# Patient Record
Sex: Female | Born: 1955
Health system: Southern US, Community
[De-identification: ages and names within clinical notes are randomized; demographics above are authoritative.]

## PROBLEM LIST (undated history)

## (undated) DIAGNOSIS — R011 Cardiac murmur, unspecified: Secondary | ICD-10-CM

## (undated) DIAGNOSIS — M1990 Unspecified osteoarthritis, unspecified site: Secondary | ICD-10-CM

## (undated) DIAGNOSIS — G2581 Restless legs syndrome: Secondary | ICD-10-CM

## (undated) DIAGNOSIS — M797 Fibromyalgia: Secondary | ICD-10-CM

## (undated) DIAGNOSIS — F32A Depression, unspecified: Secondary | ICD-10-CM

## (undated) DIAGNOSIS — Q279 Congenital malformation of peripheral vascular system, unspecified: Secondary | ICD-10-CM

## (undated) DIAGNOSIS — E7800 Pure hypercholesterolemia, unspecified: Secondary | ICD-10-CM

## (undated) DIAGNOSIS — E210 Primary hyperparathyroidism: Secondary | ICD-10-CM

## (undated) DIAGNOSIS — M4800 Spinal stenosis, site unspecified: Secondary | ICD-10-CM

## (undated) DIAGNOSIS — M7062 Trochanteric bursitis, left hip: Secondary | ICD-10-CM

## (undated) DIAGNOSIS — M47899 Other spondylosis, site unspecified: Secondary | ICD-10-CM

## (undated) DIAGNOSIS — M7061 Trochanteric bursitis, right hip: Secondary | ICD-10-CM

## (undated) DIAGNOSIS — E079 Disorder of thyroid, unspecified: Secondary | ICD-10-CM

## (undated) DIAGNOSIS — K589 Irritable bowel syndrome without diarrhea: Secondary | ICD-10-CM

## (undated) DIAGNOSIS — M810 Age-related osteoporosis without current pathological fracture: Secondary | ICD-10-CM

## (undated) DIAGNOSIS — J449 Chronic obstructive pulmonary disease, unspecified: Secondary | ICD-10-CM

## (undated) DIAGNOSIS — I639 Cerebral infarction, unspecified: Secondary | ICD-10-CM

## (undated) DIAGNOSIS — M5416 Radiculopathy, lumbar region: Secondary | ICD-10-CM

## (undated) DIAGNOSIS — K219 Gastro-esophageal reflux disease without esophagitis: Secondary | ICD-10-CM

## (undated) DIAGNOSIS — G5600 Carpal tunnel syndrome, unspecified upper limb: Secondary | ICD-10-CM

## (undated) DIAGNOSIS — M26609 Unspecified temporomandibular joint disorder, unspecified side: Secondary | ICD-10-CM

## (undated) DIAGNOSIS — K5909 Other constipation: Secondary | ICD-10-CM

## (undated) DIAGNOSIS — M47819 Spondylosis without myelopathy or radiculopathy, site unspecified: Secondary | ICD-10-CM

## (undated) DIAGNOSIS — F419 Anxiety disorder, unspecified: Secondary | ICD-10-CM

## (undated) DIAGNOSIS — E2740 Unspecified adrenocortical insufficiency: Secondary | ICD-10-CM

## (undated) HISTORY — DX: Trochanteric bursitis, right hip: M70.62

## (undated) HISTORY — DX: Gastro-esophageal reflux disease without esophagitis: K21.9

## (undated) HISTORY — PX: CHOLECYSTECTOMY: SHX55

## (undated) HISTORY — DX: Unspecified adrenocortical insufficiency: E27.40

## (undated) HISTORY — DX: Fibromyalgia: M79.7

## (undated) HISTORY — DX: Unspecified osteoarthritis, unspecified site: M19.90

## (undated) HISTORY — PX: CATARACT EXTRACTION: SUR2

## (undated) HISTORY — DX: Pure hypercholesterolemia, unspecified: E78.00

## (undated) HISTORY — DX: Congenital malformation of peripheral vascular system, unspecified: Q27.9

## (undated) HISTORY — DX: Chronic obstructive pulmonary disease, unspecified: J44.9

## (undated) HISTORY — PX: TONSILLECTOMY: SUR1361

## (undated) HISTORY — DX: Carpal tunnel syndrome, unspecified upper limb: G56.00

## (undated) HISTORY — DX: Radiculopathy, lumbar region: M54.16

## (undated) HISTORY — DX: Restless legs syndrome: G25.81

## (undated) HISTORY — PX: NECK SURGERY: SHX720

## (undated) HISTORY — DX: Spondylosis without myelopathy or radiculopathy, site unspecified: M47.819

## (undated) HISTORY — DX: Spinal stenosis, site unspecified: M48.00

## (undated) HISTORY — DX: Disorder of thyroid, unspecified: E07.9

## (undated) HISTORY — PX: TOTAL ABDOMINAL HYSTERECTOMY: SHX209

## (undated) HISTORY — DX: Other constipation: K59.09

## (undated) HISTORY — DX: Irritable bowel syndrome, unspecified: K58.9

## (undated) HISTORY — DX: Cerebral infarction, unspecified: I63.9

## (undated) HISTORY — DX: Trochanteric bursitis, right hip: M70.61

## (undated) HISTORY — DX: Unspecified temporomandibular joint disorder, unspecified side: M26.609

## (undated) HISTORY — DX: Age-related osteoporosis without current pathological fracture: M81.0

## (undated) HISTORY — DX: Cardiac murmur, unspecified: R01.1

## (undated) HISTORY — PX: APPENDECTOMY: SHX54

## (undated) HISTORY — DX: Other spondylosis, site unspecified: M47.899

## (undated) HISTORY — DX: Anxiety disorder, unspecified: F41.9

## (undated) HISTORY — DX: Primary hyperparathyroidism: E21.0

## (undated) HISTORY — PX: HEMORROIDECTOMY: SUR656

---

## 2002-03-10 ENCOUNTER — Encounter: Admission: RE | Admit: 2002-03-10 | Discharge: 2002-03-10 | Payer: Self-pay | Admitting: Unknown Physician Specialty

## 2002-03-10 ENCOUNTER — Encounter: Payer: Self-pay | Admitting: Unknown Physician Specialty

## 2002-10-24 ENCOUNTER — Encounter: Payer: Self-pay | Admitting: Unknown Physician Specialty

## 2002-10-24 ENCOUNTER — Encounter: Admission: RE | Admit: 2002-10-24 | Discharge: 2002-10-24 | Payer: Self-pay | Admitting: Unknown Physician Specialty

## 2004-04-24 ENCOUNTER — Ambulatory Visit (HOSPITAL_COMMUNITY): Admission: RE | Admit: 2004-04-24 | Discharge: 2004-04-25 | Payer: Self-pay | Admitting: Neurosurgery

## 2004-04-24 HISTORY — PX: OTHER SURGICAL HISTORY: SHX169

## 2004-05-04 ENCOUNTER — Encounter: Admission: RE | Admit: 2004-05-04 | Discharge: 2004-05-04 | Payer: Self-pay | Admitting: Neurosurgery

## 2004-06-08 ENCOUNTER — Encounter: Admission: RE | Admit: 2004-06-08 | Discharge: 2004-06-08 | Payer: Self-pay | Admitting: Neurosurgery

## 2004-06-29 ENCOUNTER — Encounter: Admission: RE | Admit: 2004-06-29 | Discharge: 2004-06-29 | Payer: Self-pay | Admitting: Neurosurgery

## 2004-07-13 ENCOUNTER — Encounter: Admission: RE | Admit: 2004-07-13 | Discharge: 2004-07-13 | Payer: Self-pay | Admitting: Neurosurgery

## 2004-11-06 ENCOUNTER — Encounter: Admission: RE | Admit: 2004-11-06 | Discharge: 2004-11-06 | Payer: Self-pay | Admitting: Neurosurgery

## 2005-01-03 ENCOUNTER — Ambulatory Visit (HOSPITAL_COMMUNITY): Admission: RE | Admit: 2005-01-03 | Discharge: 2005-01-03 | Payer: Self-pay | Admitting: Neurosurgery

## 2005-01-03 HISTORY — PX: LAMINECTOMY AND MICRODISCECTOMY LUMBAR SPINE: SHX1913

## 2005-03-02 ENCOUNTER — Observation Stay (HOSPITAL_COMMUNITY): Admission: RE | Admit: 2005-03-02 | Discharge: 2005-03-02 | Payer: Self-pay | Admitting: Neurosurgery

## 2005-04-12 ENCOUNTER — Ambulatory Visit (HOSPITAL_COMMUNITY): Admission: RE | Admit: 2005-04-12 | Discharge: 2005-04-12 | Payer: Self-pay | Admitting: Neurosurgery

## 2005-07-18 ENCOUNTER — Encounter: Admission: RE | Admit: 2005-07-18 | Discharge: 2005-07-18 | Payer: Self-pay | Admitting: Neurosurgery

## 2005-08-02 ENCOUNTER — Ambulatory Visit (HOSPITAL_COMMUNITY): Admission: RE | Admit: 2005-08-02 | Discharge: 2005-08-02 | Payer: Self-pay | Admitting: Neurosurgery

## 2006-02-16 IMAGING — CR DG CHEST 2V
2 series · 2 of 2 positions shown · non-contrast
Comparison: none

CLINICAL DATA: HNP.  Pre-op respiratory exam.  Dyspnea, COPD, cough. 
 CHEST - TWO VIEW:
 No comparison films available. 
 Mild peribronchial thickening is likely chronic.  The lungs are otherwise clear.  The cardiomediastinal silhouette is within normal limits.

[view not recorded (1 of 2)]
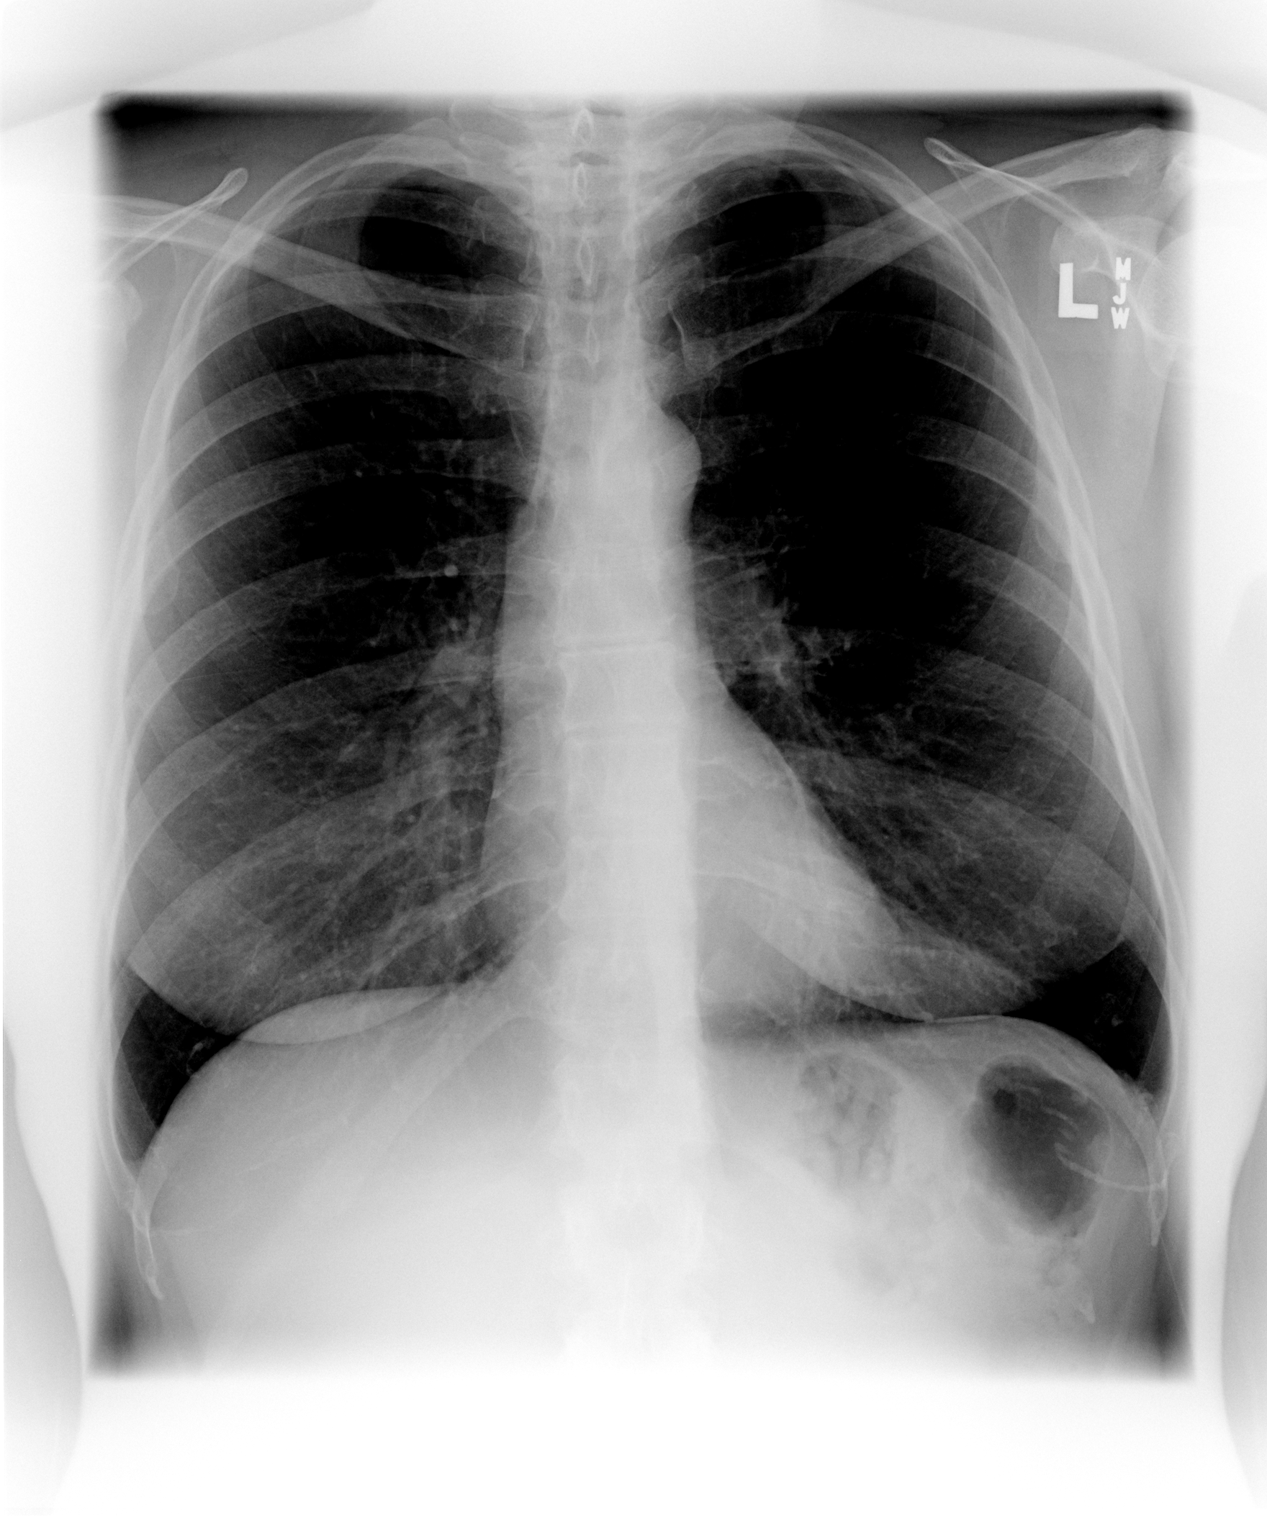

[view not recorded (2 of 2)]
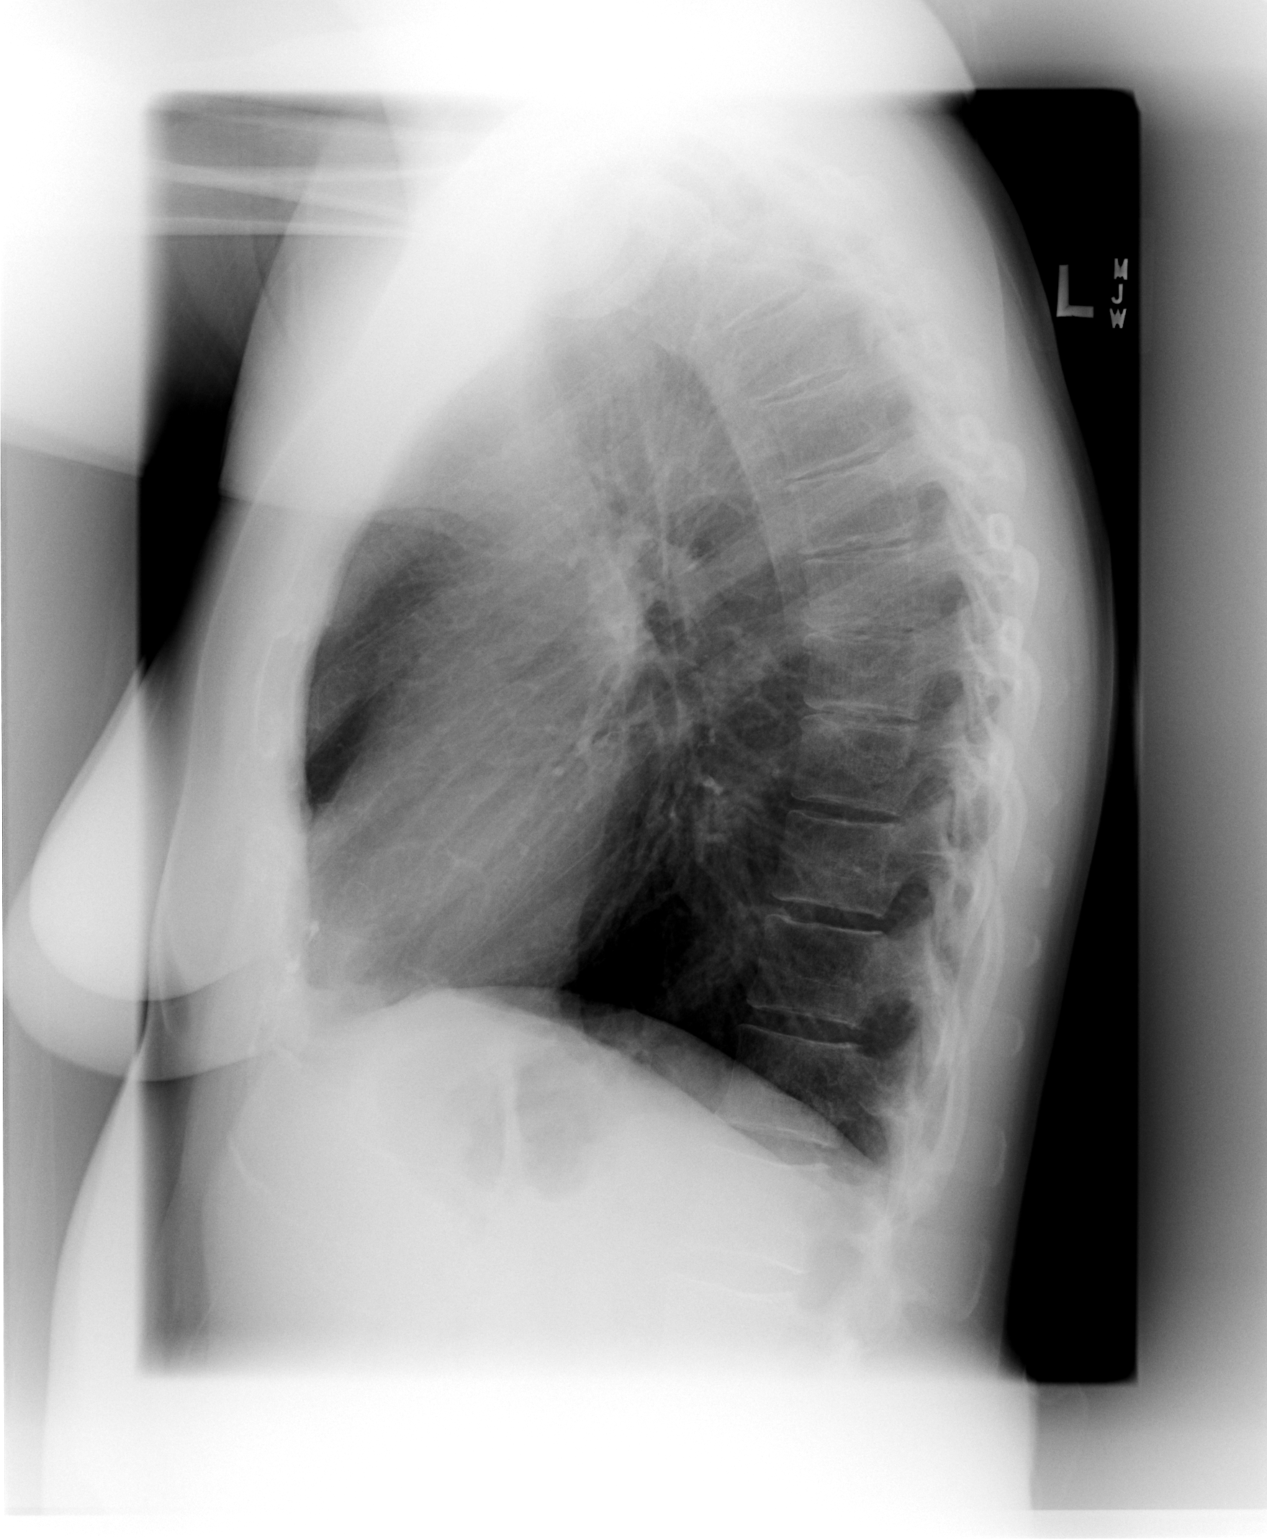

[2 of 2 positions shown; findings below may reference images not displayed]

IMPRESSION: Mild peribronchial thickening.

## 2007-04-28 HISTORY — PX: ESOPHAGOGASTRODUODENOSCOPY: SHX1529

## 2012-11-01 DIAGNOSIS — H539 Unspecified visual disturbance: Secondary | ICD-10-CM | POA: Insufficient documentation

## 2012-11-01 DIAGNOSIS — R0600 Dyspnea, unspecified: Secondary | ICD-10-CM | POA: Insufficient documentation

## 2012-11-01 DIAGNOSIS — E079 Disorder of thyroid, unspecified: Secondary | ICD-10-CM | POA: Insufficient documentation

## 2012-11-01 DIAGNOSIS — F4542 Pain disorder with related psychological factors: Secondary | ICD-10-CM | POA: Insufficient documentation

## 2012-11-01 DIAGNOSIS — M67919 Unspecified disorder of synovium and tendon, unspecified shoulder: Secondary | ICD-10-CM | POA: Insufficient documentation

## 2012-11-21 DIAGNOSIS — M545 Low back pain, unspecified: Secondary | ICD-10-CM | POA: Insufficient documentation

## 2013-01-21 DIAGNOSIS — M542 Cervicalgia: Secondary | ICD-10-CM | POA: Insufficient documentation

## 2013-03-19 DIAGNOSIS — M961 Postlaminectomy syndrome, not elsewhere classified: Secondary | ICD-10-CM | POA: Insufficient documentation

## 2013-06-04 DIAGNOSIS — G5602 Carpal tunnel syndrome, left upper limb: Secondary | ICD-10-CM | POA: Insufficient documentation

## 2013-08-06 DIAGNOSIS — F411 Generalized anxiety disorder: Secondary | ICD-10-CM | POA: Insufficient documentation

## 2015-02-18 DIAGNOSIS — Z0000 Encounter for general adult medical examination without abnormal findings: Secondary | ICD-10-CM | POA: Diagnosis not present

## 2015-02-18 DIAGNOSIS — Z1211 Encounter for screening for malignant neoplasm of colon: Secondary | ICD-10-CM | POA: Diagnosis not present

## 2015-02-18 DIAGNOSIS — Z1231 Encounter for screening mammogram for malignant neoplasm of breast: Secondary | ICD-10-CM | POA: Diagnosis not present

## 2015-02-18 DIAGNOSIS — Z23 Encounter for immunization: Secondary | ICD-10-CM | POA: Diagnosis not present

## 2015-03-24 DIAGNOSIS — G8929 Other chronic pain: Secondary | ICD-10-CM | POA: Diagnosis not present

## 2015-03-24 DIAGNOSIS — G894 Chronic pain syndrome: Secondary | ICD-10-CM | POA: Diagnosis not present

## 2015-03-24 DIAGNOSIS — M961 Postlaminectomy syndrome, not elsewhere classified: Secondary | ICD-10-CM | POA: Diagnosis not present

## 2015-03-24 DIAGNOSIS — R8299 Other abnormal findings in urine: Secondary | ICD-10-CM | POA: Diagnosis not present

## 2015-03-24 DIAGNOSIS — E034 Atrophy of thyroid (acquired): Secondary | ICD-10-CM | POA: Diagnosis not present

## 2015-03-24 DIAGNOSIS — R7301 Impaired fasting glucose: Secondary | ICD-10-CM | POA: Diagnosis not present

## 2015-03-24 DIAGNOSIS — J449 Chronic obstructive pulmonary disease, unspecified: Secondary | ICD-10-CM | POA: Diagnosis not present

## 2015-03-24 DIAGNOSIS — M545 Low back pain: Secondary | ICD-10-CM | POA: Diagnosis not present

## 2015-03-24 DIAGNOSIS — E7800 Pure hypercholesterolemia, unspecified: Secondary | ICD-10-CM | POA: Diagnosis not present

## 2015-04-07 DIAGNOSIS — R7301 Impaired fasting glucose: Secondary | ICD-10-CM | POA: Diagnosis not present

## 2015-04-07 DIAGNOSIS — E034 Atrophy of thyroid (acquired): Secondary | ICD-10-CM | POA: Diagnosis not present

## 2015-04-07 DIAGNOSIS — K1379 Other lesions of oral mucosa: Secondary | ICD-10-CM | POA: Diagnosis not present

## 2015-04-07 DIAGNOSIS — E782 Mixed hyperlipidemia: Secondary | ICD-10-CM | POA: Diagnosis not present

## 2015-06-15 DIAGNOSIS — J018 Other acute sinusitis: Secondary | ICD-10-CM | POA: Diagnosis not present

## 2015-06-29 DIAGNOSIS — M719 Bursopathy, unspecified: Secondary | ICD-10-CM | POA: Diagnosis not present

## 2015-06-29 DIAGNOSIS — M961 Postlaminectomy syndrome, not elsewhere classified: Secondary | ICD-10-CM | POA: Diagnosis not present

## 2015-06-29 DIAGNOSIS — M67919 Unspecified disorder of synovium and tendon, unspecified shoulder: Secondary | ICD-10-CM | POA: Diagnosis not present

## 2015-06-29 DIAGNOSIS — G894 Chronic pain syndrome: Secondary | ICD-10-CM | POA: Diagnosis not present

## 2015-07-21 DIAGNOSIS — R7301 Impaired fasting glucose: Secondary | ICD-10-CM | POA: Diagnosis not present

## 2015-07-21 DIAGNOSIS — M797 Fibromyalgia: Secondary | ICD-10-CM | POA: Diagnosis not present

## 2015-07-21 DIAGNOSIS — E782 Mixed hyperlipidemia: Secondary | ICD-10-CM | POA: Diagnosis not present

## 2015-07-21 DIAGNOSIS — J410 Simple chronic bronchitis: Secondary | ICD-10-CM | POA: Diagnosis not present

## 2015-07-21 DIAGNOSIS — M791 Myalgia: Secondary | ICD-10-CM | POA: Diagnosis not present

## 2015-07-21 DIAGNOSIS — I10 Essential (primary) hypertension: Secondary | ICD-10-CM | POA: Diagnosis not present

## 2015-09-15 DIAGNOSIS — Z1231 Encounter for screening mammogram for malignant neoplasm of breast: Secondary | ICD-10-CM | POA: Diagnosis not present

## 2015-09-22 DIAGNOSIS — E2740 Unspecified adrenocortical insufficiency: Secondary | ICD-10-CM | POA: Insufficient documentation

## 2015-09-22 DIAGNOSIS — S51811A Laceration without foreign body of right forearm, initial encounter: Secondary | ICD-10-CM | POA: Diagnosis not present

## 2015-09-22 DIAGNOSIS — E039 Hypothyroidism, unspecified: Secondary | ICD-10-CM | POA: Insufficient documentation

## 2015-09-22 DIAGNOSIS — E559 Vitamin D deficiency, unspecified: Secondary | ICD-10-CM | POA: Insufficient documentation

## 2015-09-22 DIAGNOSIS — E213 Hyperparathyroidism, unspecified: Secondary | ICD-10-CM | POA: Insufficient documentation

## 2015-09-23 DIAGNOSIS — E2740 Unspecified adrenocortical insufficiency: Secondary | ICD-10-CM | POA: Diagnosis not present

## 2015-09-23 DIAGNOSIS — E559 Vitamin D deficiency, unspecified: Secondary | ICD-10-CM | POA: Diagnosis not present

## 2015-09-23 DIAGNOSIS — E039 Hypothyroidism, unspecified: Secondary | ICD-10-CM | POA: Diagnosis not present

## 2015-09-23 DIAGNOSIS — E034 Atrophy of thyroid (acquired): Secondary | ICD-10-CM | POA: Diagnosis not present

## 2015-09-23 DIAGNOSIS — E213 Hyperparathyroidism, unspecified: Secondary | ICD-10-CM | POA: Diagnosis not present

## 2015-10-05 DIAGNOSIS — G894 Chronic pain syndrome: Secondary | ICD-10-CM | POA: Diagnosis not present

## 2015-10-05 DIAGNOSIS — M67919 Unspecified disorder of synovium and tendon, unspecified shoulder: Secondary | ICD-10-CM | POA: Diagnosis not present

## 2015-10-05 DIAGNOSIS — M719 Bursopathy, unspecified: Secondary | ICD-10-CM | POA: Diagnosis not present

## 2015-10-05 DIAGNOSIS — M545 Low back pain: Secondary | ICD-10-CM | POA: Diagnosis not present

## 2015-10-05 DIAGNOSIS — M961 Postlaminectomy syndrome, not elsewhere classified: Secondary | ICD-10-CM | POA: Diagnosis not present

## 2015-11-14 DIAGNOSIS — H5203 Hypermetropia, bilateral: Secondary | ICD-10-CM | POA: Diagnosis not present

## 2015-11-17 DIAGNOSIS — S335XXA Sprain of ligaments of lumbar spine, initial encounter: Secondary | ICD-10-CM | POA: Diagnosis not present

## 2015-11-17 DIAGNOSIS — M545 Low back pain: Secondary | ICD-10-CM | POA: Diagnosis not present

## 2015-11-17 DIAGNOSIS — S3992XA Unspecified injury of lower back, initial encounter: Secondary | ICD-10-CM | POA: Diagnosis not present

## 2015-12-01 DIAGNOSIS — K921 Melena: Secondary | ICD-10-CM | POA: Diagnosis not present

## 2015-12-01 DIAGNOSIS — R1033 Periumbilical pain: Secondary | ICD-10-CM | POA: Diagnosis not present

## 2015-12-01 DIAGNOSIS — R10811 Right upper quadrant abdominal tenderness: Secondary | ICD-10-CM | POA: Diagnosis not present

## 2015-12-01 DIAGNOSIS — R109 Unspecified abdominal pain: Secondary | ICD-10-CM | POA: Diagnosis not present

## 2015-12-01 DIAGNOSIS — R1012 Left upper quadrant pain: Secondary | ICD-10-CM | POA: Diagnosis not present

## 2015-12-01 DIAGNOSIS — M545 Low back pain: Secondary | ICD-10-CM | POA: Diagnosis not present

## 2015-12-02 DIAGNOSIS — R109 Unspecified abdominal pain: Secondary | ICD-10-CM | POA: Diagnosis not present

## 2015-12-12 DIAGNOSIS — R42 Dizziness and giddiness: Secondary | ICD-10-CM | POA: Diagnosis not present

## 2015-12-12 DIAGNOSIS — R1012 Left upper quadrant pain: Secondary | ICD-10-CM | POA: Diagnosis not present

## 2015-12-13 DIAGNOSIS — H6992 Unspecified Eustachian tube disorder, left ear: Secondary | ICD-10-CM | POA: Diagnosis not present

## 2015-12-13 DIAGNOSIS — H9202 Otalgia, left ear: Secondary | ICD-10-CM | POA: Diagnosis not present

## 2015-12-13 DIAGNOSIS — H90A32 Mixed conductive and sensorineural hearing loss, unilateral, left ear with restricted hearing on the contralateral side: Secondary | ICD-10-CM | POA: Diagnosis not present

## 2016-01-05 DIAGNOSIS — M545 Low back pain: Secondary | ICD-10-CM | POA: Diagnosis not present

## 2016-01-05 DIAGNOSIS — M719 Bursopathy, unspecified: Secondary | ICD-10-CM | POA: Diagnosis not present

## 2016-01-05 DIAGNOSIS — G5600 Carpal tunnel syndrome, unspecified upper limb: Secondary | ICD-10-CM | POA: Diagnosis not present

## 2016-01-05 DIAGNOSIS — M961 Postlaminectomy syndrome, not elsewhere classified: Secondary | ICD-10-CM | POA: Diagnosis not present

## 2016-01-05 DIAGNOSIS — G894 Chronic pain syndrome: Secondary | ICD-10-CM | POA: Diagnosis not present

## 2016-01-11 DIAGNOSIS — H9202 Otalgia, left ear: Secondary | ICD-10-CM | POA: Diagnosis not present

## 2016-01-11 DIAGNOSIS — H90A32 Mixed conductive and sensorineural hearing loss, unilateral, left ear with restricted hearing on the contralateral side: Secondary | ICD-10-CM | POA: Diagnosis not present

## 2016-01-11 DIAGNOSIS — H6992 Unspecified Eustachian tube disorder, left ear: Secondary | ICD-10-CM | POA: Diagnosis not present

## 2016-01-12 DIAGNOSIS — R1013 Epigastric pain: Secondary | ICD-10-CM | POA: Diagnosis not present

## 2016-03-15 DIAGNOSIS — J410 Simple chronic bronchitis: Secondary | ICD-10-CM | POA: Diagnosis not present

## 2016-03-15 DIAGNOSIS — M797 Fibromyalgia: Secondary | ICD-10-CM | POA: Diagnosis not present

## 2016-03-15 DIAGNOSIS — M25511 Pain in right shoulder: Secondary | ICD-10-CM | POA: Diagnosis not present

## 2016-03-15 DIAGNOSIS — R7301 Impaired fasting glucose: Secondary | ICD-10-CM | POA: Diagnosis not present

## 2016-03-15 DIAGNOSIS — E782 Mixed hyperlipidemia: Secondary | ICD-10-CM | POA: Diagnosis not present

## 2016-04-05 DIAGNOSIS — M961 Postlaminectomy syndrome, not elsewhere classified: Secondary | ICD-10-CM | POA: Diagnosis not present

## 2016-04-05 DIAGNOSIS — G894 Chronic pain syndrome: Secondary | ICD-10-CM | POA: Diagnosis not present

## 2016-04-05 DIAGNOSIS — M542 Cervicalgia: Secondary | ICD-10-CM | POA: Diagnosis not present

## 2016-04-05 DIAGNOSIS — M719 Bursopathy, unspecified: Secondary | ICD-10-CM | POA: Diagnosis not present

## 2016-04-05 DIAGNOSIS — M545 Low back pain: Secondary | ICD-10-CM | POA: Diagnosis not present

## 2016-04-10 DIAGNOSIS — M25511 Pain in right shoulder: Secondary | ICD-10-CM | POA: Diagnosis not present

## 2016-04-10 DIAGNOSIS — M19011 Primary osteoarthritis, right shoulder: Secondary | ICD-10-CM | POA: Diagnosis not present

## 2016-04-10 DIAGNOSIS — S43421A Sprain of right rotator cuff capsule, initial encounter: Secondary | ICD-10-CM | POA: Diagnosis not present

## 2016-04-11 DIAGNOSIS — Z0000 Encounter for general adult medical examination without abnormal findings: Secondary | ICD-10-CM | POA: Diagnosis not present

## 2016-04-11 DIAGNOSIS — N951 Menopausal and female climacteric states: Secondary | ICD-10-CM | POA: Diagnosis not present

## 2016-04-11 DIAGNOSIS — Z1211 Encounter for screening for malignant neoplasm of colon: Secondary | ICD-10-CM | POA: Diagnosis not present

## 2016-04-25 DIAGNOSIS — G5603 Carpal tunnel syndrome, bilateral upper limbs: Secondary | ICD-10-CM | POA: Diagnosis not present

## 2016-04-25 DIAGNOSIS — M7541 Impingement syndrome of right shoulder: Secondary | ICD-10-CM | POA: Diagnosis not present

## 2016-05-03 DIAGNOSIS — H9202 Otalgia, left ear: Secondary | ICD-10-CM | POA: Diagnosis not present

## 2016-05-03 DIAGNOSIS — H6992 Unspecified Eustachian tube disorder, left ear: Secondary | ICD-10-CM | POA: Diagnosis not present

## 2016-05-03 DIAGNOSIS — H60332 Swimmer's ear, left ear: Secondary | ICD-10-CM | POA: Diagnosis not present

## 2016-05-14 DIAGNOSIS — M810 Age-related osteoporosis without current pathological fracture: Secondary | ICD-10-CM | POA: Diagnosis not present

## 2016-05-14 DIAGNOSIS — M818 Other osteoporosis without current pathological fracture: Secondary | ICD-10-CM | POA: Diagnosis not present

## 2016-05-14 DIAGNOSIS — N959 Unspecified menopausal and perimenopausal disorder: Secondary | ICD-10-CM | POA: Diagnosis not present

## 2016-05-14 LAB — HM DEXA SCAN

## 2016-05-15 DIAGNOSIS — H73812 Atrophic flaccid tympanic membrane, left ear: Secondary | ICD-10-CM | POA: Diagnosis not present

## 2016-05-15 DIAGNOSIS — H90A32 Mixed conductive and sensorineural hearing loss, unilateral, left ear with restricted hearing on the contralateral side: Secondary | ICD-10-CM | POA: Diagnosis not present

## 2016-05-15 DIAGNOSIS — H6992 Unspecified Eustachian tube disorder, left ear: Secondary | ICD-10-CM | POA: Diagnosis not present

## 2016-06-21 DIAGNOSIS — E782 Mixed hyperlipidemia: Secondary | ICD-10-CM | POA: Diagnosis not present

## 2016-06-21 DIAGNOSIS — I10 Essential (primary) hypertension: Secondary | ICD-10-CM | POA: Diagnosis not present

## 2016-06-21 DIAGNOSIS — J410 Simple chronic bronchitis: Secondary | ICD-10-CM | POA: Diagnosis not present

## 2016-06-21 DIAGNOSIS — M797 Fibromyalgia: Secondary | ICD-10-CM | POA: Diagnosis not present

## 2016-06-27 DIAGNOSIS — S46011A Strain of muscle(s) and tendon(s) of the rotator cuff of right shoulder, initial encounter: Secondary | ICD-10-CM | POA: Diagnosis not present

## 2016-06-27 DIAGNOSIS — M25511 Pain in right shoulder: Secondary | ICD-10-CM | POA: Diagnosis not present

## 2016-07-05 DIAGNOSIS — M67919 Unspecified disorder of synovium and tendon, unspecified shoulder: Secondary | ICD-10-CM | POA: Diagnosis not present

## 2016-07-05 DIAGNOSIS — M961 Postlaminectomy syndrome, not elsewhere classified: Secondary | ICD-10-CM | POA: Diagnosis not present

## 2016-07-05 DIAGNOSIS — G894 Chronic pain syndrome: Secondary | ICD-10-CM | POA: Diagnosis not present

## 2016-07-05 DIAGNOSIS — M719 Bursopathy, unspecified: Secondary | ICD-10-CM | POA: Diagnosis not present

## 2016-07-09 DIAGNOSIS — M7501 Adhesive capsulitis of right shoulder: Secondary | ICD-10-CM | POA: Diagnosis not present

## 2016-07-09 DIAGNOSIS — G5601 Carpal tunnel syndrome, right upper limb: Secondary | ICD-10-CM | POA: Diagnosis not present

## 2016-09-13 DIAGNOSIS — H90A32 Mixed conductive and sensorineural hearing loss, unilateral, left ear with restricted hearing on the contralateral side: Secondary | ICD-10-CM | POA: Diagnosis not present

## 2016-09-13 DIAGNOSIS — H73812 Atrophic flaccid tympanic membrane, left ear: Secondary | ICD-10-CM | POA: Diagnosis not present

## 2016-09-13 DIAGNOSIS — H6992 Unspecified Eustachian tube disorder, left ear: Secondary | ICD-10-CM | POA: Diagnosis not present

## 2016-09-27 DIAGNOSIS — M545 Low back pain: Secondary | ICD-10-CM | POA: Diagnosis not present

## 2016-09-27 DIAGNOSIS — G5600 Carpal tunnel syndrome, unspecified upper limb: Secondary | ICD-10-CM | POA: Diagnosis not present

## 2016-09-27 DIAGNOSIS — G8929 Other chronic pain: Secondary | ICD-10-CM | POA: Diagnosis not present

## 2016-09-27 DIAGNOSIS — G894 Chronic pain syndrome: Secondary | ICD-10-CM | POA: Diagnosis not present

## 2016-09-27 DIAGNOSIS — M961 Postlaminectomy syndrome, not elsewhere classified: Secondary | ICD-10-CM | POA: Diagnosis not present

## 2016-10-04 DIAGNOSIS — Z09 Encounter for follow-up examination after completed treatment for conditions other than malignant neoplasm: Secondary | ICD-10-CM | POA: Diagnosis not present

## 2016-10-17 DIAGNOSIS — E782 Mixed hyperlipidemia: Secondary | ICD-10-CM | POA: Diagnosis not present

## 2016-10-17 DIAGNOSIS — E038 Other specified hypothyroidism: Secondary | ICD-10-CM | POA: Diagnosis not present

## 2016-10-17 DIAGNOSIS — R7301 Impaired fasting glucose: Secondary | ICD-10-CM | POA: Diagnosis not present

## 2016-10-17 DIAGNOSIS — K580 Irritable bowel syndrome with diarrhea: Secondary | ICD-10-CM | POA: Diagnosis not present

## 2016-10-17 DIAGNOSIS — M810 Age-related osteoporosis without current pathological fracture: Secondary | ICD-10-CM | POA: Diagnosis not present

## 2016-10-30 DIAGNOSIS — G5601 Carpal tunnel syndrome, right upper limb: Secondary | ICD-10-CM | POA: Diagnosis not present

## 2016-11-08 DIAGNOSIS — H90A32 Mixed conductive and sensorineural hearing loss, unilateral, left ear with restricted hearing on the contralateral side: Secondary | ICD-10-CM | POA: Diagnosis not present

## 2016-11-08 DIAGNOSIS — H6992 Unspecified Eustachian tube disorder, left ear: Secondary | ICD-10-CM | POA: Diagnosis not present

## 2016-11-14 DIAGNOSIS — K5903 Drug induced constipation: Secondary | ICD-10-CM | POA: Diagnosis not present

## 2016-11-14 DIAGNOSIS — J441 Chronic obstructive pulmonary disease with (acute) exacerbation: Secondary | ICD-10-CM | POA: Diagnosis not present

## 2016-11-14 DIAGNOSIS — R0602 Shortness of breath: Secondary | ICD-10-CM | POA: Diagnosis not present

## 2016-11-14 DIAGNOSIS — R05 Cough: Secondary | ICD-10-CM | POA: Diagnosis not present

## 2016-11-14 DIAGNOSIS — I700 Atherosclerosis of aorta: Secondary | ICD-10-CM | POA: Diagnosis not present

## 2017-01-02 DIAGNOSIS — M67919 Unspecified disorder of synovium and tendon, unspecified shoulder: Secondary | ICD-10-CM | POA: Diagnosis not present

## 2017-01-02 DIAGNOSIS — M719 Bursopathy, unspecified: Secondary | ICD-10-CM | POA: Diagnosis not present

## 2017-01-02 DIAGNOSIS — G894 Chronic pain syndrome: Secondary | ICD-10-CM | POA: Diagnosis not present

## 2017-01-02 DIAGNOSIS — M961 Postlaminectomy syndrome, not elsewhere classified: Secondary | ICD-10-CM | POA: Diagnosis not present

## 2017-01-18 DIAGNOSIS — M810 Age-related osteoporosis without current pathological fracture: Secondary | ICD-10-CM | POA: Insufficient documentation

## 2017-01-18 DIAGNOSIS — E039 Hypothyroidism, unspecified: Secondary | ICD-10-CM | POA: Diagnosis not present

## 2017-01-18 DIAGNOSIS — E559 Vitamin D deficiency, unspecified: Secondary | ICD-10-CM | POA: Diagnosis not present

## 2017-01-18 DIAGNOSIS — E213 Hyperparathyroidism, unspecified: Secondary | ICD-10-CM | POA: Diagnosis not present

## 2017-01-18 DIAGNOSIS — E2740 Unspecified adrenocortical insufficiency: Secondary | ICD-10-CM | POA: Diagnosis not present

## 2017-01-24 DIAGNOSIS — N3000 Acute cystitis without hematuria: Secondary | ICD-10-CM | POA: Diagnosis not present

## 2017-01-24 DIAGNOSIS — R0789 Other chest pain: Secondary | ICD-10-CM | POA: Diagnosis not present

## 2017-01-24 DIAGNOSIS — R350 Frequency of micturition: Secondary | ICD-10-CM | POA: Diagnosis not present

## 2017-02-14 DIAGNOSIS — E782 Mixed hyperlipidemia: Secondary | ICD-10-CM | POA: Diagnosis not present

## 2017-02-19 DIAGNOSIS — R079 Chest pain, unspecified: Secondary | ICD-10-CM | POA: Diagnosis not present

## 2017-02-19 DIAGNOSIS — R0789 Other chest pain: Secondary | ICD-10-CM | POA: Diagnosis not present

## 2017-03-07 LAB — HM MAMMOGRAPHY: HM Mammogram: NORMAL (ref 0–4)

## 2017-03-21 DIAGNOSIS — F331 Major depressive disorder, recurrent, moderate: Secondary | ICD-10-CM | POA: Insufficient documentation

## 2017-03-21 DIAGNOSIS — F418 Other specified anxiety disorders: Secondary | ICD-10-CM | POA: Insufficient documentation

## 2017-03-21 DIAGNOSIS — F330 Major depressive disorder, recurrent, mild: Secondary | ICD-10-CM | POA: Insufficient documentation

## 2017-04-15 DIAGNOSIS — M961 Postlaminectomy syndrome, not elsewhere classified: Secondary | ICD-10-CM | POA: Insufficient documentation

## 2017-04-15 DIAGNOSIS — M12811 Other specific arthropathies, not elsewhere classified, right shoulder: Secondary | ICD-10-CM | POA: Diagnosis not present

## 2017-04-15 DIAGNOSIS — G5603 Carpal tunnel syndrome, bilateral upper limbs: Secondary | ICD-10-CM | POA: Diagnosis not present

## 2017-04-15 DIAGNOSIS — G894 Chronic pain syndrome: Secondary | ICD-10-CM | POA: Insufficient documentation

## 2017-05-01 DIAGNOSIS — Z8673 Personal history of transient ischemic attack (TIA), and cerebral infarction without residual deficits: Secondary | ICD-10-CM | POA: Diagnosis not present

## 2017-05-01 DIAGNOSIS — K623 Rectal prolapse: Secondary | ICD-10-CM | POA: Diagnosis not present

## 2017-05-01 DIAGNOSIS — K644 Residual hemorrhoidal skin tags: Secondary | ICD-10-CM | POA: Diagnosis not present

## 2017-05-01 DIAGNOSIS — E7800 Pure hypercholesterolemia, unspecified: Secondary | ICD-10-CM | POA: Diagnosis not present

## 2017-05-01 DIAGNOSIS — K581 Irritable bowel syndrome with constipation: Secondary | ICD-10-CM | POA: Diagnosis not present

## 2017-05-01 DIAGNOSIS — Z7951 Long term (current) use of inhaled steroids: Secondary | ICD-10-CM | POA: Diagnosis not present

## 2017-05-01 DIAGNOSIS — D128 Benign neoplasm of rectum: Secondary | ICD-10-CM | POA: Diagnosis not present

## 2017-05-01 DIAGNOSIS — Z79891 Long term (current) use of opiate analgesic: Secondary | ICD-10-CM | POA: Diagnosis not present

## 2017-05-01 DIAGNOSIS — Z79899 Other long term (current) drug therapy: Secondary | ICD-10-CM | POA: Diagnosis not present

## 2017-05-01 DIAGNOSIS — E2740 Unspecified adrenocortical insufficiency: Secondary | ICD-10-CM | POA: Diagnosis not present

## 2017-05-01 DIAGNOSIS — E039 Hypothyroidism, unspecified: Secondary | ICD-10-CM | POA: Diagnosis not present

## 2017-05-01 DIAGNOSIS — K648 Other hemorrhoids: Secondary | ICD-10-CM | POA: Diagnosis not present

## 2017-05-01 DIAGNOSIS — K219 Gastro-esophageal reflux disease without esophagitis: Secondary | ICD-10-CM | POA: Diagnosis not present

## 2017-05-01 DIAGNOSIS — K5909 Other constipation: Secondary | ICD-10-CM | POA: Diagnosis not present

## 2017-05-01 DIAGNOSIS — M797 Fibromyalgia: Secondary | ICD-10-CM | POA: Diagnosis not present

## 2017-05-01 DIAGNOSIS — G2581 Restless legs syndrome: Secondary | ICD-10-CM | POA: Diagnosis not present

## 2017-05-01 DIAGNOSIS — K635 Polyp of colon: Secondary | ICD-10-CM | POA: Diagnosis not present

## 2017-05-01 DIAGNOSIS — K5730 Diverticulosis of large intestine without perforation or abscess without bleeding: Secondary | ICD-10-CM | POA: Diagnosis not present

## 2017-05-01 DIAGNOSIS — J449 Chronic obstructive pulmonary disease, unspecified: Secondary | ICD-10-CM | POA: Diagnosis not present

## 2017-05-01 DIAGNOSIS — G894 Chronic pain syndrome: Secondary | ICD-10-CM | POA: Diagnosis not present

## 2017-05-01 DIAGNOSIS — K621 Rectal polyp: Secondary | ICD-10-CM | POA: Diagnosis not present

## 2017-05-01 DIAGNOSIS — D122 Benign neoplasm of ascending colon: Secondary | ICD-10-CM | POA: Diagnosis not present

## 2017-05-06 HISTORY — PX: COLONOSCOPY: SHX174

## 2017-05-09 DIAGNOSIS — Z0000 Encounter for general adult medical examination without abnormal findings: Secondary | ICD-10-CM | POA: Diagnosis not present

## 2017-05-09 DIAGNOSIS — Z6821 Body mass index (BMI) 21.0-21.9, adult: Secondary | ICD-10-CM | POA: Diagnosis not present

## 2017-05-29 DIAGNOSIS — H9313 Tinnitus, bilateral: Secondary | ICD-10-CM | POA: Diagnosis not present

## 2017-05-29 DIAGNOSIS — H903 Sensorineural hearing loss, bilateral: Secondary | ICD-10-CM | POA: Diagnosis not present

## 2017-05-29 DIAGNOSIS — H73812 Atrophic flaccid tympanic membrane, left ear: Secondary | ICD-10-CM | POA: Diagnosis not present

## 2017-07-02 DIAGNOSIS — K580 Irritable bowel syndrome with diarrhea: Secondary | ICD-10-CM | POA: Diagnosis not present

## 2017-07-02 DIAGNOSIS — M810 Age-related osteoporosis without current pathological fracture: Secondary | ICD-10-CM | POA: Diagnosis not present

## 2017-07-02 DIAGNOSIS — N3000 Acute cystitis without hematuria: Secondary | ICD-10-CM | POA: Diagnosis not present

## 2017-07-02 DIAGNOSIS — E038 Other specified hypothyroidism: Secondary | ICD-10-CM | POA: Diagnosis not present

## 2017-07-02 DIAGNOSIS — E782 Mixed hyperlipidemia: Secondary | ICD-10-CM | POA: Diagnosis not present

## 2017-07-02 DIAGNOSIS — R7301 Impaired fasting glucose: Secondary | ICD-10-CM | POA: Diagnosis not present

## 2017-07-15 DIAGNOSIS — M961 Postlaminectomy syndrome, not elsewhere classified: Secondary | ICD-10-CM | POA: Diagnosis not present

## 2017-07-15 DIAGNOSIS — G894 Chronic pain syndrome: Secondary | ICD-10-CM | POA: Diagnosis not present

## 2017-07-15 DIAGNOSIS — G5603 Carpal tunnel syndrome, bilateral upper limbs: Secondary | ICD-10-CM | POA: Diagnosis not present

## 2017-07-31 DIAGNOSIS — H903 Sensorineural hearing loss, bilateral: Secondary | ICD-10-CM | POA: Diagnosis not present

## 2017-07-31 DIAGNOSIS — H9313 Tinnitus, bilateral: Secondary | ICD-10-CM | POA: Diagnosis not present

## 2017-07-31 DIAGNOSIS — H73812 Atrophic flaccid tympanic membrane, left ear: Secondary | ICD-10-CM | POA: Diagnosis not present

## 2017-08-28 ENCOUNTER — Telehealth: Payer: Self-pay | Admitting: Gastroenterology

## 2017-08-29 NOTE — Telephone Encounter (Signed)
Let's get her in for colon (with 2 day prep - Clenpiq and miralax) in April or May 2019. Pl have her go thru the nurse to make sure she meets criterion for [place]

## 2017-08-29 NOTE — Telephone Encounter (Signed)
Dr Lyndel Safe, please advisee.  Thank you.

## 2017-08-30 NOTE — Telephone Encounter (Signed)
I did speak with patient and she indicates that her insurance will only cover 1 colonoscopy per year, saying that she would not be eligible for a repeat scope until December 2019.  She is going to contact your old office to get a copy of the report and then call her insurance to verify her coverage.

## 2017-09-03 NOTE — Telephone Encounter (Signed)
Lets plan for dec then

## 2017-09-04 NOTE — Telephone Encounter (Signed)
Recall in Epic  

## 2017-09-24 DIAGNOSIS — M25531 Pain in right wrist: Secondary | ICD-10-CM | POA: Diagnosis not present

## 2017-10-01 DIAGNOSIS — E038 Other specified hypothyroidism: Secondary | ICD-10-CM | POA: Diagnosis not present

## 2017-10-01 DIAGNOSIS — R7301 Impaired fasting glucose: Secondary | ICD-10-CM | POA: Diagnosis not present

## 2017-10-01 DIAGNOSIS — E210 Primary hyperparathyroidism: Secondary | ICD-10-CM | POA: Diagnosis not present

## 2017-10-01 DIAGNOSIS — E782 Mixed hyperlipidemia: Secondary | ICD-10-CM | POA: Diagnosis not present

## 2017-10-02 DIAGNOSIS — M542 Cervicalgia: Secondary | ICD-10-CM | POA: Diagnosis not present

## 2017-10-02 DIAGNOSIS — G452 Multiple and bilateral precerebral artery syndromes: Secondary | ICD-10-CM | POA: Diagnosis not present

## 2017-10-02 DIAGNOSIS — M25512 Pain in left shoulder: Secondary | ICD-10-CM | POA: Diagnosis not present

## 2017-10-09 DIAGNOSIS — M961 Postlaminectomy syndrome, not elsewhere classified: Secondary | ICD-10-CM | POA: Diagnosis not present

## 2017-10-09 DIAGNOSIS — G894 Chronic pain syndrome: Secondary | ICD-10-CM | POA: Diagnosis not present

## 2017-10-10 DIAGNOSIS — M7501 Adhesive capsulitis of right shoulder: Secondary | ICD-10-CM | POA: Diagnosis not present

## 2017-10-10 DIAGNOSIS — G5601 Carpal tunnel syndrome, right upper limb: Secondary | ICD-10-CM | POA: Diagnosis not present

## 2017-10-17 DIAGNOSIS — G5603 Carpal tunnel syndrome, bilateral upper limbs: Secondary | ICD-10-CM | POA: Diagnosis not present

## 2017-10-22 DIAGNOSIS — J0100 Acute maxillary sinusitis, unspecified: Secondary | ICD-10-CM | POA: Diagnosis not present

## 2017-10-22 DIAGNOSIS — J301 Allergic rhinitis due to pollen: Secondary | ICD-10-CM | POA: Diagnosis not present

## 2017-10-22 DIAGNOSIS — J441 Chronic obstructive pulmonary disease with (acute) exacerbation: Secondary | ICD-10-CM | POA: Diagnosis not present

## 2017-10-28 DIAGNOSIS — M542 Cervicalgia: Secondary | ICD-10-CM | POA: Diagnosis not present

## 2017-10-31 DIAGNOSIS — E782 Mixed hyperlipidemia: Secondary | ICD-10-CM | POA: Diagnosis not present

## 2017-10-31 DIAGNOSIS — M5412 Radiculopathy, cervical region: Secondary | ICD-10-CM | POA: Diagnosis not present

## 2017-10-31 DIAGNOSIS — Z01810 Encounter for preprocedural cardiovascular examination: Secondary | ICD-10-CM | POA: Diagnosis not present

## 2017-10-31 DIAGNOSIS — R0789 Other chest pain: Secondary | ICD-10-CM | POA: Diagnosis not present

## 2017-10-31 DIAGNOSIS — R7301 Impaired fasting glucose: Secondary | ICD-10-CM | POA: Diagnosis not present

## 2017-10-31 DIAGNOSIS — E210 Primary hyperparathyroidism: Secondary | ICD-10-CM | POA: Diagnosis not present

## 2017-10-31 DIAGNOSIS — J410 Simple chronic bronchitis: Secondary | ICD-10-CM | POA: Diagnosis not present

## 2017-11-13 DIAGNOSIS — J410 Simple chronic bronchitis: Secondary | ICD-10-CM | POA: Diagnosis not present

## 2017-11-20 DIAGNOSIS — F411 Generalized anxiety disorder: Secondary | ICD-10-CM | POA: Insufficient documentation

## 2017-11-20 DIAGNOSIS — F320 Major depressive disorder, single episode, mild: Secondary | ICD-10-CM | POA: Insufficient documentation

## 2017-11-21 DIAGNOSIS — M5412 Radiculopathy, cervical region: Secondary | ICD-10-CM | POA: Diagnosis not present

## 2017-12-11 DIAGNOSIS — H903 Sensorineural hearing loss, bilateral: Secondary | ICD-10-CM | POA: Diagnosis not present

## 2017-12-11 DIAGNOSIS — H9313 Tinnitus, bilateral: Secondary | ICD-10-CM | POA: Diagnosis not present

## 2017-12-11 DIAGNOSIS — H73812 Atrophic flaccid tympanic membrane, left ear: Secondary | ICD-10-CM | POA: Diagnosis not present

## 2017-12-12 ENCOUNTER — Telehealth: Payer: Self-pay | Admitting: Gastroenterology

## 2017-12-13 DIAGNOSIS — Z79891 Long term (current) use of opiate analgesic: Secondary | ICD-10-CM | POA: Diagnosis not present

## 2017-12-13 DIAGNOSIS — J449 Chronic obstructive pulmonary disease, unspecified: Secondary | ICD-10-CM | POA: Diagnosis not present

## 2017-12-13 DIAGNOSIS — G8929 Other chronic pain: Secondary | ICD-10-CM | POA: Diagnosis not present

## 2017-12-13 DIAGNOSIS — E039 Hypothyroidism, unspecified: Secondary | ICD-10-CM | POA: Diagnosis not present

## 2017-12-13 DIAGNOSIS — G5601 Carpal tunnel syndrome, right upper limb: Secondary | ICD-10-CM | POA: Diagnosis not present

## 2017-12-13 DIAGNOSIS — K219 Gastro-esophageal reflux disease without esophagitis: Secondary | ICD-10-CM | POA: Diagnosis not present

## 2017-12-13 DIAGNOSIS — Z8711 Personal history of peptic ulcer disease: Secondary | ICD-10-CM | POA: Diagnosis not present

## 2017-12-13 DIAGNOSIS — Z7951 Long term (current) use of inhaled steroids: Secondary | ICD-10-CM | POA: Diagnosis not present

## 2017-12-13 DIAGNOSIS — E210 Primary hyperparathyroidism: Secondary | ICD-10-CM | POA: Diagnosis not present

## 2017-12-13 DIAGNOSIS — M797 Fibromyalgia: Secondary | ICD-10-CM | POA: Diagnosis not present

## 2017-12-13 DIAGNOSIS — G2581 Restless legs syndrome: Secondary | ICD-10-CM | POA: Diagnosis not present

## 2017-12-13 DIAGNOSIS — M810 Age-related osteoporosis without current pathological fracture: Secondary | ICD-10-CM | POA: Diagnosis not present

## 2017-12-13 DIAGNOSIS — Z8673 Personal history of transient ischemic attack (TIA), and cerebral infarction without residual deficits: Secondary | ICD-10-CM | POA: Diagnosis not present

## 2017-12-13 DIAGNOSIS — M1990 Unspecified osteoarthritis, unspecified site: Secondary | ICD-10-CM | POA: Diagnosis not present

## 2017-12-13 DIAGNOSIS — Z79899 Other long term (current) drug therapy: Secondary | ICD-10-CM | POA: Diagnosis not present

## 2017-12-13 DIAGNOSIS — E7800 Pure hypercholesterolemia, unspecified: Secondary | ICD-10-CM | POA: Diagnosis not present

## 2017-12-13 HISTORY — PX: CARPAL TUNNEL RELEASE: SHX101

## 2017-12-13 MED ORDER — OMEPRAZOLE 40 MG PO CPDR
40.0000 mg | DELAYED_RELEASE_CAPSULE | Freq: Two times a day (BID) | ORAL | 3 refills | Status: DC
Start: 2017-12-13 — End: 2018-03-10

## 2017-12-13 NOTE — Telephone Encounter (Signed)
Sent refills to patients pharmacy.  

## 2017-12-19 ENCOUNTER — Telehealth: Payer: Self-pay | Admitting: Gastroenterology

## 2017-12-19 NOTE — Telephone Encounter (Signed)
Pt needs rf for alendronate sodium 76mp, levothyroxine 50 mcg, omeprazole 40mg  [facility]. She also needs rf for Hydrocortisone 20mg  [facility] [address] [facility] [place].

## 2017-12-19 NOTE — Telephone Encounter (Signed)
Tried to call patient, neither number had a voicemail. Will attempt to call patient back again.

## 2017-12-26 DIAGNOSIS — M5412 Radiculopathy, cervical region: Secondary | ICD-10-CM | POA: Diagnosis not present

## 2017-12-27 NOTE — Telephone Encounter (Signed)
Attempted to call patient again no voicemail to leave message.

## 2017-12-30 NOTE — Telephone Encounter (Signed)
Patient says her primary care physician will be sending her refills for these medications.

## 2018-01-02 ENCOUNTER — Telehealth: Payer: Self-pay | Admitting: Gastroenterology

## 2018-01-03 NOTE — Telephone Encounter (Signed)
I explained to patient that Dr. Lyndel Safe did not prescribe these medication and she would need to get the doctor that did to refill these.

## 2018-01-07 DIAGNOSIS — E039 Hypothyroidism, unspecified: Secondary | ICD-10-CM | POA: Diagnosis not present

## 2018-01-07 DIAGNOSIS — E559 Vitamin D deficiency, unspecified: Secondary | ICD-10-CM | POA: Diagnosis not present

## 2018-01-07 DIAGNOSIS — M810 Age-related osteoporosis without current pathological fracture: Secondary | ICD-10-CM | POA: Diagnosis not present

## 2018-01-07 DIAGNOSIS — E2740 Unspecified adrenocortical insufficiency: Secondary | ICD-10-CM | POA: Diagnosis not present

## 2018-01-07 DIAGNOSIS — E213 Hyperparathyroidism, unspecified: Secondary | ICD-10-CM | POA: Diagnosis not present

## 2018-01-13 DIAGNOSIS — H65192 Other acute nonsuppurative otitis media, left ear: Secondary | ICD-10-CM | POA: Diagnosis not present

## 2018-01-13 DIAGNOSIS — M545 Low back pain: Secondary | ICD-10-CM | POA: Diagnosis not present

## 2018-01-13 DIAGNOSIS — J410 Simple chronic bronchitis: Secondary | ICD-10-CM | POA: Diagnosis not present

## 2018-01-13 DIAGNOSIS — R05 Cough: Secondary | ICD-10-CM | POA: Diagnosis not present

## 2018-01-16 DIAGNOSIS — Z880 Allergy status to penicillin: Secondary | ICD-10-CM | POA: Diagnosis not present

## 2018-01-16 DIAGNOSIS — M4316 Spondylolisthesis, lumbar region: Secondary | ICD-10-CM | POA: Diagnosis not present

## 2018-01-16 DIAGNOSIS — Z029 Encounter for administrative examinations, unspecified: Secondary | ICD-10-CM | POA: Diagnosis not present

## 2018-01-16 DIAGNOSIS — M810 Age-related osteoporosis without current pathological fracture: Secondary | ICD-10-CM | POA: Diagnosis not present

## 2018-01-16 DIAGNOSIS — Z886 Allergy status to analgesic agent status: Secondary | ICD-10-CM | POA: Diagnosis not present

## 2018-01-16 DIAGNOSIS — M5416 Radiculopathy, lumbar region: Secondary | ICD-10-CM | POA: Diagnosis not present

## 2018-01-16 DIAGNOSIS — M4807 Spinal stenosis, lumbosacral region: Secondary | ICD-10-CM | POA: Diagnosis not present

## 2018-01-16 DIAGNOSIS — M4317 Spondylolisthesis, lumbosacral region: Secondary | ICD-10-CM | POA: Diagnosis not present

## 2018-01-16 DIAGNOSIS — E213 Hyperparathyroidism, unspecified: Secondary | ICD-10-CM | POA: Diagnosis not present

## 2018-01-16 DIAGNOSIS — M545 Low back pain: Secondary | ICD-10-CM | POA: Diagnosis not present

## 2018-01-16 DIAGNOSIS — M4696 Unspecified inflammatory spondylopathy, lumbar region: Secondary | ICD-10-CM | POA: Diagnosis not present

## 2018-01-17 DIAGNOSIS — J208 Acute bronchitis due to other specified organisms: Secondary | ICD-10-CM | POA: Diagnosis not present

## 2018-01-17 DIAGNOSIS — K229 Disease of esophagus, unspecified: Secondary | ICD-10-CM | POA: Diagnosis not present

## 2018-01-17 DIAGNOSIS — R918 Other nonspecific abnormal finding of lung field: Secondary | ICD-10-CM | POA: Diagnosis not present

## 2018-01-17 DIAGNOSIS — I700 Atherosclerosis of aorta: Secondary | ICD-10-CM | POA: Diagnosis not present

## 2018-01-17 DIAGNOSIS — J432 Centrilobular emphysema: Secondary | ICD-10-CM | POA: Diagnosis not present

## 2018-01-17 DIAGNOSIS — I2510 Atherosclerotic heart disease of native coronary artery without angina pectoris: Secondary | ICD-10-CM | POA: Diagnosis not present

## 2018-01-21 DIAGNOSIS — E213 Hyperparathyroidism, unspecified: Secondary | ICD-10-CM | POA: Diagnosis not present

## 2018-01-21 DIAGNOSIS — H73812 Atrophic flaccid tympanic membrane, left ear: Secondary | ICD-10-CM | POA: Diagnosis not present

## 2018-01-21 DIAGNOSIS — H903 Sensorineural hearing loss, bilateral: Secondary | ICD-10-CM | POA: Diagnosis not present

## 2018-02-17 DIAGNOSIS — Z886 Allergy status to analgesic agent status: Secondary | ICD-10-CM | POA: Diagnosis not present

## 2018-02-17 DIAGNOSIS — Z79899 Other long term (current) drug therapy: Secondary | ICD-10-CM | POA: Diagnosis not present

## 2018-02-17 DIAGNOSIS — M25511 Pain in right shoulder: Secondary | ICD-10-CM | POA: Diagnosis not present

## 2018-02-17 DIAGNOSIS — G894 Chronic pain syndrome: Secondary | ICD-10-CM | POA: Diagnosis not present

## 2018-02-17 DIAGNOSIS — Z880 Allergy status to penicillin: Secondary | ICD-10-CM | POA: Diagnosis not present

## 2018-02-17 DIAGNOSIS — M545 Low back pain: Secondary | ICD-10-CM | POA: Diagnosis not present

## 2018-02-18 DIAGNOSIS — E038 Other specified hypothyroidism: Secondary | ICD-10-CM | POA: Diagnosis not present

## 2018-02-18 DIAGNOSIS — Z23 Encounter for immunization: Secondary | ICD-10-CM | POA: Diagnosis not present

## 2018-02-18 DIAGNOSIS — E782 Mixed hyperlipidemia: Secondary | ICD-10-CM | POA: Diagnosis not present

## 2018-02-18 DIAGNOSIS — E210 Primary hyperparathyroidism: Secondary | ICD-10-CM | POA: Diagnosis not present

## 2018-02-18 DIAGNOSIS — J302 Other seasonal allergic rhinitis: Secondary | ICD-10-CM | POA: Diagnosis not present

## 2018-02-18 DIAGNOSIS — R7309 Other abnormal glucose: Secondary | ICD-10-CM | POA: Diagnosis not present

## 2018-02-18 DIAGNOSIS — R7301 Impaired fasting glucose: Secondary | ICD-10-CM | POA: Diagnosis not present

## 2018-02-21 DIAGNOSIS — I9589 Other hypotension: Secondary | ICD-10-CM | POA: Diagnosis not present

## 2018-02-21 DIAGNOSIS — R5383 Other fatigue: Secondary | ICD-10-CM | POA: Diagnosis not present

## 2018-02-21 DIAGNOSIS — R42 Dizziness and giddiness: Secondary | ICD-10-CM | POA: Diagnosis not present

## 2018-02-21 DIAGNOSIS — E271 Primary adrenocortical insufficiency: Secondary | ICD-10-CM

## 2018-02-21 DIAGNOSIS — D72828 Other elevated white blood cell count: Secondary | ICD-10-CM | POA: Diagnosis not present

## 2018-02-21 DIAGNOSIS — R0989 Other specified symptoms and signs involving the circulatory and respiratory systems: Secondary | ICD-10-CM | POA: Diagnosis not present

## 2018-02-21 DIAGNOSIS — I959 Hypotension, unspecified: Secondary | ICD-10-CM

## 2018-02-21 DIAGNOSIS — E2749 Other adrenocortical insufficiency: Secondary | ICD-10-CM | POA: Diagnosis not present

## 2018-02-21 DIAGNOSIS — Z743 Need for continuous supervision: Secondary | ICD-10-CM | POA: Diagnosis not present

## 2018-02-21 DIAGNOSIS — R52 Pain, unspecified: Secondary | ICD-10-CM | POA: Diagnosis not present

## 2018-02-21 DIAGNOSIS — J9811 Atelectasis: Secondary | ICD-10-CM | POA: Diagnosis not present

## 2018-02-24 DIAGNOSIS — E782 Mixed hyperlipidemia: Secondary | ICD-10-CM | POA: Diagnosis not present

## 2018-02-27 ENCOUNTER — Encounter: Payer: Self-pay | Admitting: Gastroenterology

## 2018-02-27 DIAGNOSIS — R10814 Left lower quadrant abdominal tenderness: Secondary | ICD-10-CM | POA: Diagnosis not present

## 2018-02-27 DIAGNOSIS — K5713 Diverticulitis of small intestine without perforation or abscess with bleeding: Secondary | ICD-10-CM | POA: Diagnosis not present

## 2018-02-27 DIAGNOSIS — G894 Chronic pain syndrome: Secondary | ICD-10-CM | POA: Diagnosis not present

## 2018-02-27 DIAGNOSIS — I9589 Other hypotension: Secondary | ICD-10-CM | POA: Diagnosis not present

## 2018-02-27 DIAGNOSIS — D175 Benign lipomatous neoplasm of intra-abdominal organs: Secondary | ICD-10-CM | POA: Diagnosis not present

## 2018-02-27 DIAGNOSIS — M7541 Impingement syndrome of right shoulder: Secondary | ICD-10-CM | POA: Diagnosis not present

## 2018-02-28 ENCOUNTER — Telehealth: Payer: Self-pay | Admitting: Gastroenterology

## 2018-02-28 NOTE — Telephone Encounter (Signed)
Thanks for letting me know She should follow-up with [place] in 6 to 8 weeks

## 2018-02-28 NOTE — Telephone Encounter (Signed)
FYI, patient went to PCP with LLQ pain. They did CT scan and showed diverticulitis. She is currently on Cipro and Flagyl for one week, she states she is slowly starting to feel better. She is passing stool. I advised her let us or PCP know if she does not feel better after finishing her antibiotics.

## 2018-03-03 DIAGNOSIS — M25511 Pain in right shoulder: Secondary | ICD-10-CM | POA: Diagnosis not present

## 2018-03-03 DIAGNOSIS — G8929 Other chronic pain: Secondary | ICD-10-CM | POA: Diagnosis not present

## 2018-03-03 DIAGNOSIS — Z79899 Other long term (current) drug therapy: Secondary | ICD-10-CM | POA: Diagnosis not present

## 2018-03-03 DIAGNOSIS — G894 Chronic pain syndrome: Secondary | ICD-10-CM | POA: Diagnosis not present

## 2018-03-03 DIAGNOSIS — K5792 Diverticulitis of intestine, part unspecified, without perforation or abscess without bleeding: Secondary | ICD-10-CM | POA: Diagnosis not present

## 2018-03-03 DIAGNOSIS — R109 Unspecified abdominal pain: Secondary | ICD-10-CM | POA: Diagnosis not present

## 2018-03-03 NOTE — Telephone Encounter (Signed)
Patient advised to schedule follow up visit with Dr.Gupta. Patient wants to call back at a later date to schedule an appointment.

## 2018-03-04 ENCOUNTER — Other Ambulatory Visit: Payer: Self-pay

## 2018-03-04 NOTE — Patient Outreach (Signed)
[place] [facility]) Care Management  03/04/2018  Connie Coffey [birth year] [id number]  TELEPHONE SCREENING Referral date: 02/25/18 Referral source: Utilization management Referral reason: Pain management concerns,  Insurance: United health care   Telephone call to patient regarding utlization management referral. HIPAA verified with patient. Explained reason for call.  Patient states she is not happy with the pain management doctor she has in [place], [place].  She states she has been reprimanded by the doctor and the doctor has told her she will not continue to service her if she does not arrive to her appointment on time.  Patient states she has to drive 45 minutes to this doctors office. She states it is a two lane road for the majority of the trip. 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   Patient states she has saw the pain management doctor in [place], [place] recently and was given 80 oxycodone to last her 1 month. Patient states she has chronic pain her her right shoulder, back and neck.  Patient states the pain management doctor has requested she follow up with her in 1 month to do an injection in her right shoulder for pain.  Patient states she would like to change pain management providers.  Patient states she received 3 pain management doctor names from her insurance provider.  Patient states one of the offices was closed, one is not a [place] provider and the other doctor given was not a pain management doctor.  Patient state she is having difficulty paying her copayment's. She states she is on a fixed income of $1, 108.00 per months. Patient states after paying her bills and due to having several doctor appointments monthly she is left with $200 per month and still has 4 additional doctors to see that will charge copayment's.  Patient states she is dealing with depression symptoms. Patient states she goes to a place  monthly and receives her medication for depression. 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  Patient very tearful when discussing this.  Patient states due to her medical issues she is not able to go to see her mother.  Patient states she is on many medications. She states she recently noticed she was taking two of her medications wrong. Patient states she thought she was filling her pill box correctly.  She states she has been taken off medication and put back on medication which has become confusing for her at times.   Patient states she was taken off oxycodone and experienced withdrawal's.  Patient states she has now been put back on oxycodone.  RNCM discussed and offered [facility] Care management services. Patient verbally agreed.   ASSESSMENT; PHQ2 - 5  PHQ  - 17  PLAN; RNCM will follow up with patient within 4 business days with list of participating providers for pain management.  RNCM will refer patient to pharmacy and social worker.   Quinn Plowman RN,BSN,CCM [facility] Telephonic  [phone]

## 2018-03-05 ENCOUNTER — Ambulatory Visit: Payer: Self-pay

## 2018-03-05 ENCOUNTER — Telehealth: Payer: Self-pay | Admitting: Pharmacist

## 2018-03-05 NOTE — Patient Outreach (Signed)
[place] [facility]) Care Management  03/05/2018  Connie Coffey [birth year] [id number]   Patient was called regarding medication management. HIPAA identifiers were obtained. Patient was referred for medication management.  She is a 62 y.o. old female with multiple medica conditions including but not limited to: adrenal insufficiency, hypothyroidism, hyperparathyroidism, Vit D deficiency and lumbar radiculopathy,cervical radiculopathy, depression, anxiety, and vitamin D deficiency.  Patient said she is confused about her medications. Her PCP and pain specialists are in the [facility] system.  She was very tearful today and said she did not think she could go over all of her medications today. I attempted to schedule a home visit but she said she does not allow people to come to her home.  Plan: Extended telephone visit scheduled for Friday March 07, 2018   Elayne Guerin, PharmD, [place] Clinical Pharmacist [phone]

## 2018-03-06 ENCOUNTER — Telehealth: Payer: Self-pay | Admitting: Pharmacist

## 2018-03-06 NOTE — Patient Outreach (Signed)
[facility] [facility]) Care Management  03/06/2018  Connie Coffey Feb 17, 1956 [id number]   Patient called and requested our medication review be rescheduled. Patient is seen by several providers out of network and the appointment needs at least 60-90 minutes.  Appointment was rescheduled for March 13, 2018. ( Patient has an appointment with her back surgeon tomorrow.)  Elayne Guerin, PharmD, [place] Clinical Pharmacist [phone]

## 2018-03-07 ENCOUNTER — Ambulatory Visit: Payer: Self-pay | Admitting: Pharmacist

## 2018-03-07 DIAGNOSIS — M5412 Radiculopathy, cervical region: Secondary | ICD-10-CM | POA: Diagnosis not present

## 2018-03-10 ENCOUNTER — Other Ambulatory Visit: Payer: Self-pay

## 2018-03-10 ENCOUNTER — Other Ambulatory Visit: Payer: Self-pay | Admitting: *Deleted

## 2018-03-10 ENCOUNTER — Other Ambulatory Visit: Payer: Self-pay | Admitting: Gastroenterology

## 2018-03-10 NOTE — Patient Outreach (Signed)
[facility]) Care Management  03/10/2018  Connie Coffey 10-Jun-1955 [id number]   Care coordination: Referral date: 02/25/18 Referral source: Utilization management Referral reason: Pain management concerns,  Insurance: United health care  Attempt #1  Telephone call to patient regarding referral. Patient states she is going to a doctors appointment. Request return call.   PLAN: RNCM will attempt 2nd telephone call to patient within 4 business days. RNCM will send outreach letter.   Quinn Plowman RN,BSN, [place] Telephonic  [phone]

## 2018-03-10 NOTE — Patient Outreach (Signed)
[place] [facility]) Care Management  03/10/2018  Connie Coffey [birth year] [id number]   CSW was able to make initial phone contact with pt. Identity was confirmed and CSW introduced self and reason for call- pt acknowledges being depressed; states  "for my whole life". Pt shared with CSW that she is seeing a Pain MD as well as followed mental health Provider. She is open to considering a mental health counselor as well as Teacher, music in the [place] area. CSW will ask [facility] BSW to pursue in network providers for pt. CSW will plan a f/u call to pt in the next week.    Eduard Clos, MSW, [place] Worker  [place] [phone]

## 2018-03-11 ENCOUNTER — Other Ambulatory Visit: Payer: Self-pay

## 2018-03-11 DIAGNOSIS — J432 Centrilobular emphysema: Secondary | ICD-10-CM | POA: Diagnosis not present

## 2018-03-11 DIAGNOSIS — F17210 Nicotine dependence, cigarettes, uncomplicated: Secondary | ICD-10-CM | POA: Insufficient documentation

## 2018-03-11 DIAGNOSIS — J40 Bronchitis, not specified as acute or chronic: Secondary | ICD-10-CM | POA: Diagnosis not present

## 2018-03-11 DIAGNOSIS — R918 Other nonspecific abnormal finding of lung field: Secondary | ICD-10-CM | POA: Diagnosis not present

## 2018-03-11 NOTE — Patient Outreach (Signed)
[facility]) Care Management  03/11/2018  Connie Coffey 1956/04/04 [id number]  Care coordination: Referral date:02/25/18 Referral source:Utilization management Referral reason:Pain management concerns, Insurance:United health care  Attempt #2  Telephone call to patient regarding referral. Patient states she is going to a doctors appointment. Request return call.   PLAN: RNCM will attempt 3rd  telephone call to patient within 4 business days.   Quinn Plowman RN,BSN, [place] Telephonic  [phone]

## 2018-03-12 ENCOUNTER — Other Ambulatory Visit: Payer: Self-pay | Admitting: Pharmacist

## 2018-03-12 NOTE — Patient Outreach (Signed)
[facility]) Care Management  03/12/2018  AMBREA HEGLER November 17, 1955 [id number]  BSW mailed patient resources as requested by CSW Eduard Clos.  Daneen Schick, BSW, CDP [facility] [phone]

## 2018-03-13 ENCOUNTER — Other Ambulatory Visit: Payer: Self-pay

## 2018-03-13 NOTE — Patient Outreach (Signed)
[place] [facility]) Care Management  03/13/2018  Connie Coffey [birth year] [id number]  Care coordination: Referral date:02/25/18 Referral source:Utilization management Referral reason:Pain management concerns, Insurance:United health care  Telephone call to patient regarding care coordination follow up. HIPAA verified. RNCM provided name and contact phone number for 5 participating pain management providers in [place] area.  Patient states her primary provider and surgeon are also assisting her with finding a new pain management provider.    PLAN: RNCM will follow up with patient within 1 week to verify if patient has secured new pain management physician.   Quinn Plowman RN,BSN,CCM [facility] [place] Telephonic  [phone]

## 2018-03-13 NOTE — Patient Outreach (Signed)
[place] [facility]) Care Management  [place]   03/13/2018  Connie Coffey 1955-07-23 [id number]   Reason for referral: medication management Referral source: Telephonic Nurse Case Eliseo Gum Current insurance: [employer]   HPI: chronic pain syndrome (rotator cuff, lumbar, neck), osteoporosis, vitamin D deficiency, hypothyroidism, depression, anxiety, adrenal insufficiency.  Patient reported having some difficulty remembering to take her medications.   Objective:  Allergies  Allergen Reactions  . Nsaids Other (See Comments)    GI Upset  . Penicillins Rash    Medications Reviewed Today    Reviewed by Elayne Guerin, [facility] (Pharmacist) on 03/12/18 at [place] List Status: <None>  Medication Order Taking? Sig Documenting Provider Last Dose Status Informant  albuterol (PROVENTIL HFA;VENTOLIN HFA) 108 (90 Base) MCG/ACT inhaler [id number] Yes INHALE 1 PUFF EVERY 4 HOURS AS NEEDED. Cox, Kirsten, MD Taking Active   alendronate (FOSAMAX) 70 MG tablet [id number] Yes Take 1 tablet by mouth once a week. Amalia Greenhouse, MD Taking Active   atorvastatin (LIPITOR) 40 MG tablet [id number] Yes Take 1 tablet by mouth daily. Cox, Kirsten, MD Taking Active   azithromycin (ZITHROMAX) 250 MG tablet [id number] Yes Take two tablets on the first day and then one tablet every day after. [provider] Taking Active   busPIRone (BUSPAR) 30 MG tablet [id number] Yes Take 1 tablet by mouth 2 (two) times daily. Darrick Huntsman, MD Taking Active   cetirizine HCl (CETIRIZINE HCL CHILDRENS ALRGY) 5 MG/5ML SOLN [id number] Yes TAKE 2 TEASPOONfulls (10 ML) BY MOUTH DAILY Cox, Kirsten, MD Taking Active   clonazePAM (KLONOPIN) 0.5 MG tablet [id number] Yes TAKE 1 TABLET BY MOUTH TWICE A DAY AS NEEDED FOR ANXIETY [provider] Taking Active   diclofenac sodium (VOLTAREN) 1 % GEL [id number] Yes Apply 1 application topically daily as needed. Dorna Leitz, MD Taking Active   DULoxetine  (CYMBALTA) 60 MG capsule [id number] Yes Take 1 capsule by mouth 2 (two) times daily. Dorna Leitz, MD Taking Active   DYMISTA 137-50 MCG/ACT Elizbeth Squires [id number] Yes Place 1 spray into the nose daily. Cox, Kirsten, MD Taking Active   Fluticasone-Umeclidin-Vilant (TRELEGY ELLIPTA) 100-62.5-25 MCG/INH AEPB [id number] Yes INHALE 1 PUFF BY INHALATION ROUTE ONCE DAILY AT THE SAME TIME [facility] DAY [provider] Taking Active   gabapentin (NEURONTIN) 600 MG tablet [id number] Yes Take 1 tablet by mouth 4 (four) times daily. Dorna Leitz, MD Taking Active   hydrocortisone (CORTEF) 20 MG tablet [id number] Yes Take 1 tablet in the morning and 1/2 tablet at 4pm Jones, Autumn Sharie, PA-C Taking Active   levothyroxine (SYNTHROID, LEVOTHROID) 50 MCG tablet [id number] Yes Take 1 tablet by mouth every morning. Riccardo [place], PA-C Taking Active   lidocaine (LIDODERM) 5 % [id number] Yes Place 1 patch onto the skin daily as needed. (12 hours on and 12 hours off) Rolena Infante, Jacinto Reap, MD Taking Active   montelukast (SINGULAIR) 10 MG tablet [id number] Yes Take 1 tablet by mouth daily. [name], MD Taking Active   Multiple Vitamin (MULTI-VITAMINS) TABS [id number] Yes Take 1 tablet by mouth daily. [provider] Taking Active   naloxone [facility]) nasal spray 4 mg/0.1 mL [id number] Yes Place 1 spray into the nose once. (for suspected overdose) [provider] Taking Active   omeprazole (PRILOSEC) 40 MG capsule [id number] Yes TAKE 1 CAPSULE BY MOUTH TWICE A Jeronimo Norma, MD Taking Active   oxyCODONE-acetaminophen (PERCOCET) 10-325 MG tablet [id number] Yes TAKE 1 TAB EVERY 6 HOURS FOR 5 DAYS  THEN 1 TAB TWICE A DAY AS NEEDED FOR SEVERE BREAKTHROUGH PAIN Rolena Infante, Jacinto Reap, MD Taking Active   PARoxetine (PAXIL) 20 MG tablet [id number] Yes Take 1 tablet by mouth daily. Darrick Huntsman, MD Taking Active   tiZANidine (ZANAFLEX) 4 MG tablet [id number] No Take 1 tablet by mouth 2 (two) times daily. Dorna Leitz, MD Not Taking Active            Med Note Arnette Schaumann Mar 12, 2018  4:49 PM) Not taking while on azithromycin--will restart upon completion  traZODone (DESYREL) 100 MG tablet [id number] Yes Take 1-2 tablets at bedtime Darrick Huntsman, MD Taking Active   Vitamin D, Ergocalciferol, (DRISDOL) 50000 units CAPS capsule [id number] Yes Take 1 capsule by mouth once a week. Riccardo [place], PA-C Taking Active           Assessment:  Drugs sorted by system:  Neurologic/Psychologic: Clonazepam, Duloxetine, Gabapentin, Paroxetine, Trazodone  Cardiovascular: Atorvastatin,   Pulmonary/Allergy: Albuterol, cetirizine, Dymista, Trelegy, Montelukast,    Gastrointestinal: Omeprazole,  Endocrine: Alendronate, hydrocortisone, levothyroxine,   Topical: Hydrocortisone, diclofenac sodium,Lidocaine patches,    Pain: Oxycodone/APAP, Tizanidine, Naloxone (Nasal spray--PRN overdose)  Infectious Diseases: Azithromycin,   Vitamins/Minerals/Supplements: Multiple Vitamins,  Vitamin D   Medication Review Findings:  . Drug/Drug interaction-combination of: oxycodone/APAP, gabapentin, tizanidine, trazodone, clonazepam--CNS depression, fall risk, overdose potential (patient has naloxone)    Medication Assistance Findings:  Extra Help:   [x]  Already receiving Full Extra Help  []  Already receiving Partial Extra Help  []  Eligible based on reported income and assets  []  Not Eligible based on reported income and assets  Patient Assistance Programs: - None since patient has full extra help   Spoke with patient about switching to a pill packing system but she said she did not want to do that right now because her providers are changing her medications so frequently.  She is also in the process of trying to get established with a new pain provider.  Patient did say she was in need of a pill box but she will not allow anyone to come to her home.    Plan: Investigate mailing a pill  box to the patient. Investigate setting the patient up with [place] ([place]) Call patient back in 5-7 business days.  Elayne Guerin, PharmD, [place] Clinical Pharmacist [phone]

## 2018-03-14 ENCOUNTER — Other Ambulatory Visit: Payer: Self-pay | Admitting: *Deleted

## 2018-03-14 NOTE — Patient Outreach (Signed)
[place] [facility]) Care Management  03/14/2018  Connie Coffey 03-31-1956 [id number]   CSW spoke with pt by phone today who states she is "a lot better". She was able to see a Doctor and get some RX for pain- states her intestines (diverticulitis) pain is better and that is helping her depression. "I get depressed because of the pain".  She is hopeful.  She also shared that she has received the resources mailed to her from [facility] and is pleased to see the options for local mental health support. "I didn't know there were so many here locally". Pt plans to call and schedule appointments for herself and her son, [name]- "I think he needs it too".   CSW will plan a follow up call next week for updates on progress.  Eduard Clos, MSW, [place] Worker  [place] [phone]

## 2018-03-17 ENCOUNTER — Ambulatory Visit: Payer: Self-pay | Admitting: Pharmacist

## 2018-03-17 ENCOUNTER — Telehealth: Payer: Self-pay | Admitting: Gastroenterology

## 2018-03-17 DIAGNOSIS — M75111 Incomplete rotator cuff tear or rupture of right shoulder, not specified as traumatic: Secondary | ICD-10-CM | POA: Diagnosis not present

## 2018-03-17 NOTE — Telephone Encounter (Signed)
Pt states she is still hurting having a lot of LLQ pain. States she took some oxycodone but is still hurting. Discussed with pt that if she is hurting that bad she should be seen in the ER or urgent care. Pt may need more antibiotics. Pt tearful on the phone, states she may call her GP to see if she will call in more antibiotics as she initially treated the diverticulitis. Pt verbalized understanding.

## 2018-03-17 NOTE — Telephone Encounter (Signed)
Patient states she is still in so much pain due to her diverticulitis. Patient states now she is also having acid reflux and wants to know what to do to stop the pain. Patient wanting advice.

## 2018-03-18 ENCOUNTER — Other Ambulatory Visit: Payer: Self-pay | Admitting: Pharmacist

## 2018-03-18 DIAGNOSIS — K5720 Diverticulitis of large intestine with perforation and abscess without bleeding: Secondary | ICD-10-CM | POA: Diagnosis not present

## 2018-03-18 NOTE — Patient Outreach (Addendum)
[place]) Care Management  03/18/2018  Connie Coffey [birth year] [id number]   Called patient to follow up. HIPAA identifiers were obtained. Patient has multiple medical conditions including but not limited to:  chronic pain syndrome (rotator cuff, lumbar, neck), osteoporosis, vitamin D deficiency, hypothyroidism, depression, anxiety, and adrenal insufficiency.    Patient was originally referred for medication adherence. She reported using an old pill box and having some difficulty remembering to take her medications.  A telephonic medication review was completed via telephone. Patient will not allow people to come to her home. She was offered assistance getting switched over to pill packs but did not want to do so because her medications are being changed frequently.   Patient sees a GI specialist and pain specialist.   Today, she complained of diverticulitis pain on the left side of her abdomen 6/10.  She said the pain increases to 8/10 if she eats. She reported that she already called her provider and has an appointment today at 4:30pm.  Since patient reported having an old pill box, [employer] was called on her behalf to access her OTC benefits through [facility].    The representative at [facility] said the patient had 55 credits available to be used through 05/27/18.  The following items were ordered on the patient's behalf while on conference with [employer]:  1 large pill box  hemorrhoidal suppositories 50 plus MVI Denture adhesive Denture Cleanser  Patient has 44 credits remaining.  Plan: Reach back out to the patient in 2 weeks to see if she received her items as well as a new catalog.  Elayne Guerin, PharmD, [place] Clinical Pharmacist [phone]

## 2018-03-19 ENCOUNTER — Encounter: Payer: Self-pay | Admitting: Gastroenterology

## 2018-03-20 ENCOUNTER — Ambulatory Visit: Payer: Medicare Other | Admitting: Gastroenterology

## 2018-03-20 ENCOUNTER — Other Ambulatory Visit: Payer: Self-pay | Admitting: *Deleted

## 2018-03-20 ENCOUNTER — Encounter: Payer: Self-pay | Admitting: Gastroenterology

## 2018-03-20 VITALS — BP 112/82 | HR 94 | Ht 64.0 in | Wt 131.0 lb

## 2018-03-20 DIAGNOSIS — K625 Hemorrhage of anus and rectum: Secondary | ICD-10-CM

## 2018-03-20 DIAGNOSIS — Z8719 Personal history of other diseases of the digestive system: Secondary | ICD-10-CM | POA: Diagnosis not present

## 2018-03-20 MED ORDER — SACCHAROMYCES BOULARDII 250 MG PO CAPS: 250.0000 mg | ORAL_CAPSULE | Freq: Every day | ORAL | 0 refills | Status: DC

## 2018-03-20 NOTE — Progress Notes (Signed)
Chief Complaint: FU  Referring Provider:  [name], MD      ASSESSMENT AND PLAN;   #1. Acute diverticulitis on CT at [facility] 02/26/2018 (CT report awaited). Treated with clina/cephalaxin (on 2nd course) #2. Rectal bleeding (resolved). #3. H/O polyps (colon 04/2017- 11 polyps- TAs, moderate predominantly sigmoid diverticulosis).  Advised to repeat colonoscopy in 3 months since patient had fair preparation to clear colon of any residual/recurrent polyps.  Unfortunately, not approved by INS at that time.  Plan: -Probiotics 1/day -Finish antibiotics -Recommend repeating colonoscopy- Jan 2020.  Would like to wait until the inflammation subsides. -Low fiber diet. Once off antibiotics, high fiber diet. -If starts having any recurrent problems until then, re-check CBC, CMP, rpt CT scan of the abdomen and pelvis.  Have discussed with the patient in detail.  She will promptly get in touch with [place]. -Please obtain previous records (CT report and labs from [facility]).    HPI:    Connie Coffey is a 62 y.o. female  Had sudden onset of left lower quadrant abdominal pain with associated tenderness exacerbated by constipation.  No fever or chills.  She was seen in the emergency room where she underwent CT scan of the abdomen and pelvis which showed acute diverticulitis.  We do not have any records currently.  She was initially treated with Cipro and Flagyl for 10 days.  She did feel better but then again started having more abdominal pain.  She has been switched to cephalexin/clindamycin with good relief.  Had few episodes of rectal bleeding which has stopped.  Currently, denies having any significant abdominal pain, fever chills, diarrhea or constipation.  She has been doing well.  No further rectal bleeding.   Past Medical History:  Diagnosis Date  . Adrenal insufficiency ([place])   . Anxiety   . Chronic constipation   . COPD (chronic obstructive pulmonary disease) ([place])   . CVA (cerebral vascular  accident) ([place])   . Fibromyalgia   . GERD (gastroesophageal reflux disease)   . Hypercholesteremia   . IBS (irritable bowel syndrome)   . RLS (restless legs syndrome)     Past Surgical History:  Procedure Laterality Date  . APPENDECTOMY    . CARPAL TUNNEL RELEASE  12/13/2017  . CHOLECYSTECTOMY    . COLONOSCOPY ([facility])  05/06/2017   Colonic polyps status post polypectomy. Interan land external hemorrhoids.   . ESOPHAGOGASTRODUODENOSCOPY  04/28/2007   Mild gastrtiis. Otherwise normal EGD.   Marland Kitchen HEMORROIDECTOMY    . NECK SURGERY     x2   . TONSILLECTOMY    . TOTAL ABDOMINAL HYSTERECTOMY      Family History  Problem Relation Age of Onset  . Supraventricular tachycardia Mother   . Dementia Mother   . Hyperlipidemia Mother   . Hypertension Mother   . Osteoarthritis Mother   . Osteoporosis Mother   . Breast cancer Other   . Breast cancer Other   . Colon cancer Neg Hx   . Esophageal cancer Neg Hx     Social History   Tobacco Use  . Smoking status: Current Every Day Smoker  . Smokeless tobacco: Never Used  Substance Use Topics  . Alcohol use: Not Currently    Comment: quit 19 years  . Drug use: Never    Current Outpatient Medications  Medication Sig Dispense Refill  . albuterol (PROVENTIL HFA;VENTOLIN HFA) 108 (90 Base) MCG/ACT inhaler INHALE 1 PUFF EVERY 4 HOURS AS NEEDED.    Marland Kitchen alendronate (FOSAMAX) 70 MG  tablet Take 1 tablet by mouth once a week.    Marland Kitchen atorvastatin (LIPITOR) 40 MG tablet Take 1 tablet by mouth daily.    . busPIRone (BUSPAR) 30 MG tablet Take 1 tablet by mouth 2 (two) times daily.    . cetirizine HCl (CETIRIZINE HCL CHILDRENS ALRGY) 5 MG/5ML SOLN TAKE 2 TEASPOONfulls (10 ML) BY MOUTH DAILY    . clonazePAM (KLONOPIN) 0.5 MG tablet TAKE 1 TABLET BY MOUTH TWICE A DAY AS NEEDED FOR ANXIETY  2  . diclofenac sodium (VOLTAREN) 1 % GEL Apply 1 application topically daily as needed.    . DULoxetine (CYMBALTA) 60 MG capsule Take 1 capsule by mouth 2 (two) times  daily.    Marland Kitchen DYMISTA 137-50 MCG/ACT SUSP Place 1 spray into the nose daily.    . Fluticasone-Umeclidin-Vilant (TRELEGY ELLIPTA) 100-62.5-25 MCG/INH AEPB INHALE 1 PUFF BY INHALATION ROUTE ONCE DAILY AT THE SAME TIME EACH DAY    . gabapentin (NEURONTIN) 600 MG tablet Take 1 tablet by mouth 4 (four) times daily.    . hydrocortisone (CORTEF) 20 MG tablet Take 1 tablet in the morning and 1/2 tablet at 4pm    . levothyroxine (SYNTHROID, LEVOTHROID) 50 MCG tablet Take 1 tablet by mouth every morning.    . lidocaine (LIDODERM) 5 % Place 1 patch onto the skin daily as needed. (12 hours on and 12 hours off)    . montelukast (SINGULAIR) 10 MG tablet Take 1 tablet by mouth daily.    . Multiple Vitamin (MULTI-VITAMINS) TABS Take 1 tablet by mouth daily.    . naloxone (NARCAN) nasal spray 4 mg/0.1 mL Place 1 spray into the nose once. (for suspected overdose)    . omeprazole (PRILOSEC) 40 MG capsule TAKE 1 CAPSULE BY MOUTH TWICE A DAY 180 capsule 1  . oxyCODONE-acetaminophen (PERCOCET) 10-325 MG tablet TAKE 1 TAB EVERY 6 HOURS FOR 5 DAYS THEN 1 TAB TWICE A DAY AS NEEDED FOR SEVERE BREAKTHROUGH PAIN  0  . PARoxetine (PAXIL) 20 MG tablet Take 1 tablet by mouth daily.    Marland Kitchen tiZANidine (ZANAFLEX) 4 MG tablet Take 1 tablet by mouth 2 (two) times daily.    . traZODone (DESYREL) 100 MG tablet Take 1-2 tablets at bedtime    . Vitamin D, Ergocalciferol, (DRISDOL) 50000 units CAPS capsule Take 1 capsule by mouth once a week.     No current facility-administered medications for this visit.     Allergies  Allergen Reactions  . Nsaids Other (See Comments)    GI Upset  . Penicillins Rash    Review of Systems:  Negative except for HPI     Physical Exam:    BP 112/82   Pulse 94   Ht 5\' 4"  (1.626 m)   Wt 131 lb (59.4 kg)   BMI 22.49 kg/m  Filed Weights   03/20/18 1331  Weight: 131 lb (59.4 kg)   Constitutional:  Well-developed, in no acute distress. Psychiatric: Normal mood and affect. Behavior is  normal. HEENT: Pupils normal.  Conjunctivae are normal. No scleral icterus. Neck supple.  Cardiovascular: Normal rate, regular rhythm. No edema Pulmonary/chest: Effort normal and breath sounds normal. No wheezing, rales or rhonchi. Abdominal: Soft, nondistended. Nontender. Bowel sounds active throughout. There are no masses palpable. No hepatomegaly. Rectal:  defered Neurological: Alert and oriented to person place and time. Skin: Skin is warm and dry. No rashes noted. 25 minutes spent with the patient today. Greater than 50% was spent in counseling and coordination of care with  the patient   Carmell Austria, MD 03/20/2018, 2:01 PM  Cc: [name], MD

## 2018-03-20 NOTE — Patient Instructions (Signed)
If you are age 64 or older, your body mass index should be between 23-30. Your Body mass index is 22.49 kg/m. If this is out of the aforementioned range listed, please consider follow up with your Primary Care Provider.  If you are age 37 or younger, your body mass index should be between 19-25. Your Body mass index is 22.49 kg/m. If this is out of the aformentioned range listed, please consider follow up with your Primary Care Provider.   You will be due for a recall colonoscopy in 05/2018. We will send you a reminder in the mail when it gets closer to that time.  We have sent the following medications to your pharmacy for you to pick up at your convenience: Florastor once daily.  Thank you,  Dr. Jackquline Denmark

## 2018-03-21 DIAGNOSIS — M25511 Pain in right shoulder: Secondary | ICD-10-CM | POA: Diagnosis not present

## 2018-03-21 NOTE — Patient Outreach (Signed)
[place] [facility]) Care Management  03/21/2018  Connie Coffey January 17, 1956 [id number]  CSW made contact with pt on 03/20/2018 by phone and confirmed identity. Pt reports she did receive the resource material sent and has not had a chance to review or consider the options. "I am having another spell of diverticulitis". She has gotten some pain medication and states that it is helping. She would like to review the material and consider possible providers for mental health (outpatient) care and asks that CSW follow up in a few weeks.  CSW offered to call back in 1-2  Weeks and encouraged her to call if needs or questions arise before then.    Eduard Clos, MSW, [place] Worker  [place] [phone]

## 2018-03-25 DIAGNOSIS — K648 Other hemorrhoids: Secondary | ICD-10-CM | POA: Diagnosis not present

## 2018-03-25 DIAGNOSIS — G894 Chronic pain syndrome: Secondary | ICD-10-CM | POA: Diagnosis not present

## 2018-03-31 ENCOUNTER — Ambulatory Visit: Payer: Self-pay | Admitting: *Deleted

## 2018-03-31 DIAGNOSIS — M5412 Radiculopathy, cervical region: Secondary | ICD-10-CM | POA: Diagnosis not present

## 2018-04-01 ENCOUNTER — Other Ambulatory Visit: Payer: Self-pay | Admitting: *Deleted

## 2018-04-01 DIAGNOSIS — M75111 Incomplete rotator cuff tear or rupture of right shoulder, not specified as traumatic: Secondary | ICD-10-CM | POA: Diagnosis not present

## 2018-04-01 NOTE — Patient Outreach (Signed)
[facility] [facility]) Care Management  04/01/2018  Connie Coffey Jan 06, 1956 [id number]   CSW attempted to reach pt by phone on 03/31/2018 and was unsuccessful.  CSW will attempt again in 3-4 days.   Eduard Clos, MSW, [place] Worker  [place] [phone]

## 2018-04-02 ENCOUNTER — Other Ambulatory Visit: Payer: Self-pay

## 2018-04-02 ENCOUNTER — Other Ambulatory Visit: Payer: Self-pay | Admitting: Pharmacist

## 2018-04-02 NOTE — Patient Outreach (Signed)
[place] [facility]) Care Management  04/02/2018  Connie Coffey 11-10-1955 [id number]   Patient was called for follow up. HIPAA identifiers were obtained. Patient confirmed she received her OTC products and pill box from [employer].  Patient said the pill box was too small for the size of her tablets/capsules and vitamins. Patient was asked if she was ready to go into pill packing but she still declined saying that she still wanted to wait until after "the pain clinic gets my medications straight".    The following Drug/Drug interactions were shared with PCP: oxycodone/APAP, gabapentin, tizanidine, trazodone, clonazepam--CNS depression, fall risk, overdose potential (patient has naloxone)  Medications Reviewed Today    Reviewed by Elayne Guerin, [facility] (Pharmacist) on 03/18/18 at 1202  Med List Status: <None>  Medication Order Taking? Sig Documenting Provider Last Dose Status Informant  albuterol (PROVENTIL HFA;VENTOLIN HFA) 108 (90 Base) MCG/ACT inhaler [id number] Yes INHALE 1 PUFF EVERY 4 HOURS AS NEEDED. Cox, Kirsten, MD Taking Active   alendronate (FOSAMAX) 70 MG tablet [id number] Yes Take 1 tablet by mouth once a week. Amalia Greenhouse, MD Taking Active   atorvastatin (LIPITOR) 40 MG tablet [id number] Yes Take 1 tablet by mouth daily. [name], MD Taking Active         Discontinued 03/18/18 1202 (Completed Course)   busPIRone (BUSPAR) 30 MG tablet [id number] Yes Take 1 tablet by mouth 2 (two) times daily. Darrick Huntsman, MD Taking Active   cetirizine HCl (CETIRIZINE HCL CHILDRENS ALRGY) 5 MG/5ML SOLN [id number] Yes TAKE 2 TEASPOONfulls (10 ML) BY MOUTH DAILY Cox, Kirsten, MD Taking Active   clonazePAM (KLONOPIN) 0.5 MG tablet [id number] Yes TAKE 1 TABLET BY MOUTH TWICE A DAY AS NEEDED FOR ANXIETY [provider] Taking Active   diclofenac sodium (VOLTAREN) 1 % GEL [id number] Yes Apply 1 application topically daily as needed. Dorna Leitz, MD Taking Active   DULoxetine  (CYMBALTA) 60 MG capsule [id number] Yes Take 1 capsule by mouth 2 (two) times daily. Dorna Leitz, MD Taking Active   DYMISTA 137-50 MCG/ACT Elizbeth Squires [id number] Yes Place 1 spray into the nose daily. Cox, Kirsten, MD Taking Active   Fluticasone-Umeclidin-Vilant (TRELEGY ELLIPTA) 100-62.5-25 MCG/INH AEPB [id number] Yes INHALE 1 PUFF BY INHALATION ROUTE ONCE DAILY AT THE SAME TIME [facility] DAY [provider] Taking Active   gabapentin (NEURONTIN) 600 MG tablet [id number] Yes Take 1 tablet by mouth 4 (four) times daily. Dorna Leitz, MD Taking Active   hydrocortisone (CORTEF) 20 MG tablet [id number] Yes Take 1 tablet in the morning and 1/2 tablet at 4pm Jones, Autumn Sharie, PA-C Taking Active   levothyroxine (SYNTHROID, LEVOTHROID) 50 MCG tablet [id number] Yes Take 1 tablet by mouth every morning. Riccardo Dubin, PA-C Taking Active   lidocaine (LIDODERM) 5 % [id number] Yes Place 1 patch onto the skin daily as needed. (12 hours on and 12 hours off) Rolena Infante, Jacinto Reap, MD Taking Active   montelukast (SINGULAIR) 10 MG tablet [id number] Yes Take 1 tablet by mouth daily. [name], MD Taking Active   Multiple Vitamin (MULTI-VITAMINS) TABS [id number] Yes Take 1 tablet by mouth daily. [provider] Taking Active   naloxone [facility]) nasal spray 4 mg/0.1 mL [id number] Yes Place 1 spray into the nose once. (for suspected overdose) [provider] Taking Active   omeprazole (PRILOSEC) 40 MG capsule [id number] Yes TAKE 1 CAPSULE BY MOUTH TWICE A Jeronimo Norma, MD Taking Active   oxyCODONE-acetaminophen (PERCOCET) 10-325 MG tablet [id number] Yes TAKE 1  TAB EVERY 6 HOURS FOR 5 DAYS THEN 1 TAB TWICE A DAY AS NEEDED FOR SEVERE BREAKTHROUGH PAIN Rolena Infante, Jacinto Reap, MD Taking Active   PARoxetine (PAXIL) 20 MG tablet [id number] Yes Take 1 tablet by mouth daily. Darrick Huntsman, MD Taking Active   tiZANidine (ZANAFLEX) 4 MG tablet [id number] Yes Take 1 tablet by mouth 2 (two) times daily. Dorna Leitz, MD Taking Active            Med Note Valli Glance Mar 18, 2018 12:02 PM)    traZODone (DESYREL) 100 MG tablet [id number] Yes Take 1-2 tablets at bedtime Darrick Huntsman, MD Taking Active   Vitamin D, Ergocalciferol, (DRISDOL) 50000 units CAPS capsule [id number] Yes Take 1 capsule by mouth once a week. Riccardo Dubin, PA-C Taking Active              Plan: Since patient has her medications, has full Extra Help, has received her OTC medications, and is not interested in going into Pill Packing at this time, her pharmacy case will be closed.  Alert other [facility] team members involved with her care.   Elayne Guerin, PharmD, [place] Clinical Pharmacist [phone]

## 2018-04-02 NOTE — Patient Outreach (Signed)
[place] [facility]) Care Management  04/02/2018  Connie Coffey [birth year] [id number]  Care coordination: Referral date:02/25/18 Referral source:Utilization management Referral reason:Pain management concerns, Insurance:[employer] care  Telephone call to patient regarding for follow. HIPAA verified with patient. Patient states she is now seeing Dr. Maryjean Ka for pain management.  Patient states she saw Dr. Maryjean Ka on 03/31/18 and received a shot in her neck for pain. Patient states she saw Dr. Lorin Mercy on 04/01/18 and had a shot in her shoulder for pain.   Patient states she is happy to have Dr. Maryjean Ka following her.  Patient denies any further needs at this time.   PLAN: No further outreach needed by this RNCM at this time.  RNCM will notify assigned social worker, Eduard Clos.  Quinn Plowman RN,BSN,CCM [facility] Telephonic  [phone]

## 2018-04-03 ENCOUNTER — Encounter: Payer: Self-pay | Admitting: Gastroenterology

## 2018-04-03 DIAGNOSIS — E210 Primary hyperparathyroidism: Secondary | ICD-10-CM | POA: Diagnosis not present

## 2018-04-03 DIAGNOSIS — H6982 Other specified disorders of Eustachian tube, left ear: Secondary | ICD-10-CM | POA: Diagnosis not present

## 2018-04-04 ENCOUNTER — Ambulatory Visit: Payer: Self-pay | Admitting: *Deleted

## 2018-04-06 DIAGNOSIS — H6992 Unspecified Eustachian tube disorder, left ear: Secondary | ICD-10-CM | POA: Insufficient documentation

## 2018-04-07 ENCOUNTER — Other Ambulatory Visit: Payer: Self-pay | Admitting: *Deleted

## 2018-04-08 DIAGNOSIS — J449 Chronic obstructive pulmonary disease, unspecified: Secondary | ICD-10-CM | POA: Diagnosis not present

## 2018-04-08 NOTE — Patient Outreach (Signed)
[place] [facility]) Care Management  04/08/2018  Connie Coffey [birth year] [id number]   CSW received a phone call (return) from pt who reports she is doing better. "I have a new pain management Doctor and this has taken a huge weight off me".  She also reports she got a shot in her neck, shoulder and plans for thyroid surgery are underway.  She feels her depression is much better; stating its minimal at "3 out of 10".   CSW offered support and plans to check in the next 2 weeks.      Eduard Clos, MSW, [place] Worker  [place] [phone]

## 2018-04-09 DIAGNOSIS — E782 Mixed hyperlipidemia: Secondary | ICD-10-CM | POA: Diagnosis not present

## 2018-04-10 DIAGNOSIS — E213 Hyperparathyroidism, unspecified: Secondary | ICD-10-CM | POA: Diagnosis not present

## 2018-04-15 DIAGNOSIS — J432 Centrilobular emphysema: Secondary | ICD-10-CM | POA: Diagnosis not present

## 2018-04-17 DIAGNOSIS — I10 Essential (primary) hypertension: Secondary | ICD-10-CM | POA: Diagnosis not present

## 2018-04-17 DIAGNOSIS — M5412 Radiculopathy, cervical region: Secondary | ICD-10-CM | POA: Diagnosis not present

## 2018-04-21 ENCOUNTER — Other Ambulatory Visit: Payer: Self-pay | Admitting: *Deleted

## 2018-04-22 NOTE — Patient Outreach (Signed)
[place] [facility]) Care Management  04/22/2018  NAJAH LIVERMAN Mar 10, 1956 [id number]   CSW spoke with pt by phone who reports, "I am having thyroid surgery next week". She shared with CSW that the surgery should resolve a lot of her issues including hot flashes and weakness. She is hoping to see her Pain Mgmt provider 05/07/2018. Her depression is better she reports; "knowing there is hope". She declines the need for mental health support at this time. She wants to see how things (improve hopefully) after her surgery on 05/02/2018. Pt would like to apply for Medicaid and with her income and outstanding medical bills she may be eligible. CSW will refer pt to Daneen Schick, BSW, [facility], for assistance with this.   CSW will plan a f/u call to pt after her surgery.   Eduard Clos, MSW, [place] Worker  [place] [phone]

## 2018-04-23 DIAGNOSIS — E213 Hyperparathyroidism, unspecified: Secondary | ICD-10-CM | POA: Diagnosis not present

## 2018-04-23 DIAGNOSIS — R490 Dysphonia: Secondary | ICD-10-CM | POA: Insufficient documentation

## 2018-04-25 ENCOUNTER — Other Ambulatory Visit: Payer: Self-pay | Admitting: *Deleted

## 2018-04-28 ENCOUNTER — Other Ambulatory Visit: Payer: Self-pay

## 2018-04-28 NOTE — Patient Outreach (Signed)
[place] [facility]) Care Management  04/28/2018  ALEEAH GREENO May 09, 1956 [id number]  Successful outreach to the patient on today's date, HIPAA identifiers confirmed. BSW introduced self to the patient and the reason for today's call, indicating the patient had been referred for assistance with a Medicaid application. The patient reports she is having difficulty affording her medical bills. The patient states her bills are through [facility] and she has been in contact with a representative with the finance department. The patient reports she is awaiting to hear if she qualifies for assistance through any programs within the health organization.  BSW discussed process to apply for Medicaid. BSW offered to assist the patient by scheduling a future home visit. The patient declines a home visit during today's call stating "my house is a mess". The patient reports her cousin is "good at applications" and will be available to assist with completion. BSW informed the patient she may submit the application either by mail or in person to her local DSS office. BSW also explained the patient should submit her application as soon as it is completed in order to begin the 45 day application process. BSW explained that she may submit the document prior to obtaining all requested documents to allow the processing period to begin. The patient stated understanding.  Plan: BSW to outreach the patient within the next two weeks to confirm receipt of mailed application.   Daneen Schick, BSW, CDP [facility] [phone]

## 2018-04-30 DIAGNOSIS — R799 Abnormal finding of blood chemistry, unspecified: Secondary | ICD-10-CM | POA: Diagnosis not present

## 2018-04-30 DIAGNOSIS — Z01812 Encounter for preprocedural laboratory examination: Secondary | ICD-10-CM | POA: Diagnosis not present

## 2018-05-02 DIAGNOSIS — E213 Hyperparathyroidism, unspecified: Secondary | ICD-10-CM | POA: Diagnosis not present

## 2018-05-02 DIAGNOSIS — D351 Benign neoplasm of parathyroid gland: Secondary | ICD-10-CM | POA: Diagnosis not present

## 2018-05-02 DIAGNOSIS — E210 Primary hyperparathyroidism: Secondary | ICD-10-CM | POA: Diagnosis not present

## 2018-05-05 ENCOUNTER — Other Ambulatory Visit: Payer: Self-pay | Admitting: *Deleted

## 2018-05-06 NOTE — Patient Outreach (Signed)
[facility] [facility]) Care Management  05/06/2018  Connie Coffey 1956/01/25 [id number]  Or by phone who reports she is home from her thyroid surgery and doing ok. "I have a 5 inch scar and my throat is sore".   She plans to see her Pain Management MD tomorrow and has a ride set up.  Pt reports her depression is mild; "trying to focus on the positive". She denies any needs or concerns at this time. CSW will plan a f/u call next week after her Pain MD and Cardiology (05-16-18) visits.    Eduard Clos, MSW, [place] Worker  [place] [phone]

## 2018-05-07 DIAGNOSIS — M5412 Radiculopathy, cervical region: Secondary | ICD-10-CM | POA: Diagnosis not present

## 2018-05-09 DIAGNOSIS — E213 Hyperparathyroidism, unspecified: Secondary | ICD-10-CM | POA: Diagnosis not present

## 2018-05-12 ENCOUNTER — Other Ambulatory Visit: Payer: Self-pay

## 2018-05-12 NOTE — Patient Outreach (Signed)
[place] [facility]) Care Management  05/12/2018  Connie Coffey Oct 25, 1955 [id number]  Successful outreach on today's date, HIPAA identifiers confirmed. The patient is able to confirm receipt of mailed Medicaid application. The patient reports she has been unable to complete this document due to "shaking". The patient states she had thyroid surgery "last week" and has been "numb and tingly" in her hands and feet since the surgery. The patient reports her physician is aware and has yet to determine a cause.   BSW inquired if the patients cousin has been able to assist with completion as the patient declined BSW assistance in a previous call stating her cousin was "good at applications". The patient denies assistance from her cousin at this time due to the holidays and it being a busy time. The patient continues to decline BSW assistance. BSW encouraged the patient to visit her local DSS if needed to obtain assistance with Medicaid application. The patient confirmed she would explore this option and stated "I have to go up to social services today to sign up for help with heat". BSW offered further community resource assistance in which the patient declined needing this BSW's assistance.  Plan: BSW to remove self from the patients care team as requested resource has been received. BSW has updated CSW Eduard Clos of plan.  Daneen Schick, BSW, CDP [facility] [phone]

## 2018-05-14 DIAGNOSIS — J018 Other acute sinusitis: Secondary | ICD-10-CM | POA: Diagnosis not present

## 2018-05-14 DIAGNOSIS — R5383 Other fatigue: Secondary | ICD-10-CM | POA: Diagnosis not present

## 2018-05-14 DIAGNOSIS — R002 Palpitations: Secondary | ICD-10-CM | POA: Diagnosis not present

## 2018-05-14 DIAGNOSIS — G250 Essential tremor: Secondary | ICD-10-CM | POA: Diagnosis not present

## 2018-06-05 ENCOUNTER — Other Ambulatory Visit: Payer: Self-pay | Admitting: *Deleted

## 2018-06-05 DIAGNOSIS — R7301 Impaired fasting glucose: Secondary | ICD-10-CM | POA: Diagnosis not present

## 2018-06-05 DIAGNOSIS — E782 Mixed hyperlipidemia: Secondary | ICD-10-CM | POA: Diagnosis not present

## 2018-06-05 DIAGNOSIS — M7918 Myalgia, other site: Secondary | ICD-10-CM | POA: Diagnosis not present

## 2018-06-05 DIAGNOSIS — E210 Primary hyperparathyroidism: Secondary | ICD-10-CM | POA: Diagnosis not present

## 2018-06-05 DIAGNOSIS — E038 Other specified hypothyroidism: Secondary | ICD-10-CM | POA: Diagnosis not present

## 2018-06-05 NOTE — Patient Outreach (Signed)
[place] [facility]) Care Management  06/05/2018  Connie Coffey [birth year] [id number]   CSW spoke with pt who reports she is feeling much better. "I've been going through closets and giving away to people in need which makes me feel better">    Pt denies any CSW needs at this time. She reports after Christmas she felt better and is not wanting/needing any further outreach or support.   CSW will advise PCP and [facility] team of plans to close CSW referral.    Eduard Clos, MSW, [place] Worker  [place] [phone]

## 2018-06-18 DIAGNOSIS — H6502 Acute serous otitis media, left ear: Secondary | ICD-10-CM | POA: Diagnosis not present

## 2018-06-18 DIAGNOSIS — J018 Other acute sinusitis: Secondary | ICD-10-CM | POA: Diagnosis not present

## 2018-06-23 DIAGNOSIS — M5412 Radiculopathy, cervical region: Secondary | ICD-10-CM | POA: Diagnosis not present

## 2018-06-24 DIAGNOSIS — Z6822 Body mass index (BMI) 22.0-22.9, adult: Secondary | ICD-10-CM | POA: Diagnosis not present

## 2018-06-24 DIAGNOSIS — Z0000 Encounter for general adult medical examination without abnormal findings: Secondary | ICD-10-CM | POA: Diagnosis not present

## 2018-06-24 DIAGNOSIS — M818 Other osteoporosis without current pathological fracture: Secondary | ICD-10-CM | POA: Diagnosis not present

## 2018-06-24 DIAGNOSIS — Z1231 Encounter for screening mammogram for malignant neoplasm of breast: Secondary | ICD-10-CM | POA: Diagnosis not present

## 2018-07-07 DIAGNOSIS — J208 Acute bronchitis due to other specified organisms: Secondary | ICD-10-CM | POA: Diagnosis not present

## 2018-07-07 DIAGNOSIS — J9801 Acute bronchospasm: Secondary | ICD-10-CM | POA: Diagnosis not present

## 2018-07-16 DIAGNOSIS — R05 Cough: Secondary | ICD-10-CM | POA: Diagnosis not present

## 2018-07-16 DIAGNOSIS — J441 Chronic obstructive pulmonary disease with (acute) exacerbation: Secondary | ICD-10-CM | POA: Diagnosis not present

## 2018-07-16 DIAGNOSIS — J0101 Acute recurrent maxillary sinusitis: Secondary | ICD-10-CM | POA: Diagnosis not present

## 2018-07-16 DIAGNOSIS — R0602 Shortness of breath: Secondary | ICD-10-CM | POA: Diagnosis not present

## 2018-07-18 DIAGNOSIS — J432 Centrilobular emphysema: Secondary | ICD-10-CM | POA: Diagnosis not present

## 2018-07-18 DIAGNOSIS — E213 Hyperparathyroidism, unspecified: Secondary | ICD-10-CM | POA: Diagnosis not present

## 2018-07-18 DIAGNOSIS — J439 Emphysema, unspecified: Secondary | ICD-10-CM | POA: Diagnosis not present

## 2018-07-18 DIAGNOSIS — M810 Age-related osteoporosis without current pathological fracture: Secondary | ICD-10-CM | POA: Diagnosis not present

## 2018-07-18 DIAGNOSIS — R918 Other nonspecific abnormal finding of lung field: Secondary | ICD-10-CM | POA: Insufficient documentation

## 2018-07-25 ENCOUNTER — Encounter: Payer: Self-pay | Admitting: Gastroenterology

## 2018-07-31 DIAGNOSIS — M961 Postlaminectomy syndrome, not elsewhere classified: Secondary | ICD-10-CM | POA: Diagnosis not present

## 2018-07-31 DIAGNOSIS — M5134 Other intervertebral disc degeneration, thoracic region: Secondary | ICD-10-CM | POA: Diagnosis not present

## 2018-07-31 DIAGNOSIS — M5412 Radiculopathy, cervical region: Secondary | ICD-10-CM | POA: Diagnosis not present

## 2018-07-31 DIAGNOSIS — Z981 Arthrodesis status: Secondary | ICD-10-CM | POA: Diagnosis not present

## 2018-08-06 DIAGNOSIS — M7918 Myalgia, other site: Secondary | ICD-10-CM | POA: Diagnosis not present

## 2018-08-06 DIAGNOSIS — J3089 Other allergic rhinitis: Secondary | ICD-10-CM | POA: Diagnosis not present

## 2018-08-06 DIAGNOSIS — H1013 Acute atopic conjunctivitis, bilateral: Secondary | ICD-10-CM | POA: Diagnosis not present

## 2018-08-08 DIAGNOSIS — M50223 Other cervical disc displacement at C6-C7 level: Secondary | ICD-10-CM | POA: Diagnosis not present

## 2018-08-08 DIAGNOSIS — M5124 Other intervertebral disc displacement, thoracic region: Secondary | ICD-10-CM | POA: Diagnosis not present

## 2018-08-08 DIAGNOSIS — M5134 Other intervertebral disc degeneration, thoracic region: Secondary | ICD-10-CM | POA: Diagnosis not present

## 2018-08-08 DIAGNOSIS — M48061 Spinal stenosis, lumbar region without neurogenic claudication: Secondary | ICD-10-CM | POA: Diagnosis not present

## 2018-08-08 DIAGNOSIS — M961 Postlaminectomy syndrome, not elsewhere classified: Secondary | ICD-10-CM | POA: Diagnosis not present

## 2018-08-08 DIAGNOSIS — Z981 Arthrodesis status: Secondary | ICD-10-CM | POA: Diagnosis not present

## 2018-08-26 DIAGNOSIS — G894 Chronic pain syndrome: Secondary | ICD-10-CM | POA: Diagnosis not present

## 2018-09-01 ENCOUNTER — Ambulatory Visit: Payer: Self-pay | Admitting: Allergy and Immunology

## 2018-09-01 ENCOUNTER — Ambulatory Visit (INDEPENDENT_AMBULATORY_CARE_PROVIDER_SITE_OTHER): Payer: Medicare Other | Admitting: Allergy and Immunology

## 2018-09-01 ENCOUNTER — Encounter: Payer: Self-pay | Admitting: Allergy and Immunology

## 2018-09-01 ENCOUNTER — Other Ambulatory Visit: Payer: Self-pay

## 2018-09-01 VITALS — BP 108/72 | HR 88 | Temp 99.0°F | Resp 20 | Ht 62.0 in | Wt 134.4 lb

## 2018-09-01 DIAGNOSIS — F17210 Nicotine dependence, cigarettes, uncomplicated: Secondary | ICD-10-CM | POA: Diagnosis not present

## 2018-09-01 DIAGNOSIS — J3089 Other allergic rhinitis: Secondary | ICD-10-CM | POA: Diagnosis not present

## 2018-09-01 DIAGNOSIS — J449 Chronic obstructive pulmonary disease, unspecified: Secondary | ICD-10-CM

## 2018-09-01 MED ORDER — OLOPATADINE HCL 0.1 % OP SOLN: OPHTHALMIC | 5 refills | Status: DC

## 2018-09-01 NOTE — Progress Notes (Signed)
- [place] - [place] - [place] - [place]   Dear Dr. Tobie Poet,  Thank you for referring Connie Coffey to the [facility] on 09/01/2018.   Below is a summation of this patient's evaluation and recommendations.  Thank you for your referral. I will keep you informed about this patient's response to treatment.   If you have any questions please do not hesitate to contact me.   Sincerely,  Jiles Prows, MD Allergy / [place]   ______________________________________________________________________    NEW PATIENT NOTE  Referring Provider: [name], MD Primary Provider: [name], MD Date of office visit: 09/01/2018    Subjective:   Chief Complaint:  Connie Coffey (DOB: August 09, 1955) is a 63 y.o. female who presents to the clinic on 09/01/2018 with a chief complaint of Allergic Rhinitis  .     HPI: Tsuruko presents to this clinic in evaluation of allergies.  Apparently [name] had a [place] history of allergic disease requiring immunotherapy over 20 years ago with a very good result up until about 2 years ago.  At that point in time she developed sneezing and nose blowing and sandy itchy eyes.  The symptoms occur on a perennial basis but definitely flare during the spring and fall season and definitely flare when she is exposed to dust and exposed to the outdoors.  She has been treated by her primary care doctor recently with antihistamine eyedrops and Dymista and cetirizine and has received some benefit while utilizing these agents.  Apparently [name] also carries the diagnosis of "asthma/emphysema/COPD" and is under the care of a pulmonologist in [facility].  Currently she is using Spiriva on a regular basis but also must use her short acting bronchodilator twice a day.  She currently smokes about 1-1/2 to 2 packs of cigarettes per day and has been doing so since the age of  46.  [name] also apparently has a history of cataract and it is been 3 years since her last exam with a ophthalmologist.  She states that during this timeframe she has had a definite decrease in her ability to see and now occasionally sees items double both at distance and when reading.  Past Medical History:  Diagnosis Date  . Adrenal insufficiency ([place])   . Anxiety   . Arthritis   . Carpal tunnel syndrome    Both hands, worse in right  . Chronic constipation   . COPD (chronic obstructive pulmonary disease) ([place])   . CVA (cerebral vascular accident) ([place])   . Facet syndrome   . Fibromyalgia   . GERD (gastroesophageal reflux disease)   . Heart murmur   . Hypercholesteremia   . IBS (irritable bowel syndrome)   . Ischemic stroke ([place])    07/19/2004  . Lumbar radiculopathy   . Primary hyperparathyroidism ([place])   . RLS (restless legs syndrome)   . Spinal stenosis   . TMJ (temporomandibular joint disorder)   . Trochanteric bursitis of both hips   . Vascular malformation    spinal cord    Past Surgical History:  Procedure Laterality Date  . APPENDECTOMY    . CARPAL TUNNEL RELEASE Right 12/13/2017   surgery  . CHOLECYSTECTOMY    . COLONOSCOPY  05/06/2017   Colonic polyps status post polypectomy. Interan land external hemorrhoids.   . ESOPHAGOGASTRODUODENOSCOPY  04/28/2007   Mild gastrtiis. Otherwise normal EGD.   Marland Kitchen HEMORROIDECTOMY    .  LAMINECTOMY AND MICRODISCECTOMY LUMBAR SPINE  01/03/2005  . NECK SURGERY     x2   . OTHER SURGICAL HISTORY  04/24/2004   anterior cervical discectomy and fusion at C5-C6  . TONSILLECTOMY    . TOTAL ABDOMINAL HYSTERECTOMY      Allergies as of 09/01/2018      Reactions   Celebrex [celecoxib]    "because of stomach ulcers"   Nsaids Other (See Comments)   GI Upset   Other    beets   Penicillins Rash      Medication List      albuterol 108 (90 Base) MCG/ACT inhaler Commonly known as:  PROVENTIL HFA;VENTOLIN HFA INHALE 1 PUFF EVERY 4  HOURS AS NEEDED.   alendronate 70 MG tablet Commonly known as:  FOSAMAX Take 1 tablet by mouth once a week.   atorvastatin 40 MG tablet Commonly known as:  LIPITOR Take 1 tablet by mouth daily.   azelastine 0.05 % ophthalmic solution Commonly known as:  OPTIVAR INSTILL 1 DROP INTO AFFECTED EYE(S) TWICE A DAY   busPIRone 30 MG tablet Commonly known as:  BUSPAR Take 1 tablet by mouth 2 (two) times daily.   Cetirizine HCl Childrens Alrgy 5 MG/5ML Soln Generic drug:  cetirizine HCl TAKE 2 TEASPOONfulls (10 ML) BY MOUTH DAILY   clonazePAM 0.5 MG tablet Commonly known as:  KLONOPIN TAKE 1 TABLET BY MOUTH TWICE A DAY AS NEEDED FOR ANXIETY   diclofenac sodium 1 % Gel Commonly known as:  VOLTAREN Apply 1 application topically daily as needed.   DULoxetine 60 MG capsule Commonly known as:  CYMBALTA Take 1 capsule by mouth 2 (two) times daily.   Dymista 137-50 MCG/ACT Susp Generic drug:  Azelastine-Fluticasone Place 1 spray into the nose daily.   gabapentin 600 MG tablet Commonly known as:  NEURONTIN Take 1 tablet by mouth 4 (four) times daily.   hydrocortisone 20 MG tablet Commonly known as:  CORTEF Take 1 tablet in the morning and 1/2 tablet at 4pm   levothyroxine 50 MCG tablet Commonly known as:  SYNTHROID, LEVOTHROID Take 1 tablet by mouth every morning.   lidocaine 5 % Commonly known as:  LIDODERM Place 1 patch onto the skin daily as needed. (12 hours on and 12 hours off)   montelukast 10 MG tablet Commonly known as:  SINGULAIR Take 1 tablet by mouth daily.   Multi-Vitamins Tabs Take 1 tablet by mouth daily.   naloxone 4 MG/0.1ML Liqd nasal spray kit Commonly known as:  NARCAN Place 1 spray into the nose once. (for suspected overdose)   omeprazole 40 MG capsule Commonly known as:  PRILOSEC TAKE 1 CAPSULE BY MOUTH TWICE A DAY   oxyCODONE-acetaminophen 10-325 MG tablet Commonly known as:  PERCOCET TAKE 1 TAB EVERY 6 HOURS FOR 5 DAYS THEN 1 TAB TWICE A  DAY AS NEEDED FOR SEVERE BREAKTHROUGH PAIN   Spiriva Respimat 2.5 MCG/ACT Aers Generic drug:  Tiotropium Bromide Monohydrate Inhale into the lungs.   tiZANidine 4 MG tablet Commonly known as:  ZANAFLEX Take 1 tablet by mouth 2 (two) times daily.   traZODone 100 MG tablet Commonly known as:  DESYREL Take 1-2 tablets at bedtime   Trelegy Ellipta 100-62.5-25 MCG/INH Aepb Generic drug:  Fluticasone-Umeclidin-Vilant INHALE 1 PUFF BY INHALATION ROUTE ONCE DAILY AT THE SAME TIME EACH DAY   Vitamin D (Ergocalciferol) 1.25 MG (50000 UT) Caps capsule Commonly known as:  DRISDOL Take 1 capsule by mouth once a week.  Review of systems negative except as noted in HPI / PMHx or noted below:  Review of Systems  Constitutional: Negative.   HENT: Negative.   Eyes: Negative.   Respiratory: Negative.   Cardiovascular: Negative.   Gastrointestinal: Negative.   Genitourinary: Negative.   Musculoskeletal: Negative.   Skin: Negative.   Neurological: Negative.   Endo/Heme/Allergies: Negative.   Psychiatric/Behavioral: Negative.     Family History  Problem Relation Age of Onset  . Supraventricular tachycardia Mother   . Dementia Mother   . Hyperlipidemia Mother   . Hypertension Mother   . Osteoarthritis Mother   . Osteoporosis Mother   . Breast cancer Other   . Breast cancer Other   . Colon cancer Neg Hx   . Esophageal cancer Neg Hx     Social History   Socioeconomic History  . Marital status: Divorced    Spouse name: Not on file  . Number of children: 1  . Years of education: Not on file  . Highest education level: Not on file  Occupational History  . Not on file  Social Needs  . Financial resource strain: Not on file  . Food insecurity:    Worry: Not on file    Inability: Not on file  . Transportation needs:    Medical: Not on file    Non-medical: Not on file  Tobacco Use  . Smoking status: Current Every Day Smoker    Packs/day: 2.00  . Smokeless tobacco:  Never Used  Substance and Sexual Activity  . Alcohol use: Not Currently    Comment: quit 19 years  . Drug use: Never  . Sexual activity: Not on file  Lifestyle  . Physical activity:    Days per week: Not on file    Minutes per session: Not on file  . Stress: Not on file  Relationships  . Social connections:    Talks on phone: Not on file    Gets together: Not on file    Attends religious service: Not on file    Active member of club or organization: Not on file    Attends meetings of clubs or organizations: Not on file    Relationship status: Not on file  . Intimate partner violence:    Fear of current or ex partner: Not on file    Emotionally abused: Not on file    Physically abused: Not on file    Forced sexual activity: Not on file  Other Topics Concern  . Not on file  Social History Narrative  . Not on file    Environmental and Social history  Lives in a house with a dry environment, 5 cats located inside the household, carpet in the bedroom, plastic on the bed, no plastic on the pillow, and actively smoking tobacco products.  Objective:   Vitals:   09/01/18 1409  BP: 108/72  Pulse: 88  Resp: 20  Temp: 99 F (37.2 C)  SpO2: 93%   Height: _0  (157.5 cm) Weight: 134 lb 6.4 oz (61 kg)  Physical Exam Constitutional:      Appearance: She is not diaphoretic.  HENT:     Head: Normocephalic. No right periorbital erythema or left periorbital erythema.     Right Ear: Tympanic membrane, ear canal and external ear normal.     Left Ear: Ear canal and external ear normal. Tympanic membrane is scarred.     Nose: Nose normal. No mucosal edema or rhinorrhea.     Mouth/Throat:  Pharynx: No oropharyngeal exudate.  Eyes:     General: Lids are normal.     Conjunctiva/sclera: Conjunctivae normal.     Pupils: Pupils are equal, round, and reactive to light.  Neck:     Thyroid: No thyromegaly.     Trachea: Trachea normal. No tracheal deviation.  Cardiovascular:      Rate and Rhythm: Normal rate and regular rhythm.     Heart sounds: Normal heart sounds, S1 normal and S2 normal. No murmur.  Pulmonary:     Effort: Pulmonary effort is normal. No respiratory distress.     Breath sounds: No stridor. No wheezing or rales.  Chest:     Chest wall: No tenderness.  Abdominal:     General: There is no distension.     Palpations: Abdomen is soft. There is no mass.     Tenderness: There is no abdominal tenderness. There is no guarding or rebound.  Musculoskeletal:        General: No tenderness.  Lymphadenopathy:     Head:     Right side of head: No tonsillar adenopathy.     Left side of head: No tonsillar adenopathy.     Cervical: No cervical adenopathy.  Skin:    Coloration: Skin is not pale.     Findings: No erythema or rash.     Nails: There is no clubbing.   Neurological:     Mental Status: She is alert.     Diagnostics: Allergy skin tests were performed.  She demonstrated hypersensitivity to house dust mite, cat, dog, and mold.  Spirometry was performed and demonstrated an FEV1 of 1.58 @ 71 % of predicted. FEV1/FVC = 0.65  Results of a chest CT scan obtained 18 July 2018 identified the following:  Cardiovascular: Atherosclerosis of thoracic aorta is noted without aneurysm formation. Normal cardiac size. No pericardial effusion is noted.  Mediastinum/Nodes: No enlarged mediastinal or axillary lymph nodes. Thyroid gland, trachea, and esophagus demonstrate no significant findings. Nodule seen in right tracheoesophageal groove on prior exam is not visualized currently.  Lungs/Pleura: No pneumothorax or pleural effusion is noted. Emphysematous disease is noted in the upper lobes bilaterally. Stable 8 mm ground-glass opacity is noted in left lung apex best seen on image number 17 of series 3. Stable 11 mm sub solid density is noted in subpleural location in left upper lobe best seen on image number 22 of series 3. 11 x 7 mm grossly stable  irregular abnormality is noted in left upper lobe best seen on image number 36 of series 3. Stable 5 mm irregular density is noted in right lung apex best seen on image number 14 of series 3. Stable 1 cm sub solid density seen in right upper lobe best seen on image number 36 of series 3.  Results of blood tests obtained 09 May 2018 identified creatinine 0.72 mg/DL, WBC 14.2, hemoglobin 16.2, platelet 348  Assessment and Plan:    1. Perennial allergic rhinitis   2. Seasonal allergic rhinitis due to fungal spores   3. COPD with asthma ([place])   4. Heavy tobacco smoker >10 cigarettes per day     1.  Allergen avoidance measures  2.  Remain away from oral antihistamine use (cetirizine)  3.  Consider using nicotine substitutes to avoid smoke exposure  4.  Treat and prevent inflammation:   A.  Dymista 1 spray each nostril twice a day  B.  Montelukast 10 mg tablet once a day  5.  If needed:  A.  OTC Systane gel drops at night  B.  OTC Systane drops during the day  C.  OTC nasal saline  D.  Generic Patanol 1 drop each eye twice a day  6.  Continue therapy directed by pulmonologist for COPD /asthma  7.  Consider a course of immunotherapy  8.  Obtain an eye exam with an ophthalmologist  9.  Return to clinic in 4 weeks or earlier if problem  Lorna does appear to have atopic disease and we will get her to perform allergen avoidance measures as best as possible and utilize a combination of anti-inflammatory agents for her airway and hopefully get her to eliminate smoke exposure in the future.  I suspect she may also have a component of dry eye syndrome and we will get her to eliminate any oral antihistamines and she can use wetting solutions as noted above.  I would like for her to revisit with her ophthalmologist especially given the fact that she is having difficulty seeing with occasional double vision and has a history of cataract formation.  She obviously has architectural  changes within her lung as a result of ongoing tobacco use and I had a talk with her today about the need to find a substitute for her smoke exposure.  She would definitely be a candidate for immunotherapy and I have given her literature on this form of treatment during today's visit.  I will regroup with her in 4 weeks or earlier if there is a problem.  Jiles Prows, MD Allergy / Immunology [facility]

## 2018-09-01 NOTE — Patient Instructions (Addendum)
  1.  Allergen avoidance measures  2.  Remain away from oral antihistamine use (cetirizine)  3.  Consider using nicotine substitutes to avoid smoke exposure  4.  Treat and prevent inflammation:   A.  Dymista 1 spray each nostril twice a day  B.  Montelukast 10 mg tablet once a day  5.  If needed:   A.  OTC Systane gel drops at night  B.  OTC Systane drops during the day  C.  OTC nasal saline  D.  Generic Patanol 1 drop each eye twice a day  6.  Continue therapy directed by pulmonologist for COPD /asthma  7.  Consider a course of immunotherapy  8.  Obtain an eye exam with an ophthalmologist  9.  Return to clinic in 4 weeks or earlier if problem

## 2018-09-02 ENCOUNTER — Encounter: Payer: Self-pay | Admitting: Allergy and Immunology

## 2018-09-03 ENCOUNTER — Other Ambulatory Visit: Payer: Self-pay | Admitting: Gastroenterology

## 2018-09-03 DIAGNOSIS — M5416 Radiculopathy, lumbar region: Secondary | ICD-10-CM | POA: Diagnosis not present

## 2018-09-03 DIAGNOSIS — M961 Postlaminectomy syndrome, not elsewhere classified: Secondary | ICD-10-CM | POA: Diagnosis not present

## 2018-09-03 DIAGNOSIS — M5412 Radiculopathy, cervical region: Secondary | ICD-10-CM | POA: Diagnosis not present

## 2018-09-03 DIAGNOSIS — Z981 Arthrodesis status: Secondary | ICD-10-CM | POA: Diagnosis not present

## 2018-09-05 DIAGNOSIS — J3089 Other allergic rhinitis: Secondary | ICD-10-CM | POA: Diagnosis not present

## 2018-09-05 DIAGNOSIS — M545 Low back pain: Secondary | ICD-10-CM | POA: Diagnosis not present

## 2018-09-05 DIAGNOSIS — J410 Simple chronic bronchitis: Secondary | ICD-10-CM | POA: Diagnosis not present

## 2018-09-05 DIAGNOSIS — M546 Pain in thoracic spine: Secondary | ICD-10-CM | POA: Diagnosis not present

## 2018-09-05 DIAGNOSIS — M5412 Radiculopathy, cervical region: Secondary | ICD-10-CM | POA: Diagnosis not present

## 2018-09-22 DIAGNOSIS — J018 Other acute sinusitis: Secondary | ICD-10-CM | POA: Diagnosis not present

## 2018-09-30 DIAGNOSIS — H6982 Other specified disorders of Eustachian tube, left ear: Secondary | ICD-10-CM | POA: Diagnosis not present

## 2018-10-01 ENCOUNTER — Ambulatory Visit: Payer: Medicare Other | Admitting: Allergy and Immunology

## 2018-10-16 DIAGNOSIS — H7412 Adhesive left middle ear disease: Secondary | ICD-10-CM | POA: Diagnosis not present

## 2018-10-16 DIAGNOSIS — H6982 Other specified disorders of Eustachian tube, left ear: Secondary | ICD-10-CM | POA: Diagnosis not present

## 2018-11-03 DIAGNOSIS — J449 Chronic obstructive pulmonary disease, unspecified: Secondary | ICD-10-CM | POA: Diagnosis not present

## 2018-11-03 DIAGNOSIS — E782 Mixed hyperlipidemia: Secondary | ICD-10-CM | POA: Diagnosis not present

## 2018-11-03 DIAGNOSIS — M545 Low back pain: Secondary | ICD-10-CM | POA: Diagnosis not present

## 2018-11-03 DIAGNOSIS — R7301 Impaired fasting glucose: Secondary | ICD-10-CM | POA: Diagnosis not present

## 2018-11-05 ENCOUNTER — Encounter: Payer: Self-pay | Admitting: Allergy and Immunology

## 2018-11-05 ENCOUNTER — Ambulatory Visit (INDEPENDENT_AMBULATORY_CARE_PROVIDER_SITE_OTHER): Payer: Medicare Other | Admitting: Allergy and Immunology

## 2018-11-05 ENCOUNTER — Other Ambulatory Visit: Payer: Self-pay

## 2018-11-05 DIAGNOSIS — J3089 Other allergic rhinitis: Secondary | ICD-10-CM | POA: Diagnosis not present

## 2018-11-05 DIAGNOSIS — F17210 Nicotine dependence, cigarettes, uncomplicated: Secondary | ICD-10-CM | POA: Diagnosis not present

## 2018-11-05 DIAGNOSIS — J449 Chronic obstructive pulmonary disease, unspecified: Secondary | ICD-10-CM

## 2018-11-05 NOTE — Progress Notes (Signed)
[facility] - [place] - [place]   Follow-up Note  Referring Provider: [name], MD Primary Provider: [name], MD Date of Office Visit: 11/05/2018  Subjective:   Connie Coffey (DOB: [birth year]) is a 63 y.o. female who returns to the [facility] on 11/05/2018 in re-evaluation of the following:  HPI: This is a E - Med visit requested by patient who is located at home.  Connie Coffey is followed in this clinic for allergic rhinitis and COPD with asthma and tobacco smoke exposure.  Her last visit to this clinic was her initial evaluation of 01 September 2018.  Ear infection for the past couple of months. Sinus infection for the couple of months. Three antibiotics since her last visit. Sinuses bother her by being intermittently clogged up and headaches. Nose blowing clear. Eyes better.   Has not performed dust mite avoidance and the cats sleep in the bed.   Still smoking about 1 PPD.   Continue to use medications for COPD from pulmonogist   Allergies as of 11/05/2018      Reactions   Celebrex [celecoxib]    "because of stomach ulcers"   Nsaids Other (See Comments)   GI Upset   Other    beets   Penicillins Rash      Medication List      albuterol 108 (90 Base) MCG/ACT inhaler Commonly known as:  VENTOLIN HFA INHALE 1 PUFF EVERY 4 HOURS AS NEEDED.   alendronate 70 MG tablet Commonly known as:  FOSAMAX Take 1 tablet by mouth once a week.   atorvastatin 40 MG tablet Commonly known as:  LIPITOR Take 1 tablet by mouth daily.   busPIRone 30 MG tablet Commonly known as:  BUSPAR Take 1 tablet by mouth 2 (two) times daily.   clonazePAM 0.5 MG tablet Commonly known as:  KLONOPIN TAKE 1 TABLET BY MOUTH TWICE A DAY AS NEEDED FOR ANXIETY   diclofenac sodium 1 % Gel Commonly known as:  VOLTAREN Apply 1 application topically daily as needed.   DULoxetine 60 MG capsule Commonly known as:  CYMBALTA Take 1 capsule by mouth 2 (two) times  daily.   Dymista 137-50 MCG/ACT Susp Generic drug:  Azelastine-Fluticasone Place 1 spray into the nose daily.   gabapentin 600 MG tablet Commonly known as:  NEURONTIN Take 1 tablet by mouth 4 (four) times daily.   hydrocortisone 20 MG tablet Commonly known as:  CORTEF Take 1 tablet in the morning and 1/2 tablet at 4pm   levothyroxine 50 MCG tablet Commonly known as:  SYNTHROID Take 1 tablet by mouth every morning.   lidocaine 5 % Commonly known as:  LIDODERM Place 1 patch onto the skin daily as needed. (12 hours on and 12 hours off)   montelukast 10 MG tablet Commonly known as:  SINGULAIR Take 1 tablet by mouth daily.   Multi-Vitamins Tabs Take 1 tablet by mouth daily.   naloxone 4 MG/0.1ML Liqd nasal spray kit Commonly known as:  NARCAN Place 1 spray into the nose once. (for suspected overdose)   olopatadine 0.1 % ophthalmic solution Commonly known as:  PATANOL Use 1 drop in each eye twice daily as needed   omeprazole 40 MG capsule Commonly known as:  PRILOSEC TAKE 1 CAPSULE BY MOUTH TWICE A DAY   oxyCODONE-acetaminophen 10-325 MG tablet Commonly known as:  PERCOCET TAKE 1 TAB EVERY 6 HOURS FOR 5 DAYS THEN 1 TAB TWICE A DAY AS NEEDED FOR SEVERE BREAKTHROUGH  PAIN   Spiriva Respimat 2.5 MCG/ACT Aers Generic drug:  Tiotropium Bromide Monohydrate Inhale into the lungs.   tiZANidine 4 MG tablet Commonly known as:  ZANAFLEX Take 1 tablet by mouth 2 (two) times daily.   traZODone 100 MG tablet Commonly known as:  DESYREL Take 1-2 tablets at bedtime   Vitamin D (Ergocalciferol) 1.25 MG (50000 UT) Caps capsule Commonly known as:  DRISDOL Take 1 capsule by mouth once a week.       Past Medical History:  Diagnosis Date  . Adrenal insufficiency ([place])   . Anxiety   . Arthritis   . Carpal tunnel syndrome    Both hands, worse in right  . Chronic constipation   . COPD (chronic obstructive pulmonary disease) ([place])   . CVA (cerebral vascular accident) ([place])    . Facet syndrome   . Fibromyalgia   . GERD (gastroesophageal reflux disease)   . Heart murmur   . Hypercholesteremia   . IBS (irritable bowel syndrome)   . Ischemic stroke ([place])    07/19/2004  . Lumbar radiculopathy   . Primary hyperparathyroidism ([place])   . RLS (restless legs syndrome)   . Spinal stenosis   . TMJ (temporomandibular joint disorder)   . Trochanteric bursitis of both hips   . Vascular malformation    spinal cord    Past Surgical History:  Procedure Laterality Date  . APPENDECTOMY    . CARPAL TUNNEL RELEASE Right 12/13/2017   surgery  . CHOLECYSTECTOMY    . COLONOSCOPY  05/06/2017   Colonic polyps status post polypectomy. Interan land external hemorrhoids.   . ESOPHAGOGASTRODUODENOSCOPY  04/28/2007   Mild gastrtiis. Otherwise normal EGD.   Marland Kitchen HEMORROIDECTOMY    . LAMINECTOMY AND MICRODISCECTOMY LUMBAR SPINE  01/03/2005  . NECK SURGERY     x2   . OTHER SURGICAL HISTORY  04/24/2004   anterior cervical discectomy and fusion at C5-C6  . TONSILLECTOMY    . TOTAL ABDOMINAL HYSTERECTOMY      Review of systems negative except as noted in HPI / PMHx or noted below:  Review of Systems  Constitutional: Negative.   HENT: Negative.   Eyes: Negative.   Respiratory: Negative.   Cardiovascular: Negative.   Gastrointestinal: Negative.   Genitourinary: Negative.   Musculoskeletal: Negative.   Skin: Negative.   Neurological: Negative.   Endo/Heme/Allergies: Negative.   Psychiatric/Behavioral: Negative.      Objective:   There were no vitals filed for this visit.        Physical Exam Constitutional:      Appearance: She is not diaphoretic.  HENT:     Head: Normocephalic.     Right Ear: Tympanic membrane, ear canal and external ear normal.     Left Ear: Tympanic membrane, ear canal and external ear normal.     Nose: Nose normal. No mucosal edema or rhinorrhea.     Mouth/Throat:     Pharynx: Uvula midline. No oropharyngeal exudate.  Eyes:      Conjunctiva/sclera: Conjunctivae normal.  Neck:     Thyroid: No thyromegaly.     Trachea: Trachea normal. No tracheal tenderness or tracheal deviation.  Cardiovascular:     Rate and Rhythm: Normal rate and regular rhythm.     Heart sounds: Normal heart sounds, S1 normal and S2 normal. No murmur.  Pulmonary:     Effort: No respiratory distress.     Breath sounds: Normal breath sounds. No stridor. No wheezing or rales.  Lymphadenopathy:  Head:     Right side of head: No tonsillar adenopathy.     Left side of head: No tonsillar adenopathy.     Cervical: No cervical adenopathy.  Skin:    Findings: No erythema or rash.     Nails: There is no clubbing.   Neurological:     Mental Status: She is alert.     Diagnostics: none  Assessment and Plan:   1. Perennial allergic rhinitis   2. COPD with asthma ([place])   3. Heavy tobacco smoker >10 cigarettes per day     1.  Allergen avoidance measures - dust mite  2.  Remain away from oral antihistamine use (cetirizine)  3.  Consider using nicotine substitutes to avoid smoke exposure  4.  Treat and prevent inflammation:   A.  Dymista 1 spray each nostril twice a day  B.  Montelukast 10 mg tablet once a day  5.  If needed:   A.  OTC Systane gel drops at night  B.  OTC Systane drops during the day  C.  OTC nasal saline  D.  Generic Patanol 1 drop each eye twice a day  6.  Continue therapy directed by pulmonologist for COPD /asthma  7.  Start immunotherapy against dust mite, cat, dog  8.  Return to clinic in 12 weeks or earlier if problem  Aulani has failed medical therapy and she is a candidate for immunotherapy and she will be starting that therapy at some point in the near future.  I have given her some advice once again about dust mite avoidance measures during today's encounter.  It is obvious that the cats are not going to be removed from the bedroom.  It does not sound like she is going to stop smoking anytime soon.  She  will continue on a collection of anti-inflammatory agents for her airway and eyes directed from this clinic and for her lungs directed from her pulmonologist.  I will see her back in this clinic in 12 weeks or earlier if there is a problem.  Allena Katz, MD Allergy / Immunology [facility]

## 2018-11-05 NOTE — Patient Instructions (Addendum)
  1.  Allergen avoidance measures - dust mite  2.  Remain away from oral antihistamine use (cetirizine)  3.  Consider using nicotine substitutes to avoid smoke exposure  4.  Treat and prevent inflammation:   A.  Dymista 1 spray each nostril twice a day  B.  Montelukast 10 mg tablet once a day  5.  If needed:   A.  OTC Systane gel drops at night  B.  OTC Systane drops during the day  C.  OTC nasal saline  D.  Generic Patanol 1 drop each eye twice a day  6.  Continue therapy directed by pulmonologist for COPD /asthma  7.  Start immunotherapy against dust mite, cat, dog  8. Return to clinic in 12 weeks or earlier if problem

## 2018-11-06 ENCOUNTER — Encounter: Payer: Self-pay | Admitting: Allergy and Immunology

## 2018-11-06 DIAGNOSIS — M5416 Radiculopathy, lumbar region: Secondary | ICD-10-CM | POA: Diagnosis not present

## 2018-11-13 DIAGNOSIS — H43813 Vitreous degeneration, bilateral: Secondary | ICD-10-CM | POA: Diagnosis not present

## 2018-11-13 DIAGNOSIS — H524 Presbyopia: Secondary | ICD-10-CM | POA: Diagnosis not present

## 2018-11-25 DIAGNOSIS — E782 Mixed hyperlipidemia: Secondary | ICD-10-CM | POA: Diagnosis not present

## 2018-12-08 DIAGNOSIS — M5416 Radiculopathy, lumbar region: Secondary | ICD-10-CM | POA: Diagnosis not present

## 2018-12-08 DIAGNOSIS — M5412 Radiculopathy, cervical region: Secondary | ICD-10-CM | POA: Diagnosis not present

## 2018-12-10 DIAGNOSIS — M545 Low back pain: Secondary | ICD-10-CM | POA: Diagnosis not present

## 2018-12-11 DIAGNOSIS — M545 Low back pain: Secondary | ICD-10-CM | POA: Diagnosis not present

## 2018-12-16 DIAGNOSIS — M5134 Other intervertebral disc degeneration, thoracic region: Secondary | ICD-10-CM | POA: Diagnosis not present

## 2018-12-16 DIAGNOSIS — R030 Elevated blood-pressure reading, without diagnosis of hypertension: Secondary | ICD-10-CM | POA: Diagnosis not present

## 2018-12-19 DIAGNOSIS — M5134 Other intervertebral disc degeneration, thoracic region: Secondary | ICD-10-CM | POA: Diagnosis not present

## 2018-12-19 DIAGNOSIS — S299XXA Unspecified injury of thorax, initial encounter: Secondary | ICD-10-CM | POA: Diagnosis not present

## 2018-12-19 DIAGNOSIS — M549 Dorsalgia, unspecified: Secondary | ICD-10-CM | POA: Diagnosis not present

## 2018-12-19 NOTE — Progress Notes (Signed)
Telephone Visit:  Patient is at home. Provider is in the office.  Start time: 4:29 pm End time: 4:48 pm Verbal consent to bill insurance was given.

## 2018-12-22 DIAGNOSIS — Z8639 Personal history of other endocrine, nutritional and metabolic disease: Secondary | ICD-10-CM | POA: Insufficient documentation

## 2018-12-22 DIAGNOSIS — E213 Hyperparathyroidism, unspecified: Secondary | ICD-10-CM | POA: Diagnosis not present

## 2018-12-22 DIAGNOSIS — M810 Age-related osteoporosis without current pathological fracture: Secondary | ICD-10-CM | POA: Diagnosis not present

## 2018-12-22 DIAGNOSIS — E039 Hypothyroidism, unspecified: Secondary | ICD-10-CM | POA: Diagnosis not present

## 2018-12-22 DIAGNOSIS — E2740 Unspecified adrenocortical insufficiency: Secondary | ICD-10-CM | POA: Diagnosis not present

## 2019-01-06 DIAGNOSIS — R350 Frequency of micturition: Secondary | ICD-10-CM | POA: Diagnosis not present

## 2019-01-06 DIAGNOSIS — J018 Other acute sinusitis: Secondary | ICD-10-CM | POA: Diagnosis not present

## 2019-01-13 DIAGNOSIS — J018 Other acute sinusitis: Secondary | ICD-10-CM | POA: Diagnosis not present

## 2019-01-13 DIAGNOSIS — R5383 Other fatigue: Secondary | ICD-10-CM | POA: Diagnosis not present

## 2019-01-22 DIAGNOSIS — M5134 Other intervertebral disc degeneration, thoracic region: Secondary | ICD-10-CM | POA: Diagnosis not present

## 2019-02-06 DIAGNOSIS — Z23 Encounter for immunization: Secondary | ICD-10-CM | POA: Diagnosis not present

## 2019-02-06 DIAGNOSIS — R7301 Impaired fasting glucose: Secondary | ICD-10-CM | POA: Diagnosis not present

## 2019-02-06 DIAGNOSIS — M545 Low back pain: Secondary | ICD-10-CM | POA: Diagnosis not present

## 2019-02-06 DIAGNOSIS — J449 Chronic obstructive pulmonary disease, unspecified: Secondary | ICD-10-CM | POA: Diagnosis not present

## 2019-02-06 DIAGNOSIS — E782 Mixed hyperlipidemia: Secondary | ICD-10-CM | POA: Diagnosis not present

## 2019-02-09 DIAGNOSIS — M5134 Other intervertebral disc degeneration, thoracic region: Secondary | ICD-10-CM | POA: Insufficient documentation

## 2019-02-16 DIAGNOSIS — K648 Other hemorrhoids: Secondary | ICD-10-CM | POA: Diagnosis not present

## 2019-02-16 DIAGNOSIS — Z860100 Personal history of colon polyps, unspecified: Secondary | ICD-10-CM | POA: Insufficient documentation

## 2019-03-03 DIAGNOSIS — M5412 Radiculopathy, cervical region: Secondary | ICD-10-CM | POA: Diagnosis not present

## 2019-03-03 DIAGNOSIS — M5416 Radiculopathy, lumbar region: Secondary | ICD-10-CM | POA: Diagnosis not present

## 2019-03-03 DIAGNOSIS — M5134 Other intervertebral disc degeneration, thoracic region: Secondary | ICD-10-CM | POA: Diagnosis not present

## 2019-03-04 ENCOUNTER — Other Ambulatory Visit: Payer: Self-pay | Admitting: Gastroenterology

## 2019-05-05 DIAGNOSIS — M5134 Other intervertebral disc degeneration, thoracic region: Secondary | ICD-10-CM | POA: Diagnosis not present

## 2019-05-12 DIAGNOSIS — K648 Other hemorrhoids: Secondary | ICD-10-CM | POA: Diagnosis not present

## 2019-05-12 DIAGNOSIS — Z86010 Personal history of colonic polyps: Secondary | ICD-10-CM | POA: Diagnosis not present

## 2019-05-19 DIAGNOSIS — E782 Mixed hyperlipidemia: Secondary | ICD-10-CM | POA: Diagnosis not present

## 2019-05-19 DIAGNOSIS — R7301 Impaired fasting glucose: Secondary | ICD-10-CM | POA: Diagnosis not present

## 2019-05-19 DIAGNOSIS — E034 Atrophy of thyroid (acquired): Secondary | ICD-10-CM | POA: Diagnosis not present

## 2019-05-19 DIAGNOSIS — E2749 Other adrenocortical insufficiency: Secondary | ICD-10-CM | POA: Diagnosis not present

## 2019-05-19 DIAGNOSIS — J410 Simple chronic bronchitis: Secondary | ICD-10-CM | POA: Diagnosis not present

## 2019-05-20 DIAGNOSIS — M79641 Pain in right hand: Secondary | ICD-10-CM | POA: Diagnosis not present

## 2019-05-20 DIAGNOSIS — E782 Mixed hyperlipidemia: Secondary | ICD-10-CM | POA: Diagnosis not present

## 2019-05-20 DIAGNOSIS — R600 Localized edema: Secondary | ICD-10-CM | POA: Diagnosis not present

## 2019-05-20 DIAGNOSIS — M79642 Pain in left hand: Secondary | ICD-10-CM | POA: Diagnosis not present

## 2019-05-20 DIAGNOSIS — R7301 Impaired fasting glucose: Secondary | ICD-10-CM | POA: Diagnosis not present

## 2019-06-01 DIAGNOSIS — M5416 Radiculopathy, lumbar region: Secondary | ICD-10-CM | POA: Diagnosis not present

## 2019-06-01 DIAGNOSIS — M5134 Other intervertebral disc degeneration, thoracic region: Secondary | ICD-10-CM | POA: Diagnosis not present

## 2019-06-01 DIAGNOSIS — R030 Elevated blood-pressure reading, without diagnosis of hypertension: Secondary | ICD-10-CM | POA: Diagnosis not present

## 2019-06-01 DIAGNOSIS — M5412 Radiculopathy, cervical region: Secondary | ICD-10-CM | POA: Diagnosis not present

## 2019-06-04 DIAGNOSIS — Z86010 Personal history of colonic polyps: Secondary | ICD-10-CM | POA: Diagnosis not present

## 2019-06-04 DIAGNOSIS — Z09 Encounter for follow-up examination after completed treatment for conditions other than malignant neoplasm: Secondary | ICD-10-CM | POA: Diagnosis not present

## 2019-06-04 DIAGNOSIS — Z1211 Encounter for screening for malignant neoplasm of colon: Secondary | ICD-10-CM | POA: Diagnosis not present

## 2019-06-04 LAB — HM COLONOSCOPY

## 2019-06-30 ENCOUNTER — Other Ambulatory Visit: Payer: Self-pay

## 2019-06-30 ENCOUNTER — Ambulatory Visit (INDEPENDENT_AMBULATORY_CARE_PROVIDER_SITE_OTHER): Payer: Medicare Other

## 2019-06-30 VITALS — BP 138/64 | HR 92 | Temp 97.9°F | Resp 18 | Ht 62.0 in | Wt 131.0 lb

## 2019-06-30 DIAGNOSIS — Z0000 Encounter for general adult medical examination without abnormal findings: Secondary | ICD-10-CM | POA: Diagnosis not present

## 2019-06-30 DIAGNOSIS — Z0001 Encounter for general adult medical examination with abnormal findings: Secondary | ICD-10-CM

## 2019-06-30 NOTE — Progress Notes (Signed)
Subjective:   Connie Coffey is a 64 y.o. female who presents for Medicare Annual (Subsequent) preventive examination.  Review of Systems:  Review of Systems  Constitutional: Negative.   HENT: Negative.   Respiratory: Positive for cough.   Cardiovascular: Negative.   Gastrointestinal: Negative.   Musculoskeletal: Positive for arthralgias, back pain and neck pain.  Neurological: Positive for numbness.  Psychiatric/Behavioral: Positive for sleep disturbance. The patient is nervous/anxious.     Cardiac Risk Factors include: smoking/ tobacco exposure;hypertension     Objective:     Vitals: BP 138/64 (BP Location: Left Arm, Patient Position: Sitting, Cuff Size: Large)   Pulse 92   Temp 97.9 F (36.6 C) (Temporal)   Resp 18   Ht 5\' 2"  (1.575 m)   Wt 131 lb (59.4 kg)   SpO2 94%   BMI 23.96 kg/m   Body mass index is 23.96 kg/m.  Advanced Directives 06/30/2019  Does Patient Have a Medical Advance Directive? Yes  Type of Advance Directive [place]  Does patient want to make changes to medical advance directive? No - Patient declined    Tobacco Social History   Tobacco Use  Smoking Status Current Every Day Smoker  . Packs/day: 1.50  . Types: Cigarettes  Smokeless Tobacco Never Used     Ready to quit: No Counseling given: Yes   Clinical Intake:  Pre-visit preparation completed: Yes  Pain Score: 3      Diabetes: Yes           Past Medical History:  Diagnosis Date  . Adrenal insufficiency ([place])   . Anxiety   . Arthritis   . Carpal tunnel syndrome    Both hands, worse in right  . Chronic constipation   . COPD (chronic obstructive pulmonary disease) ([place])   . CVA (cerebral vascular accident) ([place])   . Facet syndrome   . Fibromyalgia   . GERD (gastroesophageal reflux disease)   . Heart murmur   . Hypercholesteremia   . IBS (irritable bowel syndrome)   . Ischemic stroke ([place])    07/19/2004  . Lumbar radiculopathy   . Primary  hyperparathyroidism ([place])   . RLS (restless legs syndrome)   . Spinal stenosis   . TMJ (temporomandibular joint disorder)   . Trochanteric bursitis of both hips   . Vascular malformation    spinal cord   Past Surgical History:  Procedure Laterality Date  . APPENDECTOMY    . CARPAL TUNNEL RELEASE Right 12/13/2017   surgery  . CHOLECYSTECTOMY    . COLONOSCOPY  05/06/2017   Colonic polyps status post polypectomy. Interan land external hemorrhoids.   . ESOPHAGOGASTRODUODENOSCOPY  04/28/2007   Mild gastrtiis. Otherwise normal EGD.   Marland Kitchen HEMORROIDECTOMY    . LAMINECTOMY AND MICRODISCECTOMY LUMBAR SPINE  01/03/2005  . NECK SURGERY     x2   . OTHER SURGICAL HISTORY  04/24/2004   anterior cervical discectomy and fusion at C5-C6  . TONSILLECTOMY    . TOTAL ABDOMINAL HYSTERECTOMY     Family History  Problem Relation Age of Onset  . Supraventricular tachycardia Mother   . Dementia Mother   . Hyperlipidemia Mother   . Hypertension Mother   . Osteoarthritis Mother   . Osteoporosis Mother   . Breast cancer Other   . Breast cancer Other   . Colon cancer Neg Hx   . Esophageal cancer Neg Hx    Social History   Socioeconomic History  . Marital status: Divorced  Spouse name: Not on file  . Number of children: 1  . Years of education: Not on file  . Highest education level: Not on file  Occupational History  . Occupation: Disabled  Tobacco Use  . Smoking status: Current Every Day Smoker    Packs/day: 1.50    Types: Cigarettes  . Smokeless tobacco: Never Used  Substance and Sexual Activity  . Alcohol use: Not Currently    Comment: quit 19 years  . Drug use: Never  . Sexual activity: Not on file  Other Topics Concern  . Not on file  Social History Narrative  . Not on file   Social Determinants of Health   Financial Resource Strain:   . Difficulty of Paying Living Expenses: Not on file  Food Insecurity: No Food Insecurity  . Worried About Charity fundraiser in the Last  Year: Never true  . Ran Out of Food in the Last Year: Never true  Transportation Needs: No Transportation Needs  . Lack of Transportation (Medical): No  . Lack of Transportation (Non-Medical): No  Physical Activity:   . Days of Exercise per Week: Not on file  . Minutes of Exercise per Session: Not on file  Stress: Stress Concern Present  . Feeling of Stress : Very much  Social Connections:   . Frequency of Communication with Friends and Family: Not on file  . Frequency of Social Gatherings with Friends and Family: Not on file  . Attends Religious Services: Not on file  . Active Member of Clubs or Organizations: Not on file  . Attends Archivist Meetings: Not on file  . Marital Status: Not on file    Outpatient Encounter Medications as of 06/30/2019  Medication Sig  . albuterol (PROVENTIL HFA;VENTOLIN HFA) 108 (90 Base) MCG/ACT inhaler INHALE 1 PUFF EVERY 4 HOURS AS NEEDED.  Marland Kitchen alendronate (FOSAMAX) 70 MG tablet Take 1 tablet by mouth once a week.  Marland Kitchen atorvastatin (LIPITOR) 40 MG tablet Take 1 tablet by mouth daily.  . busPIRone (BUSPAR) 30 MG tablet Take 1 tablet by mouth 2 (two) times daily.  . clonazePAM (KLONOPIN) 0.5 MG tablet TAKE 1 TABLET BY MOUTH TWICE A DAY AS NEEDED FOR ANXIETY  . diclofenac sodium (VOLTAREN) 1 % GEL Apply 1 application topically daily as needed.  . DULoxetine (CYMBALTA) 60 MG capsule Take 1 capsule by mouth 2 (two) times daily.  Marland Kitchen DYMISTA 137-50 MCG/ACT SUSP Place 1 spray into the nose daily.  Marland Kitchen gabapentin (NEURONTIN) 600 MG tablet Take 1 tablet by mouth 4 (four) times daily.  . hydrocortisone (CORTEF) 20 MG tablet Take 1 tablet in the morning and 1/2 tablet at 4pm  . levothyroxine (SYNTHROID, LEVOTHROID) 50 MCG tablet Take 1 tablet by mouth every morning.  . lidocaine (LIDODERM) 5 % Place 1 patch onto the skin daily as needed. (12 hours on and 12 hours off)  . montelukast (SINGULAIR) 10 MG tablet Take 1 tablet by mouth daily.  . Multiple Vitamin  (MULTI-VITAMINS) TABS Take 1 tablet by mouth daily.  . naloxone (NARCAN) nasal spray 4 mg/0.1 mL Place 1 spray into the nose once. (for suspected overdose)  . olopatadine (PATANOL) 0.1 % ophthalmic solution Use 1 drop in each eye twice daily as needed  . omeprazole (PRILOSEC) 40 MG capsule TAKE 1 CAPSULE BY MOUTH TWICE A DAY  . oxyCODONE-acetaminophen (PERCOCET) 10-325 MG tablet TAKE 1 TAB EVERY 6 HOURS FOR 5 DAYS THEN 1 TAB TWICE A DAY AS NEEDED FOR SEVERE BREAKTHROUGH  PAIN  . Tiotropium Bromide Monohydrate (SPIRIVA RESPIMAT) 2.5 MCG/ACT AERS Inhale into the lungs.  Marland Kitchen tiZANidine (ZANAFLEX) 4 MG tablet Take 1 tablet by mouth 2 (two) times daily.  . traZODone (DESYREL) 100 MG tablet Take 1-2 tablets at bedtime  . Vitamin D, Ergocalciferol, (DRISDOL) 50000 units CAPS capsule Take 1 capsule by mouth once a week.   No facility-administered encounter medications on file as of 06/30/2019.    Activities of Daily Living In your present state of health, do you have any difficulty performing the following activities: 06/30/2019 06/29/2019  Hearing? N N  Vision? N N  Difficulty concentrating or making decisions? N N  Walking or climbing stairs? N N  Dressing or bathing? N N  Doing errands, shopping? N N  Preparing Food and eating ? N -  Using the Toilet? N -  In the past six months, have you accidently leaked urine? N -  Do you have problems with loss of bowel control? N -  Managing your Medications? N -  Managing your Finances? N -  Housekeeping or managing your Housekeeping? N -  Some recent data might be hidden    Patient Care Team: [name], MD as PCP - General (Family Medicine) Revankar, Reita Cliche, MD as PCP - Cardiology (Cardiology) Jackquline Denmark, MD as PCP - Gastroenterology (Gastroenterology)    Assessment:   This is a routine wellness examination for Hermann.  Exercise Activities and Dietary recommendations Current Exercise Habits: The patient does not participate in regular exercise  at present, Exercise limited by: orthopedic condition(s)  Goals   None     Fall Risk Fall Risk  06/30/2019  Falls in the past year? 1  Number falls in past yr: 0  Injury with Fall? 0  Follow up Falls prevention discussed   Is the patient's home free of loose throw rugs in walkways, pet beds, electrical cords, etc?   yes      Grab bars in the bathroom? no      Handrails on the stairs?   yes      Adequate lighting?   yes   Depression Screen PHQ 2/9 Scores 06/30/2019 06/30/2019 04/08/2018 03/10/2018  PHQ - 2 Score 1 1 2 2   PHQ- 9 Score - - 8 18     Cognitive Function        Immunization History  Administered Date(s) Administered  . Influenza-Unspecified 01/26/2018, 02/06/2019  . Pneumococcal Conjugate-13 12/16/2014  . Pneumococcal Polysaccharide-23 03/26/2013  . Td 03/13/2015      Screening Tests Health Maintenance  Topic Date Due  . Hepatitis C Screening  Jan 31, 1956  . HIV Screening  05/04/1969  . PAP SMEAR-Modifier  05/05/1975  . MAMMOGRAM  03/08/2019  . PNA vac Low Risk Adult (2 of 2 - PPSV23) 05/05/2019  . TETANUS/TDAP  03/12/2025  . COLONOSCOPY  06/03/2029  . INFLUENZA VACCINE  Completed  . DEXA SCAN  Completed    Cancer Screenings: Lung: Low Dose CT Chest recommended if Age 28-80 years, 30 pack-year currently smoking OR have quit w/in 15years. Patient does not qualify. Breast:  Up to date on Mammogram? No   Up to date of Bone Density/Dexa? No Colorectal: Colonoscopy done 06/04/19 - Normal        Plan:    I have personally reviewed and noted the following in the patient's chart:   . Medical and social history . Use of alcohol, tobacco or illicit drugs  . Current medications and supplements . Functional ability and status .  Nutritional status . Physical activity . Advanced directives . List of other physicians . Hospitalizations, surgeries, and ER visits in previous 12 months . Vitals . Screenings to include cognitive, depression, and  falls . Referrals and appointments  In addition, I have reviewed and discussed with patient certain preventive protocols, quality metrics, and best practice recommendations. A written personalized care plan for preventive services as well as general preventive health recommendations were provided to patient.     Erie Noe, LPN  [id number]

## 2019-06-30 NOTE — Patient Instructions (Addendum)
Health Maintenance After Age 65 After age 65, you are at a higher risk for certain [place]-term diseases and infections as well as injuries from falls. Falls are a major cause of broken bones and head injuries in people who are older than age 65. Getting regular preventive care can help to keep you healthy and well. Preventive care includes getting regular testing and making lifestyle changes as recommended by your health care provider. Talk with your health care provider about:  Which screenings and tests you should have. A screening is a test that checks for a disease when you have no symptoms.  A diet and exercise plan that is right for you. What should I know about screenings and tests to prevent falls? Screening and testing are the best ways to find a health problem early. Early diagnosis and treatment give you the best chance of managing medical conditions that are common after age 65. Certain conditions and lifestyle choices may make you more likely to have a fall. Your health care provider may recommend:  Regular vision checks. Poor vision and conditions such as cataracts can make you more likely to have a fall. If you wear glasses, make sure to get your prescription updated if your vision changes.  Medicine review. Work with your health care provider to regularly review all of the medicines you are taking, including over-the-counter medicines. Ask your health care provider about any side effects that may make you more likely to have a fall. Tell your health care provider if any medicines that you take make you feel dizzy or sleepy.  Osteoporosis screening. Osteoporosis is a condition that causes the bones to get weaker. This can make the bones weak and cause them to break more easily.  Blood pressure screening. Blood pressure changes and medicines to control blood pressure can make you feel dizzy.  Strength and balance checks. Your health care provider may recommend certain tests to check your  strength and balance while standing, walking, or changing positions.  Foot health exam. Foot pain and numbness, as well as not wearing proper footwear, can make you more likely to have a fall.  Depression screening. You may be more likely to have a fall if you have a fear of falling, feel emotionally low, or feel unable to do activities that you used to do.  Alcohol use screening. Using too much alcohol can affect your balance and may make you more likely to have a fall. What actions can I take to lower my risk of falls? General instructions  Talk with your health care provider about your risks for falling. Tell your health care provider if: ? You fall. Be sure to tell your health care provider about all falls, even ones that seem minor. ? You feel dizzy, sleepy, or off-balance.  Take over-the-counter and prescription medicines only as told by your health care provider. These include any supplements.  Eat a healthy diet and maintain a healthy weight. A healthy diet includes low-fat dairy products, low-fat (lean) meats, and fiber from whole grains, beans, and lots of fruits and vegetables. Home safety  Remove any tripping hazards, such as rugs, cords, and clutter.  Install safety equipment such as grab bars in bathrooms and safety rails on stairs.  Keep rooms and walkways well-lit. Activity   Follow a regular exercise program to stay fit. This will help you maintain your balance. Ask your health care provider what types of exercise are appropriate for you.  If you need a cane or   walker, use it as recommended by your health care provider.  Wear supportive shoes that have nonskid soles. Lifestyle  Do not drink alcohol if your health care provider tells you not to drink.  If you drink alcohol, limit how much you have: ? 0-1 drink a day for women. ? 0-2 drinks a day for men.  Be aware of how much alcohol is in your drink. In the [place] one drink equals one typical bottle of beer (12  oz), one-half glass of wine (5 oz), or one shot of hard liquor (1 oz).  Do not use any products that contain nicotine or tobacco, such as cigarettes and e-cigarettes. If you need help quitting, ask your health care provider. Summary  Having a healthy lifestyle and getting preventive care can help to protect your health and wellness after age 82.  Screening and testing are the best way to find a health problem early and help you avoid having a fall. Early diagnosis and treatment give you the best chance for managing medical conditions that are more common for people who are older than age 50.  Falls are a major cause of broken bones and head injuries in people who are older than age 59. Take precautions to prevent a fall at home.  Work with your health care provider to learn what changes you can make to improve your health and wellness and to prevent falls. This information is not intended to replace advice given to you by your health care provider. Make sure you discuss any questions you have with your health care provider. Document Revised: 09/04/2018 Document Reviewed: 03/27/2017 Elsevier Patient Education  2020 [place] you for enrolling in [facility]. Please follow the instructions below to securely access your online medical record. MyChart allows you to send messages to your doctor, view your test results, manage appointments, and more.   How Do I Sign Up? 1. In your Internet browser, go to [employer] and enter [email or web address]. 2. Click on the Sign Up Now link in the Sign In box. You will see the New Member Sign Up page. 3. Enter your MyChart Access Code exactly as it appears below. You will not need to use this code after you've completed the sign-up process. If you do not sign up before the expiration date, you must request a new code.  MyChart Access Code: [email or web address] Expires: 08/14/2019 12:01 PM  4. Enter your Social Security Number  ([id number]) and Date of Birth (mm/dd/yyyy) as indicated and click Submit. You will be taken to the next sign-up page. 5. Create a MyChart ID. This will be your MyChart login ID and cannot be changed, so think of one that is secure and easy to remember. 6. Create a MyChart password. You can change your password at any time. 7. Enter your Password Reset Question and Answer. This can be used at a later time if you forget your password.  8. Enter your e-mail address. You will receive e-mail notification when new information is available in [place]. 9. Click Sign Up. You can now view your medical record.   Additional Information Remember, MyChart is NOT to be used for urgent needs. For medical emergencies, dial 911.

## 2019-07-01 ENCOUNTER — Other Ambulatory Visit: Payer: Self-pay

## 2019-07-01 DIAGNOSIS — Z0000 Encounter for general adult medical examination without abnormal findings: Secondary | ICD-10-CM

## 2019-07-13 ENCOUNTER — Telehealth: Payer: Self-pay

## 2019-07-13 NOTE — Telephone Encounter (Signed)
Contacted patient to come in and sign and pick up the completed MOST form.  No mailbox available.

## 2019-07-16 ENCOUNTER — Other Ambulatory Visit: Payer: Self-pay

## 2019-07-16 ENCOUNTER — Ambulatory Visit (INDEPENDENT_AMBULATORY_CARE_PROVIDER_SITE_OTHER): Payer: Medicare Other | Admitting: Family Medicine

## 2019-07-16 ENCOUNTER — Encounter: Payer: Self-pay | Admitting: Family Medicine

## 2019-07-16 VITALS — BP 128/76 | HR 85 | Temp 96.7°F | Ht 64.0 in | Wt 136.0 lb

## 2019-07-16 DIAGNOSIS — M5386 Other specified dorsopathies, lumbar region: Secondary | ICD-10-CM | POA: Diagnosis not present

## 2019-07-16 DIAGNOSIS — G5602 Carpal tunnel syndrome, left upper limb: Secondary | ICD-10-CM | POA: Diagnosis not present

## 2019-07-16 DIAGNOSIS — H6502 Acute serous otitis media, left ear: Secondary | ICD-10-CM

## 2019-07-16 MED ORDER — CEFDINIR 300 MG PO CAPS: 300.0000 mg | ORAL_CAPSULE | Freq: Two times a day (BID) | ORAL | 0 refills | Status: DC

## 2019-07-16 MED ORDER — TRIAMCINOLONE ACETONIDE 40 MG/ML IJ SUSP
40.0000 mg | Freq: Once | INTRAMUSCULAR | Status: AC
  Administered 2019-07-16: 17:00:00 40 mg via INTRAMUSCULAR

## 2019-07-16 NOTE — Progress Notes (Signed)
Acute Office Visit  Subjective:    Patient ID: Connie Coffey, female    DOB: 03-Feb-1956, 64 y.o.   MRN: [id number]  Chief Complaint  Patient presents with  . Knee Pain    Right pain radiates from behind knee up to buttocks  . Hand Pain    Left swelling   HPI:  Patient is in today for right lower leg posterior to knee and radiates up to buttock/lower back. Rest helps. Heating pad helps. Unable to lay on right side. Unable to take nsaids due to gastic ulcer/perforation. Percocet helps, but is trying to not take more than 3 times a day. Also complaining of left hand pain. Was swelling more, but has improved. Pulling weeds. Patient has intermittent numbness and tingling of right foot and leg. Has had ESIs previously, but they are reluctant to repeat while she is overdoing it at home, while moving.   Past Medical History:  Diagnosis Date  . Adrenal insufficiency ([place])   . Anxiety   . Arthritis   . Carpal tunnel syndrome    Both hands, worse in right  . Chronic constipation   . COPD (chronic obstructive pulmonary disease) ([place])   . CVA (cerebral vascular accident) ([place])   . Facet syndrome   . Fibromyalgia   . GERD (gastroesophageal reflux disease)   . Heart murmur   . Hypercholesteremia   . IBS (irritable bowel syndrome)   . Ischemic stroke ([place])    07/19/2004  . Lumbar radiculopathy   . Primary hyperparathyroidism ([place])   . RLS (restless legs syndrome)   . Spinal stenosis   . TMJ (temporomandibular joint disorder)   . Trochanteric bursitis of both hips   . Vascular malformation    spinal cord    Past Surgical History:  Procedure Laterality Date  . APPENDECTOMY    . CARPAL TUNNEL RELEASE Right 12/13/2017   surgery  . CHOLECYSTECTOMY    . COLONOSCOPY  05/06/2017   Colonic polyps status post polypectomy. Interan land external hemorrhoids.   . ESOPHAGOGASTRODUODENOSCOPY  04/28/2007   Mild gastrtiis. Otherwise normal EGD.   Marland Kitchen HEMORROIDECTOMY    . LAMINECTOMY AND  MICRODISCECTOMY LUMBAR SPINE  01/03/2005  . NECK SURGERY     x2   . OTHER SURGICAL HISTORY  04/24/2004   anterior cervical discectomy and fusion at C5-C6  . TONSILLECTOMY    . TOTAL ABDOMINAL HYSTERECTOMY      Family History  Problem Relation Age of Onset  . Supraventricular tachycardia Mother   . Dementia Mother   . Hyperlipidemia Mother   . Hypertension Mother   . Osteoarthritis Mother   . Osteoporosis Mother   . Breast cancer Other   . Breast cancer Other   . Colon cancer Neg Hx   . Esophageal cancer Neg Hx     Social History   Socioeconomic History  . Marital status: Divorced    Spouse name: Not on file  . Number of children: 1  . Years of education: Not on file  . Highest education level: Not on file  Occupational History  . Occupation: Disabled  Tobacco Use  . Smoking status: Current Every Day Smoker    Packs/day: 1.50    Types: Cigarettes  . Smokeless tobacco: Never Used  Substance and Sexual Activity  . Alcohol use: Not Currently    Comment: quit 19 years  . Drug use: Never  . Sexual activity: Not on file  Other Topics Concern  . Not on file  Social History Narrative  . Not on file   Social Determinants of Health   Financial Resource Strain:   . Difficulty of Paying Living Expenses: Not on file  Food Insecurity: No Food Insecurity  . Worried About Charity fundraiser in the Last Year: Never true  . Ran Out of Food in the Last Year: Never true  Transportation Needs: No Transportation Needs  . Lack of Transportation (Medical): No  . Lack of Transportation (Non-Medical): No  Physical Activity:   . Days of Exercise per Week: Not on file  . Minutes of Exercise per Session: Not on file  Stress: Stress Concern Present  . Feeling of Stress : Very much  Social Connections:   . Frequency of Communication with Friends and Family: Not on file  . Frequency of Social Gatherings with Friends and Family: Not on file  . Attends Religious Services: Not on file    . Active Member of Clubs or Organizations: Not on file  . Attends Archivist Meetings: Not on file  . Marital Status: Not on file  Intimate Partner Violence:   . Fear of Current or Ex-Partner: Not on file  . Emotionally Abused: Not on file  . Physically Abused: Not on file  . Sexually Abused: Not on file    Outpatient Medications Prior to Visit  Medication Sig Dispense Refill  . albuterol (PROVENTIL HFA;VENTOLIN HFA) 108 (90 Base) MCG/ACT inhaler INHALE 1 PUFF EVERY 4 HOURS AS NEEDED.    Marland Kitchen alendronate (FOSAMAX) 70 MG tablet Take 1 tablet by mouth once a week.    Marland Kitchen atorvastatin (LIPITOR) 40 MG tablet Take 1 tablet by mouth daily.    . clonazePAM (KLONOPIN) 0.5 MG tablet TAKE 1 TABLET BY MOUTH TWICE A DAY AS NEEDED FOR ANXIETY  2  . DULoxetine (CYMBALTA) 60 MG capsule Take 1 capsule by mouth 2 (two) times daily.    . hydrocortisone (CORTEF) 20 MG tablet Take 1 tablet in the morning and 1/2 tablet at 4pm    . levothyroxine (SYNTHROID, LEVOTHROID) 50 MCG tablet Take 1 tablet by mouth every morning.    . lidocaine (LIDODERM) 5 % Place 1 patch onto the skin daily as needed. (12 hours on and 12 hours off)    . montelukast (SINGULAIR) 10 MG tablet Take 1 tablet by mouth daily.    Marland Kitchen moxifloxacin (VIGAMOX) 0.5 % ophthalmic solution 1 drop 3 (three) times daily.    . Multiple Vitamin (MULTI-VITAMINS) TABS Take 1 tablet by mouth daily.    Marland Kitchen olopatadine (PATANOL) 0.1 % ophthalmic solution Use 1 drop in each eye twice daily as needed 5 mL 5  . omeprazole (PRILOSEC) 40 MG capsule TAKE 1 CAPSULE BY MOUTH TWICE A DAY 180 capsule 1  . oxyCODONE-acetaminophen (PERCOCET) 10-325 MG tablet TAKE 1 TAB EVERY 6 HOURS FOR 5 DAYS THEN 1 TAB TWICE A DAY AS NEEDED FOR SEVERE BREAKTHROUGH PAIN  0  . Tiotropium Bromide Monohydrate (SPIRIVA RESPIMAT) 2.5 MCG/ACT AERS Inhale into the lungs.    Marland Kitchen tiZANidine (ZANAFLEX) 4 MG tablet Take 1 tablet by mouth 2 (two) times daily.    . traZODone (DESYREL) 100 MG  tablet Take 1-2 tablets at bedtime    . Vitamin D, Ergocalciferol, (DRISDOL) 50000 units CAPS capsule Take 1 capsule by mouth once a week.    . busPIRone (BUSPAR) 30 MG tablet Take 1 tablet by mouth 2 (two) times daily.    . cyclobenzaprine (FLEXERIL) 10 MG tablet Take 10 mg by  mouth 3 (three) times daily.    . diclofenac sodium (VOLTAREN) 1 % GEL Apply 1 application topically daily as needed.    Marland Kitchen DYMISTA 137-50 MCG/ACT SUSP Place 1 spray into the nose daily.    . fenofibrate 160 MG tablet Take 160 mg by mouth daily.    Marland Kitchen gabapentin (NEURONTIN) 600 MG tablet Take 1 tablet by mouth 4 (four) times daily.    Marland Kitchen lubiprostone (AMITIZA) 8 MCG capsule TAKE 1 CAPSULE (8 MCG) BY MOUTH 2 TIMES PER DAY WITH FOOD AND WATER    . naloxone (NARCAN) nasal spray 4 mg/0.1 mL Place 1 spray into the nose once. (for suspected overdose)     No facility-administered medications prior to visit.    Allergies  Allergen Reactions  . Celebrex [Celecoxib]     "because of stomach ulcers"  . Chantix [Varenicline]   . Fish Oil Other (See Comments)    hemorrhoids  . Nsaids Other (See Comments)    GI Upset  . Other     beets  . Nicotrol [Nicotine] Nausea Only  . Penicillins Rash    Review of Systems  Constitutional: Negative for chills, fatigue and fever.  HENT: Negative for congestion, ear pain and sore throat.   Respiratory: Positive for cough and shortness of breath.        Baseline.   Cardiovascular: Negative for chest pain.       Objective:    Physical Exam Vitals reviewed.  Constitutional:      General: She is not in acute distress.    Appearance: Normal appearance. She is obese.  HENT:     Right Ear: Tympanic membrane and ear canal normal.     Left Ear: Ear canal normal.     Ears:     Comments: Left TM serous effusion. No movement with manometry.    Nose: Nose normal. No congestion or rhinorrhea.  Eyes:     Conjunctiva/sclera: Conjunctivae normal.  Neck:     Thyroid: No thyroid mass.   Cardiovascular:     Rate and Rhythm: Normal rate and regular rhythm.     Pulses: Normal pulses.     Heart sounds: No murmur.  Pulmonary:     Effort: Pulmonary effort is normal.     Breath sounds: Normal breath sounds.  Abdominal:     General: Bowel sounds are normal.     Palpations: Abdomen is soft. There is no mass.     Tenderness: There is no abdominal tenderness.  Musculoskeletal:        General: Tenderness present. No deformity. Normal range of motion.     Comments: Right buttock tender. Right lumbar region tender. Negative SLR. DTR 2+ patellar BL, Left ankle. 0 on right ankle.  nontender knees BL.  FROM of hips and knees.   Lymphadenopathy:     Cervical: No cervical adenopathy.  Skin:    General: Skin is warm and dry.  Neurological:     Mental Status: She is alert and oriented to person, place, and time.     Comments: Tinels positive BL. Phalens Positive BL. Left > Right. Edema in interosseous muscles of left hand.   Psychiatric:        Mood and Affect: Mood normal.        Behavior: Behavior normal.     BP 128/76 (BP Location: Right Arm, Patient Position: Sitting)   Pulse 85   Temp (!) 96.7 F (35.9 C) (Temporal)   Ht '5\' 4"'  (1.626 m)  Wt 136 lb (61.7 kg)   SpO2 94%   BMI 23.34 kg/m  Wt Readings from Last 3 Encounters:  07/16/19 136 lb (61.7 kg)  06/30/19 131 lb (59.4 kg)  09/01/18 134 lb 6.4 oz (61 kg)    Health Maintenance Due  Topic Date Due  . Hepatitis C Screening  1956-04-02  . HIV Screening  05/04/1969  . PAP SMEAR-Modifier  05/05/1975  . MAMMOGRAM  03/08/2019  . PNA vac Low Risk Adult (2 of 2 - PPSV23) 05/05/2019    There are no preventive care reminders to display for this patient.   No results found for: TSH No results found for: WBC, HGB, HCT, MCV, PLT No results found for: NA, K, CHLORIDE, CO2, GLUCOSE, BUN, CREATININE, BILITOT, ALKPHOS, AST, ALT, PROT, ALBUMIN, CALCIUM, ANIONGAP, EGFR, GFR No results found for: CHOL No results found for:  HDL No results found for: LDLCALC No results found for: TRIG No results found for: CHOLHDL No results found for: HGBA1C     Assessment & Plan:   Problem List Items Addressed This Visit    None    Visit Diagnoses    Sciatica of right side associated with disorder of lumbar spine    -  Primary   Relevant Medications   cyclobenzaprine (FLEXERIL) 10 MG tablet   Left carpal tunnel syndrome       Relevant Medications   cyclobenzaprine (FLEXERIL) 10 MG tablet       No orders of the defined types were placed in this encounter.    [name], MD

## 2019-07-16 NOTE — Patient Instructions (Signed)
Triamcinolone injection What is this medicine? TRIAMCINOLONE (trye am SIN oh lone) is a corticosteroid. It helps to reduce swelling, redness, itching, and allergic reactions. This medicine is used to treat allergies, arthritis, asthma, skin problems, and many other conditions. This medicine may be used for other purposes; ask your health care provider or pharmacist if you have questions. COMMON BRAND NAME(S): Aristocort, Aristocort Forte, Aristospan, Arze-Ject-A, Kenalog, Tac-3, Triamonide, Triesence What should I tell my health care provider before I take this medicine? They need to know if you have any of these conditions:  Cushing's syndrome  diabetes  glaucoma  heart disease  high blood pressure  infection, like tuberculosis, herpes, measles, chickenpox, or fungal infection  liver disease  low levels of potassium in the blood  mental illness  myasthenia gravis  osteoporosis  recent heart attack  seizures  stomach or intestine disease  thyroid disease  an unusual or allergic reaction to triamcinolone, corticosteroids, benzyl alcohol, other medicines, foods, dyes, or preservatives  pregnant or trying to get pregnant  breast-feeding How should I use this medicine? This medicine is injected by a health care professional. After your dose follow your doctor's instructions for your care. Contact your pediatrician regarding the use of this medicine in children. Special care may be needed. Overdosage: If you think you have taken too much of this medicine contact a poison control center or emergency room at once. NOTE: This medicine is only for you. Do not share this medicine with others. What if I miss a dose? This does not apply. What may interact with this medicine?  antiviral medicines for HIV or AIDS  aspirin  certain medicines for fungal infections like ketoconazole and itraconazole  clarithromycin  mifepristone  nefazodone  other steroid  medicines  vaccines and other immunization products This list may not describe all possible interactions. Give your health care provider a list of all the medicines, herbs, non-prescription drugs, or dietary supplements you use. Also tell them if you smoke, drink alcohol, or use illegal drugs. Some items may interact with your medicine. What should I watch for while using this medicine? Visit your doctor or health care professional for regular checks on your progress. If you are taking this medicine for a [place] time, carry an identification card with your name, the type and dose of medicine, and your doctor's name and address. Do not come in contact with people who have chickenpox or the measles while you are taking this medicine. If you do, call your doctor right away. This medicine may increase blood sugar. Ask your healthcare provider if changes in diet or medicines are needed if you have diabetes. What side effects may I notice from receiving this medicine? Side effects that you should report to your doctor or health care professional as soon as possible:  allergic reactions like skin rash, itching or hives, swelling of the face, lips, or tongue  black, tarry stools  changes in emotions or moods  eye pain  increased blood pressure  increased joint pain and swelling at site where injected  lumpy, thin skin at site where injected  rounding of face  seizures  signs and symptoms of high blood sugar such as being more thirsty or hungry or having to urinate more than normal. You may also feel very tired or have blurry vision.  signs and symptoms of infection like fever or chills; cough; sore throat; pain or trouble passing urine  slow growth in children (if used for longer periods of time)  sores that do not heal  stomach pain  swelling of ankles, feet, hands  trouble sleeping  unusual bleeding or bruising  unusual increased growth of hair on the face or body Side effects  that usually do not require medical attention (report to your doctor or health care professional if they continue or are bothersome):  headache  nausea  pain, redness, or irritation at site where injected  upset stomach  weight gain This list may not describe all possible side effects. Call your doctor for medical advice about side effects. You may report side effects to FDA at 1-800-FDA-1088. Where should I keep my medicine? Keep out of the reach of children. Store at room temperature between 20 and 25 degrees C (68 and 77 degrees F). Do not freeze. Protect from temperatures below 20 degrees C (68 degrees F). Protect from light. Keep in the original container. Store vial upright. Throw away any unused medicine after the expiration date. NOTE: This sheet is a summary. It may not cover all possible information. If you have questions about this medicine, talk to your doctor, pharmacist, or health care provider.  2020 Elsevier/Gold Standard (2018-02-13 10:55:56) Sciatica  Sciatica is pain, weakness, tingling, or loss of feeling (numbness) along the sciatic nerve. The sciatic nerve starts in the lower back and goes down the back of each leg. Sciatica usually goes away on its own or with treatment. Sometimes, sciatica may come back (recur). What are the causes? This condition happens when the sciatic nerve is pinched or has pressure put on it. This may be the result of:  A disk in between the bones of the spine bulging out too far (herniated disk).  Changes in the spinal disks that occur with aging.  A condition that affects a muscle in the butt.  Extra bone growth near the sciatic nerve.  A break (fracture) of the area between your hip bones (pelvis).  Pregnancy.  Tumor. This is rare. What increases the risk? You are more likely to develop this condition if you:  Play sports that put pressure or stress on the spine.  Have poor strength and ease of movement (flexibility).  Have  had a back injury in the past.  Have had back surgery.  Sit for Fischel periods of time.  Do activities that involve bending or lifting over and over again.  Are very overweight (obese). What are the signs or symptoms? Symptoms can vary from mild to very bad. They may include:  Any of these problems in the lower back, leg, hip, or butt: ? Mild tingling, loss of feeling, or dull aches. ? Burning sensations. ? Sharp pains.  Loss of feeling in the back of the calf or the sole of the foot.  Leg weakness.  Very bad back pain that makes it hard to move. These symptoms may get worse when you cough, sneeze, or laugh. They may also get worse when you sit or stand for [place] periods of time. How is this treated? This condition often gets better without any treatment. However, treatment may include:  Changing or cutting back on physical activity when you have pain.  Doing exercises and stretching.  Putting ice or heat on the affected area.  Medicines that help: ? To relieve pain and swelling. ? To relax your muscles.  Shots (injections) of medicines that help to relieve pain, irritation, and swelling.  Surgery. Follow these instructions at home: Medicines  Take over-the-counter and prescription medicines only as told by your doctor.  Ask your doctor if the medicine prescribed to you: ? Requires you to avoid driving or using heavy machinery. ? Can cause trouble pooping (constipation). You may need to take these steps to prevent or treat trouble pooping:  Drink enough fluids to keep your pee (urine) pale yellow.  Take over-the-counter or prescription medicines.  Eat foods that are high in fiber. These include beans, whole grains, and fresh fruits and vegetables.  Limit foods that are high in fat and sugar. These include fried or sweet foods. Managing pain      If told, put ice on the affected area. ? Put ice in a plastic bag. ? Place a towel between your skin and the  bag. ? Leave the ice on for 20 minutes, 2-3 times a day.  If told, put heat on the affected area. Use the heat source that your doctor tells you to use, such as a moist heat pack or a heating pad. ? Place a towel between your skin and the heat source. ? Leave the heat on for 20-30 minutes. ? Remove the heat if your skin turns bright red. This is very important if you are unable to feel pain, heat, or cold. You may have a greater risk of getting burned. Activity   Return to your normal activities as told by your doctor. Ask your doctor what activities are safe for you.  Avoid activities that make your symptoms worse.  Take short rests during the day. ? When you rest for a [place] time, do some physical activity or stretching between periods of rest. ? Avoid sitting for a [place] time without moving. Get up and move around at least one time each hour.  Exercise and stretch regularly, as told by your doctor.  Do not lift anything that is heavier than 10 lb (4.5 kg) while you have symptoms of sciatica. ? Avoid lifting heavy things even when you do not have symptoms. ? Avoid lifting heavy things over and over.  When you lift objects, always lift in a way that is safe for your body. To do this, you should: ? Bend your knees. ? Keep the object close to your body. ? Avoid twisting. General instructions  Stay at a healthy weight.  Wear comfortable shoes that support your feet. Avoid wearing high heels.  Avoid sleeping on a mattress that is too soft or too hard. You might have less pain if you sleep on a mattress that is firm enough to support your back.  Keep all follow-up visits as told by your doctor. This is important. Contact a doctor if:  You have pain that: ? Wakes you up when you are sleeping. ? Gets worse when you lie down. ? Is worse than the pain you have had in the past. ? Lasts longer than 4 weeks.  You lose weight without trying. Get help right away if:  You cannot  control when you pee (urinate) or poop (have a bowel movement).  You have weakness in any of these areas and it gets worse: ? Lower back. ? The area between your hip bones. ? Butt. ? Legs.  You have redness or swelling of your back.  You have a burning feeling when you pee. Summary  Sciatica is pain, weakness, tingling, or loss of feeling (numbness) along the sciatic nerve.  This condition happens when the sciatic nerve is pinched or has pressure put on it.  Sciatica can cause pain, tingling, or loss of feeling (numbness) in the lower  back, legs, hips, and butt.  Treatment often includes rest, exercise, medicines, and putting ice or heat on the affected area. This information is not intended to replace advice given to you by your health care provider. Make sure you discuss any questions you have with your health care provider. Document Revised: 06/02/2018 Document Reviewed: 06/02/2018 Elsevier Patient Education  [place].

## 2019-07-18 ENCOUNTER — Encounter: Payer: Self-pay | Admitting: Family Medicine

## 2019-07-18 DIAGNOSIS — H6502 Acute serous otitis media, left ear: Secondary | ICD-10-CM | POA: Insufficient documentation

## 2019-07-18 DIAGNOSIS — M5386 Other specified dorsopathies, lumbar region: Secondary | ICD-10-CM | POA: Insufficient documentation

## 2019-07-18 NOTE — Assessment & Plan Note (Signed)
I recommended to use a carpal tunnel brace.  Continue current pain medicines.  NSAIDs contraindicated.  Kenalog shot given.

## 2019-07-18 NOTE — Assessment & Plan Note (Signed)
Cefdinir prescribed.

## 2019-07-18 NOTE — Assessment & Plan Note (Signed)
Follow-up with pain management.  Consider ESI in the future. NSAIDs contraindicated.  Patient already has pain medicines and muscle relaxant.  Recommend ice and stretching. Kenalog shot given today.

## 2019-07-23 ENCOUNTER — Other Ambulatory Visit: Payer: Self-pay

## 2019-07-23 MED ORDER — CLONAZEPAM 0.5 MG PO TABS: 0.5000 mg | ORAL_TABLET | Freq: Every day | ORAL | 0 refills | Status: DC

## 2019-07-24 ENCOUNTER — Other Ambulatory Visit: Payer: Self-pay

## 2019-07-24 MED ORDER — CLONAZEPAM 0.5 MG PO TABS: 0.5000 mg | ORAL_TABLET | Freq: Every day | ORAL | 0 refills | Status: DC

## 2019-07-30 ENCOUNTER — Other Ambulatory Visit: Payer: Self-pay

## 2019-07-30 MED ORDER — DYMISTA 137-50 MCG/ACT NA SUSP: 1.0000 | Freq: Every day | NASAL | 1 refills | Status: DC

## 2019-07-30 MED ORDER — TRAZODONE HCL 100 MG PO TABS: 100.0000 mg | ORAL_TABLET | Freq: Every evening | ORAL | 0 refills | Status: DC | PRN

## 2019-08-05 ENCOUNTER — Other Ambulatory Visit: Payer: Self-pay

## 2019-08-05 MED ORDER — CLONAZEPAM 0.5 MG PO TABS: 0.5000 mg | ORAL_TABLET | Freq: Every day | ORAL | 0 refills | Status: DC

## 2019-08-09 ENCOUNTER — Other Ambulatory Visit: Payer: Self-pay | Admitting: Family Medicine

## 2019-08-17 ENCOUNTER — Other Ambulatory Visit: Payer: Self-pay

## 2019-08-17 MED ORDER — CLONAZEPAM 0.5 MG PO TABS: 0.5000 mg | ORAL_TABLET | Freq: Two times a day (BID) | ORAL | 0 refills | Status: DC

## 2019-08-17 NOTE — Telephone Encounter (Signed)
Connie Coffey called to report that she received 7 tablets of the clonazepam and she needs a refill sent to the pharmacy.

## 2019-08-25 DIAGNOSIS — M5412 Radiculopathy, cervical region: Secondary | ICD-10-CM | POA: Diagnosis not present

## 2019-08-25 DIAGNOSIS — M5134 Other intervertebral disc degeneration, thoracic region: Secondary | ICD-10-CM | POA: Diagnosis not present

## 2019-08-25 DIAGNOSIS — M961 Postlaminectomy syndrome, not elsewhere classified: Secondary | ICD-10-CM | POA: Diagnosis not present

## 2019-08-25 DIAGNOSIS — M5416 Radiculopathy, lumbar region: Secondary | ICD-10-CM | POA: Diagnosis not present

## 2019-08-25 DIAGNOSIS — I10 Essential (primary) hypertension: Secondary | ICD-10-CM | POA: Insufficient documentation

## 2019-08-31 ENCOUNTER — Other Ambulatory Visit: Payer: Self-pay | Admitting: Family Medicine

## 2019-09-07 DIAGNOSIS — M5416 Radiculopathy, lumbar region: Secondary | ICD-10-CM | POA: Diagnosis not present

## 2019-09-08 ENCOUNTER — Encounter: Payer: Self-pay | Admitting: Legal Medicine

## 2019-09-08 ENCOUNTER — Ambulatory Visit (INDEPENDENT_AMBULATORY_CARE_PROVIDER_SITE_OTHER): Payer: Medicare Other | Admitting: Legal Medicine

## 2019-09-08 ENCOUNTER — Other Ambulatory Visit: Payer: Self-pay

## 2019-09-08 VITALS — BP 140/80 | HR 92 | Temp 98.0°F | Ht 64.0 in | Wt 132.0 lb

## 2019-09-08 DIAGNOSIS — J441 Chronic obstructive pulmonary disease with (acute) exacerbation: Secondary | ICD-10-CM | POA: Diagnosis not present

## 2019-09-08 MED ORDER — AZITHROMYCIN 250 MG PO TABS: ORAL_TABLET | ORAL | 0 refills | Status: DC

## 2019-09-08 MED ORDER — TRIAMCINOLONE ACETONIDE 40 MG/ML IJ SUSP
80.0000 mg | Freq: Once | INTRAMUSCULAR | Status: AC
  Administered 2019-09-08: 80 mg via INTRAMUSCULAR

## 2019-09-08 NOTE — Progress Notes (Signed)
Acute Office Visit  Subjective:    Patient ID: Connie Coffey, female    DOB: [birth year], 64 y.o.   MRN: [id number]  Chief Complaint  Patient presents with  . Sinusitis  . Cough    HPI Patient is in today for sinus congestion and wheezing.  She is coughing yellow phlegm.  No fever or chills, she has COPD.  She was exposed to tree pollen last Friday.  Past Medical History:  Diagnosis Date  . Adrenal insufficiency ([place])   . Anxiety   . Arthritis   . Carpal tunnel syndrome    Both hands, worse in right  . Chronic constipation   . COPD (chronic obstructive pulmonary disease) ([place])   . CVA (cerebral vascular accident) ([place])   . Facet syndrome   . Fibromyalgia   . GERD (gastroesophageal reflux disease)   . Heart murmur   . Hypercholesteremia   . IBS (irritable bowel syndrome)   . Ischemic stroke ([place])    07/19/2004  . Lumbar radiculopathy   . Primary hyperparathyroidism ([place])   . RLS (restless legs syndrome)   . Spinal stenosis   . TMJ (temporomandibular joint disorder)   . Trochanteric bursitis of both hips   . Vascular malformation    spinal cord    Past Surgical History:  Procedure Laterality Date  . APPENDECTOMY    . CARPAL TUNNEL RELEASE Right 12/13/2017   surgery  . CHOLECYSTECTOMY    . COLONOSCOPY  05/06/2017   Colonic polyps status post polypectomy. Interan land external hemorrhoids.   . ESOPHAGOGASTRODUODENOSCOPY  04/28/2007   Mild gastrtiis. Otherwise normal EGD.   Marland Kitchen HEMORROIDECTOMY    . LAMINECTOMY AND MICRODISCECTOMY LUMBAR SPINE  01/03/2005  . NECK SURGERY     x2   . OTHER SURGICAL HISTORY  04/24/2004   anterior cervical discectomy and fusion at C5-C6  . TONSILLECTOMY    . TOTAL ABDOMINAL HYSTERECTOMY      Family History  Problem Relation Age of Onset  . Supraventricular tachycardia Mother   . Dementia Mother   . Hyperlipidemia Mother   . Hypertension Mother   . Osteoarthritis Mother   . Osteoporosis Mother   . Breast cancer Other   .  Breast cancer Other   . Colon cancer Neg Hx   . Esophageal cancer Neg Hx     Social History   Socioeconomic History  . Marital status: Divorced    Spouse name: Not on file  . Number of children: 1  . Years of education: Not on file  . Highest education level: Not on file  Occupational History  . Occupation: Disabled  Tobacco Use  . Smoking status: Current Every Day Smoker    Packs/day: 1.50    Types: Cigarettes  . Smokeless tobacco: Never Used  Substance and Sexual Activity  . Alcohol use: Not Currently    Comment: quit 19 years  . Drug use: Never  . Sexual activity: Not on file  Other Topics Concern  . Not on file  Social History Narrative  . Not on file   Social Determinants of Health   Financial Resource Strain:   . Difficulty of Paying Living Expenses:   Food Insecurity: No Food Insecurity  . Worried About Charity fundraiser in the Last Year: Never true  . Ran Out of Food in the Last Year: Never true  Transportation Needs: No Transportation Needs  . Lack of Transportation (Medical): No  . Lack of Transportation (Non-Medical): No  Physical  Activity:   . Days of Exercise per Week:   . Minutes of Exercise per Session:   Stress: Stress Concern Present  . Feeling of Stress : Very much  Social Connections:   . Frequency of Communication with Friends and Family:   . Frequency of Social Gatherings with Friends and Family:   . Attends Religious Services:   . Active Member of Clubs or Organizations:   . Attends Archivist Meetings:   Marland Kitchen Marital Status:   Intimate Partner Violence:   . Fear of Current or Ex-Partner:   . Emotionally Abused:   Marland Kitchen Physically Abused:   . Sexually Abused:     Outpatient Medications Prior to Visit  Medication Sig Dispense Refill  . albuterol (PROVENTIL HFA;VENTOLIN HFA) 108 (90 Base) MCG/ACT inhaler INHALE 1 PUFF EVERY 4 HOURS AS NEEDED.    Marland Kitchen alendronate (FOSAMAX) 70 MG tablet Take 1 tablet by mouth once a week.    Marland Kitchen  atorvastatin (LIPITOR) 40 MG tablet Take 1 tablet by mouth daily.    . busPIRone (BUSPAR) 30 MG tablet Take 1 tablet by mouth 2 (two) times daily.    . clonazePAM (KLONOPIN) 0.5 MG tablet Take 1 tablet (0.5 mg total) by mouth 2 (two) times daily. TAKE 1 TABLET BY MOUTH TWICE A DAY AS NEEDED FOR ANXIETY 60 tablet 0  . cyclobenzaprine (FLEXERIL) 10 MG tablet Take 10 mg by mouth 3 (three) times daily.    . diclofenac sodium (VOLTAREN) 1 % GEL Apply 1 application topically daily as needed.    . DULoxetine (CYMBALTA) 60 MG capsule Take 1 capsule by mouth 2 (two) times daily.    Marland Kitchen DYMISTA 137-50 MCG/ACT SUSP Place 1 spray into the nose daily. 23 g 1  . fenofibrate 160 MG tablet TAKE 1 TABLET BY MOUTH EVERY DAY 90 tablet 0  . hydrocortisone (CORTEF) 20 MG tablet Take 1 tablet in the morning and 1/2 tablet at 4pm    . levothyroxine (SYNTHROID, LEVOTHROID) 50 MCG tablet Take 1 tablet by mouth every morning.    . lidocaine (LIDODERM) 5 % Place 1 patch onto the skin daily as needed. (12 hours on and 12 hours off)    . Lubiprostone (AMITIZA PO) Take by mouth.    . montelukast (SINGULAIR) 10 MG tablet Take 1 tablet by mouth daily.    . Multiple Vitamin (MULTI-VITAMINS) TABS Take 1 tablet by mouth daily.    . naloxone (NARCAN) nasal spray 4 mg/0.1 mL Place 1 spray into the nose once. (for suspected overdose)    . olopatadine (PATANOL) 0.1 % ophthalmic solution Use 1 drop in each eye twice daily as needed 5 mL 5  . omeprazole (PRILOSEC) 40 MG capsule TAKE 1 CAPSULE BY MOUTH  TWICE DAILY 180 capsule 3  . oxyCODONE-acetaminophen (PERCOCET) 10-325 MG tablet TAKE 1 TAB EVERY 6 HOURS FOR 5 DAYS THEN 1 TAB TWICE A DAY AS NEEDED FOR SEVERE BREAKTHROUGH PAIN  0  . Tiotropium Bromide Monohydrate (SPIRIVA RESPIMAT) 2.5 MCG/ACT AERS Inhale into the lungs.    Marland Kitchen tiZANidine (ZANAFLEX) 4 MG tablet Take 1 tablet by mouth 2 (two) times daily.    . traZODone (DESYREL) 100 MG tablet Take 1 tablet (100 mg total) by mouth at  bedtime as needed for sleep (take 1 - 2 tablets at bedtime as needed). Take 1-2 tablets at bedtime 60 tablet 0  . Vitamin D, Ergocalciferol, (DRISDOL) 50000 units CAPS capsule Take 1 capsule by mouth once a week.    Marland Kitchen  gabapentin (NEURONTIN) 600 MG tablet Take 1 tablet by mouth 4 (four) times daily.    . cefdinir (OMNICEF) 300 MG capsule Take 1 capsule (300 mg total) by mouth 2 (two) times daily. 20 capsule 0  . lubiprostone (AMITIZA) 8 MCG capsule TAKE 1 CAPSULE (8 MCG) BY MOUTH 2 TIMES PER DAY WITH FOOD AND WATER    . moxifloxacin (VIGAMOX) 0.5 % ophthalmic solution 1 drop 3 (three) times daily.     No facility-administered medications prior to visit.    Allergies  Allergen Reactions  . Celebrex [Celecoxib]     "because of stomach ulcers"  . Chantix [Varenicline]   . Fish Oil Other (See Comments)    hemorrhoids  . Nsaids Other (See Comments)    GI Upset  . Other     beets  . Nicotrol [Nicotine] Nausea Only  . Penicillins Rash    Review of Systems  Constitutional: Negative.   HENT: Positive for sinus pain.   Eyes: Negative.   Respiratory: Positive for wheezing.   Cardiovascular: Negative.   Gastrointestinal: Negative.   Genitourinary: Negative.   Neurological: Negative.        Objective:    Physical Exam Vitals reviewed.  Constitutional:      Appearance: Normal appearance.  HENT:     Head: Normocephalic and atraumatic.     Right Ear: Tympanic membrane normal.     Left Ear: Tympanic membrane normal.     Nose: Nose normal.     Mouth/Throat:     Comments: Post nasal drip. Cardiovascular:     Rate and Rhythm: Normal rate and regular rhythm.     Pulses: Normal pulses.     Heart sounds: Normal heart sounds.  Pulmonary:     Breath sounds: Wheezing present.  Musculoskeletal:     Cervical back: Normal range of motion and neck supple.  Neurological:     Mental Status: She is alert.     BP 140/80   Pulse 92   Temp 98 F (36.7 C)   Ht '5\' 4"'  (1.626 m)   Wt 132  lb (59.9 kg)   SpO2 94%   BMI 22.66 kg/m  Wt Readings from Last 3 Encounters:  09/08/19 132 lb (59.9 kg)  07/16/19 136 lb (61.7 kg)  06/30/19 131 lb (59.4 kg)    Health Maintenance Due  Topic Date Due  . Hepatitis C Screening  Never done  . HIV Screening  Never done  . PAP SMEAR-Modifier  Never done  . MAMMOGRAM  03/08/2019  . PNA vac Low Risk Adult (2 of 2 - PPSV23) 05/05/2019    There are no preventive care reminders to display for this patient.   No results found for: TSH No results found for: WBC, HGB, HCT, MCV, PLT No results found for: NA, K, CHLORIDE, CO2, GLUCOSE, BUN, CREATININE, BILITOT, ALKPHOS, AST, ALT, PROT, ALBUMIN, CALCIUM, ANIONGAP, EGFR, GFR No results found for: CHOL No results found for: HDL No results found for: LDLCALC No results found for: TRIG No results found for: CHOLHDL No results found for: HGBA1C     Assessment & Plan:   Problem List Items Addressed This Visit      Respiratory   Obstructive chronic bronchitis with exacerbation ([place]) - Primary    An individualize plan was formulated for care of COPD.  Treatment is evidence based.  She will continue on inhalers, avoid smoking and smoke.  Regular exercise with help with dyspnea. Routine follow ups and medication compliance is needed.  Patient is having an exacerbation of COPD, given kenalog 1m injection and z-pack, she is to return if not improving and she is to use her MDIs.      Relevant Medications   azithromycin (ZITHROMAX) 250 MG tablet       Meds ordered this encounter  Medications  . azithromycin (ZITHROMAX) 250 MG tablet    Sig: 2 tablets on day 1, then 1 tablet daily on days 2-6.2 tablets on day 1, then 1 tablet daily on days 2-6.    Dispense:  6 tablet    Refill:  0  . triamcinolone acetonide (KENALOG-40) injection 80 mg   Return if symptoms worsen or fail to improve.   LReinaldo Meeker MD

## 2019-09-08 NOTE — Assessment & Plan Note (Signed)
An individualize plan was formulated for care of COPD.  Treatment is evidence based.  She will continue on inhalers, avoid smoking and smoke.  Regular exercise with help with dyspnea. Routine follow ups and medication compliance is needed. Patient is having an exacerbation of COPD, given kenalog 80mg  injection and z-pack, she is to return if not improving and she is to use her MDIs.

## 2019-09-09 ENCOUNTER — Telehealth: Payer: Self-pay | Admitting: Family Medicine

## 2019-09-09 NOTE — Progress Notes (Signed)
  Chronic Care Management   Outreach Note  09/09/2019 Name: Connie Coffey MRN: [id number] DOB: [birth year]  Referred by: [name], MD Reason for referral : No chief complaint on file.   An unsuccessful telephone outreach was attempted today. The patient was referred to the pharmacist for assistance with care management and care coordination.   Follow Up Plan:   Earney Hamburg Upstream Scheduler

## 2019-09-17 ENCOUNTER — Other Ambulatory Visit: Payer: Self-pay | Admitting: Family Medicine

## 2019-09-21 ENCOUNTER — Telehealth: Payer: Self-pay | Admitting: Family Medicine

## 2019-09-21 NOTE — Progress Notes (Signed)
  Chronic Care Management   Outreach Note  09/21/2019 Name: WILENE ORLIK MRN: [id number] DOB: 14-Oct-1955  Referred by: [name], MD Reason for referral : No chief complaint on file.   An unsuccessful telephone outreach was attempted today. The patient was referred to the pharmacist for assistance with care management and care coordination.    This note is not being shared with the patient for the following reason: To respect privacy (The patient or proxy has requested that the information not be shared).  Follow Up Plan:   Earney Hamburg Upstream Scheduler

## 2019-10-07 DIAGNOSIS — M5134 Other intervertebral disc degeneration, thoracic region: Secondary | ICD-10-CM | POA: Diagnosis not present

## 2019-10-07 DIAGNOSIS — M5416 Radiculopathy, lumbar region: Secondary | ICD-10-CM | POA: Diagnosis not present

## 2019-10-07 DIAGNOSIS — M5412 Radiculopathy, cervical region: Secondary | ICD-10-CM | POA: Diagnosis not present

## 2019-10-14 ENCOUNTER — Ambulatory Visit (INDEPENDENT_AMBULATORY_CARE_PROVIDER_SITE_OTHER): Payer: Medicare Other | Admitting: Family Medicine

## 2019-10-14 ENCOUNTER — Other Ambulatory Visit: Payer: Self-pay

## 2019-10-14 VITALS — BP 136/78 | HR 66 | Temp 98.7°F | Ht 64.0 in | Wt 128.0 lb

## 2019-10-14 DIAGNOSIS — E271 Primary adrenocortical insufficiency: Secondary | ICD-10-CM

## 2019-10-14 DIAGNOSIS — J432 Centrilobular emphysema: Secondary | ICD-10-CM

## 2019-10-14 DIAGNOSIS — E039 Hypothyroidism, unspecified: Secondary | ICD-10-CM

## 2019-10-14 DIAGNOSIS — M810 Age-related osteoporosis without current pathological fracture: Secondary | ICD-10-CM | POA: Diagnosis not present

## 2019-10-14 DIAGNOSIS — E782 Mixed hyperlipidemia: Secondary | ICD-10-CM | POA: Diagnosis not present

## 2019-10-14 DIAGNOSIS — F330 Major depressive disorder, recurrent, mild: Secondary | ICD-10-CM

## 2019-10-14 MED ORDER — LIDOCAINE 5 % EX PTCH: 1.0000 | MEDICATED_PATCH | Freq: Every day | CUTANEOUS | 5 refills | Status: DC | PRN

## 2019-10-14 NOTE — Progress Notes (Signed)
Subjective:  Patient ID: Connie Coffey, female    DOB: 1956/03/14  Age: 64 y.o. MRN: [id number]  Chief Complaint  Patient presents with  . Hyperlipidemia  . Gastroesophageal Reflux  . COPD  . Anxiety    HPI Patient presents with atrophy of thyroid (acquired).  She is currently taking Synthroid, 50 mcg daily.  She denies any related symptoms.      Pt presents with hyperlipidemia.  Current treatment includes Lipitor and Tricor.  Compliance with treatment has been good; she takes her medication as directed, maintains her low cholesterol diet, and follows up as directed.      Connie Coffey presents with a diagnosis of impaired fasting glucose.  The course has been stable and nonprogressive.  Eating healthy. Trying to walk for exercise.      Additionally, she presents with history of simple chronic bronchitis.  the duration of COPD has been several years.  It is associated with dyspnea.  There are no associated symptoms of cough.  Connie Coffey currently smokes 1 pack per day.  On spirivia and proair.      Patient has depression and anxiety. She is currently on trazodone, duloxetine, buspar, and clonazepam.  Patient has osteoporosis. She is taking fosamax once weekly.   Patient has adrenal insufficiency and is currently on cortef 20 mg once daily.    Past Medical History:  Diagnosis Date  . Adrenal insufficiency ([place])   . Anxiety   . Arthritis   . Carpal tunnel syndrome    Both hands, worse in right  . Chronic constipation   . COPD (chronic obstructive pulmonary disease) ([place])   . CVA (cerebral vascular accident) ([place])   . Facet syndrome   . Fibromyalgia   . GERD (gastroesophageal reflux disease)   . Heart murmur   . Hypercholesteremia   . IBS (irritable bowel syndrome)   . Ischemic stroke ([place])    07/19/2004  . Lumbar radiculopathy   . Primary hyperparathyroidism ([place])   . RLS (restless legs syndrome)   . Spinal stenosis   . TMJ (temporomandibular joint disorder)   . Trochanteric  bursitis of both hips   . Vascular malformation    spinal cord   Past Surgical History:  Procedure Laterality Date  . APPENDECTOMY    . CARPAL TUNNEL RELEASE Right 12/13/2017   surgery  . CHOLECYSTECTOMY    . COLONOSCOPY  05/06/2017   Colonic polyps status post polypectomy. Interan land external hemorrhoids.   . ESOPHAGOGASTRODUODENOSCOPY  04/28/2007   Mild gastrtiis. Otherwise normal EGD.   Marland Kitchen HEMORROIDECTOMY    . LAMINECTOMY AND MICRODISCECTOMY LUMBAR SPINE  01/03/2005  . NECK SURGERY     x2   . OTHER SURGICAL HISTORY  04/24/2004   anterior cervical discectomy and fusion at C5-C6  . TONSILLECTOMY    . TOTAL ABDOMINAL HYSTERECTOMY      Family History  Problem Relation Age of Onset  . Supraventricular tachycardia Mother   . Dementia Mother   . Hyperlipidemia Mother   . Hypertension Mother   . Osteoarthritis Mother   . Osteoporosis Mother   . Breast cancer Other   . Breast cancer Other   . Colon cancer Neg Hx   . Esophageal cancer Neg Hx    Social History   Socioeconomic History  . Marital status: Divorced    Spouse name: Not on file  . Number of children: 1  . Years of education: Not on file  . Highest education level: Not on file  Occupational  History  . Occupation: Disabled  Tobacco Use  . Smoking status: Current Every Day Smoker    Packs/day: 1.50    Types: Cigarettes  . Smokeless tobacco: Never Used  Substance and Sexual Activity  . Alcohol use: Not Currently    Comment: quit 19 years  . Drug use: Never  . Sexual activity: Not on file  Other Topics Concern  . Not on file  Social History Narrative  . Not on file   Social Determinants of Health   Financial Resource Strain:   . Difficulty of Paying Living Expenses:   Food Insecurity: No Food Insecurity  . Worried About Charity fundraiser in the Last Year: Never true  . Ran Out of Food in the Last Year: Never true  Transportation Needs: No Transportation Needs  . Lack of Transportation (Medical):  No  . Lack of Transportation (Non-Medical): No  Physical Activity:   . Days of Exercise per Week:   . Minutes of Exercise per Session:   Stress: Stress Concern Present  . Feeling of Stress : Very much  Social Connections:   . Frequency of Communication with Friends and Family:   . Frequency of Social Gatherings with Friends and Family:   . Attends Religious Services:   . Active Member of Clubs or Organizations:   . Attends Archivist Meetings:   Marland Kitchen Marital Status:     Review of Systems  Constitutional: Negative for chills, fatigue and fever.  HENT: Negative for congestion, ear pain, rhinorrhea and sore throat.   Respiratory: Negative for cough and shortness of breath.   Cardiovascular: Negative for chest pain.  Gastrointestinal: Negative for abdominal pain, constipation, diarrhea, nausea and vomiting.  Genitourinary: Negative for dysuria and urgency.  Musculoskeletal: Negative for back pain and myalgias.  Neurological: Negative for dizziness, weakness, light-headedness and headaches.  Psychiatric/Behavioral: Negative for dysphoric mood. The patient is not nervous/anxious.      Objective:  BP 136/78   Wt 128 lb (58.1 kg)   BMI 21.97 kg/m   BP/Weight 10/14/2019 09/08/2019 [id number]  Systolic BP [id number]  Diastolic BP 78 80 76  Wt. (Lbs) 128 132 136  BMI 21.97 22.66 23.34    Physical Exam Vitals reviewed.  Constitutional:      Appearance: Normal appearance. She is normal weight.  Cardiovascular:     Rate and Rhythm: Normal rate and regular rhythm.     Pulses: Normal pulses.     Heart sounds: Normal heart sounds.  Pulmonary:     Effort: Pulmonary effort is normal. No respiratory distress.     Breath sounds: Normal breath sounds.  Abdominal:     General: Abdomen is flat. Bowel sounds are normal.     Palpations: Abdomen is soft.     Tenderness: There is no abdominal tenderness.  Neurological:     Mental Status: She is alert and oriented to person, place,  and time.  Psychiatric:        Mood and Affect: Mood normal.        Behavior: Behavior normal.     Diabetic Foot Exam - Simple   No data filed       Lab Results  Component Value Date   WBC 10.8 10/14/2019   HGB 15.2 10/14/2019   HCT 46.4 10/14/2019   PLT 442 10/14/2019   GLUCOSE 97 10/14/2019   CHOL 170 10/14/2019   TRIG 91 10/14/2019   HDL 85 10/14/2019   LDLCALC 69 10/14/2019   ALT  21 10/14/2019   AST 28 10/14/2019   NA 145 (H) 10/14/2019   K 4.2 10/14/2019   CL 105 10/14/2019   CREATININE 0.79 10/14/2019   BUN 12 10/14/2019   CO2 26 10/14/2019   TSH 0.735 10/14/2019      Assessment & Plan:  1. Acquired hypothyroidism The current medical regimen is effective;  continue present plan and medications. - TSH  2. Mixed hyperlipidemia Well controlled.  No changes to medicines.  Continue to work on eating a healthy diet and exercise.  Labs drawn today.  - Lipid panel - Comprehensive metabolic panel - CBC with Differential/Platelet  3. Adrenal insufficiency (Addison's disease) ([place]) The current medical regimen is effective;  continue present plan and medications.  4. Age-related osteoporosis without current pathological fracture The current medical regimen is effective;  continue present plan and medications.  5. Centrilobular emphysema ([place]) The current medical regimen is effective;  continue present plan and medications.  6. Mild recurrent major depression ([place]) The current medical regimen is effective;  continue present plan and medications.   Meds ordered this encounter  Medications  . lidocaine (LIDODERM) 5 %    Sig: Place 1 patch onto the skin daily as needed. (12 hours on and 12 hours off)    Dispense:  30 patch    Refill:  5    Orders Placed This Encounter  Procedures  . Lipid panel  . Comprehensive metabolic panel  . CBC with Differential/Platelet  . TSH  . Cardiovascular Risk Assessment     Follow-up: Return in about 3 months (around  01/14/2020).  An After Visit Summary was printed and given to the patient.  [name] [facility] [phone]

## 2019-10-15 LAB — LIPID PANEL
Chol/HDL Ratio: 2 ratio (ref 0.0–4.4)
Cholesterol, Total: 170 mg/dL (ref 100–199)
HDL: 85 mg/dL (ref >39)
LDL Chol Calc (NIH): 69 mg/dL (ref 0–99)
Triglycerides: 91 mg/dL (ref 0–149)
VLDL Cholesterol Cal: 16 mg/dL (ref 5–40)

## 2019-10-15 LAB — CBC WITH DIFFERENTIAL/PLATELET
Basophils Absolute: 0.1 10*3/uL (ref 0.0–0.2)
Basos: 1 %
EOS (ABSOLUTE): 0.1 10*3/uL (ref 0.0–0.4)
Eos: 1 %
Hematocrit: 46.4 % (ref 34.0–46.6)
Hemoglobin: 15.2 g/dL (ref 11.1–15.9)
Immature Grans (Abs): 0 10*3/uL (ref 0.0–0.1)
Immature Granulocytes: 0 %
Lymphocytes Absolute: 2.3 10*3/uL (ref 0.7–3.1)
Lymphs: 21 %
MCH: 31 pg (ref 26.6–33.0)
MCHC: 32.8 g/dL (ref 31.5–35.7)
MCV: 95 fL (ref 79–97)
Monocytes Absolute: 0.6 10*3/uL (ref 0.1–0.9)
Monocytes: 5 %
Neutrophils Absolute: 7.8 10*3/uL — ABNORMAL HIGH (ref 1.4–7.0)
Neutrophils: 72 %
Platelets: 442 10*3/uL (ref 150–450)
RBC: 4.9 x10E6/uL (ref 3.77–5.28)
RDW: 13.9 % (ref 11.7–15.4)
WBC: 10.8 10*3/uL (ref 3.4–10.8)

## 2019-10-15 LAB — COMPREHENSIVE METABOLIC PANEL
ALT: 21 IU/L (ref 0–32)
AST: 28 IU/L (ref 0–40)
Albumin/Globulin Ratio: 2 (ref 1.2–2.2)
Albumin: 4.5 g/dL (ref 3.8–4.8)
Alkaline Phosphatase: 34 IU/L — ABNORMAL LOW (ref 48–121)
BUN/Creatinine Ratio: 15 (ref 12–28)
BUN: 12 mg/dL (ref 8–27)
Bilirubin Total: 0.5 mg/dL (ref 0.0–1.2)
CO2: 26 mmol/L (ref 20–29)
Calcium: 9.7 mg/dL (ref 8.7–10.3)
Chloride: 105 mmol/L (ref 96–106)
Creatinine, Ser: 0.79 mg/dL (ref 0.57–1.00)
GFR calc Af Amer: 91 mL/min/{1.73_m2} (ref >59)
GFR calc non Af Amer: 79 mL/min/{1.73_m2} (ref >59)
Globulin, Total: 2.2 g/dL (ref 1.5–4.5)
Glucose: 97 mg/dL (ref 65–99)
Potassium: 4.2 mmol/L (ref 3.5–5.2)
Sodium: 145 mmol/L — ABNORMAL HIGH (ref 134–144)
Total Protein: 6.7 g/dL (ref 6.0–8.5)

## 2019-10-15 LAB — CARDIOVASCULAR RISK ASSESSMENT

## 2019-10-15 LAB — TSH: TSH: 0.735 u[IU]/mL (ref 0.450–4.500)

## 2019-10-19 ENCOUNTER — Encounter: Payer: Self-pay | Admitting: Family Medicine

## 2019-10-19 DIAGNOSIS — E782 Mixed hyperlipidemia: Secondary | ICD-10-CM | POA: Insufficient documentation

## 2019-10-19 DIAGNOSIS — E271 Primary adrenocortical insufficiency: Secondary | ICD-10-CM | POA: Insufficient documentation

## 2019-10-20 ENCOUNTER — Other Ambulatory Visit: Payer: Self-pay | Admitting: Family Medicine

## 2019-10-20 MED ORDER — LUBIPROSTONE 8 MCG PO CAPS
8.0000 ug | ORAL_CAPSULE | Freq: Two times a day (BID) | ORAL | 2 refills | Status: DC
Start: 2019-10-20 — End: 2020-01-18

## 2019-11-04 ENCOUNTER — Telehealth: Payer: Self-pay | Admitting: Family Medicine

## 2019-11-04 NOTE — Progress Notes (Signed)
  Chronic Care Management   Outreach Note  11/04/2019 Name: Connie Coffey MRN: [id number] DOB: November 29, 1955  Referred by: [name], MD Reason for referral : No chief complaint on file.   An unsuccessful telephone outreach was attempted today. The patient was referred to the pharmacist for assistance with care management and care coordination.   This note is not being shared with the patient for the following reason: To respect privacy (The patient or proxy has requested that the information not be shared).  Follow Up Plan:   Earney Hamburg Upstream Scheduler

## 2019-11-09 NOTE — Progress Notes (Signed)
Acute Office Visit  Subjective:    Patient ID: Connie Coffey, female    DOB: [birth year], 64 y.o.   MRN: [id number]  Chief Complaint  Patient presents with  . Cough  . Shortness of Breath    Due to black mold in the bathroom  . Tick Bite    Has removed 4 ticks    HPI Patient is in today for a cough and shortness of breath states she is in the process of moving, in her new house she has been exposed to black mold.  Connie Coffey has removed 4 ticks, states she has some "knots" from where she removed them. Denies any fever.  Past Medical History:  Diagnosis Date  . Adrenal insufficiency ([place])   . Anxiety   . Arthritis   . Carpal tunnel syndrome    Both hands, worse in right  . Chronic constipation   . COPD (chronic obstructive pulmonary disease) ([place])   . CVA (cerebral vascular accident) ([place])   . Facet syndrome   . Fibromyalgia   . GERD (gastroesophageal reflux disease)   . Heart murmur   . Hypercholesteremia   . IBS (irritable bowel syndrome)   . Ischemic stroke ([place])    07/19/2004  . Lumbar radiculopathy   . Primary hyperparathyroidism ([place])   . RLS (restless legs syndrome)   . Spinal stenosis   . TMJ (temporomandibular joint disorder)   . Trochanteric bursitis of both hips   . Vascular malformation    spinal cord    Past Surgical History:  Procedure Laterality Date  . APPENDECTOMY    . CARPAL TUNNEL RELEASE Right 12/13/2017   surgery  . CHOLECYSTECTOMY    . COLONOSCOPY  05/06/2017   Colonic polyps status post polypectomy. Interan land external hemorrhoids.   . ESOPHAGOGASTRODUODENOSCOPY  04/28/2007   Mild gastrtiis. Otherwise normal EGD.   Marland Kitchen HEMORROIDECTOMY    . LAMINECTOMY AND MICRODISCECTOMY LUMBAR SPINE  01/03/2005  . NECK SURGERY     x2   . OTHER SURGICAL HISTORY  04/24/2004   anterior cervical discectomy and fusion at C5-C6  . TONSILLECTOMY    . TOTAL ABDOMINAL HYSTERECTOMY      Family History  Problem Relation Age of Onset  . Supraventricular  tachycardia Mother   . Dementia Mother   . Hyperlipidemia Mother   . Hypertension Mother   . Osteoarthritis Mother   . Osteoporosis Mother   . Breast cancer Other   . Breast cancer Other   . Colon cancer Neg Hx   . Esophageal cancer Neg Hx     Social History   Socioeconomic History  . Marital status: Divorced    Spouse name: Not on file  . Number of children: 1  . Years of education: Not on file  . Highest education level: Not on file  Occupational History  . Occupation: Disabled  Tobacco Use  . Smoking status: Current Every Day Smoker    Packs/day: 1.50    Types: Cigarettes  . Smokeless tobacco: Never Used  Vaping Use  . Vaping Use: Never used  Substance and Sexual Activity  . Alcohol use: Not Currently    Comment: quit 19 years  . Drug use: Never  . Sexual activity: Not on file  Other Topics Concern  . Not on file  Social History Narrative  . Not on file   Social Determinants of Health   Financial Resource Strain:   . Difficulty of Paying Living Expenses:   Food Insecurity: No Food  Insecurity  . Worried About Charity fundraiser in the Last Year: Never true  . Ran Out of Food in the Last Year: Never true  Transportation Needs: No Transportation Needs  . Lack of Transportation (Medical): No  . Lack of Transportation (Non-Medical): No  Physical Activity:   . Days of Exercise per Week:   . Minutes of Exercise per Session:   Stress: Stress Concern Present  . Feeling of Stress : Very much  Social Connections:   . Frequency of Communication with Friends and Family:   . Frequency of Social Gatherings with Friends and Family:   . Attends Religious Services:   . Active Member of Clubs or Organizations:   . Attends Archivist Meetings:   Marland Kitchen Marital Status:   Intimate Partner Violence:   . Fear of Current or Ex-Partner:   . Emotionally Abused:   Marland Kitchen Physically Abused:   . Sexually Abused:     Outpatient Medications Prior to Visit  Medication Sig  Dispense Refill  . albuterol (PROVENTIL HFA;VENTOLIN HFA) 108 (90 Base) MCG/ACT inhaler INHALE 1 PUFF EVERY 4 HOURS AS NEEDED.    Marland Kitchen alendronate (FOSAMAX) 70 MG tablet Take 1 tablet by mouth once a week.    Marland Kitchen atorvastatin (LIPITOR) 40 MG tablet Take 1 tablet by mouth daily.    . busPIRone (BUSPAR) 30 MG tablet TAKE 1 TABLET BY MOUTH  TWICE DAILY 180 tablet 1  . clonazePAM (KLONOPIN) 0.5 MG tablet TAKE 1 TABLET BY MOUTH TWICE A DAY AS NEEDED FOR ANXIETY 60 tablet 0  . cyclobenzaprine (FLEXERIL) 10 MG tablet Take 10 mg by mouth 3 (three) times daily.    . diclofenac sodium (VOLTAREN) 1 % GEL Apply 1 application topically daily as needed.    . DULoxetine (CYMBALTA) 60 MG capsule Take 1 capsule by mouth 2 (two) times daily.    Marland Kitchen DYMISTA 137-50 MCG/ACT SUSP Place 1 spray into the nose daily. 23 g 1  . fenofibrate 160 MG tablet TAKE 1 TABLET BY MOUTH EVERY DAY 90 tablet 0  . gabapentin (NEURONTIN) 600 MG tablet Take 1 tablet by mouth 4 (four) times daily.    . hydrocortisone (CORTEF) 20 MG tablet Take 1 tablet in the morning and 1/2 tablet at 4pm    . levothyroxine (SYNTHROID, LEVOTHROID) 50 MCG tablet Take 1 tablet by mouth every morning.    . lidocaine (LIDODERM) 5 % Place 1 patch onto the skin daily as needed. (12 hours on and 12 hours off) 30 patch 5  . lubiprostone (AMITIZA) 8 MCG capsule Take 1 capsule (8 mcg total) by mouth 2 (two) times daily with a meal. 60 capsule 2  . montelukast (SINGULAIR) 10 MG tablet Take 1 tablet by mouth daily.    . Multiple Vitamin (MULTI-VITAMINS) TABS Take 1 tablet by mouth daily.    . naloxone (NARCAN) nasal spray 4 mg/0.1 mL Place 1 spray into the nose once. (for suspected overdose)    . olopatadine (PATANOL) 0.1 % ophthalmic solution Use 1 drop in each eye twice daily as needed 5 mL 5  . omeprazole (PRILOSEC) 40 MG capsule TAKE 1 CAPSULE BY MOUTH  TWICE DAILY 180 capsule 3  . oxyCODONE-acetaminophen (PERCOCET) 10-325 MG tablet TAKE 1 TAB EVERY 6 HOURS FOR 5 DAYS  THEN 1 TAB TWICE A DAY AS NEEDED FOR SEVERE BREAKTHROUGH PAIN  0  . Tiotropium Bromide Monohydrate (SPIRIVA RESPIMAT) 2.5 MCG/ACT AERS Inhale into the lungs.    Marland Kitchen tiZANidine (ZANAFLEX) 4 MG  tablet Take 1 tablet by mouth 2 (two) times daily.    . traZODone (DESYREL) 100 MG tablet Take 1 tablet (100 mg total) by mouth at bedtime as needed for sleep (take 1 - 2 tablets at bedtime as needed). Take 1-2 tablets at bedtime 60 tablet 0  . Vitamin D, Ergocalciferol, (DRISDOL) 50000 units CAPS capsule Take 1 capsule by mouth once a week.    Marland Kitchen azithromycin (ZITHROMAX) 250 MG tablet 2 tablets on day 1, then 1 tablet daily on days 2-6.2 tablets on day 1, then 1 tablet daily on days 2-6. 6 tablet 0   No facility-administered medications prior to visit.    Allergies  Allergen Reactions  . Celebrex [Celecoxib]     "because of stomach ulcers"  . Chantix [Varenicline]   . Fish Oil Other (See Comments)    hemorrhoids  . Nsaids Other (See Comments)    GI Upset  . Other     beets  . Nicotrol [Nicotine] Nausea Only  . Penicillins Rash    Review of Systems  Constitutional: Negative for chills, fatigue and fever.  HENT: Positive for congestion and hearing loss. Negative for ear pain, rhinorrhea and sore throat.   Respiratory: Positive for cough and shortness of breath.   Cardiovascular: Positive for palpitations. Negative for chest pain.  Gastrointestinal: Positive for nausea. Negative for constipation, diarrhea and vomiting.  Musculoskeletal: Positive for arthralgias, back pain and myalgias.  Neurological: Negative for light-headedness.  Psychiatric/Behavioral: Negative for dysphoric mood. The patient is nervous/anxious.        Objective:    Physical Exam Vitals reviewed.  Constitutional:      Appearance: Normal appearance.  Cardiovascular:     Rate and Rhythm: Normal rate and regular rhythm.  Pulmonary:     Effort: Pulmonary effort is normal.     Breath sounds: Normal breath sounds.    Abdominal:     General: Bowel sounds are normal.  Musculoskeletal:        General: Normal range of motion.     Cervical back: Normal range of motion.  Skin:    Comments: 3 tick bites - one on anterior chest. One on posterior right thing. One right inguinal region. None very large.   Neurological:     Mental Status: She is alert.  Psychiatric:        Mood and Affect: Mood normal.        Behavior: Behavior normal.     BP 112/70   Pulse 82   Temp (!) 97.4 F (36.3 C)   Ht 5\' 4"  (1.626 m)   Wt 121 lb (54.9 kg)   SpO2 96%   BMI 20.77 kg/m  Wt Readings from Last 3 Encounters:  11/10/19 121 lb (54.9 kg)  10/14/19 128 lb (58.1 kg)  09/08/19 132 lb (59.9 kg)    Health Maintenance Due  Topic Date Due  . Hepatitis C Screening  Never done  . COVID-19 Vaccine (1) Never done  . HIV Screening  Never done  . PAP SMEAR-Modifier  Never done  . MAMMOGRAM  03/08/2019  . PNA vac Low Risk Adult (2 of 2 - PPSV23) 05/05/2019    There are no preventive care reminders to display for this patient.   Lab Results  Component Value Date   TSH 0.735 10/14/2019   Lab Results  Component Value Date   WBC 10.8 10/14/2019   HGB 15.2 10/14/2019   HCT 46.4 10/14/2019   MCV 95 10/14/2019   PLT 442  10/14/2019   Lab Results  Component Value Date   NA 145 (H) 10/14/2019   K 4.2 10/14/2019   CO2 26 10/14/2019   GLUCOSE 97 10/14/2019   BUN 12 10/14/2019   CREATININE 0.79 10/14/2019   BILITOT 0.5 10/14/2019   ALKPHOS 34 (L) 10/14/2019   AST 28 10/14/2019   ALT 21 10/14/2019   PROT 6.7 10/14/2019   ALBUMIN 4.5 10/14/2019   CALCIUM 9.7 10/14/2019   Lab Results  Component Value Date   CHOL 170 10/14/2019   Lab Results  Component Value Date   HDL 85 10/14/2019   Lab Results  Component Value Date   LDLCALC 69 10/14/2019   Lab Results  Component Value Date   TRIG 91 10/14/2019   Lab Results  Component Value Date   CHOLHDL 2.0 10/14/2019   No results found for: HGBA1C        Assessment & Plan:  1. Tick bite of left side of chest wall, initial encounter Give doxycycline 100 mg 1 twice a day x 1 day prophylaxis.   [name], MD

## 2019-11-10 ENCOUNTER — Ambulatory Visit (INDEPENDENT_AMBULATORY_CARE_PROVIDER_SITE_OTHER): Payer: Medicare Other | Admitting: Family Medicine

## 2019-11-10 ENCOUNTER — Other Ambulatory Visit: Payer: Self-pay

## 2019-11-10 ENCOUNTER — Encounter: Payer: Self-pay | Admitting: Family Medicine

## 2019-11-10 VITALS — BP 112/70 | HR 82 | Temp 97.4°F | Ht 64.0 in | Wt 121.0 lb

## 2019-11-10 DIAGNOSIS — S20362A Insect bite (nonvenomous) of left front wall of thorax, initial encounter: Secondary | ICD-10-CM

## 2019-11-10 DIAGNOSIS — W57XXXA Bitten or stung by nonvenomous insect and other nonvenomous arthropods, initial encounter: Secondary | ICD-10-CM | POA: Diagnosis not present

## 2019-11-10 MED ORDER — DOXYCYCLINE HYCLATE 100 MG PO TABS: 100.0000 mg | ORAL_TABLET | Freq: Two times a day (BID) | ORAL | 0 refills | Status: DC

## 2019-11-16 ENCOUNTER — Other Ambulatory Visit: Payer: Self-pay | Admitting: Family Medicine

## 2019-11-19 ENCOUNTER — Other Ambulatory Visit: Payer: Self-pay | Admitting: Family Medicine

## 2019-11-23 ENCOUNTER — Other Ambulatory Visit: Payer: Self-pay | Admitting: Family Medicine

## 2019-12-07 ENCOUNTER — Ambulatory Visit (INDEPENDENT_AMBULATORY_CARE_PROVIDER_SITE_OTHER): Payer: Medicare Other | Admitting: Family Medicine

## 2019-12-07 ENCOUNTER — Other Ambulatory Visit: Payer: Self-pay

## 2019-12-07 ENCOUNTER — Encounter: Payer: Self-pay | Admitting: Family Medicine

## 2019-12-07 VITALS — BP 116/64 | HR 78 | Temp 97.3°F | Ht 64.0 in | Wt 128.0 lb

## 2019-12-07 DIAGNOSIS — H6502 Acute serous otitis media, left ear: Secondary | ICD-10-CM

## 2019-12-07 DIAGNOSIS — L259 Unspecified contact dermatitis, unspecified cause: Secondary | ICD-10-CM | POA: Diagnosis not present

## 2019-12-07 MED ORDER — PREDNISONE 20 MG PO TABS
ORAL_TABLET | ORAL | 0 refills | Status: DC
Start: 2019-12-07 — End: 2019-12-29

## 2019-12-07 MED ORDER — AZITHROMYCIN 250 MG PO TABS
ORAL_TABLET | ORAL | 0 refills | Status: DC
Start: 2019-12-07 — End: 2020-01-15

## 2019-12-07 NOTE — Patient Instructions (Addendum)
Trial of Xyzal purchase at the store. (Take Xyzal instead of Benadryl) Gave a sample of 5 tablets of Xyzal, if it helps purchase more at the store. Take prednisone WITH FOOD. May cause a upset stomach. STOP SMOKING. Consider taking Wellbutrin for smoking cessation. Garden early in the morning.

## 2019-12-07 NOTE — Progress Notes (Signed)
Acute Office Visit  Subjective:    Patient ID: Connie Coffey, female    DOB: [birth year], 64 y.o.   MRN: [id number]  Chief Complaint  Patient presents with  . Ear Pain    Left  . Rash    HPI Patient is here today for left ear pain and rash on both arms. Has been treating rash since last Thursday with benadryl and hydrocortisone cream-itchy. Pt states she was working in her yard in short sleeves. Rash has worsened. No SOB.   Past Medical History:  Diagnosis Date  . Adrenal insufficiency ([place])   . Anxiety   . Arthritis   . Carpal tunnel syndrome    Both hands, worse in right  . Chronic constipation   . COPD (chronic obstructive pulmonary disease) ([place])   . CVA (cerebral vascular accident) ([place])   . Facet syndrome   . Fibromyalgia   . GERD (gastroesophageal reflux disease)   . Heart murmur   . Hypercholesteremia   . IBS (irritable bowel syndrome)   . Ischemic stroke ([place])    07/19/2004  . Lumbar radiculopathy   . Primary hyperparathyroidism ([place])   . RLS (restless legs syndrome)   . Spinal stenosis   . TMJ (temporomandibular joint disorder)   . Trochanteric bursitis of both hips   . Vascular malformation    spinal cord    Past Surgical History:  Procedure Laterality Date  . APPENDECTOMY    . CARPAL TUNNEL RELEASE Right 12/13/2017   surgery  . CHOLECYSTECTOMY    . COLONOSCOPY  05/06/2017   Colonic polyps status post polypectomy. Interan land external hemorrhoids.   . ESOPHAGOGASTRODUODENOSCOPY  04/28/2007   Mild gastrtiis. Otherwise normal EGD.   Marland Kitchen HEMORROIDECTOMY    . LAMINECTOMY AND MICRODISCECTOMY LUMBAR SPINE  01/03/2005  . NECK SURGERY     x2   . OTHER SURGICAL HISTORY  04/24/2004   anterior cervical discectomy and fusion at C5-C6  . TONSILLECTOMY    . TOTAL ABDOMINAL HYSTERECTOMY      Family History  Problem Relation Age of Onset  . Supraventricular tachycardia Mother   . Dementia Mother   . Hyperlipidemia Mother   . Hypertension Mother   .  Osteoarthritis Mother   . Osteoporosis Mother   . Breast cancer Other   . Breast cancer Other   . Colon cancer Neg Hx   . Esophageal cancer Neg Hx     Social History   Socioeconomic History  . Marital status: Divorced    Spouse name: Not on file  . Number of children: 1  . Years of education: Not on file  . Highest education level: Not on file  Occupational History  . Occupation: Disabled  Tobacco Use  . Smoking status: Current Every Day Smoker    Packs/day: 1.50    Types: Cigarettes  . Smokeless tobacco: Never Used  Vaping Use  . Vaping Use: Never used  Substance and Sexual Activity  . Alcohol use: Not Currently    Comment: quit 19 years  . Drug use: Never  . Sexual activity: Not on file  Other Topics Concern  . Not on file  Social History Narrative  . Not on file   Social Determinants of Health   Financial Resource Strain:   . Difficulty of Paying Living Expenses:   Food Insecurity: No Food Insecurity  . Worried About Charity fundraiser in the Last Year: Never true  . Ran Out of Food in the Last Year:  Never true  Transportation Needs: No Transportation Needs  . Lack of Transportation (Medical): No  . Lack of Transportation (Non-Medical): No  Physical Activity:   . Days of Exercise per Week:   . Minutes of Exercise per Session:   Stress: Stress Concern Present  . Feeling of Stress : Very much  Social Connections:   . Frequency of Communication with Friends and Family:   . Frequency of Social Gatherings with Friends and Family:   . Attends Religious Services:   . Active Member of Clubs or Organizations:   . Attends Archivist Meetings:   Marland Kitchen Marital Status:   Intimate Partner Violence:   . Fear of Current or Ex-Partner:   . Emotionally Abused:   Marland Kitchen Physically Abused:   . Sexually Abused:     Outpatient Medications Prior to Visit  Medication Sig Dispense Refill  . albuterol (PROVENTIL HFA;VENTOLIN HFA) 108 (90 Base) MCG/ACT inhaler INHALE 1  PUFF EVERY 4 HOURS AS NEEDED.    Marland Kitchen alendronate (FOSAMAX) 70 MG tablet Take 1 tablet by mouth once a week.    Marland Kitchen atorvastatin (LIPITOR) 40 MG tablet Take 1 tablet by mouth daily.    . busPIRone (BUSPAR) 30 MG tablet TAKE 1 TABLET BY MOUTH  TWICE DAILY 180 tablet 1  . clonazePAM (KLONOPIN) 0.5 MG tablet TAKE 1 TABLET BY MOUTH TWICE A DAY AS NEEDED FOR ANXIETY 60 tablet 3  . cyclobenzaprine (FLEXERIL) 10 MG tablet Take 10 mg by mouth 3 (three) times daily.    . diclofenac sodium (VOLTAREN) 1 % GEL Apply 1 application topically daily as needed.    . doxycycline (VIBRA-TABS) 100 MG tablet Take 1 tablet (100 mg total) by mouth 2 (two) times daily. 2 tablet 0  . DULoxetine (CYMBALTA) 60 MG capsule Take 1 capsule by mouth 2 (two) times daily.    Marland Kitchen DYMISTA 137-50 MCG/ACT SUSP PLACE 1 SPRAY INTO THE NOSE DAILY. 23 g 3  . fenofibrate 160 MG tablet TAKE 1 TABLET BY MOUTH EVERY DAY 90 tablet 0  . gabapentin (NEURONTIN) 600 MG tablet Take 1 tablet by mouth 4 (four) times daily.    . hydrocortisone (CORTEF) 20 MG tablet Take 1 tablet in the morning and 1/2 tablet at 4pm    . levothyroxine (SYNTHROID, LEVOTHROID) 50 MCG tablet Take 1 tablet by mouth every morning.    . lidocaine (LIDODERM) 5 % Place 1 patch onto the skin daily as needed. (12 hours on and 12 hours off) 30 patch 5  . lubiprostone (AMITIZA) 8 MCG capsule Take 1 capsule (8 mcg total) by mouth 2 (two) times daily with a meal. 60 capsule 2  . montelukast (SINGULAIR) 10 MG tablet Take 1 tablet by mouth daily.    . Multiple Vitamin (MULTI-VITAMINS) TABS Take 1 tablet by mouth daily.    . naloxone (NARCAN) nasal spray 4 mg/0.1 mL Place 1 spray into the nose once. (for suspected overdose)    . olopatadine (PATANOL) 0.1 % ophthalmic solution Use 1 drop in each eye twice daily as needed 5 mL 5  . omeprazole (PRILOSEC) 40 MG capsule TAKE 1 CAPSULE BY MOUTH  TWICE DAILY 180 capsule 3  . oxyCODONE-acetaminophen (PERCOCET) 10-325 MG tablet TAKE 1 TAB EVERY 6  HOURS FOR 5 DAYS THEN 1 TAB TWICE A DAY AS NEEDED FOR SEVERE BREAKTHROUGH PAIN  0  . Tiotropium Bromide Monohydrate (SPIRIVA RESPIMAT) 2.5 MCG/ACT AERS Inhale into the lungs.    Marland Kitchen tiZANidine (ZANAFLEX) 4 MG tablet Take 1  tablet by mouth 2 (two) times daily.    . traZODone (DESYREL) 100 MG tablet Take 1 tablet (100 mg total) by mouth at bedtime as needed for sleep (take 1 - 2 tablets at bedtime as needed). Take 1-2 tablets at bedtime 60 tablet 0  . Vitamin D, Ergocalciferol, (DRISDOL) 50000 units CAPS capsule Take 1 capsule by mouth once a week.     No facility-administered medications prior to visit.    Allergies  Allergen Reactions  . Celebrex [Celecoxib]     "because of stomach ulcers"  . Chantix [Varenicline]   . Fish Oil Other (See Comments)    hemorrhoids  . Nsaids Other (See Comments)    GI Upset  . Other     beets  . Nicotrol [Nicotine] Nausea Only  . Penicillins Rash    Review of Systems  Constitutional: Negative for chills and fever.  HENT: Positive for ear pain (left). Negative for congestion and sore throat.   Respiratory: Positive for cough.   Skin: Positive for rash (BL arms).       Objective:    Physical Exam Vitals reviewed.  Constitutional:      Appearance: Normal appearance. She is normal weight.  HENT:     Right Ear: Ear canal and external ear normal.     Left Ear: Ear canal and external ear normal.     Ears:     Comments: Left TM +fluid, eryth    Mouth/Throat:     Pharynx: Oropharynx is clear. No oropharyngeal exudate or posterior oropharyngeal erythema.  Eyes:     Extraocular Movements: Extraocular movements intact.     Pupils: Pupils are equal, round, and reactive to light.  Cardiovascular:     Rate and Rhythm: Normal rate and regular rhythm.     Pulses: Normal pulses.     Heart sounds: Normal heart sounds.  Pulmonary:     Effort: Pulmonary effort is normal. No respiratory distress.     Breath sounds: Normal breath sounds.  Abdominal:      General: Abdomen is flat. Bowel sounds are normal.     Palpations: Abdomen is soft.     Tenderness: There is no abdominal tenderness.  Musculoskeletal:     Cervical back: Neck supple.  Skin:    Findings: Rash present.     Comments: eryth plaques bilat anticubital fossa, macules bilat upper arms  Neurological:     Mental Status: She is alert and oriented to person, place, and time.  Psychiatric:        Mood and Affect: Mood normal.        Behavior: Behavior normal.     BP 116/64   Pulse 78   Temp (!) 97.3 F (36.3 C)   Ht 5\' 4"  (1.626 m)   Wt 128 lb (58.1 kg)   SpO2 98%   BMI 21.97 kg/m  Wt Readings from Last 3 Encounters:  12/07/19 128 lb (58.1 kg)  11/10/19 121 lb (54.9 kg)  10/14/19 128 lb (58.1 kg)    Health Maintenance Due  Topic Date Due  . Hepatitis C Screening  Never done  . COVID-19 Vaccine (1) Never done  . HIV Screening  Never done  . PAP SMEAR-Modifier  Never done  . MAMMOGRAM  03/08/2019  . PNA vac Low Risk Adult (2 of 2 - PPSV23) 05/05/2019    Lab Results  Component Value Date   TSH 0.735 10/14/2019   Lab Results  Component Value Date   WBC 10.8 10/14/2019  HGB 15.2 10/14/2019   HCT 46.4 10/14/2019   MCV 95 10/14/2019   PLT 442 10/14/2019   Lab Results  Component Value Date   NA 145 (H) 10/14/2019   K 4.2 10/14/2019   CO2 26 10/14/2019   GLUCOSE 97 10/14/2019   BUN 12 10/14/2019   CREATININE 0.79 10/14/2019   BILITOT 0.5 10/14/2019   ALKPHOS 34 (L) 10/14/2019   AST 28 10/14/2019   ALT 21 10/14/2019   PROT 6.7 10/14/2019   ALBUMIN 4.5 10/14/2019   CALCIUM 9.7 10/14/2019   Lab Results  Component Value Date   CHOL 170 10/14/2019   Lab Results  Component Value Date   HDL 85 10/14/2019   Lab Results  Component Value Date   LDLCALC 69 10/14/2019   Lab Results  Component Value Date   TRIG 91 10/14/2019   Lab Results  Component Value Date   CHOLHDL 2.0 10/14/2019       Assessment & Plan:  1. Acute serous otitis media of  left ear, recurrence not specified zithromax-PEN allergy  2. Contact dermatitis, unspecified contact dermatitis type, unspecified trigger xyzal-otc, samples given for itch and eryth Prednisone-rx-risk/benefit/side effects d/w pt-prevention for the future discussed with pt Follow-up: TOB CESSATION-f/u for appt to discuss Wellbutrin  An After Visit Summary was printed and given to the patient.  [place] [phone]

## 2019-12-10 DIAGNOSIS — M5416 Radiculopathy, lumbar region: Secondary | ICD-10-CM | POA: Diagnosis not present

## 2019-12-29 ENCOUNTER — Other Ambulatory Visit: Payer: Self-pay

## 2019-12-29 ENCOUNTER — Encounter: Payer: Self-pay | Admitting: Legal Medicine

## 2019-12-29 ENCOUNTER — Ambulatory Visit (INDEPENDENT_AMBULATORY_CARE_PROVIDER_SITE_OTHER): Payer: Medicare Other | Admitting: Legal Medicine

## 2019-12-29 DIAGNOSIS — J0100 Acute maxillary sinusitis, unspecified: Secondary | ICD-10-CM | POA: Diagnosis not present

## 2019-12-29 DIAGNOSIS — J019 Acute sinusitis, unspecified: Secondary | ICD-10-CM | POA: Insufficient documentation

## 2019-12-29 MED ORDER — PREDNISONE 10 MG PO TABS: 10.0000 mg | ORAL_TABLET | Freq: Every day | ORAL | 0 refills | Status: DC

## 2019-12-29 MED ORDER — LEVOCETIRIZINE DIHYDROCHLORIDE 5 MG PO TABS: 5.0000 mg | ORAL_TABLET | Freq: Every evening | ORAL | 6 refills | Status: DC

## 2019-12-29 MED ORDER — CEFUROXIME AXETIL 500 MG PO TABS: 500.0000 mg | ORAL_TABLET | Freq: Two times a day (BID) | ORAL | 0 refills | Status: DC

## 2019-12-29 NOTE — Progress Notes (Signed)
Subjective:  Patient ID: Connie Coffey, female    DOB: [birth year]  Age: 64 y.o. MRN: [id number]  Chief Complaint  Patient presents with  . Headache    HPI: patient having frontal headache for 5 days.  She is having a lot of sinus congestion.  Pain over sinuses.  No fever or chills. Living in new home with black mold. We discussed her living conditions at length.   Current Outpatient Medications on File Prior to Visit  Medication Sig Dispense Refill  . albuterol (PROVENTIL HFA;VENTOLIN HFA) 108 (90 Base) MCG/ACT inhaler INHALE 1 PUFF EVERY 4 HOURS AS NEEDED.    Marland Kitchen alendronate (FOSAMAX) 70 MG tablet Take 1 tablet by mouth once a week.    Marland Kitchen atorvastatin (LIPITOR) 40 MG tablet Take 1 tablet by mouth daily.    Marland Kitchen azithromycin (ZITHROMAX) 250 MG tablet Take 2 po today. Then 1 po day 2-5. 6 tablet 0  . busPIRone (BUSPAR) 30 MG tablet TAKE 1 TABLET BY MOUTH  TWICE DAILY 180 tablet 1  . clonazePAM (KLONOPIN) 0.5 MG tablet TAKE 1 TABLET BY MOUTH TWICE A DAY AS NEEDED FOR ANXIETY 60 tablet 3  . cyclobenzaprine (FLEXERIL) 10 MG tablet Take 10 mg by mouth 3 (three) times daily.    . diclofenac sodium (VOLTAREN) 1 % GEL Apply 1 application topically daily as needed.    . doxycycline (VIBRA-TABS) 100 MG tablet Take 1 tablet (100 mg total) by mouth 2 (two) times daily. 2 tablet 0  . DULoxetine (CYMBALTA) 60 MG capsule Take 1 capsule by mouth 2 (two) times daily.    Marland Kitchen DYMISTA 137-50 MCG/ACT SUSP PLACE 1 SPRAY INTO THE NOSE DAILY. 23 g 3  . fenofibrate 160 MG tablet TAKE 1 TABLET BY MOUTH EVERY DAY 90 tablet 0  . gabapentin (NEURONTIN) 600 MG tablet Take 1 tablet by mouth 4 (four) times daily.    . hydrocortisone (CORTEF) 20 MG tablet Take 1 tablet in the morning and 1/2 tablet at 4pm    . levothyroxine (SYNTHROID, LEVOTHROID) 50 MCG tablet Take 1 tablet by mouth every morning.    . lidocaine (LIDODERM) 5 % Place 1 patch onto the skin daily as needed. (12 hours on and 12 hours off) 30 patch 5  .  lubiprostone (AMITIZA) 8 MCG capsule Take 1 capsule (8 mcg total) by mouth 2 (two) times daily with a meal. 60 capsule 2  . montelukast (SINGULAIR) 10 MG tablet Take 1 tablet by mouth daily.    . Multiple Vitamin (MULTI-VITAMINS) TABS Take 1 tablet by mouth daily.    . naloxone (NARCAN) nasal spray 4 mg/0.1 mL Place 1 spray into the nose once. (for suspected overdose)    . olopatadine (PATANOL) 0.1 % ophthalmic solution Use 1 drop in each eye twice daily as needed 5 mL 5  . omeprazole (PRILOSEC) 40 MG capsule TAKE 1 CAPSULE BY MOUTH  TWICE DAILY 180 capsule 3  . oxyCODONE-acetaminophen (PERCOCET) 10-325 MG tablet TAKE 1 TAB EVERY 6 HOURS FOR 5 DAYS THEN 1 TAB TWICE A DAY AS NEEDED FOR SEVERE BREAKTHROUGH PAIN  0  . Tiotropium Bromide Monohydrate (SPIRIVA RESPIMAT) 2.5 MCG/ACT AERS Inhale into the lungs.    . traZODone (DESYREL) 100 MG tablet Take 1 tablet (100 mg total) by mouth at bedtime as needed for sleep (take 1 - 2 tablets at bedtime as needed). Take 1-2 tablets at bedtime 60 tablet 0  . Vitamin D, Ergocalciferol, (DRISDOL) 50000 units CAPS capsule Take 1 capsule by  mouth once a week.     No current facility-administered medications on file prior to visit.   Past Medical History:  Diagnosis Date  . Adrenal insufficiency ([place])   . Arthritis   . Carpal tunnel syndrome    Both hands, worse in right  . Chronic constipation   . COPD (chronic obstructive pulmonary disease) ([place])   . CVA (cerebral vascular accident) ([place])   . Facet syndrome   . Fibromyalgia   . GERD (gastroesophageal reflux disease)   . Heart murmur   . Hypercholesteremia   . IBS (irritable bowel syndrome)   . Ischemic stroke ([place])    07/19/2004  . Lumbar radiculopathy   . RLS (restless legs syndrome)   . Spinal stenosis   . TMJ (temporomandibular joint disorder)   . Trochanteric bursitis of both hips   . Vascular malformation    spinal cord   Past Surgical History:  Procedure Laterality Date  . APPENDECTOMY      . CARPAL TUNNEL RELEASE Right 12/13/2017   surgery  . CHOLECYSTECTOMY    . COLONOSCOPY  05/06/2017   Colonic polyps status post polypectomy. Interan land external hemorrhoids.   . ESOPHAGOGASTRODUODENOSCOPY  04/28/2007   Mild gastrtiis. Otherwise normal EGD.   Marland Kitchen HEMORROIDECTOMY    . LAMINECTOMY AND MICRODISCECTOMY LUMBAR SPINE  01/03/2005  . NECK SURGERY     x2   . OTHER SURGICAL HISTORY  04/24/2004   anterior cervical discectomy and fusion at C5-C6  . TONSILLECTOMY    . TOTAL ABDOMINAL HYSTERECTOMY      Family History  Problem Relation Age of Onset  . Supraventricular tachycardia Mother   . Dementia Mother   . Hyperlipidemia Mother   . Hypertension Mother   . Osteoarthritis Mother   . Osteoporosis Mother   . Breast cancer Other   . Breast cancer Other   . Colon cancer Neg Hx   . Esophageal cancer Neg Hx    Social History   Socioeconomic History  . Marital status: Divorced    Spouse name: Not on file  . Number of children: 1  . Years of education: Not on file  . Highest education level: Not on file  Occupational History  . Occupation: Disabled  Tobacco Use  . Smoking status: Current Every Day Smoker    Packs/day: 1.50    Types: Cigarettes  . Smokeless tobacco: Never Used  Vaping Use  . Vaping Use: Never used  Substance and Sexual Activity  . Alcohol use: Not Currently    Comment: quit 19 years  . Drug use: Never  . Sexual activity: Not on file  Other Topics Concern  . Not on file  Social History Narrative  . Not on file   Social Determinants of Health   Financial Resource Strain:   . Difficulty of Paying Living Expenses:   Food Insecurity: No Food Insecurity  . Worried About Charity fundraiser in the Last Year: Never true  . Ran Out of Food in the Last Year: Never true  Transportation Needs: No Transportation Needs  . Lack of Transportation (Medical): No  . Lack of Transportation (Non-Medical): No  Physical Activity:   . Days of Exercise per Week:    . Minutes of Exercise per Session:   Stress: Stress Concern Present  . Feeling of Stress : Very much  Social Connections:   . Frequency of Communication with Friends and Family:   . Frequency of Social Gatherings with Friends and Family:   .  Attends Religious Services:   . Active Member of Clubs or Organizations:   . Attends Archivist Meetings:   Marland Kitchen Marital Status:     Review of Systems  Constitutional: Negative.   HENT: Negative.   Eyes: Negative.   Respiratory: Negative.   Cardiovascular: Negative.   Gastrointestinal: Negative.   Genitourinary: Negative.   Musculoskeletal: Negative.   Neurological: Negative.   Psychiatric/Behavioral: Negative.      Objective:  BP 110/70   Pulse (!) 101   Temp 97.6 F (36.4 C)   Resp 16   Ht 5\' 4"  (1.626 m)   Wt 129 lb (58.5 kg)   SpO2 92%   BMI 22.14 kg/m   BP/Weight 12/29/2019 12/07/2019 7/00/1749  Systolic BP 449 675 916  Diastolic BP 70 64 70  Wt. (Lbs) 129 128 121  BMI 22.14 21.97 20.77    Physical Exam Vitals reviewed.  Constitutional:      Appearance: She is well-developed.  HENT:     Right Ear: Tympanic membrane normal.     Left Ear: Tympanic membrane normal.     Nose:     Right Turbinates: Enlarged.     Left Turbinates: Enlarged.     Right Sinus: Maxillary sinus tenderness present.     Left Sinus: Maxillary sinus tenderness present.     Mouth/Throat:     Mouth: Mucous membranes are moist.  Eyes:     Extraocular Movements: Extraocular movements intact.     Pupils: Pupils are equal, round, and reactive to light.  Cardiovascular:     Rate and Rhythm: Normal rate.     Heart sounds: Normal heart sounds.  Pulmonary:     Effort: Pulmonary effort is normal.     Breath sounds: Normal breath sounds.  Abdominal:     General: Bowel sounds are normal.     Palpations: Abdomen is soft.  Musculoskeletal:        General: Normal range of motion.     Cervical back: Normal range of motion and neck supple.   Neurological:     Mental Status: She is alert.       Lab Results  Component Value Date   WBC 10.8 10/14/2019   HGB 15.2 10/14/2019   HCT 46.4 10/14/2019   PLT 442 10/14/2019   GLUCOSE 97 10/14/2019   CHOL 170 10/14/2019   TRIG 91 10/14/2019   HDL 85 10/14/2019   LDLCALC 69 10/14/2019   ALT 21 10/14/2019   AST 28 10/14/2019   NA 145 (H) 10/14/2019   K 4.2 10/14/2019   CL 105 10/14/2019   CREATININE 0.79 10/14/2019   BUN 12 10/14/2019   CO2 26 10/14/2019   TSH 0.735 10/14/2019      Assessment & Plan:   1. Acute non-recurrent maxillary sinusitis - predniSONE (DELTASONE) 10 MG tablet; Take 1 tablet (10 mg total) by mouth daily with breakfast. Take 6ills first day , then 5 pills day 2 and then cut down one pill day until gone  Dispense: 21 tablet; Refill: 0 - cefUROXime (CEFTIN) 500 MG tablet; Take 1 tablet (500 mg total) by mouth 2 (two) times daily with a meal.  Dispense: 20 tablet; Refill: 0 Patient will try to rid her new house of black mold and will continue with xyzal daily.  Follow up if headache does not resolve   Meds ordered this encounter  Medications  . predniSONE (DELTASONE) 10 MG tablet    Sig: Take 1 tablet (10 mg total) by mouth  daily with breakfast. Take 6ills first day , then 5 pills day 2 and then cut down one pill day until gone    Dispense:  21 tablet    Refill:  0  . cefUROXime (CEFTIN) 500 MG tablet    Sig: Take 1 tablet (500 mg total) by mouth 2 (two) times daily with a meal.    Dispense:  20 tablet    Refill:  0  . levocetirizine (XYZAL) 5 MG tablet    Sig: Take 1 tablet (5 mg total) by mouth every evening.    Dispense:  30 tablet    Refill:  6    No orders of the defined types were placed in this encounter.    Follow-up: Return if symptoms worsen or fail to improve.  An After Visit Summary was printed and given to the patient.  [place] [phone]

## 2020-01-15 ENCOUNTER — Encounter: Payer: Self-pay | Admitting: Family Medicine

## 2020-01-15 ENCOUNTER — Other Ambulatory Visit: Payer: Self-pay

## 2020-01-15 ENCOUNTER — Ambulatory Visit (INDEPENDENT_AMBULATORY_CARE_PROVIDER_SITE_OTHER): Payer: Medicare Other | Admitting: Family Medicine

## 2020-01-15 VITALS — BP 130/84 | HR 80 | Temp 97.3°F | Resp 16 | Ht 64.0 in | Wt 130.6 lb

## 2020-01-15 DIAGNOSIS — E038 Other specified hypothyroidism: Secondary | ICD-10-CM | POA: Diagnosis not present

## 2020-01-15 DIAGNOSIS — F322 Major depressive disorder, single episode, severe without psychotic features: Secondary | ICD-10-CM | POA: Diagnosis not present

## 2020-01-15 DIAGNOSIS — E782 Mixed hyperlipidemia: Secondary | ICD-10-CM

## 2020-01-15 NOTE — Patient Instructions (Addendum)
Two choices for counseling.  [facility] [address] [place], [place] [address] Make an Appointment [phone]  [facility] in [place], [place] Address: [address], [place], [place] [address] Phone: [phone]

## 2020-01-15 NOTE — Progress Notes (Signed)
Subjective:  Patient ID: Connie Coffey, female    DOB: 18-Aug-1955  Age: 64 y.o. MRN: [id number]  Chief Complaint  Patient presents with  . Hyperlipidemia    HPI Patient presents with atrophy of thyroid (acquired).  She is currently taking Synthroid, 50 mcg daily.  She denies any related symptoms.      Pt presents with hyperlipidemia.  Current treatment includes Lipitor and Tricor.  Compliance with treatment has been good; she takes her medication as directed, maintains her low cholesterol diet, and follows up as directed.      Connie Coffey presents with a diagnosis of impaired fasting glucose.  The course has been stable and nonprogressive.  Eating healthy. Trying to walk for exercise.      Additionally, she presents with history of simple chronic bronchitis.  the duration of COPD has been several years.  It is associated with dyspnea.  There are no associated symptoms of cough.  Connie Coffey currently smokes 1 pack per day.  On spirivia and proair. Her breathing is worse when she is cleaning out her new home of mold.     Patient has depression and anxiety. She is currently on trazodone, duloxetine, buspar, and clonazepam. She is really upset because her adult disabled son has chose to live with his father and stepmother.   Patient has osteoporosis. She is taking fosamax once weekly.   Patient has adrenal insufficiency and is currently on cortef 20 mg once daily.   Back pain with sciatica. Continuing cyclobenzaprine and gabapentin. Currently on percocet from pain clinic.  Past Medical History:  Diagnosis Date  . Adrenal insufficiency ([place])   . Arthritis   . Carpal tunnel syndrome    Both hands, worse in right  . Chronic constipation   . COPD (chronic obstructive pulmonary disease) ([place])   . CVA (cerebral vascular accident) ([place])   . Facet syndrome   . Fibromyalgia   . GERD (gastroesophageal reflux disease)   . Heart murmur   . Hypercholesteremia   . IBS (irritable bowel syndrome)   .  Ischemic stroke ([place])    07/19/2004  . Lumbar radiculopathy   . RLS (restless legs syndrome)   . Spinal stenosis   . TMJ (temporomandibular joint disorder)   . Trochanteric bursitis of both hips   . Vascular malformation    spinal cord   Past Surgical History:  Procedure Laterality Date  . APPENDECTOMY    . CARPAL TUNNEL RELEASE Right 12/13/2017   surgery  . CHOLECYSTECTOMY    . COLONOSCOPY  05/06/2017   Colonic polyps status post polypectomy. Interan land external hemorrhoids.   . ESOPHAGOGASTRODUODENOSCOPY  04/28/2007   Mild gastrtiis. Otherwise normal EGD.   Marland Kitchen HEMORROIDECTOMY    . LAMINECTOMY AND MICRODISCECTOMY LUMBAR SPINE  01/03/2005  . NECK SURGERY     x2   . OTHER SURGICAL HISTORY  04/24/2004   anterior cervical discectomy and fusion at C5-C6  . TONSILLECTOMY    . TOTAL ABDOMINAL HYSTERECTOMY      Family History  Problem Relation Age of Onset  . Supraventricular tachycardia Mother   . Dementia Mother   . Hyperlipidemia Mother   . Hypertension Mother   . Osteoarthritis Mother   . Osteoporosis Mother   . Breast cancer Other   . Breast cancer Other   . Colon cancer Neg Hx   . Esophageal cancer Neg Hx    Social History   Socioeconomic History  . Marital status: Divorced    Spouse name:  Not on file  . Number of children: 1  . Years of education: Not on file  . Highest education level: Not on file  Occupational History  . Occupation: Disabled  Tobacco Use  . Smoking status: Current Every Day Smoker    Packs/day: 1.50    Types: Cigarettes  . Smokeless tobacco: Never Used  Vaping Use  . Vaping Use: Never used  Substance and Sexual Activity  . Alcohol use: Not Currently    Comment: quit 19 years  . Drug use: Never  . Sexual activity: Not on file  Other Topics Concern  . Not on file  Social History Narrative  . Not on file   Social Determinants of Health   Financial Resource Strain:   . Difficulty of Paying Living Expenses: Not on file  Food  Insecurity: No Food Insecurity  . Worried About Charity fundraiser in the Last Year: Never true  . Ran Out of Food in the Last Year: Never true  Transportation Needs: No Transportation Needs  . Lack of Transportation (Medical): No  . Lack of Transportation (Non-Medical): No  Physical Activity:   . Days of Exercise per Week: Not on file  . Minutes of Exercise per Session: Not on file  Stress: Stress Concern Present  . Feeling of Stress : Very much  Social Connections:   . Frequency of Communication with Friends and Family: Not on file  . Frequency of Social Gatherings with Friends and Family: Not on file  . Attends Religious Services: Not on file  . Active Member of Clubs or Organizations: Not on file  . Attends Archivist Meetings: Not on file  . Marital Status: Not on file   Review of Systems  Constitutional: Positive for malaise/fatigue. Negative for chills, diaphoresis and fever.  HENT: Positive for congestion. Negative for ear pain and sore throat.   Respiratory: Positive for cough and shortness of breath. Negative for sputum production.   Cardiovascular: Negative for chest pain and palpitations.  Gastrointestinal: Negative for abdominal pain, constipation, diarrhea, nausea and vomiting.  Genitourinary: Negative for dysuria and urgency.  Musculoskeletal: Positive for back pain, joint pain and myalgias.  Neurological: Positive for weakness. Negative for dizziness and headaches.  Endo/Heme/Allergies: Positive for polydipsia.  Psychiatric/Behavioral: Positive for depression. Negative for suicidal ideas. The patient is nervous/anxious.      Objective:  BP 130/84   Pulse 80   Temp (!) 97.3 F (36.3 C)   Resp 16   Ht 5\' 4"  (4.854 m)   Wt 130 lb 9.6 oz (59.2 kg)   BMI 22.42 kg/m   BP/Weight 01/15/2020 12/29/2019 11/21/348  Systolic BP 093 818 299  Diastolic BP 84 70 64  Wt. (Lbs) 130.6 129 128  BMI 22.42 22.14 21.97   Physical Exam Vitals reviewed.    Constitutional:      Appearance: She is normal weight.  HENT:     Right Ear: Tympanic membrane normal.     Left Ear: Tympanic membrane normal.     Nose: Nose normal.     Mouth/Throat:     Mouth: Mucous membranes are moist.  Cardiovascular:     Rate and Rhythm: Normal rate and regular rhythm.     Heart sounds: Normal heart sounds. No murmur heard.   Pulmonary:     Effort: Pulmonary effort is normal. No respiratory distress.     Breath sounds: Normal breath sounds. No wheezing.  Abdominal:     General: Bowel sounds are normal.  Palpations: Abdomen is soft.     Tenderness: There is no abdominal tenderness.  Neurological:     Mental Status: She is alert and oriented to person, place, and time.  Psychiatric:        Behavior: Behavior normal.     Comments: Anxious, tearful, sad.    Lab Results  Component Value Date   WBC 13.4 (H) 01/15/2020   HGB 14.4 01/15/2020   HCT 43.5 01/15/2020   PLT 435 01/15/2020   GLUCOSE 81 01/15/2020   CHOL 182 01/15/2020   TRIG 118 01/15/2020   HDL 84 01/15/2020   LDLCALC 78 01/15/2020   ALT 14 01/15/2020   AST 15 01/15/2020   NA 144 01/15/2020   K 3.5 01/15/2020   CL 103 01/15/2020   CREATININE 0.77 01/15/2020   BUN 7 (L) 01/15/2020   CO2 27 01/15/2020   TSH 0.902 01/15/2020      Assessment & Plan:  1. Acquired hypothyroidism The current medical regimen is effective;  continue present plan and medications. - TSH  2. Mixed hyperlipidemia Well controlled.  No changes to medicines.  Continue to work on eating a healthy diet and exercise.  Labs drawn today.  - Lipid panel - Comprehensive metabolic panel - CBC with Differential/Platelet  3. Adrenal insufficiency (Addison's disease) ([place]) The current medical regimen is effective;  continue present plan and medications.  4. Age-related osteoporosis without current pathological fracture The current medical regimen is effective;  continue present plan and medications.  5.  Centrilobular emphysema ([place]) The current medical regimen is effective;  continue present plan and medications.  6. Moderate recurrent major depression (HCC) The current medical regimen is partially effective;  continue present plan and medications. I would recommend counseling. Contacts given to patient.  Contracted for safety.   Orders Placed This Encounter  Procedures  . CBC with Differential/Platelet  . Comprehensive metabolic panel  . Lipid panel  . TSH  . Cardiovascular Risk Assessment     Follow-up: Return in about 3 months (around 04/16/2020) for fasting.  An After Visit Summary was printed and given to the patient.  [name] [facility] [phone]

## 2020-01-16 ENCOUNTER — Other Ambulatory Visit: Payer: Self-pay | Admitting: Family Medicine

## 2020-01-16 LAB — CBC WITH DIFFERENTIAL/PLATELET
Basophils Absolute: 0.1 10*3/uL (ref 0.0–0.2)
Basos: 1 %
EOS (ABSOLUTE): 0.1 10*3/uL (ref 0.0–0.4)
Eos: 0 %
Hematocrit: 43.5 % (ref 34.0–46.6)
Hemoglobin: 14.4 g/dL (ref 11.1–15.9)
Immature Grans (Abs): 0.1 10*3/uL (ref 0.0–0.1)
Immature Granulocytes: 1 %
Lymphocytes Absolute: 1.8 10*3/uL (ref 0.7–3.1)
Lymphs: 13 %
MCH: 31 pg (ref 26.6–33.0)
MCHC: 33.1 g/dL (ref 31.5–35.7)
MCV: 94 fL (ref 79–97)
Monocytes Absolute: 0.6 10*3/uL (ref 0.1–0.9)
Monocytes: 5 %
Neutrophils Absolute: 10.7 10*3/uL — ABNORMAL HIGH (ref 1.4–7.0)
Neutrophils: 80 %
Platelets: 435 10*3/uL (ref 150–450)
RBC: 4.64 x10E6/uL (ref 3.77–5.28)
RDW: 13.3 % (ref 11.7–15.4)
WBC: 13.4 10*3/uL — ABNORMAL HIGH (ref 3.4–10.8)

## 2020-01-16 LAB — COMPREHENSIVE METABOLIC PANEL
ALT: 14 IU/L (ref 0–32)
AST: 15 IU/L (ref 0–40)
Albumin/Globulin Ratio: 2.4 — ABNORMAL HIGH (ref 1.2–2.2)
Albumin: 4.4 g/dL (ref 3.8–4.8)
Alkaline Phosphatase: 36 IU/L — ABNORMAL LOW (ref 48–121)
BUN/Creatinine Ratio: 9 — ABNORMAL LOW (ref 12–28)
BUN: 7 mg/dL — ABNORMAL LOW (ref 8–27)
Bilirubin Total: 0.3 mg/dL (ref 0.0–1.2)
CO2: 27 mmol/L (ref 20–29)
Calcium: 8.9 mg/dL (ref 8.7–10.3)
Chloride: 103 mmol/L (ref 96–106)
Creatinine, Ser: 0.77 mg/dL (ref 0.57–1.00)
GFR calc Af Amer: 94 mL/min/{1.73_m2} (ref >59)
GFR calc non Af Amer: 81 mL/min/{1.73_m2} (ref >59)
Globulin, Total: 1.8 g/dL (ref 1.5–4.5)
Glucose: 81 mg/dL (ref 65–99)
Potassium: 3.5 mmol/L (ref 3.5–5.2)
Sodium: 144 mmol/L (ref 134–144)
Total Protein: 6.2 g/dL (ref 6.0–8.5)

## 2020-01-16 LAB — TSH: TSH: 0.902 u[IU]/mL (ref 0.450–4.500)

## 2020-01-16 LAB — LIPID PANEL
Chol/HDL Ratio: 2.2 ratio (ref 0.0–4.4)
Cholesterol, Total: 182 mg/dL (ref 100–199)
HDL: 84 mg/dL (ref >39)
LDL Chol Calc (NIH): 78 mg/dL (ref 0–99)
Triglycerides: 118 mg/dL (ref 0–149)
VLDL Cholesterol Cal: 20 mg/dL (ref 5–40)

## 2020-01-16 LAB — CARDIOVASCULAR RISK ASSESSMENT

## 2020-01-20 ENCOUNTER — Other Ambulatory Visit: Payer: Self-pay | Admitting: Family Medicine

## 2020-01-26 DIAGNOSIS — M5134 Other intervertebral disc degeneration, thoracic region: Secondary | ICD-10-CM | POA: Diagnosis not present

## 2020-01-26 DIAGNOSIS — M5412 Radiculopathy, cervical region: Secondary | ICD-10-CM | POA: Diagnosis not present

## 2020-01-26 DIAGNOSIS — M5416 Radiculopathy, lumbar region: Secondary | ICD-10-CM | POA: Diagnosis not present

## 2020-02-03 ENCOUNTER — Other Ambulatory Visit: Payer: Self-pay

## 2020-02-03 MED ORDER — MONTELUKAST SODIUM 10 MG PO TABS: 10.0000 mg | ORAL_TABLET | Freq: Every day | ORAL | 3 refills | Status: DC

## 2020-02-15 ENCOUNTER — Other Ambulatory Visit: Payer: Self-pay | Admitting: Physician Assistant

## 2020-02-16 ENCOUNTER — Telehealth (INDEPENDENT_AMBULATORY_CARE_PROVIDER_SITE_OTHER): Payer: Medicare Other | Admitting: Family Medicine

## 2020-02-16 ENCOUNTER — Encounter: Payer: Self-pay | Admitting: Family Medicine

## 2020-02-16 VITALS — BP 110/80 | HR 80 | Wt 130.0 lb

## 2020-02-16 DIAGNOSIS — J410 Simple chronic bronchitis: Secondary | ICD-10-CM | POA: Diagnosis not present

## 2020-02-16 DIAGNOSIS — J018 Other acute sinusitis: Secondary | ICD-10-CM

## 2020-02-16 MED ORDER — CEFDINIR 300 MG PO CAPS: 300.0000 mg | ORAL_CAPSULE | Freq: Two times a day (BID) | ORAL | 0 refills | Status: DC

## 2020-02-16 MED ORDER — DYMISTA 137-50 MCG/ACT NA SUSP
NASAL | 3 refills | Status: DC
Start: 2020-02-16 — End: 2020-11-14

## 2020-02-16 NOTE — Progress Notes (Signed)
Virtual Visit via Telephone Note   This visit type was conducted due to national recommendations for restrictions regarding the COVID-19 Pandemic (e.g. social distancing) in an effort to limit this patient's exposure and mitigate transmission in our community.  Due to her co-morbid illnesses, this patient is at least at moderate risk for complications without adequate follow up.  This format is felt to be most appropriate for this patient at this time.  The patient did not have access to video technology/had technical difficulties with video requiring transitioning to audio format only (telephone).  All issues noted in this document were discussed and addressed.  No physical exam could be performed with this format.  Patient verbally consented to a telehealth visit.   Date:  02/16/2020   ID:  Connie Coffey, DOB 1955/06/28, MRN [id number]  Patient Location: Home Provider Location: Office/Clinic  PCP:  [name], MD   Evaluation Performed: acute visit  Chief Complaint:  Nasal congestion  History of Present Illness:    Connie Coffey is a 64 y.o. female with history of copd complaining of nasal congestion, sinus pressure, sob, coughing (mildly productive) x 1 week. No fever, no chills, chest pain. Using spiriva 2 inhalations three times a day. She is supposed to be taking it twice a day. She is not needing her rescue inhaler. Very tired. No loss of taste and smell.   The patient does not have symptoms concerning for COVID-19 infection (fever, chills, cough, or new shortness of breath).    Past Medical History:  Diagnosis Date  . Adrenal insufficiency ([place])   . Arthritis   . Carpal tunnel syndrome    Both hands, worse in right  . Chronic constipation   . COPD (chronic obstructive pulmonary disease) ([place])   . CVA (cerebral vascular accident) ([place])   . Facet syndrome   . Fibromyalgia   . GERD (gastroesophageal reflux disease)   . Heart murmur   . Hypercholesteremia   . IBS  (irritable bowel syndrome)   . Ischemic stroke ([place])    07/19/2004  . Lumbar radiculopathy   . RLS (restless legs syndrome)   . Spinal stenosis   . TMJ (temporomandibular joint disorder)   . Trochanteric bursitis of both hips   . Vascular malformation    spinal cord    Past Surgical History:  Procedure Laterality Date  . APPENDECTOMY    . CARPAL TUNNEL RELEASE Right 12/13/2017   surgery  . CHOLECYSTECTOMY    . COLONOSCOPY  05/06/2017   Colonic polyps status post polypectomy. Interan land external hemorrhoids.   . ESOPHAGOGASTRODUODENOSCOPY  04/28/2007   Mild gastrtiis. Otherwise normal EGD.   Marland Kitchen HEMORROIDECTOMY    . LAMINECTOMY AND MICRODISCECTOMY LUMBAR SPINE  01/03/2005  . NECK SURGERY     x2   . OTHER SURGICAL HISTORY  04/24/2004   anterior cervical discectomy and fusion at C5-C6  . TONSILLECTOMY    . TOTAL ABDOMINAL HYSTERECTOMY      Family History  Problem Relation Age of Onset  . Supraventricular tachycardia Mother   . Dementia Mother   . Hyperlipidemia Mother   . Hypertension Mother   . Osteoarthritis Mother   . Osteoporosis Mother   . Breast cancer Other   . Breast cancer Other   . Colon cancer Neg Hx   . Esophageal cancer Neg Hx     Social History   Socioeconomic History  . Marital status: Divorced    Spouse name: Not on file  . Number  of children: 1  . Years of education: Not on file  . Highest education level: Not on file  Occupational History  . Occupation: Disabled  Tobacco Use  . Smoking status: Current Every Day Smoker    Packs/day: 1.50    Types: Cigarettes  . Smokeless tobacco: Never Used  Vaping Use  . Vaping Use: Never used  Substance and Sexual Activity  . Alcohol use: Not Currently    Comment: quit 19 years  . Drug use: Never  . Sexual activity: Not on file  Other Topics Concern  . Not on file  Social History Narrative  . Not on file   Social Determinants of Health   Financial Resource Strain:   . Difficulty of Paying  Living Expenses: Not on file  Food Insecurity: No Food Insecurity  . Worried About Charity fundraiser in the Last Year: Never true  . Ran Out of Food in the Last Year: Never true  Transportation Needs: No Transportation Needs  . Lack of Transportation (Medical): No  . Lack of Transportation (Non-Medical): No  Physical Activity:   . Days of Exercise per Week: Not on file  . Minutes of Exercise per Session: Not on file  Stress: Stress Concern Present  . Feeling of Stress : Very much  Social Connections:   . Frequency of Communication with Friends and Family: Not on file  . Frequency of Social Gatherings with Friends and Family: Not on file  . Attends Religious Services: Not on file  . Active Member of Clubs or Organizations: Not on file  . Attends Archivist Meetings: Not on file  . Marital Status: Not on file  Intimate Partner Violence:   . Fear of Current or Ex-Partner: Not on file  . Emotionally Abused: Not on file  . Physically Abused: Not on file  . Sexually Abused: Not on file    Outpatient Medications Prior to Visit  Medication Sig Dispense Refill  . albuterol (PROVENTIL HFA;VENTOLIN HFA) 108 (90 Base) MCG/ACT inhaler INHALE 1 PUFF EVERY 4 HOURS AS NEEDED.    Marland Kitchen alendronate (FOSAMAX) 70 MG tablet Take 1 tablet by mouth once a week.    Marland Kitchen atorvastatin (LIPITOR) 40 MG tablet TAKE 1 TABLET BY MOUTH  DAILY 90 tablet 3  . busPIRone (BUSPAR) 30 MG tablet TAKE 1 TABLET BY MOUTH  TWICE DAILY 180 tablet 1  . clonazePAM (KLONOPIN) 0.5 MG tablet TAKE 1 TABLET BY MOUTH TWICE A DAY AS NEEDED FOR ANXIETY 60 tablet 3  . cyclobenzaprine (FLEXERIL) 10 MG tablet Take 10 mg by mouth 3 (three) times daily.    . diclofenac sodium (VOLTAREN) 1 % GEL Apply 1 application topically daily as needed.    . DULoxetine (CYMBALTA) 60 MG capsule Take 1 capsule by mouth 2 (two) times daily.    . fenofibrate 160 MG tablet TAKE 1 TABLET BY MOUTH EVERY DAY 90 tablet 0  . gabapentin (NEURONTIN) 600 MG  tablet Take 1 tablet by mouth 4 (four) times daily.    . hydrocortisone (CORTEF) 20 MG tablet Take 1 tablet in the morning and 1/2 tablet at 4pm    . levocetirizine (XYZAL) 5 MG tablet Take 1 tablet (5 mg total) by mouth every evening. 30 tablet 6  . levothyroxine (SYNTHROID, LEVOTHROID) 50 MCG tablet Take 1 tablet by mouth every morning.    . lidocaine (LIDODERM) 5 % Place 1 patch onto the skin daily as needed. (12 hours on and 12 hours off) 30 patch  5  . lubiprostone (AMITIZA) 8 MCG capsule TAKE 1 CAPSULE (8 MCG TOTAL) BY MOUTH 2 (TWO) TIMES DAILY WITH A MEAL. 60 capsule 2  . montelukast (SINGULAIR) 10 MG tablet Take 1 tablet (10 mg total) by mouth daily. 90 tablet 3  . Multiple Vitamin (MULTI-VITAMINS) TABS Take 1 tablet by mouth daily.    . naloxone (NARCAN) nasal spray 4 mg/0.1 mL Place 1 spray into the nose once. (for suspected overdose)    . olopatadine (PATANOL) 0.1 % ophthalmic solution Use 1 drop in each eye twice daily as needed 5 mL 5  . omeprazole (PRILOSEC) 40 MG capsule TAKE 1 CAPSULE BY MOUTH  TWICE DAILY 180 capsule 3  . oxyCODONE-acetaminophen (PERCOCET) 10-325 MG tablet TAKE 1 TAB EVERY 6 HOURS FOR 5 DAYS THEN 1 TAB TWICE A DAY AS NEEDED FOR SEVERE BREAKTHROUGH PAIN  0  . Tiotropium Bromide Monohydrate (SPIRIVA RESPIMAT) 2.5 MCG/ACT AERS Inhale 2 puffs into the lungs 2 (two) times daily.    . traZODone (DESYREL) 100 MG tablet Take 1 tablet (100 mg total) by mouth at bedtime as needed for sleep (take 1 - 2 tablets at bedtime as needed). Take 1-2 tablets at bedtime 60 tablet 0  . Vitamin D, Ergocalciferol, (DRISDOL) 50000 units CAPS capsule Take 1 capsule by mouth once a week.    . DYMISTA 137-50 MCG/ACT SUSP PLACE 1 SPRAY INTO THE NOSE DAILY. 23 g 3   No facility-administered medications prior to visit.   .med Allergies:   Celebrex [celecoxib], Chantix [varenicline], Fish oil, Nsaids, Other, Nicotrol [nicotine], and Penicillins   Social History   Tobacco Use  . Smoking  status: Current Every Day Smoker    Packs/day: 1.50    Types: Cigarettes  . Smokeless tobacco: Never Used  Vaping Use  . Vaping Use: Never used  Substance Use Topics  . Alcohol use: Not Currently    Comment: quit 19 years  . Drug use: Never     Review of Systems  Constitutional: Negative for chills, fever and malaise/fatigue.  HENT: Positive for congestion, ear pain and sinus pain. Negative for sore throat.   Respiratory: Positive for cough. Negative for shortness of breath.   Cardiovascular: Negative for chest pain.  Musculoskeletal: Negative for myalgias.  Neurological: Negative for headaches.     Labs/Other Tests and Data Reviewed:    Recent Labs: 01/15/2020: ALT 14; BUN 7; Creatinine, Ser 0.77; Hemoglobin 14.4; Platelets 435; Potassium 3.5; Sodium 144; TSH 0.902   Recent Lipid Panel Lab Results  Component Value Date/Time   CHOL 182 01/15/2020 11:10 AM   TRIG 118 01/15/2020 11:10 AM   HDL 84 01/15/2020 11:10 AM   CHOLHDL 2.2 01/15/2020 11:10 AM   LDLCALC 78 01/15/2020 11:10 AM    Wt Readings from Last 3 Encounters:  02/16/20 130 lb (59 kg)  01/15/20 130 lb 9.6 oz (59.2 kg)  12/29/19 129 lb (58.5 kg)     Objective:    Vital Signs:  BP 110/80   Pulse 80   Wt 130 lb (59 kg)   BMI 22.31 kg/m    Physical Exam  Patient has been sick for one week, but sounded pretty good on phone. Not short of breath. Not in distress.  ASSESSMENT & PLAN:   1. Acute non-recurrent sinusitis of other sinus  Rx: cefdinir  May take dymista 1 inhalation each nostril twice a day.   2. Simple chronic bronchitis.   Change spiriva to 2 puffs twice a day.   Use  rescue inhaler if needed.   Due to length of time pt has been sick and the lack of sob/coughing over the phone, I believe covid 19 is very unlikely. If she worsens, she is instructed to call back.   COVID-19 Education: The signs and symptoms of COVID-19 were discussed with the patient and how to seek care for testing (follow up  with PCP or arrange E-visit). The importance of social distancing was discussed today.  Time:   Today, I have spent 10 minutes with the patient with telehealth technology discussing the above problems.    Follow Up:  Virtual Visit  prn  Signed, [name], MD  02/16/2020 4:10 PM    [place]

## 2020-03-03 ENCOUNTER — Ambulatory Visit (INDEPENDENT_AMBULATORY_CARE_PROVIDER_SITE_OTHER): Payer: Medicare Other | Admitting: Family Medicine

## 2020-03-03 ENCOUNTER — Other Ambulatory Visit: Payer: Self-pay

## 2020-03-03 VITALS — BP 110/64 | HR 105 | Temp 97.4°F | Ht 64.0 in | Wt 133.0 lb

## 2020-03-03 DIAGNOSIS — Z7952 Long term (current) use of systemic steroids: Secondary | ICD-10-CM

## 2020-03-03 DIAGNOSIS — F17218 Nicotine dependence, cigarettes, with other nicotine-induced disorders: Secondary | ICD-10-CM

## 2020-03-03 DIAGNOSIS — G250 Essential tremor: Secondary | ICD-10-CM | POA: Diagnosis not present

## 2020-03-03 DIAGNOSIS — Z1231 Encounter for screening mammogram for malignant neoplasm of breast: Secondary | ICD-10-CM

## 2020-03-03 DIAGNOSIS — J440 Chronic obstructive pulmonary disease with acute lower respiratory infection: Secondary | ICD-10-CM | POA: Diagnosis not present

## 2020-03-03 DIAGNOSIS — J209 Acute bronchitis, unspecified: Secondary | ICD-10-CM | POA: Diagnosis not present

## 2020-03-03 MED ORDER — BREZTRI AEROSPHERE 160-9-4.8 MCG/ACT IN AERO: 2.0000 | INHALATION_SPRAY | Freq: Two times a day (BID) | RESPIRATORY_TRACT | 1 refills | Status: DC

## 2020-03-03 MED ORDER — TRIAMCINOLONE ACETONIDE 40 MG/ML IJ SUSP
80.0000 mg | Freq: Once | INTRAMUSCULAR | Status: AC
  Administered 2020-03-03: 80 mg via INTRAMUSCULAR

## 2020-03-03 MED ORDER — PRIMIDONE 50 MG PO TABS: 50.0000 mg | ORAL_TABLET | Freq: Two times a day (BID) | ORAL | 0 refills | Status: DC

## 2020-03-03 NOTE — Patient Instructions (Signed)
Start Breztri 2 puffs twice a day.  Stop spiriva.

## 2020-03-03 NOTE — Progress Notes (Signed)
Subjective:  Patient ID: Connie Coffey, female    DOB: 08-17-1955  Age: 64 y.o. MRN: [id number]  Chief Complaint  Patient presents with  . Tremors    HPI  Patient presents for today for tremors. Patient states she has been experiencing tremors for the past 3 months. She does not recall if her tremorring happens more in the morning vs at night. She has noticed that when she is under stress, feeling very anxious or in pain the tremors are worse.  Current Outpatient Medications on File Prior to Visit  Medication Sig Dispense Refill  . alendronate (FOSAMAX) 70 MG tablet Take 1 tablet by mouth once a week.    Marland Kitchen atorvastatin (LIPITOR) 40 MG tablet TAKE 1 TABLET BY MOUTH  DAILY 90 tablet 3  . busPIRone (BUSPAR) 30 MG tablet TAKE 1 TABLET BY MOUTH  TWICE DAILY 180 tablet 1  . clonazePAM (KLONOPIN) 0.5 MG tablet TAKE 1 TABLET BY MOUTH TWICE A DAY AS NEEDED FOR ANXIETY 60 tablet 3  . cyclobenzaprine (FLEXERIL) 10 MG tablet Take 10 mg by mouth 3 (three) times daily.    . diclofenac sodium (VOLTAREN) 1 % GEL Apply 1 application topically daily as needed.    . DULoxetine (CYMBALTA) 60 MG capsule Take 1 capsule by mouth 2 (two) times daily.    Marland Kitchen DYMISTA 137-50 MCG/ACT SUSP One spray each nostril twice a day. 23 g 3  . fenofibrate 160 MG tablet TAKE 1 TABLET BY MOUTH EVERY DAY 90 tablet 0  . gabapentin (NEURONTIN) 600 MG tablet Take 1 tablet by mouth 4 (four) times daily.    . hydrocortisone (CORTEF) 20 MG tablet Take 1 tablet in the morning and 1/2 tablet at 4pm    . levocetirizine (XYZAL) 5 MG tablet Take 1 tablet (5 mg total) by mouth every evening. 30 tablet 6  . levothyroxine (SYNTHROID, LEVOTHROID) 50 MCG tablet Take 1 tablet by mouth every morning.    . lidocaine (LIDODERM) 5 % Place 1 patch onto the skin daily as needed. (12 hours on and 12 hours off) 30 patch 5  . lubiprostone (AMITIZA) 8 MCG capsule TAKE 1 CAPSULE (8 MCG TOTAL) BY MOUTH 2 (TWO) TIMES DAILY WITH A MEAL. 60 capsule 2  .  montelukast (SINGULAIR) 10 MG tablet Take 1 tablet (10 mg total) by mouth daily. 90 tablet 3  . Multiple Vitamin (MULTI-VITAMINS) TABS Take 1 tablet by mouth daily.    . naloxone (NARCAN) nasal spray 4 mg/0.1 mL Place 1 spray into the nose once. (for suspected overdose)    . olopatadine (PATANOL) 0.1 % ophthalmic solution Use 1 drop in each eye twice daily as needed 5 mL 5  . omeprazole (PRILOSEC) 40 MG capsule TAKE 1 CAPSULE BY MOUTH  TWICE DAILY 180 capsule 3  . oxyCODONE-acetaminophen (PERCOCET) 10-325 MG tablet TAKE 1 TAB EVERY 6 HOURS FOR 5 DAYS THEN 1 TAB TWICE A DAY AS NEEDED FOR SEVERE BREAKTHROUGH PAIN  0  . traZODone (DESYREL) 100 MG tablet Take 1 tablet (100 mg total) by mouth at bedtime as needed for sleep (take 1 - 2 tablets at bedtime as needed). Take 1-2 tablets at bedtime 60 tablet 0  . Vitamin D, Ergocalciferol, (DRISDOL) 50000 units CAPS capsule Take 1 capsule by mouth once a week.     No current facility-administered medications on file prior to visit.   Past Medical History:  Diagnosis Date  . Adrenal insufficiency ([place])   . Arthritis   . Carpal  tunnel syndrome    Both hands, worse in right  . Chronic constipation   . COPD (chronic obstructive pulmonary disease) ([place])   . CVA (cerebral vascular accident) ([place])   . Facet syndrome   . Fibromyalgia   . GERD (gastroesophageal reflux disease)   . Heart murmur   . Hypercholesteremia   . IBS (irritable bowel syndrome)   . Ischemic stroke ([place])    07/19/2004  . Lumbar radiculopathy   . RLS (restless legs syndrome)   . Spinal stenosis   . TMJ (temporomandibular joint disorder)   . Trochanteric bursitis of both hips   . Vascular malformation    spinal cord   Past Surgical History:  Procedure Laterality Date  . APPENDECTOMY    . CARPAL TUNNEL RELEASE Right 12/13/2017   surgery  . CHOLECYSTECTOMY    . COLONOSCOPY  05/06/2017   Colonic polyps status post polypectomy. Interan land external hemorrhoids.   .  ESOPHAGOGASTRODUODENOSCOPY  04/28/2007   Mild gastrtiis. Otherwise normal EGD.   Marland Kitchen HEMORROIDECTOMY    . LAMINECTOMY AND MICRODISCECTOMY LUMBAR SPINE  01/03/2005  . NECK SURGERY     x2   . OTHER SURGICAL HISTORY  04/24/2004   anterior cervical discectomy and fusion at C5-C6  . TONSILLECTOMY    . TOTAL ABDOMINAL HYSTERECTOMY      Family History  Problem Relation Age of Onset  . Supraventricular tachycardia Mother   . Dementia Mother   . Hyperlipidemia Mother   . Hypertension Mother   . Osteoarthritis Mother   . Osteoporosis Mother   . Breast cancer Other   . Breast cancer Other   . Colon cancer Neg Hx   . Esophageal cancer Neg Hx    Social History   Socioeconomic History  . Marital status: Divorced    Spouse name: Not on file  . Number of children: 1  . Years of education: Not on file  . Highest education level: Not on file  Occupational History  . Occupation: Disabled  Tobacco Use  . Smoking status: Current Every Day Smoker    Packs/day: 1.50    Types: Cigarettes  . Smokeless tobacco: Never Used  Vaping Use  . Vaping Use: Never used  Substance and Sexual Activity  . Alcohol use: Not Currently    Comment: quit 19 years  . Drug use: Never  . Sexual activity: Not on file  Other Topics Concern  . Not on file  Social History Narrative  . Not on file   Social Determinants of Health   Financial Resource Strain:   . Difficulty of Paying Living Expenses: Not on file  Food Insecurity: No Food Insecurity  . Worried About Charity fundraiser in the Last Year: Never true  . Ran Out of Food in the Last Year: Never true  Transportation Needs: No Transportation Needs  . Lack of Transportation (Medical): No  . Lack of Transportation (Non-Medical): No  Physical Activity:   . Days of Exercise per Week: Not on file  . Minutes of Exercise per Session: Not on file  Stress: Stress Concern Present  . Feeling of Stress : Very much  Social Connections:   . Frequency of  Communication with Friends and Family: Not on file  . Frequency of Social Gatherings with Friends and Family: Not on file  . Attends Religious Services: Not on file  . Active Member of Clubs or Organizations: Not on file  . Attends Archivist Meetings: Not on file  .  Marital Status: Not on file    Review of Systems  Constitutional: Negative for chills and fever.  HENT: Positive for congestion and rhinorrhea. Negative for sore throat.   Respiratory: Positive for cough and shortness of breath. Negative for chest tightness.   Gastrointestinal: Negative for abdominal pain, nausea and vomiting.  Genitourinary: Negative for dysuria and urgency.  Musculoskeletal: Negative for neck pain (uses lidocaine patches).  Psychiatric/Behavioral: Positive for dysphoric mood. The patient is nervous/anxious.      Objective:  BP 110/64   Pulse (!) 105   Temp (!) 97.4 F (36.3 C)   Ht 5\' 4"  (1.626 m)   Wt 133 lb (60.3 kg)   SpO2 97%   BMI 22.83 kg/m   BP/Weight 03/03/2020 02/16/2020 8/67/6720  Systolic BP 947 096 283  Diastolic BP 64 80 84  Wt. (Lbs) 133 130 130.6  BMI 22.83 22.31 22.42    Physical Exam Vitals reviewed.  Constitutional:      Appearance: Normal appearance. She is normal weight.  Cardiovascular:     Rate and Rhythm: Normal rate and regular rhythm.     Pulses: Normal pulses.     Heart sounds: Normal heart sounds.  Pulmonary:     Effort: Pulmonary effort is normal.     Breath sounds: Wheezing present.  Abdominal:     General: Abdomen is flat. Bowel sounds are normal.     Palpations: Abdomen is soft.     Tenderness: There is no abdominal tenderness.  Neurological:     Mental Status: She is alert and oriented to person, place, and time.     Comments: Voluntary tremor. No resting tremor.    Psychiatric:        Mood and Affect: Mood normal.        Behavior: Behavior normal.    Diabetic Foot Exam - Simple   No data filed       Lab Results  Component Value  Date   WBC 13.4 (H) 01/15/2020   HGB 14.4 01/15/2020   HCT 43.5 01/15/2020   PLT 435 01/15/2020   GLUCOSE 81 01/15/2020   CHOL 182 01/15/2020   TRIG 118 01/15/2020   HDL 84 01/15/2020   LDLCALC 78 01/15/2020   ALT 14 01/15/2020   AST 15 01/15/2020   NA 144 01/15/2020   K 3.5 01/15/2020   CL 103 01/15/2020   CREATININE 0.77 01/15/2020   BUN 7 (L) 01/15/2020   CO2 27 01/15/2020   TSH 0.902 01/15/2020      Assessment & Plan:   1. Acute bronchitis with COPD ([place]) - Budeson-Glycopyrrol-Formoterol (BREZTRI AEROSPHERE) 160-9-4.8 MCG/ACT AERO; Inhale 2 puffs into the lungs in the morning and at bedtime.  Dispense: 32.1 g; Refill: 1 Stop spiriva. - DG Chest 2 View; Future - triamcinolone acetonide (KENALOG-40) injection 80 mg  2. Benign familial tremor Start on primidone 50 mg one twice a day.  3. Cigarette nicotine dependence with other nicotine-induced disorder Recommend cessation.  4. Current chronic use of systemic steroids - DG Bone Density  5. Visit for screening mammogram - MM Digital Screening    Meds ordered this encounter  Medications  . Budeson-Glycopyrrol-Formoterol (BREZTRI AEROSPHERE) 160-9-4.8 MCG/ACT AERO    Sig: Inhale 2 puffs into the lungs in the morning and at bedtime.    Dispense:  32.1 g    Refill:  1  . primidone (MYSOLINE) 50 MG tablet    Sig: Take 1 tablet (50 mg total) by mouth 2 (two) times daily.  Dispense:  60 tablet    Refill:  0  . triamcinolone acetonide (KENALOG-40) injection 80 mg    Orders Placed This Encounter  Procedures  . DG Chest 2 View  . MM Digital Screening  . DG Bone Density     Follow-up: No follow-ups on file.  An After Visit Summary was printed and given to the patient.  [name] [facility] [phone]

## 2020-03-07 ENCOUNTER — Encounter: Payer: Self-pay | Admitting: Family Medicine

## 2020-03-07 DIAGNOSIS — J449 Chronic obstructive pulmonary disease, unspecified: Secondary | ICD-10-CM | POA: Diagnosis not present

## 2020-03-07 DIAGNOSIS — R0602 Shortness of breath: Secondary | ICD-10-CM | POA: Diagnosis not present

## 2020-03-07 DIAGNOSIS — J209 Acute bronchitis, unspecified: Secondary | ICD-10-CM | POA: Diagnosis not present

## 2020-03-07 DIAGNOSIS — J440 Chronic obstructive pulmonary disease with acute lower respiratory infection: Secondary | ICD-10-CM | POA: Diagnosis not present

## 2020-03-07 DIAGNOSIS — R059 Cough, unspecified: Secondary | ICD-10-CM | POA: Diagnosis not present

## 2020-03-10 ENCOUNTER — Other Ambulatory Visit: Payer: Self-pay

## 2020-03-10 DIAGNOSIS — E559 Vitamin D deficiency, unspecified: Secondary | ICD-10-CM | POA: Diagnosis not present

## 2020-03-10 DIAGNOSIS — E039 Hypothyroidism, unspecified: Secondary | ICD-10-CM | POA: Diagnosis not present

## 2020-03-10 DIAGNOSIS — E2740 Unspecified adrenocortical insufficiency: Secondary | ICD-10-CM | POA: Diagnosis not present

## 2020-03-10 DIAGNOSIS — M810 Age-related osteoporosis without current pathological fracture: Secondary | ICD-10-CM | POA: Diagnosis not present

## 2020-03-10 DIAGNOSIS — Z8639 Personal history of other endocrine, nutritional and metabolic disease: Secondary | ICD-10-CM | POA: Diagnosis not present

## 2020-03-10 MED ORDER — ALBUTEROL SULFATE HFA 108 (90 BASE) MCG/ACT IN AERS: 1.0000 | INHALATION_SPRAY | RESPIRATORY_TRACT | 3 refills | Status: DC | PRN

## 2020-03-12 ENCOUNTER — Encounter: Payer: Self-pay | Admitting: Family Medicine

## 2020-03-14 ENCOUNTER — Telehealth: Payer: Self-pay

## 2020-03-14 NOTE — Telephone Encounter (Signed)
Ask how tremor is with primidone? Consider decrease in primidone to decrease constipation, but still helping tremor.  Recommended she call her pulmonologist.

## 2020-03-14 NOTE — Telephone Encounter (Signed)
Pt called sts that when she used Albuterol  yesterday morning she coughed up drainage and  blood was present in it. So she didn't use it last night or this morning and there has been no blood when she cough. Pt also sts that she has been constipated every since she has started primidone, and wants to know what could she do about it?

## 2020-03-14 NOTE — Telephone Encounter (Signed)
Connie Coffey called to report that she has been coughing up some blood tinged sputum about the size of a dime over the last 24 hours.  She denies fever, chills, or shortness of breath.  Dr. Tobie Poet advised that she call her pulmonary physician for further work-up and treatment.  She agreed to call.

## 2020-03-15 DIAGNOSIS — J432 Centrilobular emphysema: Secondary | ICD-10-CM | POA: Diagnosis not present

## 2020-03-15 DIAGNOSIS — F17210 Nicotine dependence, cigarettes, uncomplicated: Secondary | ICD-10-CM | POA: Diagnosis not present

## 2020-03-15 DIAGNOSIS — R911 Solitary pulmonary nodule: Secondary | ICD-10-CM | POA: Diagnosis not present

## 2020-03-15 DIAGNOSIS — R059 Cough, unspecified: Secondary | ICD-10-CM | POA: Diagnosis not present

## 2020-03-15 DIAGNOSIS — R918 Other nonspecific abnormal finding of lung field: Secondary | ICD-10-CM | POA: Diagnosis not present

## 2020-03-16 ENCOUNTER — Telehealth: Payer: Self-pay

## 2020-03-16 NOTE — Telephone Encounter (Signed)
Patient's insurance faxed over a clarification for patient's ventolin inhailer and stated that her insurance does not cover it any more, they provided a covered alternative which is PROAIR HFA or PROAIR Respiclick. If you can please send in one of the alternatives for the patient to [facility] to replace the Ventolin Inhailer. [place]

## 2020-03-17 DIAGNOSIS — M5416 Radiculopathy, lumbar region: Secondary | ICD-10-CM | POA: Diagnosis not present

## 2020-03-18 ENCOUNTER — Other Ambulatory Visit: Payer: Self-pay | Admitting: Family Medicine

## 2020-03-22 ENCOUNTER — Other Ambulatory Visit: Payer: Self-pay

## 2020-03-22 ENCOUNTER — Other Ambulatory Visit: Payer: Self-pay | Admitting: Family Medicine

## 2020-03-23 DIAGNOSIS — I2510 Atherosclerotic heart disease of native coronary artery without angina pectoris: Secondary | ICD-10-CM | POA: Diagnosis not present

## 2020-03-23 DIAGNOSIS — I700 Atherosclerosis of aorta: Secondary | ICD-10-CM | POA: Diagnosis not present

## 2020-03-23 DIAGNOSIS — R911 Solitary pulmonary nodule: Secondary | ICD-10-CM | POA: Diagnosis not present

## 2020-03-23 DIAGNOSIS — J40 Bronchitis, not specified as acute or chronic: Secondary | ICD-10-CM | POA: Diagnosis not present

## 2020-03-23 DIAGNOSIS — J432 Centrilobular emphysema: Secondary | ICD-10-CM | POA: Diagnosis not present

## 2020-03-28 ENCOUNTER — Other Ambulatory Visit: Payer: Self-pay | Admitting: Family Medicine

## 2020-04-04 ENCOUNTER — Other Ambulatory Visit: Payer: Self-pay | Admitting: Physician Assistant

## 2020-04-04 ENCOUNTER — Other Ambulatory Visit: Payer: Self-pay | Admitting: Family Medicine

## 2020-04-06 DIAGNOSIS — M810 Age-related osteoporosis without current pathological fracture: Secondary | ICD-10-CM | POA: Diagnosis not present

## 2020-04-06 DIAGNOSIS — Z1231 Encounter for screening mammogram for malignant neoplasm of breast: Secondary | ICD-10-CM | POA: Diagnosis not present

## 2020-04-06 DIAGNOSIS — Z7952 Long term (current) use of systemic steroids: Secondary | ICD-10-CM | POA: Diagnosis not present

## 2020-04-06 DIAGNOSIS — M8589 Other specified disorders of bone density and structure, multiple sites: Secondary | ICD-10-CM | POA: Diagnosis not present

## 2020-04-06 DIAGNOSIS — M85861 Other specified disorders of bone density and structure, right lower leg: Secondary | ICD-10-CM | POA: Diagnosis not present

## 2020-04-08 ENCOUNTER — Other Ambulatory Visit: Payer: Self-pay

## 2020-04-08 DIAGNOSIS — F17210 Nicotine dependence, cigarettes, uncomplicated: Secondary | ICD-10-CM | POA: Diagnosis not present

## 2020-04-08 DIAGNOSIS — J441 Chronic obstructive pulmonary disease with (acute) exacerbation: Secondary | ICD-10-CM | POA: Diagnosis not present

## 2020-04-08 DIAGNOSIS — R918 Other nonspecific abnormal finding of lung field: Secondary | ICD-10-CM | POA: Diagnosis not present

## 2020-04-08 DIAGNOSIS — R911 Solitary pulmonary nodule: Secondary | ICD-10-CM | POA: Diagnosis not present

## 2020-04-08 MED ORDER — LUBIPROSTONE 8 MCG PO CAPS: 8.0000 ug | ORAL_CAPSULE | Freq: Two times a day (BID) | ORAL | 1 refills | Status: DC

## 2020-04-08 MED ORDER — PRIMIDONE 50 MG PO TABS: 50.0000 mg | ORAL_TABLET | Freq: Two times a day (BID) | ORAL | 1 refills | Status: DC

## 2020-04-08 MED ORDER — MONTELUKAST SODIUM 10 MG PO TABS: 10.0000 mg | ORAL_TABLET | Freq: Every day | ORAL | 3 refills | Status: DC

## 2020-04-08 MED ORDER — OMEPRAZOLE 40 MG PO CPDR: 40.0000 mg | DELAYED_RELEASE_CAPSULE | Freq: Two times a day (BID) | ORAL | 3 refills | Status: DC

## 2020-04-08 MED ORDER — FENOFIBRATE 160 MG PO TABS: 160.0000 mg | ORAL_TABLET | Freq: Every day | ORAL | 1 refills | Status: DC

## 2020-04-08 MED ORDER — ROMOSOZUMAB-AQQG 105 MG/1.17ML ~~LOC~~ SOSY: 210.0000 mg | PREFILLED_SYRINGE | Freq: Once | SUBCUTANEOUS | 0 refills | Status: AC

## 2020-04-08 MED ORDER — BUSPIRONE HCL 30 MG PO TABS: 30.0000 mg | ORAL_TABLET | Freq: Two times a day (BID) | ORAL | 1 refills | Status: DC

## 2020-04-11 DIAGNOSIS — M5134 Other intervertebral disc degeneration, thoracic region: Secondary | ICD-10-CM | POA: Diagnosis not present

## 2020-04-11 DIAGNOSIS — M5416 Radiculopathy, lumbar region: Secondary | ICD-10-CM | POA: Diagnosis not present

## 2020-04-11 DIAGNOSIS — M5412 Radiculopathy, cervical region: Secondary | ICD-10-CM | POA: Diagnosis not present

## 2020-04-20 ENCOUNTER — Ambulatory Visit (INDEPENDENT_AMBULATORY_CARE_PROVIDER_SITE_OTHER): Payer: Medicare Other | Admitting: Nurse Practitioner

## 2020-04-20 ENCOUNTER — Encounter: Payer: Self-pay | Admitting: Nurse Practitioner

## 2020-04-20 ENCOUNTER — Other Ambulatory Visit: Payer: Self-pay

## 2020-04-20 VITALS — BP 124/68 | HR 86 | Temp 97.5°F | Ht 64.0 in | Wt 139.0 lb

## 2020-04-20 DIAGNOSIS — M810 Age-related osteoporosis without current pathological fracture: Secondary | ICD-10-CM | POA: Diagnosis not present

## 2020-04-20 DIAGNOSIS — M79671 Pain in right foot: Secondary | ICD-10-CM | POA: Diagnosis not present

## 2020-04-20 DIAGNOSIS — G894 Chronic pain syndrome: Secondary | ICD-10-CM | POA: Diagnosis not present

## 2020-04-20 DIAGNOSIS — Z8739 Personal history of other diseases of the musculoskeletal system and connective tissue: Secondary | ICD-10-CM | POA: Diagnosis not present

## 2020-04-20 DIAGNOSIS — M84374A Stress fracture, right foot, initial encounter for fracture: Secondary | ICD-10-CM | POA: Diagnosis not present

## 2020-04-20 DIAGNOSIS — M5386 Other specified dorsopathies, lumbar region: Secondary | ICD-10-CM | POA: Diagnosis not present

## 2020-04-20 DIAGNOSIS — J42 Unspecified chronic bronchitis: Secondary | ICD-10-CM

## 2020-04-20 DIAGNOSIS — M19071 Primary osteoarthritis, right ankle and foot: Secondary | ICD-10-CM | POA: Diagnosis not present

## 2020-04-20 DIAGNOSIS — M7989 Other specified soft tissue disorders: Secondary | ICD-10-CM | POA: Diagnosis not present

## 2020-04-20 DIAGNOSIS — M816 Localized osteoporosis [Lequesne]: Secondary | ICD-10-CM | POA: Diagnosis not present

## 2020-04-20 DIAGNOSIS — F17210 Nicotine dependence, cigarettes, uncomplicated: Secondary | ICD-10-CM

## 2020-04-20 NOTE — Patient Instructions (Addendum)
Obtain right foot x-ray at [facility] today Rest, ice, elevate right foot  Continue prescribed pain medicine Keep follow-up appointment with Dr Tobie Poet 04/26/20 at 10:00-fasting  RICE Therapy for Routine Care of Injuries Many injuries can be cared for with rest, ice, compression, and elevation (RICE therapy). This includes:  Resting the injured part.  Putting ice on the injury.  Putting pressure (compression) on the injury.  Raising the injured part (elevation). Using RICE therapy can help to lessen pain and swelling. Supplies needed:  Ice.  Plastic bag.  Towel.  Elastic bandage.  Pillow or pillows to raise (elevate) your injured body part. How to care for your injury with RICE therapy Rest Limit your normal activities, and try not to use the injured part of your body. You can go back to your normal activities when your doctor says it is okay to do them and you feel okay. Ask your doctor if you should do exercises to help your injury get better. Ice Put ice on the injured area. Do not put ice on your bare skin.  Put ice in a plastic bag.  Place a towel between your skin and the bag.  Leave the ice on for 20 minutes, 2-3 times a day. Use ice on as many days as told by your doctor.  Compression Compression means putting pressure on the injured area. This can be done with an elastic bandage. If an elastic bandage has been put on your injury:  Do not wrap the bandage too tight. Wrap the bandage more loosely if part of your body away from the bandage is blue, swollen, cold, painful, or loses feeling (gets numb).  Take off the bandage and put it on again. Do this every 3-4 hours or as told by your doctor.  See your doctor if the bandage seems to make your problems worse.  Elevation Elevation means keeping the injured area raised. If you can, raise the injured area above your heart or the center of your chest. Contact a doctor if:  You keep having pain and  swelling.  Your symptoms get worse. Get help right away if:  You have sudden bad pain at your injury or lower than your injury.  You have redness or more swelling around your injury.  You have tingling or numbness at your injury or lower than your injury, and it does not go away when you take off the bandage. Summary  Many injuries can be cared for using rest, ice, compression, and elevation (RICE therapy).  You can go back to your normal activities when you feel okay and your doctor says it is okay.  Put ice on the injured area as told by your doctor.  Get help if your symptoms get worse or if you keep having pain and swelling. This information is not intended to replace advice given to you by your health care provider. Make sure you discuss any questions you have with your health care provider. Document Revised: 02/01/2017 Document Reviewed: 02/01/2017 Elsevier Patient Education  [place] Pain Many things can cause foot pain. Some common causes are:  An injury.  A sprain.  Arthritis.  Blisters.  Bunions. Follow these instructions at home: Managing pain, stiffness, and swelling If directed, put ice on the painful area:  Put ice in a plastic bag.  Place a towel between your skin and the bag.  Leave the ice on for 20 minutes, 2-3 times a day.  Activity  Do not stand  or walk for [place] periods.  Return to your normal activities as told by your health care provider. Ask your health care provider what activities are safe for you.  Do stretches to relieve foot pain and stiffness as told by your health care provider.  Do not lift anything that is heavier than 10 lb (4.5 kg), or the limit that you are told, until your health care provider says that it is safe. Lifting a lot of weight can put added pressure on your feet. Lifestyle  Wear comfortable, supportive shoes that fit you well. Do not wear high heels.  Keep your feet clean and dry. General  instructions  Take over-the-counter and prescription medicines only as told by your health care provider.  Rub your foot gently.  Pay attention to any changes in your symptoms.  Keep all follow-up visits as told by your health care provider. This is important. Contact a health care provider if:  Your pain does not get better after a few days of self-care.  Your pain gets worse.  You cannot stand on your foot. Get help right away if:  Your foot is numb or tingling.  Your foot or toes are swollen.  Your foot or toes turn white or blue.  You have warmth and redness along your foot. Summary  Common causes of foot pain are injury, sprain, arthritis, blisters or bunions.  Ice, medicines, and comfortable shoes may help foot pain.  Contact your health care provider if your pain does not get better after a few days of self-care. This information is not intended to replace advice given to you by your  Osteoporosis  Osteoporosis happens when your bones get thin and weak. This can cause your bones to break (fracture) more easily. You can do things at home to make your bones stronger. Follow these instructions at home:  Activity  Exercise as told by your doctor. Ask your doctor what activities are safe for you. You should do: ? Exercises that make your muscles work to hold your body weight up (weight-bearing exercises). These include tai chi, yoga, and walking. ? Exercises to make your muscles stronger. One example is lifting weights. Lifestyle  Limit alcohol intake to no more than 1 drink a day for nonpregnant women and 2 drinks a day for men. One drink equals 12 oz of beer, 5 oz of wine, or 1 oz of hard liquor.  Do not use any products that have nicotine or tobacco in them. These include cigarettes and e-cigarettes. If you need help quitting, ask your doctor. Preventing falls  Use tools to help you move around (mobility aids) as needed. These include canes, walkers, scooters,  and crutches.  Keep rooms well-lit and free of clutter.  Put away things that could make you trip. These include cords and rugs.  Install safety rails on stairs. Install grab bars in bathrooms.  Use rubber mats in slippery areas, like bathrooms.  Wear shoes that: ? Fit you well. ? Support your feet. ? Have closed toes. ? Have rubber soles or low heels.  Tell your doctor about all of the medicines you are taking. Some medicines can make you more likely to fall. General instructions  Eat plenty of calcium and vitamin D. These nutrients are good for your bones. Good sources of calcium and vitamin D include: ? Some fatty fish, such as salmon and tuna. ? Foods that have calcium and vitamin D added to them (fortified foods). For example, some breakfast cereals are fortified  with calcium and vitamin D. ? Egg yolks. ? Cheese. ? Liver.  Take over-the-counter and prescription medicines only as told by your doctor.  Keep all follow-up visits as told by your doctor. This is important. Contact a doctor if:  You have not been tested (screened) for osteoporosis and you are: ? A woman who is age 17 or older. ? A man who is age 3 or older. Get help right away if:  You fall.  You get hurt. Summary  Osteoporosis happens when your bones get thin and weak.  Weak bones can break (fracture) more easily.  Eat plenty of calcium and vitamin D. These nutrients are good for your bones.  Tell your doctor about all of the medicines that you take. This information is not intended to replace advice given to you by your health care provider. Make sure you discuss any questions you have with your health care provider. Document Revised: 04/26/2017 Document Reviewed: 03/08/2017 Elsevier Patient Education  2020 [employer]. health care provider. Make sure you discuss any questions you have with your health care provider. Document Revised: 02/27/2018 Document Reviewed: 02/27/2018 Elsevier Patient  Education  2020 [employer].  Stress Fracture  A stress fracture is a small break or crack in a bone. A stress fracture can be fully broken (complete) or partially broken (incomplete). The most common sites for stress fractures are the bones in the front of your feet (metatarsals), your heel (calcaneus), and the Guin bone of your lower leg (tibia). What are the causes? This condition is caused by overuse or repetitive exercise, such as running. It happens when a bone cannot absorb any more shock because the muscles around it are weak. Stress fractures happen most commonly when:  You rapidly increase or start a new physical activity.  You use shoes that are worn out or do not fit properly.  You exercise on a new surface. What increases the risk? You are more likely to develop this condition if:  You have a condition that causes weak bones (osteoporosis).  You are female. Stress fractures are more likely to occur in women. What are the signs or symptoms? The most common symptom of a stress fracture is feeling pain when you are using or putting weight on the affected part of your body. The pain usually improves when you are resting. Other symptoms may include:  Swelling of the affected area.  Pain in the area when it is touched. Stress fracture pain usually develops over time. How is this diagnosed? This condition may be diagnosed by:  Your symptoms.  Your medical history.  A physical exam.  Imaging tests, such as: ? X-rays. ? MRI. ? Bone scan. How is this treated? Treatment depends on the severity of your stress fracture. It is commonly treated with resting, icing, compression, and elevation (RICE therapy). Treatment may also include:  Medicines to reduce inflammation.  A cast or a walking shoe.  Crutches.  Surgery. This is usually only in severe cases. Follow these instructions at home: If you have a cast:  Do not put pressure on any part of the cast until it is  fully hardened. This may take several hours.  Do not stick anything inside the cast to scratch your skin. Doing that increases your risk of infection.  Check the skin around the cast every day. Tell your health care provider about any concerns.  You may put lotion on dry skin around the edges of the cast. Do not put lotion on  the skin underneath the cast.  Keep the cast clean.  If the cast is not waterproof: ? Do not let it get wet. ? Cover it with a watertight covering when you take a bath or a shower. If you have a walking shoe:  Wear the shoe as told by your health care provider. Remove it only as told by your health care provider.  Loosen the shoe if your toes tingle, become numb, or turn cold and blue.  Keep the shoe clean.  If the shoe is not waterproof: ? Do not let it get wet. Managing pain, stiffness, and swelling   If directed, apply ice to the injured area: ? If you have a walking shoe, remove the shoe as told by your health care provider. ? Put ice in a plastic bag. ? Place a towel between your skin and the bag or between your cast and the bag. ? Leave the ice on for 20 minutes, 2-3 times per day.  Move your toes often to avoid stiffness and to lessen swelling.  Raise (elevate) the injured area above the level of your heart while you are sitting or lying down. Activity  Rest as directed by your health care provider. Ask your health care provider if you may do alternative exercises, such as swimming or biking, while you are healing.  Return to your normal activities as directed by your health care provider. Ask your health care provider what activities are safe for you.  Perform range-of-motion exercises only as directed by your health care provider. General instructions  Do not use the injured limb to support yourbody weight until your health care provider says that you can. Use crutches if your health care provider tells you to do so.  Do not use any  products that contain nicotine or tobacco, such as cigarettes and e-cigarettes. These can delay bone healing. If you need help quitting, ask your health care provider.  Take over-the-counter and prescription medicines only as told by your health care provider.  Keep all follow-up visits as told by your health care provider. This is important. How is this prevented?  Only wear shoes that: ? Fit well. ? Are not worn out.  Eat a healthy diet that contains vitamin D and calcium. This helps keep your bones strong. Good sources of calcium and vitamin D include: ? Low-fat dairy products such as milk, yogurt, and cheese. ? Certain fish, such as fresh or canned salmon, tuna, and sardines. ? Products that have calcium and vitamin D added to them (fortified products), such as fortified cereals or juice.  Be careful when you start a new physical activity. Give your body time to adjust.  Avoid doing only one kind of activity. Do different exercises, such as swimming and running, so that no single part of your body gets overused.  Do strength-training exercises. Contact a health care provider if:  Your pain gets worse.  You have new symptoms.  You have increased swelling. Get help right away if:  You lose feeling in the injured area. Summary  A stress fracture is a small break or crack in a bone. A stress fracture can be fully broken (complete) or partially broken (incomplete).  This condition is caused by overuse or repetitive exercise, such as running.  The most common symptom of a stress fracture is feeling pain when you are using or putting weight on the affected part of your body.  Treatment depends on the severity of your stress fracture. This information  is not intended to replace advice given to you by your health care provider. Make sure you discuss any questions you have with your health care provider. Document Revised: 06/25/2017 Document Reviewed: 06/25/2017 Elsevier Patient  Education  2020 [employer].

## 2020-04-20 NOTE — Progress Notes (Signed)
Acute Office Visit  Subjective:    Patient ID: Connie Coffey, female    DOB: 02/22/1956, 64 y.o.   MRN: [id number]  Chief Complaint  Patient presents with  . Right foot and ankle pain    HPI Patient is a 64 year old Caucasian female in today for evaluation of right foot/ankle pain. Initial injury occurred on 04/02/2020 when patient experienced a fall and twisted her right foot on the way to the ground. Pt states the pain is 10/10 and accompanied by edema. Aggravating factors include weight-bearing activities. Alleviating factors include rest, elevation, and Percocet. She is using a cane to assist with ambulation. Pt has a past medical history of osteoporosis with previous left leg fracture. Other medical conditions include COPD, chronic back pain, hypoparathyroidism, hyperlipidemia, and nicotine dependence.    Past Medical History:  Diagnosis Date  . Adrenal insufficiency ([place])   . Arthritis   . Carpal tunnel syndrome    Both hands, worse in right  . Chronic constipation   . COPD (chronic obstructive pulmonary disease) ([place])   . CVA (cerebral vascular accident) ([place])   . Facet syndrome   . Fibromyalgia   . GERD (gastroesophageal reflux disease)   . Heart murmur   . Hypercholesteremia   . IBS (irritable bowel syndrome)   . Ischemic stroke ([place])    07/19/2004  . Lumbar radiculopathy   . RLS (restless legs syndrome)   . Spinal stenosis   . TMJ (temporomandibular joint disorder)   . Trochanteric bursitis of both hips   . Vascular malformation    spinal cord    Past Surgical History:  Procedure Laterality Date  . APPENDECTOMY    . CARPAL TUNNEL RELEASE Right 12/13/2017   surgery  . CHOLECYSTECTOMY    . COLONOSCOPY  05/06/2017   Colonic polyps status post polypectomy. Interan land external hemorrhoids.   . ESOPHAGOGASTRODUODENOSCOPY  04/28/2007   Mild gastrtiis. Otherwise normal EGD.   Marland Kitchen HEMORROIDECTOMY    . LAMINECTOMY AND MICRODISCECTOMY LUMBAR SPINE  01/03/2005  .  NECK SURGERY     x2   . OTHER SURGICAL HISTORY  04/24/2004   anterior cervical discectomy and fusion at C5-C6  . TONSILLECTOMY    . TOTAL ABDOMINAL HYSTERECTOMY      Family History  Problem Relation Age of Onset  . Supraventricular tachycardia Mother   . Dementia Mother   . Hyperlipidemia Mother   . Hypertension Mother   . Osteoarthritis Mother   . Osteoporosis Mother   . Breast cancer Other   . Breast cancer Other   . Colon cancer Neg Hx   . Esophageal cancer Neg Hx     Social History   Socioeconomic History  . Marital status: Divorced    Spouse name: Not on file  . Number of children: 1  . Years of education: Not on file  . Highest education level: Not on file  Occupational History  . Occupation: Disabled  Tobacco Use  . Smoking status: Current Every Day Smoker    Packs/day: 1.50    Types: Cigarettes  . Smokeless tobacco: Never Used  Vaping Use  . Vaping Use: Never used  Substance and Sexual Activity  . Alcohol use: Not Currently    Comment: quit 19 years  . Drug use: Never  . Sexual activity: Not on file  Other Topics Concern  . Not on file  Social History Narrative  . Not on file   Social Determinants of Health   Financial Resource Strain:   .  Difficulty of Paying Living Expenses: Not on file  Food Insecurity: No Food Insecurity  . Worried About Charity fundraiser in the Last Year: Never true  . Ran Out of Food in the Last Year: Never true  Transportation Needs: No Transportation Needs  . Lack of Transportation (Medical): No  . Lack of Transportation (Non-Medical): No  Physical Activity:   . Days of Exercise per Week: Not on file  . Minutes of Exercise per Session: Not on file  Stress: Stress Concern Present  . Feeling of Stress : Very much  Social Connections:   . Frequency of Communication with Friends and Family: Not on file  . Frequency of Social Gatherings with Friends and Family: Not on file  . Attends Religious Services: Not on file  .  Active Member of Clubs or Organizations: Not on file  . Attends Archivist Meetings: Not on file  . Marital Status: Not on file  Intimate Partner Violence:   . Fear of Current or Ex-Partner: Not on file  . Emotionally Abused: Not on file  . Physically Abused: Not on file  . Sexually Abused: Not on file    Outpatient Medications Prior to Visit  Medication Sig Dispense Refill  . albuterol (VENTOLIN HFA) 108 (90 Base) MCG/ACT inhaler Inhale into the lungs.    Marland Kitchen alendronate (FOSAMAX) 70 MG tablet Take 1 tablet by mouth once a week.    Marland Kitchen atorvastatin (LIPITOR) 40 MG tablet TAKE 1 TABLET BY MOUTH  DAILY 90 tablet 3  . Budeson-Glycopyrrol-Formoterol (BREZTRI AEROSPHERE) 160-9-4.8 MCG/ACT AERO Inhale 2 puffs into the lungs in the morning and at bedtime. 32.1 g 1  . busPIRone (BUSPAR) 30 MG tablet Take 1 tablet (30 mg total) by mouth 2 (two) times daily. 180 tablet 1  . clonazePAM (KLONOPIN) 0.5 MG tablet TAKE 1 TABLET BY MOUTH TWICE A DAY AS NEEDED FOR ANXIETY 60 tablet 1  . cyclobenzaprine (FLEXERIL) 10 MG tablet Take 10 mg by mouth 3 (three) times daily.    . diclofenac sodium (VOLTAREN) 1 % GEL Apply 1 application topically daily as needed.    . DULoxetine (CYMBALTA) 60 MG capsule Take 1 capsule by mouth 2 (two) times daily.    Marland Kitchen DYMISTA 137-50 MCG/ACT SUSP One spray each nostril twice a day. 23 g 3  . fenofibrate 160 MG tablet Take 1 tablet (160 mg total) by mouth daily. 90 tablet 1  . hydrocortisone (CORTEF) 20 MG tablet Take 1 tablet in the morning and 1/2 tablet at 4pm    . levocetirizine (XYZAL) 5 MG tablet Take 1 tablet (5 mg total) by mouth every evening. 30 tablet 6  . levothyroxine (SYNTHROID, LEVOTHROID) 50 MCG tablet Take 1 tablet by mouth every morning.    . lidocaine (LIDODERM) 5 % Place 1 patch onto the skin daily as needed. (12 hours on and 12 hours off) 30 patch 5  . lubiprostone (AMITIZA) 8 MCG capsule Take 1 capsule (8 mcg total) by mouth 2 (two) times daily with a  meal. 60 capsule 1  . montelukast (SINGULAIR) 10 MG tablet Take 1 tablet (10 mg total) by mouth daily. 90 tablet 3  . Multiple Vitamin (MULTI-VITAMINS) TABS Take 1 tablet by mouth daily.    . naloxone (NARCAN) nasal spray 4 mg/0.1 mL Place 1 spray into the nose once. (for suspected overdose)    . olopatadine (PATANOL) 0.1 % ophthalmic solution Use 1 drop in each eye twice daily as needed 5 mL 5  .  omeprazole (PRILOSEC) 40 MG capsule Take 1 capsule (40 mg total) by mouth 2 (two) times daily. 180 capsule 3  . oxyCODONE-acetaminophen (PERCOCET) 10-325 MG tablet TAKE 1 TAB EVERY 6 HOURS FOR 5 DAYS THEN 1 TAB TWICE A DAY AS NEEDED FOR SEVERE BREAKTHROUGH PAIN  0  . primidone (MYSOLINE) 50 MG tablet Take 1 tablet (50 mg total) by mouth 2 (two) times daily. 120 tablet 1  . Tiotropium Bromide Monohydrate (SPIRIVA RESPIMAT) 2.5 MCG/ACT AERS Inhale into the lungs.    . traZODone (DESYREL) 100 MG tablet Take 1 tablet (100 mg total) by mouth at bedtime as needed for sleep (take 1 - 2 tablets at bedtime as needed). Take 1-2 tablets at bedtime 60 tablet 0  . Vitamin D, Ergocalciferol, (DRISDOL) 50000 units CAPS capsule Take 1 capsule by mouth once a week.    Marland Kitchen ZTLIDO 1.8 % PTCH SMARTSIG:1 Patch(s) Topical As Needed    . gabapentin (NEURONTIN) 600 MG tablet Take 1 tablet by mouth 4 (four) times daily.     No facility-administered medications prior to visit.    Allergies  Allergen Reactions  . Celebrex [Celecoxib]     "because of stomach ulcers"  . Chantix [Varenicline]   . Fish Oil Other (See Comments)    hemorrhoids  . Nsaids Other (See Comments)    GI Upset. Ulcers  . Other     beets  . Nicotrol [Nicotine] Nausea Only  . Penicillins Rash    Review of Systems  Constitutional: Positive for fatigue. Negative for fever.  HENT: Negative for congestion, ear pain, sinus pressure and sore throat.   Eyes: Negative for pain.  Respiratory: Positive for cough (chronic). Negative for chest tightness,  shortness of breath and wheezing.   Cardiovascular: Negative for chest pain and palpitations.  Gastrointestinal: Negative for abdominal pain, constipation, diarrhea, nausea and vomiting.  Genitourinary: Negative for dysuria and hematuria.  Musculoskeletal: Positive for arthralgias (right foot, back), back pain (chronic ) and myalgias (fibromyalgia).       Right foot pain  Skin: Negative for rash.  Neurological: Negative for dizziness, weakness and headaches.  Psychiatric/Behavioral: Negative for dysphoric mood. The patient is not nervous/anxious.        Objective:    Physical Exam Vitals reviewed.  Constitutional:      Appearance: Normal appearance.  HENT:     Head: Normocephalic.     Nose: Nose normal.  Cardiovascular:     Rate and Rhythm: Normal rate and regular rhythm.     Pulses: Normal pulses.     Heart sounds: Normal heart sounds.  Pulmonary:     Effort: Pulmonary effort is normal.     Comments: Diminished lung sounds in all fields Abdominal:     General: Bowel sounds are normal.     Palpations: Abdomen is soft.  Musculoskeletal:        General: Swelling and tenderness present.     Cervical back: Normal range of motion.     Right foot: Decreased range of motion. Normal capillary refill. Swelling, tenderness and bony tenderness present. Normal pulse.     Left foot: Normal.       Legs:     Comments: Right foot swelling and tenderness  Skin:    General: Skin is warm and dry.     Capillary Refill: Capillary refill takes less than 2 seconds.     Findings: Erythema (right foot) present.  Neurological:     General: No focal deficit present.  Mental Status: She is alert and oriented to person, place, and time.  Psychiatric:        Mood and Affect: Mood normal.        Behavior: Behavior normal.        Thought Content: Thought content normal.        Judgment: Judgment normal.     BP 124/68 (BP Location: Left Arm, Patient Position: Sitting)   Pulse 86   Temp (!)  97.5 F (36.4 C) (Temporal)   Ht 5\' 4"  (1.626 m)   Wt 139 lb (63 kg)   SpO2 95%   BMI 23.86 kg/m  Wt Readings from Last 3 Encounters:  04/20/20 139 lb (63 kg)  03/03/20 133 lb (60.3 kg)  02/16/20 130 lb (59 kg)    Health Maintenance Due  Topic Date Due  . Hepatitis C Screening  Never done  . HIV Screening  Never done  . PAP SMEAR-Modifier  Never done  . PNA vac Low Risk Adult (2 of 2 - PPSV23) 05/05/2019  . COVID-19 Vaccine (2 - Pfizer 2-dose series) 02/20/2020       Lab Results  Component Value Date   TSH 0.902 01/15/2020   Lab Results  Component Value Date   WBC 13.4 (H) 01/15/2020   HGB 14.4 01/15/2020   HCT 43.5 01/15/2020   MCV 94 01/15/2020   PLT 435 01/15/2020   Lab Results  Component Value Date   NA 144 01/15/2020   K 3.5 01/15/2020   CO2 27 01/15/2020   GLUCOSE 81 01/15/2020   BUN 7 (L) 01/15/2020   CREATININE 0.77 01/15/2020   BILITOT 0.3 01/15/2020   ALKPHOS 36 (L) 01/15/2020   AST 15 01/15/2020   ALT 14 01/15/2020   PROT 6.2 01/15/2020   ALBUMIN 4.4 01/15/2020   CALCIUM 8.9 01/15/2020   Lab Results  Component Value Date   CHOL 182 01/15/2020   Lab Results  Component Value Date   HDL 84 01/15/2020   Lab Results  Component Value Date   LDLCALC 78 01/15/2020   Lab Results  Component Value Date   TRIG 118 01/15/2020   Lab Results  Component Value Date   CHOLHDL 2.2 01/15/2020          Assessment & Plan:   1. Stress fracture, right foot, initial encounter for fracture -Right foot x-[facility] at [facility] today  2. Right foot pain -Rest, Ice, Elevate right foot -Limit weight bearing   3. History of osteoporosis -Continue Evenity  4. Chronic pain syndrome -Continue Percocet 10/325 BID as prescribed for chronic pain management  5. Sciatica of right side associated with disorder of lumbar spine -Continue to use can to assist with ambulation  6. Chronic bronchitis, unspecified chronic bronchitis type ([place]) -Continue  Breztri and albuterol inhalers  7. Cigarette smoker -consider smoking cessation  Obtain right foot x-ray at [facility] today Rest, ice, elevate right foot  Continue prescribed pain medicine Keep follow-up appointment with Dr Tobie Poet 04/26/20 at 10:00-fasting  Follow-up: Dr Tobie Poet 04/26/20 at 10:00 as scheduled   [name], [place], [place]

## 2020-04-23 ENCOUNTER — Encounter: Payer: Self-pay | Admitting: Nurse Practitioner

## 2020-04-26 ENCOUNTER — Other Ambulatory Visit: Payer: Self-pay | Admitting: Legal Medicine

## 2020-04-26 ENCOUNTER — Ambulatory Visit (INDEPENDENT_AMBULATORY_CARE_PROVIDER_SITE_OTHER): Payer: Medicare Other | Admitting: Family Medicine

## 2020-04-26 ENCOUNTER — Other Ambulatory Visit: Payer: Self-pay

## 2020-04-26 ENCOUNTER — Encounter: Payer: Self-pay | Admitting: Family Medicine

## 2020-04-26 VITALS — BP 150/84 | HR 84 | Temp 97.0°F | Resp 16 | Ht 64.0 in | Wt 137.0 lb

## 2020-04-26 DIAGNOSIS — E213 Hyperparathyroidism, unspecified: Secondary | ICD-10-CM | POA: Diagnosis not present

## 2020-04-26 DIAGNOSIS — E039 Hypothyroidism, unspecified: Secondary | ICD-10-CM | POA: Diagnosis not present

## 2020-04-26 DIAGNOSIS — E782 Mixed hyperlipidemia: Secondary | ICD-10-CM | POA: Diagnosis not present

## 2020-04-26 DIAGNOSIS — E271 Primary adrenocortical insufficiency: Secondary | ICD-10-CM

## 2020-04-26 DIAGNOSIS — S92901G Unspecified fracture of right foot, subsequent encounter for fracture with delayed healing: Secondary | ICD-10-CM

## 2020-04-26 DIAGNOSIS — J432 Centrilobular emphysema: Secondary | ICD-10-CM

## 2020-04-26 NOTE — Progress Notes (Signed)
Subjective:  Patient ID: Connie Coffey, female    DOB: [birth year]  Age: 64 y.o. MRN: [id number]  Chief Complaint  Patient presents with  . COPD  . Hyperlipidemia   HPI  Eating healthy for the most part. Working on her home.    Patient presents with atrophy of thyroid (acquired).  On synthroid 50 mcg once dailyl. Thyroid was therapeutic 01/2020.     Pt presents with hyperlipidemia. Currenlty on Lipitor and Tricor.    [name] presents with a diagnosis of impaired fasting glucose.  Eating healthy.     Additionally, she presents with history of simple chronic bronchitis. Complaining of deep cough and was seen by pulmonology 2 weeks ago who gave her an antibiotic. No steroids. Taking spiriva one inhalation twice a day and albuterol hfa.  Smoking 1 ppd.    Patient has depression and anxiety. She is currently on trazodone, duloxetine, buspar, and clonazepam.   Patient has osteoporosis. She is taking fosamax once weekly. 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 Taking calcium with vtimain D. Rx Vitamn D.   Chronic pain Clinic -for back. Percocet/gabapentin/flexeril.   Patient has adrenal insufficiency and is currently on cortef 20 mg once daily.  Pt sees Peri Jefferson, NP.  Rt foot pain - seen last week by Larene Beach NP here in the office and was sent for xray which came back showing an avulsion fracture.   Current Outpatient Medications on File Prior to Visit  Medication Sig Dispense Refill  . albuterol (VENTOLIN HFA) 108 (90 Base) MCG/ACT inhaler Inhale into the lungs.    Marland Kitchen alendronate (FOSAMAX) 70 MG tablet Take 1 tablet by mouth once a week.    Marland Kitchen atorvastatin (LIPITOR) 40 MG tablet TAKE 1 TABLET BY MOUTH  DAILY 90 tablet 3  . busPIRone (BUSPAR) 30 MG tablet Take 1 tablet (30 mg total) by mouth 2 (two) times daily. 180 tablet 1  . clonazePAM (KLONOPIN) 0.5 MG tablet TAKE 1 TABLET BY MOUTH TWICE A DAY AS NEEDED FOR ANXIETY 60 tablet 1  . cyclobenzaprine (FLEXERIL) 10 MG tablet Take 10 mg by mouth  3 (three) times daily.    . diclofenac sodium (VOLTAREN) 1 % GEL Apply 1 application topically daily as needed.    . DULoxetine (CYMBALTA) 60 MG capsule Take 1 capsule by mouth 2 (two) times daily.    Marland Kitchen DYMISTA 137-50 MCG/ACT SUSP One spray each nostril twice a day. 23 g 3  . fenofibrate 160 MG tablet Take 1 tablet (160 mg total) by mouth daily. 90 tablet 1  . gabapentin (NEURONTIN) 600 MG tablet Take 1 tablet by mouth 4 (four) times daily.    . hydrocortisone (CORTEF) 20 MG tablet Take 1 tablet in the morning and 1/2 tablet at 4pm    . levothyroxine (SYNTHROID, LEVOTHROID) 50 MCG tablet Take 1 tablet by mouth every morning.    . lidocaine (LIDODERM) 5 % Place 1 patch onto the skin daily as needed. (12 hours on and 12 hours off) 30 patch 5  . lubiprostone (AMITIZA) 8 MCG capsule Take 1 capsule (8 mcg total) by mouth 2 (two) times daily with a meal. 60 capsule 1  . montelukast (SINGULAIR) 10 MG tablet Take 1 tablet (10 mg total) by mouth daily. 90 tablet 3  . Multiple Vitamin (MULTI-VITAMINS) TABS Take 1 tablet by mouth daily.    . naloxone (NARCAN) nasal spray 4 mg/0.1 mL Place 1 spray into the nose once. (for suspected overdose)    . olopatadine (PATANOL) 0.1 %  ophthalmic solution Use 1 drop in each eye twice daily as needed 5 mL 5  . omeprazole (PRILOSEC) 40 MG capsule Take 1 capsule (40 mg total) by mouth 2 (two) times daily. 180 capsule 3  . oxyCODONE-acetaminophen (PERCOCET) 10-325 MG tablet TAKE 1 TAB EVERY 6 HOURS FOR 5 DAYS THEN 1 TAB TWICE A DAY AS NEEDED FOR SEVERE BREAKTHROUGH PAIN  0  . primidone (MYSOLINE) 50 MG tablet Take 1 tablet (50 mg total) by mouth 2 (two) times daily. 120 tablet 1  . Tiotropium Bromide Monohydrate (SPIRIVA RESPIMAT) 2.5 MCG/ACT AERS Inhale into the lungs.    . traZODone (DESYREL) 100 MG tablet Take 1 tablet (100 mg total) by mouth at bedtime as needed for sleep (take 1 - 2 tablets at bedtime as needed). Take 1-2 tablets at bedtime 60 tablet 0  . Vitamin D,  Ergocalciferol, (DRISDOL) 50000 units CAPS capsule Take 1 capsule by mouth once a week.    Marland Kitchen ZTLIDO 1.8 % PTCH SMARTSIG:1 Patch(s) Topical As Needed     No current facility-administered medications on file prior to visit.   Past Medical History:  Diagnosis Date  . Adrenal insufficiency ([place])   . Arthritis   . Carpal tunnel syndrome    Both hands, worse in right  . Chronic constipation   . COPD (chronic obstructive pulmonary disease) ([place])   . CVA (cerebral vascular accident) ([place])   . Facet syndrome   . Fibromyalgia   . GERD (gastroesophageal reflux disease)   . Heart murmur   . Hypercholesteremia   . IBS (irritable bowel syndrome)   . Ischemic stroke ([place])    07/19/2004  . Lumbar radiculopathy   . Osteoporosis   . RLS (restless legs syndrome)   . Spinal stenosis   . TMJ (temporomandibular joint disorder)   . Trochanteric bursitis of both hips   . Vascular malformation    spinal cord   Past Surgical History:  Procedure Laterality Date  . APPENDECTOMY    . CARPAL TUNNEL RELEASE Right 12/13/2017   surgery  . CHOLECYSTECTOMY    . COLONOSCOPY  05/06/2017   Colonic polyps status post polypectomy. Interan land external hemorrhoids.   . ESOPHAGOGASTRODUODENOSCOPY  04/28/2007   Mild gastrtiis. Otherwise normal EGD.   Marland Kitchen HEMORROIDECTOMY    . LAMINECTOMY AND MICRODISCECTOMY LUMBAR SPINE  01/03/2005  . NECK SURGERY     x2   . OTHER SURGICAL HISTORY  04/24/2004   anterior cervical discectomy and fusion at C5-C6  . TONSILLECTOMY    . TOTAL ABDOMINAL HYSTERECTOMY      Family History  Problem Relation Age of Onset  . Supraventricular tachycardia Mother   . Dementia Mother   . Hyperlipidemia Mother   . Hypertension Mother   . Osteoarthritis Mother   . Osteoporosis Mother   . Breast cancer Other   . Breast cancer Other   . Colon cancer Neg Hx   . Esophageal cancer Neg Hx    Social History   Socioeconomic History  . Marital status: Divorced    Spouse name: Not on file    . Number of children: 1  . Years of education: Not on file  . Highest education level: Not on file  Occupational History  . Occupation: Disabled  Tobacco Use  . Smoking status: Current Every Day Smoker    Packs/day: 1.50    Types: Cigarettes  . Smokeless tobacco: Never Used  Vaping Use  . Vaping Use: Never used  Substance and Sexual Activity  .  Alcohol use: Not Currently    Comment: quit 19 years  . Drug use: Never  . Sexual activity: Not on file  Other Topics Concern  . Not on file  Social History Narrative  . Not on file   Social Determinants of Health   Financial Resource Strain:   . Difficulty of Paying Living Expenses: Not on file  Food Insecurity: No Food Insecurity  . Worried About Charity fundraiser in the Last Year: Never true  . Ran Out of Food in the Last Year: Never true  Transportation Needs: No Transportation Needs  . Lack of Transportation (Medical): No  . Lack of Transportation (Non-Medical): No  Physical Activity:   . Days of Exercise per Week: Not on file  . Minutes of Exercise per Session: Not on file  Stress: Stress Concern Present  . Feeling of Stress : Very much  Social Connections:   . Frequency of Communication with Friends and Family: Not on file  . Frequency of Social Gatherings with Friends and Family: Not on file  . Attends Religious Services: Not on file  . Active Member of Clubs or Organizations: Not on file  . Attends Archivist Meetings: Not on file  . Marital Status: Not on file    Review of Systems  Constitutional: Positive for fatigue. Negative for chills and fever.  HENT: Positive for congestion and rhinorrhea. Negative for sore throat.   Eyes: Positive for visual disturbance (problems when reading).  Respiratory: Positive for cough and shortness of breath.   Cardiovascular: Negative for chest pain and palpitations.  Gastrointestinal: Negative for abdominal pain, constipation, diarrhea, nausea and vomiting.   Endocrine: Negative for polydipsia and polyphagia.  Genitourinary: Negative for dysuria and urgency.  Musculoskeletal: Positive for arthralgias, back pain and myalgias.  Neurological: Positive for weakness. Negative for dizziness, light-headedness and headaches.  Psychiatric/Behavioral: Negative for dysphoric mood. The patient is nervous/anxious.      Objective:  BP (!) 150/84   Pulse 84   Temp (!) 97 F (36.1 C)   Resp 16   Ht 5\' 4"  (1.626 m)   Wt 137 lb (62.1 kg)   BMI 23.52 kg/m   BP/Weight 04/26/2020 04/20/2020 44/0/3474  Systolic BP 259 563 875  Diastolic BP 84 68 64  Wt. (Lbs) 137 139 133  BMI 23.52 23.86 22.83    Physical Exam Vitals reviewed.  Constitutional:      Appearance: Normal appearance. She is normal weight.  Neck:     Vascular: No carotid bruit.  Cardiovascular:     Rate and Rhythm: Normal rate and regular rhythm.     Pulses: Normal pulses.     Heart sounds: Normal heart sounds.  Pulmonary:     Effort: Pulmonary effort is normal. No respiratory distress.     Breath sounds: Normal breath sounds.  Abdominal:     General: Abdomen is flat. Bowel sounds are normal.     Palpations: Abdomen is soft.     Tenderness: There is no abdominal tenderness.  Neurological:     Mental Status: She is alert and oriented to person, place, and time.  Psychiatric:        Mood and Affect: Mood normal.        Behavior: Behavior normal.    Lab Results  Component Value Date   WBC 13.4 (H) 01/15/2020   HGB 14.4 01/15/2020   HCT 43.5 01/15/2020   PLT 435 01/15/2020   GLUCOSE 81 01/15/2020   CHOL 182 01/15/2020  TRIG 118 01/15/2020   HDL 84 01/15/2020   LDLCALC 78 01/15/2020   ALT 14 01/15/2020   AST 15 01/15/2020   NA 144 01/15/2020   K 3.5 01/15/2020   CL 103 01/15/2020   CREATININE 0.77 01/15/2020   BUN 7 (L) 01/15/2020   CO2 27 01/15/2020   TSH 0.902 01/15/2020      Assessment & Plan:   1. Centrilobular emphysema ([place]) The current medical regimen  is effective;  continue present plan and medications.  2. Acquired hypothyroidism The current medical regimen is effective;  continue present plan and medications.  3. Hyperparathyroidism ([place]) Stable.  4. Adrenal insufficiency (Addison's disease) ([place]) .The current medical regimen is effective;  continue present plan and medications. Management per specialist.   5. Mixed hyperlipidemia Well controlled.  No changes to medicines.  Continue to work on eating a healthy diet and exercise.  Labs drawn today.  - CBC with Differential/Platelet - Comprehensive metabolic panel - Lipid panel  6. Closed fracture of right foot with delayed healing, subsequent encounter - Ambulatory referral to Podiatry   7. Depression, mild, recurrent The current medical regimen is effective;  continue present plan and medications.  8. Osteoporosis - prolia approval pending.   Orders Placed This Encounter  Procedures  . CBC with Differential/Platelet  . Comprehensive metabolic panel  . Lipid panel  . Ambulatory referral to Podiatry    I spent 30 minutes dedicated to the care of this patient on the date of this encounter to include face-to-face time with the patient, as well as: reviewing labs.   Follow-up: No follow-ups on file.  An After Visit Summary was printed and given to the patient.  [name], MD [facility] [phone]

## 2020-04-27 LAB — COMPREHENSIVE METABOLIC PANEL
ALT: 21 IU/L (ref 0–32)
AST: 17 IU/L (ref 0–40)
Albumin/Globulin Ratio: 2.3 — ABNORMAL HIGH (ref 1.2–2.2)
Albumin: 4.3 g/dL (ref 3.8–4.8)
Alkaline Phosphatase: 38 IU/L — ABNORMAL LOW (ref 44–121)
BUN/Creatinine Ratio: 15 (ref 12–28)
BUN: 12 mg/dL (ref 8–27)
Bilirubin Total: 0.3 mg/dL (ref 0.0–1.2)
CO2: 28 mmol/L (ref 20–29)
Calcium: 9.9 mg/dL (ref 8.7–10.3)
Chloride: 102 mmol/L (ref 96–106)
Creatinine, Ser: 0.79 mg/dL (ref 0.57–1.00)
GFR calc Af Amer: 91 mL/min/{1.73_m2} (ref >59)
GFR calc non Af Amer: 79 mL/min/{1.73_m2} (ref >59)
Globulin, Total: 1.9 g/dL (ref 1.5–4.5)
Glucose: 91 mg/dL (ref 65–99)
Potassium: 4.4 mmol/L (ref 3.5–5.2)
Sodium: 143 mmol/L (ref 134–144)
Total Protein: 6.2 g/dL (ref 6.0–8.5)

## 2020-04-27 LAB — CBC WITH DIFFERENTIAL/PLATELET
Basophils Absolute: 0.1 10*3/uL (ref 0.0–0.2)
Basos: 1 %
EOS (ABSOLUTE): 0.1 10*3/uL (ref 0.0–0.4)
Eos: 0 %
Hematocrit: 42.9 % (ref 34.0–46.6)
Hemoglobin: 14.5 g/dL (ref 11.1–15.9)
Immature Grans (Abs): 0.1 10*3/uL (ref 0.0–0.1)
Immature Granulocytes: 1 %
Lymphocytes Absolute: 1.5 10*3/uL (ref 0.7–3.1)
Lymphs: 13 %
MCH: 30.7 pg (ref 26.6–33.0)
MCHC: 33.8 g/dL (ref 31.5–35.7)
MCV: 91 fL (ref 79–97)
Monocytes Absolute: 0.6 10*3/uL (ref 0.1–0.9)
Monocytes: 5 %
Neutrophils Absolute: 9.2 10*3/uL — ABNORMAL HIGH (ref 1.4–7.0)
Neutrophils: 80 %
Platelets: 411 10*3/uL (ref 150–450)
RBC: 4.73 x10E6/uL (ref 3.77–5.28)
RDW: 12.8 % (ref 11.7–15.4)
WBC: 11.6 10*3/uL — ABNORMAL HIGH (ref 3.4–10.8)

## 2020-04-27 LAB — LIPID PANEL
Chol/HDL Ratio: 2.1 ratio (ref 0.0–4.4)
Cholesterol, Total: 194 mg/dL (ref 100–199)
HDL: 92 mg/dL (ref >39)
LDL Chol Calc (NIH): 81 mg/dL (ref 0–99)
Triglycerides: 122 mg/dL (ref 0–149)
VLDL Cholesterol Cal: 21 mg/dL (ref 5–40)

## 2020-04-27 LAB — CARDIOVASCULAR RISK ASSESSMENT

## 2020-04-28 ENCOUNTER — Ambulatory Visit (INDEPENDENT_AMBULATORY_CARE_PROVIDER_SITE_OTHER): Payer: Medicare Other

## 2020-04-28 ENCOUNTER — Other Ambulatory Visit: Payer: Self-pay

## 2020-04-28 DIAGNOSIS — Z23 Encounter for immunization: Secondary | ICD-10-CM | POA: Diagnosis not present

## 2020-04-28 NOTE — Progress Notes (Signed)
   Covid-19 Vaccination Clinic  Name:  Connie Coffey    MRN: [id number] DOB: 1955/09/26  04/28/2020  Connie Coffey was observed post Covid-19 immunization for 15 minutes without incident. She was provided with Vaccine Information Sheet and instruction to access the V-Safe system.   Connie Coffey was instructed to call 911 with any severe reactions post vaccine: Marland Kitchen Difficulty breathing  . Swelling of face and throat  . A fast heartbeat  . A bad rash all over body  . Dizziness and weakness   Immunizations Administered    Name Date Dose VIS Date Route   Pfizer COVID-19 Vaccine 04/28/2020 10:00 AM 0.3 mL 03/16/2020 Intramuscular   Manufacturer: [place]   Lot: [id number]   [place]: [address]-5

## 2020-05-11 ENCOUNTER — Ambulatory Visit: Payer: Medicare Other | Admitting: Sports Medicine

## 2020-05-11 ENCOUNTER — Encounter: Payer: Self-pay | Admitting: Sports Medicine

## 2020-05-11 ENCOUNTER — Other Ambulatory Visit: Payer: Self-pay | Admitting: Sports Medicine

## 2020-05-11 ENCOUNTER — Ambulatory Visit: Payer: Self-pay

## 2020-05-11 ENCOUNTER — Other Ambulatory Visit: Payer: Self-pay

## 2020-05-11 DIAGNOSIS — M5386 Other specified dorsopathies, lumbar region: Secondary | ICD-10-CM

## 2020-05-11 DIAGNOSIS — M79671 Pain in right foot: Secondary | ICD-10-CM | POA: Diagnosis not present

## 2020-05-11 DIAGNOSIS — M5440 Lumbago with sciatica, unspecified side: Secondary | ICD-10-CM

## 2020-05-11 DIAGNOSIS — S92901G Unspecified fracture of right foot, subsequent encounter for fracture with delayed healing: Secondary | ICD-10-CM

## 2020-05-11 DIAGNOSIS — S92211A Displaced fracture of cuboid bone of right foot, initial encounter for closed fracture: Secondary | ICD-10-CM | POA: Diagnosis not present

## 2020-05-11 DIAGNOSIS — M792 Neuralgia and neuritis, unspecified: Secondary | ICD-10-CM

## 2020-05-11 DIAGNOSIS — M19071 Primary osteoarthritis, right ankle and foot: Secondary | ICD-10-CM

## 2020-05-11 DIAGNOSIS — M779 Enthesopathy, unspecified: Secondary | ICD-10-CM

## 2020-05-11 NOTE — Progress Notes (Signed)
Subjective: Connie Coffey is a 64 y.o. female patient who presents to office for evaluation of Right foot pain. Patient reports that she started having mid arch the first Sunday in November when she got her foot caught and fell over it reports that she went to the PCP the and had x-rays and that noticed with a fracture. Patient reports pain is occasional 5 out of 10 but the worst pain is sharp shooting pain to the foot with numbness and tingling that is severe along the area where the fracture is. Patient reports she has been icing elevating and using the cane.  No other pedal complaints noted.  Review of system noncontributory  Patient Active Problem List   Diagnosis Date Noted  . Simple chronic bronchitis ([place]) 02/16/2020  . Acute sinusitis 12/29/2019  . Tick bite of left side of chest wall 11/10/2019  . Mixed hyperlipidemia 10/19/2019  . Adrenal insufficiency (Addison's disease) ([place]) 10/19/2019  . Obstructive chronic bronchitis with exacerbation ([place]) 09/08/2019  . Sciatica of right side associated with disorder of lumbar spine 07/18/2019  . Acute serous otitis media of left ear 07/18/2019  . History of colonic polyps 02/16/2019  . History of hyperparathyroidism 12/22/2018  . Lung nodules 07/18/2018  . Centrilobular emphysema ([place]) 03/11/2018  . Cigarette smoker 03/11/2018  . Chronic pain syndrome 04/15/2017  . Depression with anxiety 03/21/2017  . Age-related osteoporosis without current pathological fracture 01/18/2017  . Acquired hypothyroidism 09/22/2015  . Hyperparathyroidism ([place]) 09/22/2015  . Vitamin D deficiency 09/22/2015  . Anxiety state 08/06/2013  . Left carpal tunnel syndrome 06/04/2013  . Neck pain 01/21/2013  . Low back pain 11/21/2012  . Chronic airway obstruction ([place]) 11/01/2012    Current Outpatient Medications on File Prior to Visit  Medication Sig Dispense Refill  . albuterol (VENTOLIN HFA) 108 (90 Base) MCG/ACT inhaler Inhale into the lungs.    Marland Kitchen  alendronate (FOSAMAX) 70 MG tablet Take 1 tablet by mouth once a week.    Marland Kitchen atorvastatin (LIPITOR) 40 MG tablet TAKE 1 TABLET BY MOUTH  DAILY 90 tablet 3  . busPIRone (BUSPAR) 30 MG tablet Take 1 tablet (30 mg total) by mouth 2 (two) times daily. 180 tablet 1  . clonazePAM (KLONOPIN) 0.5 MG tablet TAKE 1 TABLET BY MOUTH TWICE A DAY AS NEEDED FOR ANXIETY 60 tablet 1  . cyclobenzaprine (FLEXERIL) 10 MG tablet Take 10 mg by mouth 3 (three) times daily.    . diclofenac sodium (VOLTAREN) 1 % GEL Apply 1 application topically daily as needed.    . DULoxetine (CYMBALTA) 60 MG capsule Take 1 capsule by mouth 2 (two) times daily.    Marland Kitchen DYMISTA 137-50 MCG/ACT SUSP One spray each nostril twice a day. 23 g 3  . fenofibrate 160 MG tablet Take 1 tablet (160 mg total) by mouth daily. 90 tablet 1  . gabapentin (NEURONTIN) 600 MG tablet Take 1 tablet by mouth 4 (four) times daily.    . hydrocortisone (CORTEF) 20 MG tablet Take 1 tablet in the morning and 1/2 tablet at 4pm    . levocetirizine (XYZAL) 5 MG tablet TAKE 1 TABLET BY MOUTH EVERY DAY IN THE EVENING 90 tablet 2  . levothyroxine (SYNTHROID, LEVOTHROID) 50 MCG tablet Take 1 tablet by mouth every morning.    . lidocaine (LIDODERM) 5 % Place 1 patch onto the skin daily as needed. (12 hours on and 12 hours off) 30 patch 5  . lubiprostone (AMITIZA) 8 MCG capsule Take 1 capsule (8 mcg  total) by mouth 2 (two) times daily with a meal. 60 capsule 1  . montelukast (SINGULAIR) 10 MG tablet Take 1 tablet (10 mg total) by mouth daily. 90 tablet 3  . Multiple Vitamin (MULTI-VITAMINS) TABS Take 1 tablet by mouth daily.    . naloxone (NARCAN) nasal spray 4 mg/0.1 mL Place 1 spray into the nose once. (for suspected overdose)    . olopatadine (PATANOL) 0.1 % ophthalmic solution Use 1 drop in each eye twice daily as needed 5 mL 5  . omeprazole (PRILOSEC) 40 MG capsule Take 1 capsule (40 mg total) by mouth 2 (two) times daily. 180 capsule 3  . oxyCODONE-acetaminophen  (PERCOCET) 10-325 MG tablet TAKE 1 TAB EVERY 6 HOURS FOR 5 DAYS THEN 1 TAB TWICE A DAY AS NEEDED FOR SEVERE BREAKTHROUGH PAIN  0  . primidone (MYSOLINE) 50 MG tablet Take 1 tablet (50 mg total) by mouth 2 (two) times daily. 120 tablet 1  . Tiotropium Bromide Monohydrate (SPIRIVA RESPIMAT) 2.5 MCG/ACT AERS Inhale into the lungs.    . traZODone (DESYREL) 100 MG tablet Take 1 tablet (100 mg total) by mouth at bedtime as needed for sleep (take 1 - 2 tablets at bedtime as needed). Take 1-2 tablets at bedtime 60 tablet 0  . Vitamin D, Ergocalciferol, (DRISDOL) 50000 units CAPS capsule Take 1 capsule by mouth once a week.    Marland Kitchen ZTLIDO 1.8 % PTCH SMARTSIG:1 Patch(s) Topical As Needed     No current facility-administered medications on file prior to visit.    Allergies  Allergen Reactions  . Celebrex [Celecoxib]     "because of stomach ulcers"  . Chantix [Varenicline]   . Fish Oil Other (See Comments)    hemorrhoids  . Nsaids Other (See Comments)    GI Upset. Ulcers  . Other     beets  . Nicotrol [Nicotine] Nausea Only  . Penicillins Rash    Objective:  General: Alert and oriented x3 in no acute distress  Dermatology: No open lesions bilateral lower extremities, no webspace macerations, no ecchymosis bilateral, all nails x 10 are well manicured.  Vascular: Minimal edema noted to the right foot PT and DP pedal pulses 1 out of 4 bilateral.  Capillary Fill Time 5 seconds, scant pedal hair growth bilateral, Temperature gradient within normal limits.  Neurology: Gross sensation intact via light touch bilateral, subjective sharp shooting pains to the right foot history of sciatica.  Musculoskeletal: There is tenderness to palpation at tendon course worse at the fifth metatarsal base and cuboid joint.  There is mild guarding with range of motion noted on the right.  Strength 4 out of 5 on right 5 out of 5 on left.  Likely decrease in strength due to pain.  Cane assisted gait.  Xrays  Right  foot   Impression: Incomplete fracture with some arthritis noted at the fifth metatarsal base and calcaneal cuboid joint.  This fracture appears to be extra-articular and is partial/incomplete only seen most clearly on the oblique view.  There is mild soft tissue swelling.  No other acute findings noted.    Assessment and Plan: Problem List Items Addressed This Visit      Nervous and Auditory   Sciatica of right side associated with disorder of lumbar spine     Other   Low back pain    Other Visit Diagnoses    Closed displaced fracture of cuboid of right foot, initial encounter    -  Primary   Right foot  pain       Neuritis       Tendinitis       Arthritis of right foot          -Complete examination performed -Xrays reviewed -Discussed treatement options for fracture; risks, alternatives, and benefits explained. -Applied ankle gauntlet for patient to use to keep pressure off the peroneal tendon course and the lateral foot at area of fracture -Due to patient low back pain sciatic issues patient is not a candidate for a boot or surgical shoe -Recommend protection, rest, ice, elevation daily until symptoms improve -Continue with over-the-counter pain medicines as needed -Continue with nerve medicines of gabapentin and Cymbalta as previous prescribed -Continue with cane to provide additional stability in gait or walker -Patient to return to office in 4 to 6 weeks for serial x-rays to assess healing  or sooner if condition worsens.  Landis Martins, DPM

## 2020-05-23 ENCOUNTER — Other Ambulatory Visit: Payer: Self-pay | Admitting: Family Medicine

## 2020-05-28 ENCOUNTER — Other Ambulatory Visit: Payer: Self-pay | Admitting: Physician Assistant

## 2020-05-30 ENCOUNTER — Encounter: Payer: Self-pay | Admitting: Nurse Practitioner

## 2020-05-30 ENCOUNTER — Ambulatory Visit (INDEPENDENT_AMBULATORY_CARE_PROVIDER_SITE_OTHER): Payer: Medicare Other | Admitting: Nurse Practitioner

## 2020-05-30 VITALS — BP 132/64 | HR 88 | Temp 97.3°F | Resp 16 | Wt 143.0 lb

## 2020-05-30 DIAGNOSIS — N3001 Acute cystitis with hematuria: Secondary | ICD-10-CM

## 2020-05-30 LAB — POCT URINALYSIS DIPSTICK
Bilirubin, UA: NEGATIVE
Blood, UA: 3
Glucose, UA: NEGATIVE
Ketones, UA: NEGATIVE
Nitrite, UA: POSITIVE
Protein, UA: POSITIVE — AB
Spec Grav, UA: 1.02 (ref 1.010–1.025)
pH, UA: 6 (ref 5.0–8.0)

## 2020-05-30 MED ORDER — SULFAMETHOXAZOLE-TRIMETHOPRIM 800-160 MG PO TABS: 1.0000 | ORAL_TABLET | Freq: Two times a day (BID) | ORAL | 0 refills | Status: DC

## 2020-05-30 MED ORDER — PHENAZOPYRIDINE HCL 100 MG PO TABS: 100.0000 mg | ORAL_TABLET | Freq: Three times a day (TID) | ORAL | 0 refills | Status: DC | PRN

## 2020-05-30 NOTE — Progress Notes (Signed)
Acute Office Visit  Subjective:    Patient ID: Connie Coffey, female    DOB: 1955-09-23, 66 y.o.   MRN: [id number]  CC: Hematuria   HPI Patient is in today for hematuria, suprapubic abdominal pain, and dysuria. Onset of symptoms was 3-days ago. Treatment has included pushing fluids. She denies fever, chills, or flank pain.   Past Medical History:  Diagnosis Date  . Adrenal insufficiency ([facility])   . Arthritis   . Carpal tunnel syndrome    Both hands, worse in right  . Chronic constipation   . COPD (chronic obstructive pulmonary disease) ([facility])   . CVA (cerebral vascular accident) ([facility])   . Facet syndrome   . Fibromyalgia   . GERD (gastroesophageal reflux disease)   . Heart murmur   . Hypercholesteremia   . IBS (irritable bowel syndrome)   . Ischemic stroke ([facility])    07/19/2004  . Lumbar radiculopathy   . Osteoporosis   . RLS (restless legs syndrome)   . Spinal stenosis   . TMJ (temporomandibular joint disorder)   . Trochanteric bursitis of both hips   . Vascular malformation    spinal cord    Past Surgical History:  Procedure Laterality Date  . APPENDECTOMY    . CARPAL TUNNEL RELEASE Right 12/13/2017   surgery  . CHOLECYSTECTOMY    . COLONOSCOPY  05/06/2017   Colonic polyps status post polypectomy. Interan land external hemorrhoids.   . ESOPHAGOGASTRODUODENOSCOPY  04/28/2007   Mild gastrtiis. Otherwise normal EGD.   Marland Kitchen HEMORROIDECTOMY    . LAMINECTOMY AND MICRODISCECTOMY LUMBAR SPINE  01/03/2005  . NECK SURGERY     x2   . OTHER SURGICAL HISTORY  04/24/2004   anterior cervical discectomy and fusion at C5-C6  . TONSILLECTOMY    . TOTAL ABDOMINAL HYSTERECTOMY      Family History  Problem Relation Age of Onset  . Supraventricular tachycardia Mother   . Dementia Mother   . Hyperlipidemia Mother   . Hypertension Mother   . Osteoarthritis Mother   . Osteoporosis Mother   . Breast cancer Other   . Breast cancer Other   . Colon cancer Neg Hx   . Esophageal  cancer Neg Hx     Social History   Socioeconomic History  . Marital status: Divorced    Spouse name: Not on file  . Number of children: 1  . Years of education: Not on file  . Highest education level: Not on file  Occupational History  . Occupation: Disabled  Tobacco Use  . Smoking status: Current Every Day Smoker    Packs/day: 1.50    Types: Cigarettes  . Smokeless tobacco: Never Used  Vaping Use  . Vaping Use: Never used  Substance and Sexual Activity  . Alcohol use: Not Currently    Comment: quit 19 years  . Drug use: Never  . Sexual activity: Not on file  Other Topics Concern  . Not on file  Social History Narrative  . Not on file   Social Determinants of Health   Financial Resource Strain: Not on file  Food Insecurity: No Food Insecurity  . Worried About Programme researcher, broadcasting/film/video in the Last Year: Never true  . Ran Out of Food in the Last Year: Never true  Transportation Needs: No Transportation Needs  . Lack of Transportation (Medical): No  . Lack of Transportation (Non-Medical): No  Physical Activity: Not on file  Stress: Stress Concern Present  . Feeling of Stress : Very  much  Social Connections: Not on file  Intimate Partner Violence: Not on file    Outpatient Medications Prior to Visit  Medication Sig Dispense Refill  . albuterol (VENTOLIN HFA) 108 (90 Base) MCG/ACT inhaler Inhale into the lungs.    Marland Kitchen alendronate (FOSAMAX) 70 MG tablet Take 1 tablet by mouth once a week.    Marland Kitchen atorvastatin (LIPITOR) 40 MG tablet TAKE 1 TABLET BY MOUTH  DAILY 90 tablet 3  . busPIRone (BUSPAR) 30 MG tablet Take 1 tablet (30 mg total) by mouth 2 (two) times daily. 180 tablet 1  . clonazePAM (KLONOPIN) 0.5 MG tablet TAKE 1 TABLET BY MOUTH TWICE A DAY AS NEEDED FOR ANXIETY 60 tablet 1  . cyclobenzaprine (FLEXERIL) 10 MG tablet Take 10 mg by mouth 3 (three) times daily.    . diclofenac sodium (VOLTAREN) 1 % GEL Apply 1 application topically daily as needed.    . DULoxetine  (CYMBALTA) 60 MG capsule Take 1 capsule by mouth 2 (two) times daily.    Marland Kitchen DYMISTA 137-50 MCG/ACT SUSP One spray each nostril twice a day. 23 g 3  . fenofibrate 160 MG tablet Take 1 tablet (160 mg total) by mouth daily. 90 tablet 1  . gabapentin (NEURONTIN) 600 MG tablet Take 1 tablet by mouth 4 (four) times daily.    . hydrocortisone (CORTEF) 20 MG tablet Take 1 tablet in the morning and 1/2 tablet at 4pm    . levocetirizine (XYZAL) 5 MG tablet TAKE 1 TABLET BY MOUTH EVERY DAY IN THE EVENING 90 tablet 2  . levothyroxine (SYNTHROID, LEVOTHROID) 50 MCG tablet Take 1 tablet by mouth every morning.    . lidocaine (LIDODERM) 5 % Place 1 patch onto the skin daily as needed. (12 hours on and 12 hours off) 30 patch 5  . lubiprostone (AMITIZA) 8 MCG capsule TAKE 1 CAPSULE (8 MCG TOTAL) BY MOUTH 2 (TWO) TIMES DAILY WITH A MEAL. 180 capsule 1  . montelukast (SINGULAIR) 10 MG tablet Take 1 tablet (10 mg total) by mouth daily. 90 tablet 3  . Multiple Vitamin (MULTI-VITAMINS) TABS Take 1 tablet by mouth daily.    . naloxone (NARCAN) nasal spray 4 mg/0.1 mL Place 1 spray into the nose once. (for suspected overdose)    . olopatadine (PATANOL) 0.1 % ophthalmic solution Use 1 drop in each eye twice daily as needed 5 mL 5  . omeprazole (PRILOSEC) 40 MG capsule Take 1 capsule (40 mg total) by mouth 2 (two) times daily. 180 capsule 3  . oxyCODONE-acetaminophen (PERCOCET) 10-325 MG tablet TAKE 1 TAB EVERY 6 HOURS FOR 5 DAYS THEN 1 TAB TWICE A DAY AS NEEDED FOR SEVERE BREAKTHROUGH PAIN  0  . primidone (MYSOLINE) 50 MG tablet Take 1 tablet (50 mg total) by mouth 2 (two) times daily. 120 tablet 1  . Tiotropium Bromide Monohydrate (SPIRIVA RESPIMAT) 2.5 MCG/ACT AERS Inhale into the lungs.    . traZODone (DESYREL) 100 MG tablet Take 1 tablet (100 mg total) by mouth at bedtime as needed for sleep (take 1 - 2 tablets at bedtime as needed). Take 1-2 tablets at bedtime 60 tablet 0  . Vitamin D, Ergocalciferol, (DRISDOL) 50000  units CAPS capsule Take 1 capsule by mouth once a week.    Marland Kitchen ZTLIDO 1.8 % PTCH SMARTSIG:1 Patch(s) Topical As Needed     No facility-administered medications prior to visit.    Allergies  Allergen Reactions  . Celebrex [Celecoxib]     "because of stomach ulcers"  .  Chantix [Varenicline]   . Fish Oil Other (See Comments)    hemorrhoids  . Nsaids Other (See Comments)    GI Upset. Ulcers  . Other     beets  . Nicotrol [Nicotine] Nausea Only  . Penicillins Rash    Review of Systems  Constitutional: Negative for activity change, appetite change, fatigue and fever.  Cardiovascular: Negative for chest pain.  Gastrointestinal: Positive for abdominal pain (suprapubic). Negative for anal bleeding, blood in stool, constipation, diarrhea, nausea and vomiting.  Genitourinary: Positive for dysuria, frequency, hematuria and urgency.  Musculoskeletal: Positive for back pain (chronic back pain).  Skin: Negative.        Objective:     BP 132/64   Pulse 88   Temp (!) 97.3 F (36.3 C)   Resp 16   Wt 143 lb (64.9 kg)   BMI 24.55 kg/m   Physical Exam Vitals reviewed.  HENT:     Head: Normocephalic.  Cardiovascular:     Rate and Rhythm: Normal rate and regular rhythm.     Pulses: Normal pulses.     Heart sounds: Normal heart sounds.  Pulmonary:     Effort: Pulmonary effort is normal.     Breath sounds: Normal breath sounds.  Abdominal:     General: Bowel sounds are normal.     Tenderness: There is abdominal tenderness (suprapubic). There is no right CVA tenderness, left CVA tenderness or guarding.  Skin:    General: Skin is warm and dry.     Capillary Refill: Capillary refill takes less than 2 seconds.  Neurological:     General: No focal deficit present.     Mental Status: She is alert and oriented to person, place, and time.  Psychiatric:        Mood and Affect: Mood normal.        Behavior: Behavior normal.     BP 132/64   Pulse 88   Temp (!) 97.3 F (36.3 C)    Resp 16   Wt 143 lb (64.9 kg)   BMI 24.55 kg/m  Wt Readings from Last 3 Encounters:  05/30/20 143 lb (64.9 kg)  04/26/20 137 lb (62.1 kg)  04/20/20 139 lb (63 kg)    Health Maintenance Due  Topic Date Due  . Hepatitis C Screening  Never done  . PNA vac Low Risk Adult (2 of 2 - PPSV23) 05/05/2019       Lab Results  Component Value Date   TSH 0.902 01/15/2020   Lab Results  Component Value Date   WBC 11.6 (H) 04/26/2020   HGB 14.5 04/26/2020   HCT 42.9 04/26/2020   MCV 91 04/26/2020   PLT 411 04/26/2020   Lab Results  Component Value Date   NA 143 04/26/2020   K 4.4 04/26/2020   CO2 28 04/26/2020   GLUCOSE 91 04/26/2020   BUN 12 04/26/2020   CREATININE 0.79 04/26/2020   BILITOT 0.3 04/26/2020   ALKPHOS 38 (L) 04/26/2020   AST 17 04/26/2020   ALT 21 04/26/2020   PROT 6.2 04/26/2020   ALBUMIN 4.3 04/26/2020   CALCIUM 9.9 04/26/2020   Lab Results  Component Value Date   CHOL 194 04/26/2020   Lab Results  Component Value Date   HDL 92 04/26/2020   Lab Results  Component Value Date   LDLCALC 81 04/26/2020   Lab Results  Component Value Date   TRIG 122 04/26/2020   Lab Results  Component Value Date   CHOLHDL 2.1  04/26/2020        Assessment & Plan:   1. Acute hemorrhagic cystitis - POCT urinalysis dipstick - Urine Culture - sulfamethoxazole-trimethoprim (BACTRIM DS) 800-160 MG tablet; Take 1 tablet by mouth 2 (two) times daily.  Dispense: 10 tablet; Refill: 0 - phenazopyridine (PYRIDIUM) 100 MG tablet; Take 1 tablet (100 mg total) by mouth 3 (three) times daily as needed for pain.  Dispense: 10 tablet; Refill: 0   Push fluids, especially water Notify office symptoms worsen or fail to improve   Problem List Items Addressed This Visit      Visit Diagnoses    Acute hemorrhagic cystitis    -  Primary   Relevant Orders   POCT urinalysis dipstick   Urine Culture        Signed,  Rip Harbour, NP  05/30/2020 at 19:34

## 2020-05-30 NOTE — Patient Instructions (Addendum)
Push fluids, especially water Notify office symptoms worsen or fail to improve  Urinary Tract Infection, Adult A urinary tract infection (UTI) is an infection of any part of the urinary tract. The urinary tract includes:  The kidneys.  The ureters.  The bladder.  The urethra. These organs make, store, and get rid of pee (urine) in the body. What are the causes? This is caused by germs (bacteria) in your genital area. These germs grow and cause swelling (inflammation) of your urinary tract. What increases the risk? You are more likely to develop this condition if:  You have a small, thin tube (catheter) to drain pee.  You cannot control when you pee or poop (incontinence).  You are female, and: ? You use these methods to prevent pregnancy:  A medicine that kills sperm (spermicide).  A device that blocks sperm (diaphragm). ? You have low levels of a female hormone (estrogen). ? You are pregnant.  You have genes that add to your risk.  You are sexually active.  You take antibiotic medicines.  You have trouble peeing because of: ? A prostate that is bigger than normal, if you are female. ? A blockage in the part of your body that drains pee from the bladder (urethra). ? A kidney stone. ? A nerve condition that affects your bladder (neurogenic bladder). ? Not getting enough to drink. ? Not peeing often enough.  You have other conditions, such as: ? Diabetes. ? A weak disease-fighting system (immune system). ? Sickle cell disease. ? Gout. ? Injury of the spine. What are the signs or symptoms? Symptoms of this condition include:  Needing to pee right away (urgently).  Peeing often.  Peeing small amounts often.  Pain or burning when peeing.  Blood in the pee.  Pee that smells bad or not like normal.  Trouble peeing.  Pee that is cloudy.  Fluid coming from the vagina, if you are female.  Pain in the belly or lower back. Other symptoms  include:  Throwing up (vomiting).  No urge to eat.  Feeling mixed up (confused).  Being tired and grouchy (irritable).  A fever.  Watery poop (diarrhea). How is this treated? This condition may be treated with:  Antibiotic medicine.  Other medicines.  Drinking enough water. Follow these instructions at home:  Medicines  Take over-the-counter and prescription medicines only as told by your doctor.  If you were prescribed an antibiotic medicine, take it as told by your doctor. Do not stop taking it even if you start to feel better. General instructions  Make sure you: ? Pee until your bladder is empty. ? Do not hold pee for a Locey time. ? Empty your bladder after sex. ? Wipe from front to back after pooping if you are a female. Use each tissue one time when you wipe.  Drink enough fluid to keep your pee pale yellow.  Keep all follow-up visits as told by your doctor. This is important. Contact a doctor if:  You do not get better after 1-2 days.  Your symptoms go away and then come back. Get help right away if:  You have very bad back pain.  You have very bad pain in your lower belly.  You have a fever.  You are sick to your stomach (nauseous).  You are throwing up. Summary  A urinary tract infection (UTI) is an infection of any part of the urinary tract.  This condition is caused by germs in your genital area.  There  are many risk factors for a UTI. These include having a small, thin tube to drain pee and not being able to control when you pee or poop.  Treatment includes antibiotic medicines for germs.  Drink enough fluid to keep your pee pale yellow. This information is not intended to replace advice given to you by your health care provider. Make sure you discuss any questions you have with your health care provider. Document Revised: 05/01/2018 Document Reviewed: 11/21/2017 Elsevier Patient Education  [employer]. Phenazopyridine  tablets What is this medicine? PHENAZOPYRIDINE (fen az oh PEER i deen) is a pain reliever. It is used to stop the pain, burning, or discomfort caused by infection or irritation of the urinary tract. This medicine is not an antibiotic. It will not cure a urinary tract infection. This medicine may be used for other purposes; ask your health care provider or pharmacist if you have questions. COMMON BRAND NAME(S): AZO, Azo-100, Azo-Gesic, Azo-Septic, Azo-Standard, Phenazo, Prodium, Pyridium, Urinary Analgesic, Uristat, Uristat Ultra What should I tell my health care provider before I take this medicine? They need to know if you have any of these conditions:  glucose-6-phosphate dehydrogenase (G6PD) deficiency  kidney disease  an unusual or allergic reaction to phenazopyridine, other medicines, foods, dyes, or preservatives  pregnant or trying to get pregnant  breast-feeding How should I use this medicine? Take this medicine by mouth with a glass of water. Follow the directions on the prescription label. Take after meals. Take your doses at regular intervals. Do not take your medicine more often than directed. Do not skip doses or stop your medicine early even if you feel better. Do not stop taking except on your doctor's advice. Talk to your pediatrician regarding the use of this medicine in children. Special care may be needed. Overdosage: If you think you have taken too much of this medicine contact a poison control center or emergency room at once. NOTE: This medicine is only for you. Do not share this medicine with others. What if I miss a dose? If you miss a dose, take it as soon as you can. If it is almost time for your next dose, take only that dose. Do not take double or extra doses. What may interact with this medicine? Interactions are not expected. This list may not describe all possible interactions. Give your health care provider a list of all the medicines, herbs, non-prescription  drugs, or dietary supplements you use. Also tell them if you smoke, drink alcohol, or use illegal drugs. Some items may interact with your medicine. What should I watch for while using this medicine? Tell your doctor or health care professional if your symptoms do not improve or if they get worse. This medicine colors body fluids red. This effect is harmless and will go away after you are done taking the medicine. It will change urine to an dark orange or red color. The red color may stain clothing. Soft contact lenses may become permanently stained. It is best not to wear soft contact lenses while taking this medicine. If you are diabetic you may get a false positive result for sugar in your urine. Talk to your health care provider. What side effects may I notice from receiving this medicine? Side effects that you should report to your doctor or health care professional as soon as possible:  allergic reactions like skin rash, itching or hives, swelling of the face, lips, or tongue  blue or purple color of the skin  difficulty  breathing  fever  less urine  unusual bleeding, bruising  unusual tired, weak  vomiting  yellowing of the eyes or skin Side effects that usually do not require medical attention (report to your doctor or health care professional if they continue or are bothersome):  dark urine  headache  stomach upset This list may not describe all possible side effects. Call your doctor for medical advice about side effects. You may report side effects to FDA at 1-800-FDA-1088. Where should I keep my medicine? Keep out of the reach of children. Store at room temperature between 15 and 30 degrees C (59 and 86 degrees F). Protect from light and moisture. Throw away any unused medicine after the expiration date. NOTE: This sheet is a summary. It may not cover all possible information. If you have questions about this medicine, talk to your doctor, pharmacist, or health care  provider.  2020 Elsevier/Gold Standard (2007-12-11 11:04:07)  Sulfamethoxazole; Trimethoprim, SMX-TMP tablets What is this medicine? SULFAMETHOXAZOLE; TRIMETHOPRIM or SMX-TMP (suhl fuh meth OK suh zohl; trye METH oh prim) is a combination of a sulfonamide antibiotic and a second antibiotic, trimethoprim. It is used to treat or prevent certain kinds of bacterial infections. It will not work for colds, flu, or other viral infections. This medicine may be used for other purposes; ask your health care provider or pharmacist if you have questions. COMMON BRAND NAME(S): Bacter-Aid DS, Bactrim, Bactrim DS, Septra, Septra DS What should I tell my health care provider before I take this medicine? They need to know if you have any of these conditions:  G6PD deficiency  HIV or AIDS  kidney disease  liver disease  low platelets  low red blood cell counts  poor nutrition  stomach or intestine problems like colitis  thyroid disease  an unusual or allergic reaction to sulfamethoxazole, trimethoprim, sulfa drugs, other drugs, foods, dyes, or preservatives  pregnant or trying to get pregnant  breast-feeding How should I use this medicine? Take this medicine by mouth with a glass of water. Follow the directions on the prescription label. Take your medicine at regular intervals. Do not take it more often than directed. Take all of your medicine as directed even if you think you are better. Do not skip doses or stop your medicine early. Talk to your pediatrician regarding the use of this medicine in children. While this drug may be prescribed for children as young as 2 months for selected conditions, precautions do apply. Overdosage: If you think you have taken too much of this medicine contact a poison control center or emergency room at once. NOTE: This medicine is only for you. Do not share this medicine with others. What if I miss a dose? If you miss a dose, take it as soon as you can. If it  is almost time for your next dose, take only that dose. Do not take double or extra doses. What may interact with this medicine? Do not take this medicine with any of the following medications:  dofetilide This medicine may also interact with the following medications:  amantadine  birth control pills  certain medicines for blood pressure, heart disease  certain medicines for depression, like amitriptyline  certain medicines for diabetes, like glipizide or glyburide  certain medicines that treat or prevent blood clots like warfarin  cyclosporine  digoxin  diuretics  indomethacin  methotrexate  phenytoin  procainamide  pyrimethamine  zidovudine This list may not describe all possible interactions. Give your health care provider a list  of all the medicines, herbs, non-prescription drugs, or dietary supplements you use. Also tell them if you smoke, drink alcohol, or use illegal drugs. Some items may interact with your medicine. What should I watch for while using this medicine? Tell your health care provider if your symptoms do not start to get better or if they get worse. Do not treat diarrhea with over the counter products. Contact your health care provider if you have diarrhea that lasts more than 2 days or if it is severe and watery. This drug may cause serious skin reactions. They can happen weeks to months after starting the drug. Contact your health care provider right away if you notice fevers or flu-like symptoms with a rash. The rash may be red or purple and then turn into blisters or peeling of the skin. Or, you might notice a red rash with swelling of the face, lips or lymph nodes in your neck or under your arms. This drug can make you more sensitive to the sun. Keep out of the sun. If you cannot avoid being in the sun, wear protective clothing and sunscreen. Do not use sun lamps or tanning beds/booths. Be careful brushing or flossing your teeth or using a toothpick  because you may get an infection or bleed more easily. If you have any dental work done, tell your dentist you are receiving this drug. What side effects may I notice from receiving this medicine? Side effects that you should report to your doctor or health care professional as soon as possible:  allergic reactions (skin rash, itching or hives; swelling of the face, lips, or tongue)  bloody or watery diarrhea  heartbeat rhythm changes (trouble breathing; chest pain; dizziness; fast, irregular heartbeat; feeling faint or lightheaded, falls,)  fever  high potassium levels (chest pain; fast, irregular heartbeat; muscle weakness)  kidney injury (trouble passing urine or change in the amount of urine)  low blood sugar (feeling anxious; confusion; dizziness; increased hunger; unusually weak or tired; increased sweating; shakiness; cold, clammy skin; irritable; headache; blurred vision; fast heartbeat; loss of consciousness)  low red blood cell counts (trouble breathing; feeling faint; lightheaded, falls; unusually weak or tired)  rash, fever, and swollen lymph nodes  redness, blistering, peeling, or loosening of the skin, including inside the mouth  unusual bruising or bleeding Side effects that usually do not require medical attention (report to your doctor or health care professional if they continue or are bothersome):  loss of appetite  nausea  vomiting This list may not describe all possible side effects. Call your doctor for medical advice about side effects. You may report side effects to FDA at 1-800-FDA-1088. Where should I keep my medicine? Keep out of the reach of children. Store between 15 and 25 degrees C (59 to 77 degrees F). Protect from light. Keep the container tightly closed. Throw away any unused drug after the expiration date. NOTE: This sheet is a summary. It may not cover all possible information. If you have questions about this medicine, talk to your doctor,  pharmacist, or health care provider.  2020 Elsevier/Gold Standard (2019-01-30 12:53:51)

## 2020-06-01 ENCOUNTER — Other Ambulatory Visit: Payer: Self-pay | Admitting: Nurse Practitioner

## 2020-06-01 LAB — URINE CULTURE

## 2020-06-01 MED ORDER — NITROFURANTOIN MONOHYD MACRO 100 MG PO CAPS: 100.0000 mg | ORAL_CAPSULE | Freq: Two times a day (BID) | ORAL | 0 refills | Status: DC

## 2020-06-07 ENCOUNTER — Telehealth: Payer: Self-pay

## 2020-06-07 DIAGNOSIS — M5416 Radiculopathy, lumbar region: Secondary | ICD-10-CM | POA: Diagnosis not present

## 2020-06-07 NOTE — Telephone Encounter (Signed)
Connie Coffey called to report that her urinary tract infection has not resolved.  She also reported having 3 falls over the last 2 weeks.  Appointment scheduled for tomorrow morning.

## 2020-06-08 ENCOUNTER — Other Ambulatory Visit: Payer: Self-pay | Admitting: Family Medicine

## 2020-06-08 ENCOUNTER — Ambulatory Visit (INDEPENDENT_AMBULATORY_CARE_PROVIDER_SITE_OTHER): Payer: Medicare Other | Admitting: Family Medicine

## 2020-06-08 ENCOUNTER — Encounter: Payer: Self-pay | Admitting: Family Medicine

## 2020-06-08 ENCOUNTER — Other Ambulatory Visit: Payer: Self-pay

## 2020-06-08 VITALS — BP 118/78 | HR 90 | Temp 97.2°F | Ht 63.0 in | Wt 138.0 lb

## 2020-06-08 DIAGNOSIS — N3001 Acute cystitis with hematuria: Secondary | ICD-10-CM

## 2020-06-08 DIAGNOSIS — S0083XA Contusion of other part of head, initial encounter: Secondary | ICD-10-CM

## 2020-06-08 LAB — POCT URINALYSIS DIPSTICK
Bilirubin, UA: NEGATIVE
Blood, UA: POSITIVE
Glucose, UA: NEGATIVE
Ketones, UA: NEGATIVE
Nitrite, UA: NEGATIVE
Protein, UA: NEGATIVE
Spec Grav, UA: 1.015 (ref 1.010–1.025)
Urobilinogen, UA: 0.2 E.U./dL
pH, UA: 6 (ref 5.0–8.0)

## 2020-06-08 MED ORDER — LEVOFLOXACIN 250 MG PO TABS: 250.0000 mg | ORAL_TABLET | Freq: Every day | ORAL | 0 refills | Status: DC

## 2020-06-08 MED ORDER — CIPROFLOXACIN HCL 500 MG PO TABS: 500.0000 mg | ORAL_TABLET | Freq: Two times a day (BID) | ORAL | 0 refills | Status: DC

## 2020-06-08 MED ORDER — PHENAZOPYRIDINE HCL 200 MG PO TABS: 200.0000 mg | ORAL_TABLET | Freq: Three times a day (TID) | ORAL | 0 refills | Status: DC | PRN

## 2020-06-08 NOTE — Progress Notes (Signed)
Acute Office Visit  Subjective:    Patient ID: Connie Coffey, female    DOB: 09/29/1955, 65 y.o.   MRN: [id number]  Chief Complaint  Patient presents with  . Urinary Tract Infection    HPI Patient is in today for UTIc/o of dysuria and pressure for 2 weeks, patient also fell over while changing the filter in her well house. Fall occurred on January 5th. Patient hit her face, then had a "severe" headaches on the 6th through 8th. No more headaches since then. No LOC. No neurological changes.   Past Medical History:  Diagnosis Date  . Adrenal insufficiency ([place])   . Arthritis   . Carpal tunnel syndrome    Both hands, worse in right  . Chronic constipation   . COPD (chronic obstructive pulmonary disease) ([place])   . CVA (cerebral vascular accident) ([place])   . Facet syndrome   . Fibromyalgia   . GERD (gastroesophageal reflux disease)   . Heart murmur   . Hypercholesteremia   . IBS (irritable bowel syndrome)   . Ischemic stroke ([place])    07/19/2004  . Lumbar radiculopathy   . Osteoporosis   . RLS (restless legs syndrome)   . Spinal stenosis   . TMJ (temporomandibular joint disorder)   . Trochanteric bursitis of both hips   . Vascular malformation    spinal cord    Past Surgical History:  Procedure Laterality Date  . APPENDECTOMY    . CARPAL TUNNEL RELEASE Right 12/13/2017   surgery  . CHOLECYSTECTOMY    . COLONOSCOPY  05/06/2017   Colonic polyps status post polypectomy. Interan land external hemorrhoids.   . ESOPHAGOGASTRODUODENOSCOPY  04/28/2007   Mild gastrtiis. Otherwise normal EGD.   Marland Kitchen HEMORROIDECTOMY    . LAMINECTOMY AND MICRODISCECTOMY LUMBAR SPINE  01/03/2005  . NECK SURGERY     x2   . OTHER SURGICAL HISTORY  04/24/2004   anterior cervical discectomy and fusion at C5-C6  . TONSILLECTOMY    . TOTAL ABDOMINAL HYSTERECTOMY      Family History  Problem Relation Age of Onset  . Supraventricular tachycardia Mother   . Dementia Mother   . Hyperlipidemia Mother    . Hypertension Mother   . Osteoarthritis Mother   . Osteoporosis Mother   . Breast cancer Other   . Breast cancer Other   . Colon cancer Neg Hx   . Esophageal cancer Neg Hx     Social History   Socioeconomic History  . Marital status: Divorced    Spouse name: Not on file  . Number of children: 1  . Years of education: Not on file  . Highest education level: Not on file  Occupational History  . Occupation: Disabled  Tobacco Use  . Smoking status: Current Every Day Smoker    Packs/day: 1.50    Types: Cigarettes  . Smokeless tobacco: Never Used  Vaping Use  . Vaping Use: Never used  Substance and Sexual Activity  . Alcohol use: Not Currently    Comment: quit 19 years  . Drug use: Never  . Sexual activity: Not on file  Other Topics Concern  . Not on file  Social History Narrative  . Not on file   Social Determinants of Health   Financial Resource Strain: Not on file  Food Insecurity: No Food Insecurity  . Worried About Charity fundraiser in the Last Year: Never true  . Ran Out of Food in the Last Year: Never true  Transportation Needs:  No Transportation Needs  . Lack of Transportation (Medical): No  . Lack of Transportation (Non-Medical): No  Physical Activity: Not on file  Stress: Stress Concern Present  . Feeling of Stress : Very much  Social Connections: Not on file  Intimate Partner Violence: Not on file    Outpatient Medications Prior to Visit  Medication Sig Dispense Refill  . albuterol (VENTOLIN HFA) 108 (90 Base) MCG/ACT inhaler Inhale into the lungs.    Marland Kitchen alendronate (FOSAMAX) 70 MG tablet Take 1 tablet by mouth once a week.    Marland Kitchen atorvastatin (LIPITOR) 40 MG tablet TAKE 1 TABLET BY MOUTH  DAILY 90 tablet 3  . busPIRone (BUSPAR) 30 MG tablet Take 1 tablet (30 mg total) by mouth 2 (two) times daily. 180 tablet 1  . clonazePAM (KLONOPIN) 0.5 MG tablet TAKE 1 TABLET BY MOUTH TWICE A DAY AS NEEDED FOR ANXIETY 60 tablet 1  . cyclobenzaprine (FLEXERIL) 10  MG tablet Take 10 mg by mouth 3 (three) times daily.    . diclofenac sodium (VOLTAREN) 1 % GEL Apply 1 application topically daily as needed.    . DULoxetine (CYMBALTA) 60 MG capsule Take 1 capsule by mouth 2 (two) times daily.    Marland Kitchen DYMISTA 137-50 MCG/ACT SUSP One spray each nostril twice a day. 23 g 3  . fenofibrate 160 MG tablet Take 1 tablet (160 mg total) by mouth daily. 90 tablet 1  . gabapentin (NEURONTIN) 600 MG tablet Take 1 tablet by mouth 4 (four) times daily.    . hydrocortisone (CORTEF) 20 MG tablet Take 1 tablet in the morning and 1/2 tablet at 4pm    . levocetirizine (XYZAL) 5 MG tablet TAKE 1 TABLET BY MOUTH EVERY DAY IN THE EVENING 90 tablet 2  . levothyroxine (SYNTHROID, LEVOTHROID) 50 MCG tablet Take 1 tablet by mouth every morning.    . lidocaine (LIDODERM) 5 % Place 1 patch onto the skin daily as needed. (12 hours on and 12 hours off) 30 patch 5  . lubiprostone (AMITIZA) 8 MCG capsule TAKE 1 CAPSULE (8 MCG TOTAL) BY MOUTH 2 (TWO) TIMES DAILY WITH A MEAL. 180 capsule 1  . montelukast (SINGULAIR) 10 MG tablet Take 1 tablet (10 mg total) by mouth daily. 90 tablet 3  . Multiple Vitamin (MULTI-VITAMINS) TABS Take 1 tablet by mouth daily.    . naloxone (NARCAN) nasal spray 4 mg/0.1 mL Place 1 spray into the nose once. (for suspected overdose)    . olopatadine (PATANOL) 0.1 % ophthalmic solution Use 1 drop in each eye twice daily as needed 5 mL 5  . omeprazole (PRILOSEC) 40 MG capsule Take 1 capsule (40 mg total) by mouth 2 (two) times daily. 180 capsule 3  . oxyCODONE-acetaminophen (PERCOCET) 10-325 MG tablet TAKE 1 TAB EVERY 6 HOURS FOR 5 DAYS THEN 1 TAB TWICE A DAY AS NEEDED FOR SEVERE BREAKTHROUGH PAIN  0  . primidone (MYSOLINE) 50 MG tablet Take 1 tablet (50 mg total) by mouth 2 (two) times daily. 120 tablet 1  . Tiotropium Bromide Monohydrate (SPIRIVA RESPIMAT) 2.5 MCG/ACT AERS Inhale into the lungs.    . traZODone (DESYREL) 100 MG tablet Take 1 tablet (100 mg total) by mouth at  bedtime as needed for sleep (take 1 - 2 tablets at bedtime as needed). Take 1-2 tablets at bedtime 60 tablet 0  . Vitamin D, Ergocalciferol, (DRISDOL) 50000 units CAPS capsule Take 1 capsule by mouth once a week.    Marland Kitchen ZTLIDO 1.8 % PTCH SMARTSIG:1  Patch(s) Topical As Needed    . nitrofurantoin, macrocrystal-monohydrate, (MACROBID) 100 MG capsule Take 1 capsule (100 mg total) by mouth 2 (two) times daily. 14 capsule 0  . phenazopyridine (PYRIDIUM) 100 MG tablet Take 1 tablet (100 mg total) by mouth 3 (three) times daily as needed for pain. 10 tablet 0  . sulfamethoxazole-trimethoprim (BACTRIM DS) 800-160 MG tablet Take 1 tablet by mouth 2 (two) times daily. 10 tablet 0   No facility-administered medications prior to visit.    Allergies  Allergen Reactions  . Celebrex [Celecoxib]     "because of stomach ulcers"  . Chantix [Varenicline]   . Fish Oil Other (See Comments)    hemorrhoids  . Nsaids Other (See Comments)    GI Upset. Ulcers  . Other     beets  . Nicotrol [Nicotine] Nausea Only  . Penicillins Rash    Review of Systems  Constitutional: Negative for chills and fever.  HENT: Negative for congestion.   Cardiovascular: Negative for chest pain.  Gastrointestinal: Negative for nausea.  Genitourinary: Positive for dysuria, frequency and urgency.  Musculoskeletal: Negative for back pain.  Neurological: Positive for headaches. Negative for dizziness.       Objective:    Physical Exam Vitals reviewed.  Constitutional:      Appearance: Normal appearance. She is normal weight.  Cardiovascular:     Rate and Rhythm: Normal rate and regular rhythm.     Pulses: Normal pulses.     Heart sounds: Normal heart sounds.  Pulmonary:     Effort: Pulmonary effort is normal. No respiratory distress.     Breath sounds: Normal breath sounds.  Abdominal:     General: Abdomen is flat. Bowel sounds are normal.     Palpations: Abdomen is soft.     Tenderness: There is no abdominal  tenderness.  Neurological:     General: No focal deficit present.     Mental Status: She is alert and oriented to person, place, and time.     Cranial Nerves: No cranial nerve deficit.  Psychiatric:        Mood and Affect: Mood normal.        Behavior: Behavior normal.     BP 118/78   Pulse 90   Temp (!) 97.2 F (36.2 C)   Ht 5\' 3"  (1.6 m)   Wt 138 lb (62.6 kg)   SpO2 100%   BMI 24.45 kg/m  Wt Readings from Last 3 Encounters:  06/08/20 138 lb (62.6 kg)  05/30/20 143 lb (64.9 kg)  04/26/20 137 lb (62.1 kg)    Health Maintenance Due  Topic Date Due  . Hepatitis C Screening  Never done  . PNA vac Low Risk Adult (2 of 2 - PPSV23) 05/05/2019    There are no preventive care reminders to display for this patient.   Lab Results  Component Value Date   TSH 0.902 01/15/2020   Lab Results  Component Value Date   WBC 11.6 (H) 04/26/2020   HGB 14.5 04/26/2020   HCT 42.9 04/26/2020   MCV 91 04/26/2020   PLT 411 04/26/2020   Lab Results  Component Value Date   NA 143 04/26/2020   K 4.4 04/26/2020   CO2 28 04/26/2020   GLUCOSE 91 04/26/2020   BUN 12 04/26/2020   CREATININE 0.79 04/26/2020   BILITOT 0.3 04/26/2020   ALKPHOS 38 (L) 04/26/2020   AST 17 04/26/2020   ALT 21 04/26/2020   PROT 6.2 04/26/2020   ALBUMIN  4.3 04/26/2020   CALCIUM 9.9 04/26/2020   Lab Results  Component Value Date   CHOL 194 04/26/2020   Lab Results  Component Value Date   HDL 92 04/26/2020   Lab Results  Component Value Date   LDLCALC 81 04/26/2020   Lab Results  Component Value Date   TRIG 122 04/26/2020   Lab Results  Component Value Date   CHOLHDL 2.1 04/26/2020   No results found for: HGBA1C     Assessment & Plan:  1. Acute cystitis with hematuria - POCT urinalysis dipstick - Urine Culture - phenazopyridine (PYRIDIUM) 200 MG tablet; Take 1 tablet (200 mg total) by mouth 3 (three) times daily as needed for pain.  Dispense: 10 tablet; Refill: 0 RX: cipro sent, but was  on back order. Sent rx levaquin 250 mg one daily x 3 days.   2. Contusion of other part of head, initial encounter  No bruising.  NO neurological abnormalities.  Monitor.  No scan at this time.   Meds ordered this encounter  Medications  . DISCONTD: ciprofloxacin (CIPRO) 500 MG tablet    Sig: Take 1 tablet (500 mg total) by mouth 2 (two) times daily.    Dispense:  14 tablet    Refill:  0  . phenazopyridine (PYRIDIUM) 200 MG tablet    Sig: Take 1 tablet (200 mg total) by mouth 3 (three) times daily as needed for pain.    Dispense:  10 tablet    Refill:  0    Orders Placed This Encounter  Procedures  . Urine Culture  . POCT urinalysis dipstick     Follow-up: No follow-ups on file.  An After Visit Summary was printed and given to the patient.  [name], MD [facility] [phone]

## 2020-06-10 LAB — URINE CULTURE

## 2020-06-14 ENCOUNTER — Telehealth: Payer: Self-pay

## 2020-06-14 NOTE — Telephone Encounter (Signed)
Connie Coffey called back to report that her urinary discomfort has completely resolved.  She is not going to bring a urine specimen in unless her discomfort returns.

## 2020-06-18 DIAGNOSIS — B9689 Other specified bacterial agents as the cause of diseases classified elsewhere: Secondary | ICD-10-CM | POA: Diagnosis not present

## 2020-06-18 DIAGNOSIS — N3001 Acute cystitis with hematuria: Secondary | ICD-10-CM | POA: Diagnosis not present

## 2020-06-18 DIAGNOSIS — M4306 Spondylolysis, lumbar region: Secondary | ICD-10-CM | POA: Diagnosis not present

## 2020-06-18 DIAGNOSIS — N281 Cyst of kidney, acquired: Secondary | ICD-10-CM | POA: Diagnosis not present

## 2020-06-18 DIAGNOSIS — R350 Frequency of micturition: Secondary | ICD-10-CM | POA: Diagnosis not present

## 2020-06-18 DIAGNOSIS — M4316 Spondylolisthesis, lumbar region: Secondary | ICD-10-CM | POA: Diagnosis not present

## 2020-06-18 LAB — POCT LACTIC ACID (LACTATE): Lactate, POC: 1.8

## 2020-06-22 ENCOUNTER — Other Ambulatory Visit: Payer: Self-pay

## 2020-06-22 ENCOUNTER — Encounter: Payer: Self-pay | Admitting: Sports Medicine

## 2020-06-22 ENCOUNTER — Ambulatory Visit (INDEPENDENT_AMBULATORY_CARE_PROVIDER_SITE_OTHER): Payer: Medicare Other

## 2020-06-22 ENCOUNTER — Ambulatory Visit: Payer: Medicare Other | Admitting: Sports Medicine

## 2020-06-22 DIAGNOSIS — M79671 Pain in right foot: Secondary | ICD-10-CM | POA: Diagnosis not present

## 2020-06-22 DIAGNOSIS — M19071 Primary osteoarthritis, right ankle and foot: Secondary | ICD-10-CM | POA: Diagnosis not present

## 2020-06-22 DIAGNOSIS — S92211A Displaced fracture of cuboid bone of right foot, initial encounter for closed fracture: Secondary | ICD-10-CM

## 2020-06-22 DIAGNOSIS — M792 Neuralgia and neuritis, unspecified: Secondary | ICD-10-CM | POA: Diagnosis not present

## 2020-06-22 DIAGNOSIS — M5386 Other specified dorsopathies, lumbar region: Secondary | ICD-10-CM

## 2020-06-22 DIAGNOSIS — S92211D Displaced fracture of cuboid bone of right foot, subsequent encounter for fracture with routine healing: Secondary | ICD-10-CM | POA: Diagnosis not present

## 2020-06-22 NOTE — Progress Notes (Signed)
Subjective: Connie Coffey is a 65 y.o. female patient who returns office for follow-up evaluation of foot pain reports that the brace helps still has some pain but it is less and denies any sharp shooting pains at this time in the right foot and ankle.  Patient reports that she still struggles with some swelling and tripping and falling.  Patient reports that she was told by her back doctor to use her walker which helps.  Patient also admits that she is supposed to have an MRI and be scheduled for back surgery after the MRI been completed.  Patient denies any new pedal complaints or issues at this time.  Patient Active Problem List   Diagnosis Date Noted  . Simple chronic bronchitis ([place]) 02/16/2020  . Acute sinusitis 12/29/2019  . Tick bite of left side of chest wall 11/10/2019  . Mixed hyperlipidemia 10/19/2019  . Adrenal insufficiency (Addison's disease) ([place]) 10/19/2019  . Obstructive chronic bronchitis with exacerbation ([place]) 09/08/2019  . Sciatica of right side associated with disorder of lumbar spine 07/18/2019  . Acute serous otitis media of left ear 07/18/2019  . History of colonic polyps 02/16/2019  . History of hyperparathyroidism 12/22/2018  . Lung nodules 07/18/2018  . Centrilobular emphysema ([place]) 03/11/2018  . Cigarette smoker 03/11/2018  . Chronic pain syndrome 04/15/2017  . Depression with anxiety 03/21/2017  . Age-related osteoporosis without current pathological fracture 01/18/2017  . Acquired hypothyroidism 09/22/2015  . Hyperparathyroidism ([place]) 09/22/2015  . Vitamin D deficiency 09/22/2015  . Anxiety state 08/06/2013  . Left carpal tunnel syndrome 06/04/2013  . Neck pain 01/21/2013  . Low back pain 11/21/2012  . Chronic airway obstruction ([place]) 11/01/2012    Current Outpatient Medications on File Prior to Visit  Medication Sig Dispense Refill  . albuterol (VENTOLIN HFA) 108 (90 Base) MCG/ACT inhaler Inhale into the lungs.    Marland Kitchen alendronate (FOSAMAX) 70 MG tablet  Take 1 tablet by mouth once a week.    Marland Kitchen atorvastatin (LIPITOR) 40 MG tablet TAKE 1 TABLET BY MOUTH  DAILY 90 tablet 3  . busPIRone (BUSPAR) 30 MG tablet Take 1 tablet (30 mg total) by mouth 2 (two) times daily. 180 tablet 1  . cefdinir (OMNICEF) 300 MG capsule     . clonazePAM (KLONOPIN) 0.5 MG tablet TAKE 1 TABLET BY MOUTH TWICE A DAY AS NEEDED FOR ANXIETY 60 tablet 1  . cyclobenzaprine (FLEXERIL) 10 MG tablet Take 10 mg by mouth 3 (three) times daily.    . diclofenac sodium (VOLTAREN) 1 % GEL Apply 1 application topically daily as needed.    . DULoxetine (CYMBALTA) 60 MG capsule Take 1 capsule by mouth 2 (two) times daily.    Marland Kitchen DYMISTA 137-50 MCG/ACT SUSP One spray each nostril twice a day. 23 g 3  . fenofibrate 160 MG tablet Take 1 tablet (160 mg total) by mouth daily. 90 tablet 1  . gabapentin (NEURONTIN) 600 MG tablet Take 1 tablet by mouth 4 (four) times daily.    . hydrocortisone (CORTEF) 20 MG tablet Take 1 tablet in the morning and 1/2 tablet at 4pm    . levocetirizine (XYZAL) 5 MG tablet TAKE 1 TABLET BY MOUTH EVERY DAY IN THE EVENING 90 tablet 2  . levofloxacin (LEVAQUIN) 250 MG tablet Take 1 tablet (250 mg total) by mouth daily. 3 tablet 0  . levothyroxine (SYNTHROID, LEVOTHROID) 50 MCG tablet Take 1 tablet by mouth every morning.    . lidocaine (LIDODERM) 5 % Place 1 patch onto the  skin daily as needed. (12 hours on and 12 hours off) 30 patch 5  . lubiprostone (AMITIZA) 8 MCG capsule TAKE 1 CAPSULE (8 MCG TOTAL) BY MOUTH 2 (TWO) TIMES DAILY WITH A MEAL. 180 capsule 1  . montelukast (SINGULAIR) 10 MG tablet Take 1 tablet (10 mg total) by mouth daily. 90 tablet 3  . Multiple Vitamin (MULTI-VITAMINS) TABS Take 1 tablet by mouth daily.    . naloxone (NARCAN) nasal spray 4 mg/0.1 mL Place 1 spray into the nose once. (for suspected overdose)    . olopatadine (PATANOL) 0.1 % ophthalmic solution Use 1 drop in each eye twice daily as needed 5 mL 5  . omeprazole (PRILOSEC) 40 MG capsule Take  1 capsule (40 mg total) by mouth 2 (two) times daily. 180 capsule 3  . oxyCODONE-acetaminophen (PERCOCET) 10-325 MG tablet TAKE 1 TAB EVERY 6 HOURS FOR 5 DAYS THEN 1 TAB TWICE A DAY AS NEEDED FOR SEVERE BREAKTHROUGH PAIN  0  . phenazopyridine (PYRIDIUM) 200 MG tablet Take 1 tablet (200 mg total) by mouth 3 (three) times daily as needed for pain. 10 tablet 0  . primidone (MYSOLINE) 50 MG tablet Take 1 tablet (50 mg total) by mouth 2 (two) times daily. 120 tablet 1  . Tiotropium Bromide Monohydrate (SPIRIVA RESPIMAT) 2.5 MCG/ACT AERS Inhale into the lungs.    . traZODone (DESYREL) 100 MG tablet Take 1 tablet (100 mg total) by mouth at bedtime as needed for sleep (take 1 - 2 tablets at bedtime as needed). Take 1-2 tablets at bedtime 60 tablet 0  . Vitamin D, Ergocalciferol, (DRISDOL) 50000 units CAPS capsule Take 1 capsule by mouth once a week.    Marland Kitchen ZTLIDO 1.8 % PTCH SMARTSIG:1 Patch(s) Topical As Needed     No current facility-administered medications on file prior to visit.    Allergies  Allergen Reactions  . Celebrex [Celecoxib]     "because of stomach ulcers"  . Chantix [Varenicline]   . Fish Oil Other (See Comments)    hemorrhoids  . Nsaids Other (See Comments)    GI Upset. Ulcers  . Other     beets  . Nicotrol [Nicotine] Nausea Only  . Penicillins Rash    Objective:  General: Alert and oriented x3 in no acute distress  Dermatology: No open lesions bilateral lower extremities, no webspace macerations, no ecchymosis bilateral, all nails x 10 are well manicured.  Vascular: No edema noted to the right foot PT and DP pedal pulses 1 out of 4 bilateral.  Capillary Fill Time 5 seconds, scant pedal hair growth bilateral, Temperature gradient within normal limits.  Neurology: Gross sensation intact via light touch bilateral, subjective sharp shooting pains to the right foot history of sciatica.  Musculoskeletal: There is decreased tenderness to palpation at tendon course and at the  fifth metatarsal base and cuboid joint.  There is mild guarding with range of motion noted on the right.  Strength 4 out of 5 on right 5 out of 5 on left likely secondary to sciatica.  Cane assisted gait.  Xrays  Right foot   Impression: Healing fracture with some arthritis noted at the fifth metatarsal base and calcaneal cuboid joint.  Fracture appears to be resolving only on the oblique view.  There is mild soft tissue swelling.  No other acute findings noted.    Assessment and Plan: Problem List Items Addressed This Visit      Nervous and Auditory   Sciatica of right side associated with disorder  of lumbar spine    Other Visit Diagnoses    Closed displaced fracture of cuboid of right foot with routine healing, subsequent encounter    -  Primary   Relevant Orders   DG Foot Complete Right (Completed)   Right foot pain       Neuritis       Arthritis of right foot          -Complete examination performed -Xrays reviewed -Re-Discussed treatement options for fracture; risks, alternatives, and benefits explained. -Continue with ankle gauntlet area fracture cuboid that is slowly healing.  Patient is not a candidate for immobilization cam boot or surgical shoe due to significant sciatica and gait disturbance -Continue with follow-up with back doctor -Continue with cane to provide additional stability in gait or walker like previously recommended to prevent against falls -Patient to return to office x-ray of right foot to check cuboid fracture however it is healing very well at this time.  Landis Martins, DPM

## 2020-06-27 ENCOUNTER — Other Ambulatory Visit: Payer: Self-pay | Admitting: Family Medicine

## 2020-06-30 DIAGNOSIS — M5416 Radiculopathy, lumbar region: Secondary | ICD-10-CM | POA: Diagnosis not present

## 2020-06-30 DIAGNOSIS — M545 Low back pain, unspecified: Secondary | ICD-10-CM | POA: Diagnosis not present

## 2020-07-04 ENCOUNTER — Ambulatory Visit (INDEPENDENT_AMBULATORY_CARE_PROVIDER_SITE_OTHER): Payer: Medicare Other | Admitting: Family Medicine

## 2020-07-04 ENCOUNTER — Other Ambulatory Visit: Payer: Self-pay

## 2020-07-04 VITALS — BP 124/62 | HR 80 | Temp 97.3°F | Resp 16 | Ht 63.0 in | Wt 143.0 lb

## 2020-07-04 DIAGNOSIS — N3001 Acute cystitis with hematuria: Secondary | ICD-10-CM | POA: Diagnosis not present

## 2020-07-04 LAB — POCT URINALYSIS DIPSTICK
Bilirubin, UA: NEGATIVE
Glucose, UA: NEGATIVE
Ketones, UA: NEGATIVE
Nitrite, UA: POSITIVE
Protein, UA: POSITIVE — AB
Spec Grav, UA: 1.015 (ref 1.010–1.025)
Urobilinogen, UA: 0.2 E.U./dL
pH, UA: 6 (ref 5.0–8.0)

## 2020-07-04 MED ORDER — SULFAMETHOXAZOLE-TRIMETHOPRIM 800-160 MG PO TABS: 1.0000 | ORAL_TABLET | Freq: Two times a day (BID) | ORAL | 0 refills | Status: DC

## 2020-07-04 MED ORDER — PHENAZOPYRIDINE HCL 200 MG PO TABS: 200.0000 mg | ORAL_TABLET | Freq: Three times a day (TID) | ORAL | 0 refills | Status: DC | PRN

## 2020-07-04 NOTE — Progress Notes (Signed)
Acute Office Visit  Subjective:    Patient ID: Connie Coffey, female    DOB: 08-Dec-1955, 65 y.o.   MRN: [id number]  Chief Complaint  Patient presents with  . Urinary Tract Infection    HPI Patient is in today for UTI sxs.  Complaining of abdominal pain, dysuria, frequency and urgency since Friday.     Past Medical History:  Diagnosis Date  . Adrenal insufficiency ([place])   . Arthritis   . Carpal tunnel syndrome    Both hands, worse in right  . Chronic constipation   . COPD (chronic obstructive pulmonary disease) ([place])   . CVA (cerebral vascular accident) ([place])   . Facet syndrome   . Fibromyalgia   . GERD (gastroesophageal reflux disease)   . Heart murmur   . Hypercholesteremia   . IBS (irritable bowel syndrome)   . Ischemic stroke ([place])    07/19/2004  . Lumbar radiculopathy   . Osteoporosis   . RLS (restless legs syndrome)   . Spinal stenosis   . TMJ (temporomandibular joint disorder)   . Trochanteric bursitis of both hips   . Vascular malformation    spinal cord    Past Surgical History:  Procedure Laterality Date  . APPENDECTOMY    . CARPAL TUNNEL RELEASE Right 12/13/2017   surgery  . CHOLECYSTECTOMY    . COLONOSCOPY  05/06/2017   Colonic polyps status post polypectomy. Interan land external hemorrhoids.   . ESOPHAGOGASTRODUODENOSCOPY  04/28/2007   Mild gastrtiis. Otherwise normal EGD.   Marland Kitchen HEMORROIDECTOMY    . LAMINECTOMY AND MICRODISCECTOMY LUMBAR SPINE  01/03/2005  . NECK SURGERY     x2   . OTHER SURGICAL HISTORY  04/24/2004   anterior cervical discectomy and fusion at C5-C6  . TONSILLECTOMY    . TOTAL ABDOMINAL HYSTERECTOMY      Family History  Problem Relation Age of Onset  . Supraventricular tachycardia Mother   . Dementia Mother   . Hyperlipidemia Mother   . Hypertension Mother   . Osteoarthritis Mother   . Osteoporosis Mother   . Breast cancer Other   . Breast cancer Other   . Colon cancer Neg Hx   . Esophageal cancer Neg Hx      Social History   Socioeconomic History  . Marital status: Divorced    Spouse name: Not on file  . Number of children: 1  . Years of education: Not on file  . Highest education level: Not on file  Occupational History  . Occupation: Disabled  Tobacco Use  . Smoking status: Current Every Day Smoker    Packs/day: 1.50    Types: Cigarettes  . Smokeless tobacco: Never Used  Vaping Use  . Vaping Use: Never used  Substance and Sexual Activity  . Alcohol use: Not Currently    Comment: quit 19 years  . Drug use: Never  . Sexual activity: Not on file  Other Topics Concern  . Not on file  Social History Narrative  . Not on file   Social Determinants of Health   Financial Resource Strain: Not on file  Food Insecurity: Not on file  Transportation Needs: Not on file  Physical Activity: Not on file  Stress: Not on file  Social Connections: Not on file  Intimate Partner Violence: Not on file    Outpatient Medications Prior to Visit  Medication Sig Dispense Refill  . albuterol (VENTOLIN HFA) 108 (90 Base) MCG/ACT inhaler Inhale into the lungs.    Marland Kitchen alendronate (FOSAMAX)  70 MG tablet Take 1 tablet by mouth once a week.    Marland Kitchen atorvastatin (LIPITOR) 40 MG tablet TAKE 1 TABLET BY MOUTH  DAILY 90 tablet 3  . busPIRone (BUSPAR) 30 MG tablet Take 1 tablet (30 mg total) by mouth 2 (two) times daily. 180 tablet 1  . clonazePAM (KLONOPIN) 0.5 MG tablet TAKE 1 TABLET BY MOUTH TWICE A DAY AS NEEDED FOR ANXIETY 60 tablet 1  . cyclobenzaprine (FLEXERIL) 10 MG tablet Take 10 mg by mouth 3 (three) times daily.    . diclofenac sodium (VOLTAREN) 1 % GEL Apply 1 application topically daily as needed.    . DULoxetine (CYMBALTA) 60 MG capsule Take 1 capsule by mouth 2 (two) times daily.    Marland Kitchen DYMISTA 137-50 MCG/ACT SUSP One spray each nostril twice a day. 23 g 3  . fenofibrate 160 MG tablet Take 1 tablet (160 mg total) by mouth daily. 90 tablet 1  . gabapentin (NEURONTIN) 600 MG tablet Take 1 tablet  by mouth 4 (four) times daily.    . hydrocortisone (CORTEF) 20 MG tablet Take 1 tablet in the morning and 1/2 tablet at 4pm    . levocetirizine (XYZAL) 5 MG tablet TAKE 1 TABLET BY MOUTH EVERY DAY IN THE EVENING 90 tablet 2  . levothyroxine (SYNTHROID, LEVOTHROID) 50 MCG tablet Take 1 tablet by mouth every morning.    . lidocaine (LIDODERM) 5 % Place 1 patch onto the skin daily as needed. (12 hours on and 12 hours off) 30 patch 5  . lubiprostone (AMITIZA) 8 MCG capsule TAKE 1 CAPSULE (8 MCG TOTAL) BY MOUTH 2 (TWO) TIMES DAILY WITH A MEAL. 180 capsule 1  . montelukast (SINGULAIR) 10 MG tablet Take 1 tablet (10 mg total) by mouth daily. 90 tablet 3  . Multiple Vitamin (MULTI-VITAMINS) TABS Take 1 tablet by mouth daily.    . naloxone (NARCAN) nasal spray 4 mg/0.1 mL Place 1 spray into the nose once. (for suspected overdose)    . olopatadine (PATANOL) 0.1 % ophthalmic solution Use 1 drop in each eye twice daily as needed 5 mL 5  . omeprazole (PRILOSEC) 40 MG capsule Take 1 capsule (40 mg total) by mouth 2 (two) times daily. 180 capsule 3  . oxyCODONE-acetaminophen (PERCOCET) 10-325 MG tablet TAKE 1 TAB EVERY 6 HOURS FOR 5 DAYS THEN 1 TAB TWICE A DAY AS NEEDED FOR SEVERE BREAKTHROUGH PAIN  0  . primidone (MYSOLINE) 50 MG tablet TAKE 1 TABLET BY MOUTH TWICE A DAY 60 tablet 5  . Tiotropium Bromide Monohydrate (SPIRIVA RESPIMAT) 2.5 MCG/ACT AERS Inhale into the lungs.    . traZODone (DESYREL) 100 MG tablet Take 1 tablet (100 mg total) by mouth at bedtime as needed for sleep (take 1 - 2 tablets at bedtime as needed). Take 1-2 tablets at bedtime 60 tablet 0  . Vitamin D, Ergocalciferol, (DRISDOL) 50000 units CAPS capsule Take 1 capsule by mouth once a week.    Marland Kitchen ZTLIDO 1.8 % PTCH SMARTSIG:1 Patch(s) Topical As Needed    . cefdinir (OMNICEF) 300 MG capsule     . levofloxacin (LEVAQUIN) 250 MG tablet Take 1 tablet (250 mg total) by mouth daily. 3 tablet 0  . phenazopyridine (PYRIDIUM) 200 MG tablet Take 1  tablet (200 mg total) by mouth 3 (three) times daily as needed for pain. 10 tablet 0   No facility-administered medications prior to visit.    Allergies  Allergen Reactions  . Celebrex [Celecoxib]     "because of  stomach ulcers"  . Chantix [Varenicline]   . Fish Oil Other (See Comments)    hemorrhoids  . Nsaids Other (See Comments)    GI Upset. Ulcers  . Other     beets  . Nicotrol [Nicotine] Nausea Only  . Penicillins Rash    Review of Systems  Constitutional: Positive for fatigue. Negative for chills and fever.  HENT: Negative for congestion, rhinorrhea and sore throat.   Respiratory: Negative for cough and shortness of breath.   Cardiovascular: Negative for chest pain.  Gastrointestinal: Positive for abdominal pain. Negative for constipation, diarrhea, nausea and vomiting.  Genitourinary: Positive for dysuria, frequency and urgency.  Musculoskeletal: Negative for back pain and myalgias.  Neurological: Negative for dizziness, weakness, light-headedness and headaches.  Psychiatric/Behavioral: Positive for dysphoric mood. The patient is not nervous/anxious.        Objective:    Physical Exam Vitals reviewed.  Constitutional:      Appearance: Normal appearance.  Cardiovascular:     Rate and Rhythm: Normal rate and regular rhythm.     Heart sounds: Normal heart sounds.  Pulmonary:     Effort: Pulmonary effort is normal. No respiratory distress.     Breath sounds: Normal breath sounds.  Abdominal:     Palpations: Abdomen is soft.     Tenderness: There is no abdominal tenderness.  Neurological:     Mental Status: She is alert.     BP 124/62   Pulse 80   Temp (!) 97.3 F (36.3 C)   Resp 16   Ht 5\' 3"  (1.6 m)   Wt 143 lb (64.9 kg)   BMI 25.33 kg/m  Wt Readings from Last 3 Encounters:  07/04/20 143 lb (64.9 kg)  06/08/20 138 lb (62.6 kg)  05/30/20 143 lb (64.9 kg)    Health Maintenance Due  Topic Date Due  . Hepatitis C Screening  Never done  . PNA vac  Low Risk Adult (2 of 2 - PPSV23) 05/05/2019    There are no preventive care reminders to display for this patient.   Lab Results  Component Value Date   TSH 0.902 01/15/2020   Lab Results  Component Value Date   WBC 11.6 (H) 04/26/2020   HGB 14.5 04/26/2020   HCT 42.9 04/26/2020   MCV 91 04/26/2020   PLT 411 04/26/2020   Lab Results  Component Value Date   NA 143 04/26/2020   K 4.4 04/26/2020   CO2 28 04/26/2020   GLUCOSE 91 04/26/2020   BUN 12 04/26/2020   CREATININE 0.79 04/26/2020   BILITOT 0.3 04/26/2020   ALKPHOS 38 (L) 04/26/2020   AST 17 04/26/2020   ALT 21 04/26/2020   PROT 6.2 04/26/2020   ALBUMIN 4.3 04/26/2020   CALCIUM 9.9 04/26/2020   Lab Results  Component Value Date   CHOL 194 04/26/2020   Lab Results  Component Value Date   HDL 92 04/26/2020   Lab Results  Component Value Date   LDLCALC 81 04/26/2020   Lab Results  Component Value Date   TRIG 122 04/26/2020   Lab Results  Component Value Date   CHOLHDL 2.1 04/26/2020   No results found for: HGBA1C     Assessment & Plan:  1. Acute cystitis with hematuria Bactrim Ds and pyridium sent.  - Urine Culture - POCT urinalysis dipstick   Orders Placed This Encounter  Procedures  . Urine Culture  . POCT urinalysis dipstick    Follow-up: No follow-ups on file.  An After  Visit Summary was printed and given to the patient.  [name], MD [facility] [phone]

## 2020-07-07 LAB — URINE CULTURE

## 2020-07-08 ENCOUNTER — Other Ambulatory Visit: Payer: Self-pay | Admitting: Family Medicine

## 2020-07-08 DIAGNOSIS — N3001 Acute cystitis with hematuria: Secondary | ICD-10-CM

## 2020-07-08 MED ORDER — CIPROFLOXACIN HCL 500 MG PO TABS: 500.0000 mg | ORAL_TABLET | Freq: Two times a day (BID) | ORAL | 0 refills | Status: DC

## 2020-07-08 MED ORDER — PHENAZOPYRIDINE HCL 200 MG PO TABS: 200.0000 mg | ORAL_TABLET | Freq: Three times a day (TID) | ORAL | 0 refills | Status: DC | PRN

## 2020-07-11 ENCOUNTER — Other Ambulatory Visit: Payer: Self-pay | Admitting: Family Medicine

## 2020-07-11 DIAGNOSIS — N3001 Acute cystitis with hematuria: Secondary | ICD-10-CM

## 2020-07-11 MED ORDER — PHENAZOPYRIDINE HCL 200 MG PO TABS
200.0000 mg | ORAL_TABLET | Freq: Three times a day (TID) | ORAL | 0 refills | Status: DC | PRN
Start: 2020-07-11 — End: 2020-08-31

## 2020-07-11 MED ORDER — NITROFURANTOIN MONOHYD MACRO 100 MG PO CAPS: 100.0000 mg | ORAL_CAPSULE | Freq: Two times a day (BID) | ORAL | 0 refills | Status: DC

## 2020-07-18 ENCOUNTER — Encounter: Payer: Self-pay | Admitting: Family Medicine

## 2020-07-21 ENCOUNTER — Telehealth: Payer: Self-pay

## 2020-07-21 ENCOUNTER — Other Ambulatory Visit: Payer: Self-pay

## 2020-07-21 ENCOUNTER — Other Ambulatory Visit: Payer: Self-pay | Admitting: Family Medicine

## 2020-07-21 MED ORDER — OLOPATADINE HCL 0.1 % OP SOLN: OPHTHALMIC | 3 refills | Status: DC

## 2020-07-21 NOTE — Telephone Encounter (Signed)
This is fine. Please check which pharmacy and if she wants 90 d/3 rfs. Kc

## 2020-07-21 NOTE — Telephone Encounter (Signed)
Pt left message question if our office could take over her eye drops and send in a refill. She received them from allergist but does not want to return to them "just for eye drops." Please advise.  Royce Macadamia, [place] 07/21/20 11:01 AM

## 2020-07-22 ENCOUNTER — Other Ambulatory Visit: Payer: Self-pay | Admitting: Physician Assistant

## 2020-07-25 ENCOUNTER — Other Ambulatory Visit: Payer: Self-pay | Admitting: Family Medicine

## 2020-07-25 DIAGNOSIS — M7138 Other bursal cyst, other site: Secondary | ICD-10-CM | POA: Diagnosis not present

## 2020-07-25 DIAGNOSIS — M5416 Radiculopathy, lumbar region: Secondary | ICD-10-CM | POA: Diagnosis not present

## 2020-07-25 NOTE — Progress Notes (Signed)
Subjective:  Patient ID: Connie Coffey, female    DOB: 01-02-1956  Age: 65 y.o. MRN: [id number]  Chief Complaint  Patient presents with  . Depression  . Hypothyroidism    HPI Mixed hyperlipidemia - Patient is taking atorvastatin 40 mg daily, fenofibrate 160 mg 1 tablet daily. Not eating healthy. Not able to exercise due to back pain.   Acquired hypothyroidism - Patient takes levothyroxine 50 mcg daily.  Chronic pain syndrome - Patient is taking oxycodone-acetaminophen 10-325 mg every 6 hours as needed. ESI yesterday. Feels better today.  Cigarette nicotine dependence with other nicotine-induced disorder Smoking 1 ppd decreased from 2 ppd over the last year.   Depression, major, recurrent, moderate - Patient is currently taking trazodone 100 mg at bedtime, Duloxetine 60 mg twice a day, buspirone 30 mg 1 tablet two times a day, clonazepam. Patient's depression has worsened, but she feels due to winter weather and recurrent infections. Also lonely as son is staying with his father more.   Simple chronic bronchitis: On spiriva  - Patient is using albuterol inhaler 2 puffs every 6 hours as needed.  Age-related osteoporosis without current pathological fracture - Patient is currently in alendronate 70 mg once a week.  Vitamin D deficiency - Patient takes vitamin D 50000 units 1 capsule by mouth a week.   UTI sxs: Took 1 week of bactrim ds and week of macobid, and levaquin.  Pt has dysuria intermittently. Pt has increased her water intake and so has a lot of urination.  Current Outpatient Medications on File Prior to Visit  Medication Sig Dispense Refill  . nitrofurantoin, macrocrystal-monohydrate, (MACROBID) 100 MG capsule Take 1 capsule (100 mg total) by mouth 2 (two) times daily. 14 capsule 0  . albuterol (VENTOLIN HFA) 108 (90 Base) MCG/ACT inhaler Inhale into the lungs.    Marland Kitchen alendronate (FOSAMAX) 70 MG tablet Take 1 tablet by mouth once a week.    Marland Kitchen atorvastatin (LIPITOR)  40 MG tablet TAKE 1 TABLET BY MOUTH  DAILY 90 tablet 3  . busPIRone (BUSPAR) 30 MG tablet Take 1 tablet (30 mg total) by mouth 2 (two) times daily. 180 tablet 1  . clonazePAM (KLONOPIN) 0.5 MG tablet TAKE 1 TABLET BY MOUTH TWICE A DAY AS NEEDED FOR ANXIETY 60 tablet 1  . cyclobenzaprine (FLEXERIL) 10 MG tablet Take 10 mg by mouth 3 (three) times daily.    . diclofenac sodium (VOLTAREN) 1 % GEL Apply 1 application topically daily as needed.    . DULoxetine (CYMBALTA) 60 MG capsule Take 1 capsule by mouth 2 (two) times daily.    Marland Kitchen DYMISTA 137-50 MCG/ACT SUSP One spray each nostril twice a day. 23 g 3  . fenofibrate 160 MG tablet Take 1 tablet (160 mg total) by mouth daily. 90 tablet 1  . gabapentin (NEURONTIN) 600 MG tablet Take 1 tablet by mouth 4 (four) times daily.    . hydrocortisone (CORTEF) 20 MG tablet Take 1 tablet in the morning and 1/2 tablet at 4pm    . levocetirizine (XYZAL) 5 MG tablet TAKE 1 TABLET BY MOUTH EVERY DAY IN THE EVENING 90 tablet 2  . levothyroxine (SYNTHROID, LEVOTHROID) 50 MCG tablet Take 1 tablet by mouth every morning.    . lubiprostone (AMITIZA) 8 MCG capsule TAKE 1 CAPSULE (8 MCG TOTAL) BY MOUTH 2 (TWO) TIMES DAILY WITH A MEAL. 60 capsule 1  . montelukast (SINGULAIR) 10 MG tablet Take 1 tablet (10 mg total) by mouth daily. 90 tablet 3  .  Multiple Vitamin (MULTI-VITAMINS) TABS Take 1 tablet by mouth daily.    . naloxone (NARCAN) nasal spray 4 mg/0.1 mL Place 1 spray into the nose once. (for suspected overdose)    . olopatadine (PATANOL) 0.1 % ophthalmic solution Use 1 drop in each eye twice daily as needed 5 mL 3  . omeprazole (PRILOSEC) 40 MG capsule Take 1 capsule (40 mg total) by mouth 2 (two) times daily. 180 capsule 3  . oxyCODONE-acetaminophen (PERCOCET) 10-325 MG tablet TAKE 1 TAB EVERY 6 HOURS FOR 5 DAYS THEN 1 TAB TWICE A DAY AS NEEDED FOR SEVERE BREAKTHROUGH PAIN  0  . phenazopyridine (PYRIDIUM) 200 MG tablet Take 1 tablet (200 mg total) by mouth 3 (three)  times daily as needed for pain. 15 tablet 0  . primidone (MYSOLINE) 50 MG tablet TAKE 1 TABLET BY MOUTH TWICE A DAY 60 tablet 5  . sulfamethoxazole-trimethoprim (BACTRIM DS) 800-160 MG tablet Take 1 tablet by mouth 2 (two) times daily. 14 tablet 0  . Tiotropium Bromide Monohydrate (SPIRIVA RESPIMAT) 2.5 MCG/ACT AERS Inhale into the lungs.    . Vitamin D, Ergocalciferol, (DRISDOL) 50000 units CAPS capsule Take 1 capsule by mouth once a week.    Marland Kitchen ZTLIDO 1.8 % PTCH SMARTSIG:1 Patch(s) Topical As Needed     No current facility-administered medications on file prior to visit.   Past Medical History:  Diagnosis Date  . Adrenal insufficiency ([place])   . Arthritis   . Carpal tunnel syndrome    Both hands, worse in right  . Chronic constipation   . COPD (chronic obstructive pulmonary disease) ([place])   . CVA (cerebral vascular accident) ([place])   . Facet syndrome   . Fibromyalgia   . GERD (gastroesophageal reflux disease)   . Heart murmur   . Hypercholesteremia   . IBS (irritable bowel syndrome)   . Ischemic stroke ([place])    07/19/2004  . Lumbar radiculopathy   . Osteoporosis   . RLS (restless legs syndrome)   . Spinal stenosis   . TMJ (temporomandibular joint disorder)   . Trochanteric bursitis of both hips   . Vascular malformation    spinal cord   Past Surgical History:  Procedure Laterality Date  . APPENDECTOMY    . CARPAL TUNNEL RELEASE Right 12/13/2017   surgery  . CHOLECYSTECTOMY    . COLONOSCOPY  05/06/2017   Colonic polyps status post polypectomy. Interan land external hemorrhoids.   . ESOPHAGOGASTRODUODENOSCOPY  04/28/2007   Mild gastrtiis. Otherwise normal EGD.   Marland Kitchen HEMORROIDECTOMY    . LAMINECTOMY AND MICRODISCECTOMY LUMBAR SPINE  01/03/2005  . NECK SURGERY     x2   . OTHER SURGICAL HISTORY  04/24/2004   anterior cervical discectomy and fusion at C5-C6  . TONSILLECTOMY    . TOTAL ABDOMINAL HYSTERECTOMY      Family History  Problem Relation Age of Onset  .  Supraventricular tachycardia Mother   . Dementia Mother   . Hyperlipidemia Mother   . Hypertension Mother   . Osteoarthritis Mother   . Osteoporosis Mother   . Breast cancer Other   . Breast cancer Other   . Colon cancer Neg Hx   . Esophageal cancer Neg Hx    Social History   Socioeconomic History  . Marital status: Divorced    Spouse name: Not on file  . Number of children: 1  . Years of education: Not on file  . Highest education level: Not on file  Occupational History  . Occupation: Disabled  Tobacco Use  . Smoking status: Current Every Day Smoker    Packs/day: 1.50    Types: Cigarettes  . Smokeless tobacco: Never Used  Vaping Use  . Vaping Use: Never used  Substance and Sexual Activity  . Alcohol use: Not Currently    Comment: quit 19 years  . Drug use: Never  . Sexual activity: Not on file  Other Topics Concern  . Not on file  Social History Narrative  . Not on file   Social Determinants of Health   Financial Resource Strain: Not on file  Food Insecurity: Not on file  Transportation Needs: Not on file  Physical Activity: Not on file  Stress: Not on file  Social Connections: Not on file    Review of Systems  Constitutional: Positive for fatigue. Negative for chills and fever.  HENT: Negative for congestion, ear pain and sore throat.   Respiratory: Positive for cough. Negative for shortness of breath.   Cardiovascular: Negative for chest pain and palpitations (Sometimes).  Gastrointestinal: Negative for abdominal pain, constipation, diarrhea, nausea and vomiting.  Endocrine: Negative for polydipsia, polyphagia and polyuria.  Genitourinary: Positive for dysuria, frequency and urgency. Negative for difficulty urinating.  Musculoskeletal: Negative for arthralgias, back pain and myalgias.  Skin: Negative for rash.  Psychiatric/Behavioral: Negative for dysphoric mood. The patient is not nervous/anxious.      Objective:  BP 108/72   Pulse (!) 31   Temp  (!) 96.3 F (35.7 C)   Ht 5\' 3"  (1.6 m)   Wt 142 lb (64.4 kg)   SpO2 (!) 35%   BMI 25.15 kg/m   BP/Weight 07/26/2020 07/04/2020 06/27/8655  Systolic BP 846 962 952  Diastolic BP 72 62 78  Wt. (Lbs) 142 143 138  BMI 25.15 25.33 24.45    Physical Exam Vitals reviewed.  Constitutional:      Appearance: Normal appearance.  Cardiovascular:     Rate and Rhythm: Normal rate and regular rhythm.     Heart sounds: Normal heart sounds.  Pulmonary:     Effort: Pulmonary effort is normal.     Breath sounds: Wheezing (bases BL. ) present.     Comments: Improved.  Abdominal:     General: Bowel sounds are normal.     Palpations: Abdomen is soft.     Tenderness: There is no abdominal tenderness.  Neurological:     Mental Status: She is alert and oriented to person, place, and time.  Psychiatric:        Mood and Affect: Mood normal.        Behavior: Behavior normal.     Diabetic Foot Exam - Simple   No data filed      Lab Results  Component Value Date   WBC 11.6 (H) 04/26/2020   HGB 14.5 04/26/2020   HCT 42.9 04/26/2020   PLT 411 04/26/2020   GLUCOSE 91 04/26/2020   CHOL 194 04/26/2020   TRIG 122 04/26/2020   HDL 92 04/26/2020   LDLCALC 81 04/26/2020   ALT 21 04/26/2020   AST 17 04/26/2020   NA 143 04/26/2020   K 4.4 04/26/2020   CL 102 04/26/2020   CREATININE 0.79 04/26/2020   BUN 12 04/26/2020   CO2 28 04/26/2020   TSH 0.902 01/15/2020      Assessment & Plan:   1. Mixed hyperlipidemia  Well controlled.  No changes to medicines.  Continue to work on eating a healthy diet and exercise.  Labs drawn today.  - Comprehensive metabolic  panel - Lipid panel - CBC with Differential/Platelet  2. Acquired hypothyroidism - TSH abnormal. Add free T4.  3. Chronic pain syndrome Management per specialist. Just had ESIs yesterday.   4. Cigarette nicotine dependence with other nicotine-induced disorder Recommend continue weaning.   5. Mild recurrent major depression  ([place]) The current medical regimen is effective;  continue present plan and medications.  6. Simple chronic bronchitis ([place]) The current medical regimen is effective;  continue present plan and medications.  7. Adrenal insufficiency (Addison's disease) ([place]) Follow up with endocrinology.   8. Age-related osteoporosis without current pathological fracture Continue alendronate, vitamin D.  9. Vitamin D deficiency - VITAMIN D 25 Hydroxy (Vit-D Deficiency, Fractures)  10. Dysuria - Ambulatory referral to Urology Urinalysis: no cells. No bacteria.    Orders Placed This Encounter  Procedures  . Comprehensive metabolic panel  . Lipid panel  . TSH  . CBC with Differential/Platelet  . VITAMIN D 25 Hydroxy (Vit-D Deficiency, Fractures)  . Ambulatory referral to Urology    Follow-up: Return in about 3 months (around 10/26/2020).  An After Visit Summary was printed and given to the patient.  [name], MD [facility] [phone]

## 2020-07-26 ENCOUNTER — Ambulatory Visit (INDEPENDENT_AMBULATORY_CARE_PROVIDER_SITE_OTHER): Payer: Medicare Other | Admitting: Family Medicine

## 2020-07-26 ENCOUNTER — Encounter: Payer: Self-pay | Admitting: Family Medicine

## 2020-07-26 ENCOUNTER — Other Ambulatory Visit: Payer: Self-pay

## 2020-07-26 VITALS — BP 108/72 | HR 31 | Temp 96.3°F | Ht 63.0 in | Wt 142.0 lb

## 2020-07-26 DIAGNOSIS — M810 Age-related osteoporosis without current pathological fracture: Secondary | ICD-10-CM

## 2020-07-26 DIAGNOSIS — F17218 Nicotine dependence, cigarettes, with other nicotine-induced disorders: Secondary | ICD-10-CM | POA: Diagnosis not present

## 2020-07-26 DIAGNOSIS — E782 Mixed hyperlipidemia: Secondary | ICD-10-CM

## 2020-07-26 DIAGNOSIS — G894 Chronic pain syndrome: Secondary | ICD-10-CM

## 2020-07-26 DIAGNOSIS — R946 Abnormal results of thyroid function studies: Secondary | ICD-10-CM | POA: Diagnosis not present

## 2020-07-26 DIAGNOSIS — J410 Simple chronic bronchitis: Secondary | ICD-10-CM

## 2020-07-26 DIAGNOSIS — E039 Hypothyroidism, unspecified: Secondary | ICD-10-CM | POA: Diagnosis not present

## 2020-07-26 DIAGNOSIS — F330 Major depressive disorder, recurrent, mild: Secondary | ICD-10-CM

## 2020-07-26 DIAGNOSIS — R300 Dysuria: Secondary | ICD-10-CM

## 2020-07-26 DIAGNOSIS — F322 Major depressive disorder, single episode, severe without psychotic features: Secondary | ICD-10-CM

## 2020-07-26 DIAGNOSIS — E559 Vitamin D deficiency, unspecified: Secondary | ICD-10-CM

## 2020-07-26 DIAGNOSIS — E271 Primary adrenocortical insufficiency: Secondary | ICD-10-CM | POA: Diagnosis not present

## 2020-07-26 DIAGNOSIS — F418 Other specified anxiety disorders: Secondary | ICD-10-CM

## 2020-07-27 ENCOUNTER — Other Ambulatory Visit: Payer: Self-pay | Admitting: Family Medicine

## 2020-07-27 LAB — CBC WITH DIFFERENTIAL/PLATELET
Basophils Absolute: 0.1 10*3/uL (ref 0.0–0.2)
Basos: 0 %
EOS (ABSOLUTE): 0 10*3/uL (ref 0.0–0.4)
Eos: 0 %
Hematocrit: 39.8 % (ref 34.0–46.6)
Hemoglobin: 13.2 g/dL (ref 11.1–15.9)
Immature Grans (Abs): 0.1 10*3/uL (ref 0.0–0.1)
Immature Granulocytes: 1 %
Lymphocytes Absolute: 2.1 10*3/uL (ref 0.7–3.1)
Lymphs: 13 %
MCH: 30.3 pg (ref 26.6–33.0)
MCHC: 33.2 g/dL (ref 31.5–35.7)
MCV: 91 fL (ref 79–97)
Monocytes Absolute: 0.9 10*3/uL (ref 0.1–0.9)
Monocytes: 6 %
Neutrophils Absolute: 12.5 10*3/uL — ABNORMAL HIGH (ref 1.4–7.0)
Neutrophils: 80 %
Platelets: 482 10*3/uL — ABNORMAL HIGH (ref 150–450)
RBC: 4.36 x10E6/uL (ref 3.77–5.28)
RDW: 13.6 % (ref 11.7–15.4)
WBC: 15.6 10*3/uL — ABNORMAL HIGH (ref 3.4–10.8)

## 2020-07-27 LAB — COMPREHENSIVE METABOLIC PANEL
ALT: 19 IU/L (ref 0–32)
AST: 18 IU/L (ref 0–40)
Albumin/Globulin Ratio: 2.1 (ref 1.2–2.2)
Albumin: 4.4 g/dL (ref 3.8–4.8)
Alkaline Phosphatase: 37 IU/L — ABNORMAL LOW (ref 44–121)
BUN/Creatinine Ratio: 15 (ref 12–28)
BUN: 12 mg/dL (ref 8–27)
Bilirubin Total: 0.2 mg/dL (ref 0.0–1.2)
CO2: 24 mmol/L (ref 20–29)
Calcium: 9.6 mg/dL (ref 8.7–10.3)
Chloride: 104 mmol/L (ref 96–106)
Creatinine, Ser: 0.8 mg/dL (ref 0.57–1.00)
Globulin, Total: 2.1 g/dL (ref 1.5–4.5)
Glucose: 103 mg/dL — ABNORMAL HIGH (ref 65–99)
Potassium: 4.2 mmol/L (ref 3.5–5.2)
Sodium: 144 mmol/L (ref 134–144)
Total Protein: 6.5 g/dL (ref 6.0–8.5)
eGFR: 81 mL/min/{1.73_m2} (ref >59)

## 2020-07-27 LAB — VITAMIN D 25 HYDROXY (VIT D DEFICIENCY, FRACTURES): Vit D, 25-Hydroxy: 55 ng/mL (ref 30.0–100.0)

## 2020-07-27 LAB — LIPID PANEL
Chol/HDL Ratio: 2.2 ratio (ref 0.0–4.4)
Cholesterol, Total: 183 mg/dL (ref 100–199)
HDL: 82 mg/dL (ref >39)
LDL Chol Calc (NIH): 83 mg/dL (ref 0–99)
Triglycerides: 102 mg/dL (ref 0–149)
VLDL Cholesterol Cal: 18 mg/dL (ref 5–40)

## 2020-07-27 LAB — CARDIOVASCULAR RISK ASSESSMENT

## 2020-07-27 LAB — TSH: TSH: 0.335 u[IU]/mL — ABNORMAL LOW (ref 0.450–4.500)

## 2020-07-28 ENCOUNTER — Other Ambulatory Visit: Payer: Self-pay

## 2020-07-28 DIAGNOSIS — D729 Disorder of white blood cells, unspecified: Secondary | ICD-10-CM

## 2020-07-28 DIAGNOSIS — N39498 Other specified urinary incontinence: Secondary | ICD-10-CM

## 2020-08-01 LAB — T4, FREE: Free T4: 1.33 ng/dL (ref 0.82–1.77)

## 2020-08-01 LAB — SPECIMEN STATUS REPORT

## 2020-08-03 ENCOUNTER — Ambulatory Visit (INDEPENDENT_AMBULATORY_CARE_PROVIDER_SITE_OTHER): Payer: Medicare Other

## 2020-08-03 ENCOUNTER — Other Ambulatory Visit: Payer: Self-pay

## 2020-08-03 ENCOUNTER — Encounter: Payer: Self-pay | Admitting: Sports Medicine

## 2020-08-03 ENCOUNTER — Ambulatory Visit (INDEPENDENT_AMBULATORY_CARE_PROVIDER_SITE_OTHER): Payer: Medicare Other | Admitting: Sports Medicine

## 2020-08-03 DIAGNOSIS — M19071 Primary osteoarthritis, right ankle and foot: Secondary | ICD-10-CM

## 2020-08-03 DIAGNOSIS — M792 Neuralgia and neuritis, unspecified: Secondary | ICD-10-CM | POA: Diagnosis not present

## 2020-08-03 DIAGNOSIS — M79671 Pain in right foot: Secondary | ICD-10-CM | POA: Diagnosis not present

## 2020-08-03 DIAGNOSIS — S92211D Displaced fracture of cuboid bone of right foot, subsequent encounter for fracture with routine healing: Secondary | ICD-10-CM | POA: Diagnosis not present

## 2020-08-03 DIAGNOSIS — M5386 Other specified dorsopathies, lumbar region: Secondary | ICD-10-CM

## 2020-08-03 NOTE — Progress Notes (Signed)
Subjective: Connie Coffey is a 65 y.o. female patient who returns office for follow-up evaluation of right foot pain secondary to fracture.  Patient reports that her foot is doing well no pain at this area but is having issues with her back recently had a cyst drained an injection with no improvement in her back.  Patient denies any other issues or pedal complaints at this time.  Patient Active Problem List   Diagnosis Date Noted  . Simple chronic bronchitis ([place]) 02/16/2020  . Acute sinusitis 12/29/2019  . Tick bite of left side of chest wall 11/10/2019  . Mixed hyperlipidemia 10/19/2019  . Adrenal insufficiency (Addison's disease) ([place]) 10/19/2019  . Obstructive chronic bronchitis with exacerbation ([place]) 09/08/2019  . Sciatica of right side associated with disorder of lumbar spine 07/18/2019  . Acute serous otitis media of left ear 07/18/2019  . History of colonic polyps 02/16/2019  . History of hyperparathyroidism 12/22/2018  . Lung nodules 07/18/2018  . Centrilobular emphysema ([place]) 03/11/2018  . Cigarette smoker 03/11/2018  . Chronic pain syndrome 04/15/2017  . Depression with anxiety 03/21/2017  . Age-related osteoporosis without current pathological fracture 01/18/2017  . Acquired hypothyroidism 09/22/2015  . Hyperparathyroidism ([place]) 09/22/2015  . Vitamin D deficiency 09/22/2015  . Anxiety state 08/06/2013  . Left carpal tunnel syndrome 06/04/2013  . Neck pain 01/21/2013  . Low back pain 11/21/2012  . Chronic airway obstruction ([place]) 11/01/2012    Current Outpatient Medications on File Prior to Visit  Medication Sig Dispense Refill  . nitrofurantoin, macrocrystal-monohydrate, (MACROBID) 100 MG capsule Take 1 capsule (100 mg total) by mouth 2 (two) times daily. 14 capsule 0  . albuterol (VENTOLIN HFA) 108 (90 Base) MCG/ACT inhaler Inhale into the lungs.    Marland Kitchen alendronate (FOSAMAX) 70 MG tablet Take 1 tablet by mouth once a week.    Marland Kitchen atorvastatin (LIPITOR) 40 MG tablet TAKE 1  TABLET BY MOUTH  DAILY 90 tablet 3  . busPIRone (BUSPAR) 30 MG tablet Take 1 tablet (30 mg total) by mouth 2 (two) times daily. 180 tablet 1  . clonazePAM (KLONOPIN) 0.5 MG tablet TAKE 1 TABLET BY MOUTH TWICE A DAY AS NEEDED FOR ANXIETY *05/28/20* 60 tablet 1  . cyclobenzaprine (FLEXERIL) 10 MG tablet Take 10 mg by mouth 3 (three) times daily.    . diclofenac sodium (VOLTAREN) 1 % GEL Apply 1 application topically daily as needed.    . DULoxetine (CYMBALTA) 60 MG capsule Take 1 capsule by mouth 2 (two) times daily.    Marland Kitchen DYMISTA 137-50 MCG/ACT SUSP One spray each nostril twice a day. 23 g 3  . fenofibrate 160 MG tablet Take 1 tablet (160 mg total) by mouth daily. 90 tablet 1  . gabapentin (NEURONTIN) 600 MG tablet Take 1 tablet by mouth 4 (four) times daily.    . hydrocortisone (CORTEF) 20 MG tablet Take 1 tablet in the morning and 1/2 tablet at 4pm    . levocetirizine (XYZAL) 5 MG tablet TAKE 1 TABLET BY MOUTH EVERY DAY IN THE EVENING 90 tablet 2  . levothyroxine (SYNTHROID, LEVOTHROID) 50 MCG tablet Take 1 tablet by mouth every morning.    . lubiprostone (AMITIZA) 8 MCG capsule TAKE 1 CAPSULE (8 MCG TOTAL) BY MOUTH 2 (TWO) TIMES DAILY WITH A MEAL. 60 capsule 1  . montelukast (SINGULAIR) 10 MG tablet Take 1 tablet (10 mg total) by mouth daily. 90 tablet 3  . Multiple Vitamin (MULTI-VITAMINS) TABS Take 1 tablet by mouth daily.    Marland Kitchen  naloxone (NARCAN) nasal spray 4 mg/0.1 mL Place 1 spray into the nose once. (for suspected overdose)    . olopatadine (PATANOL) 0.1 % ophthalmic solution Use 1 drop in each eye twice daily as needed 5 mL 3  . omeprazole (PRILOSEC) 40 MG capsule Take 1 capsule (40 mg total) by mouth 2 (two) times daily. 180 capsule 3  . oxyCODONE-acetaminophen (PERCOCET) 10-325 MG tablet TAKE 1 TAB EVERY 6 HOURS FOR 5 DAYS THEN 1 TAB TWICE A DAY AS NEEDED FOR SEVERE BREAKTHROUGH PAIN  0  . phenazopyridine (PYRIDIUM) 200 MG tablet Take 1 tablet (200 mg total) by mouth 3 (three) times daily  as needed for pain. 15 tablet 0  . primidone (MYSOLINE) 50 MG tablet TAKE 1 TABLET BY MOUTH TWICE A DAY 60 tablet 5  . sulfamethoxazole-trimethoprim (BACTRIM DS) 800-160 MG tablet Take 1 tablet by mouth 2 (two) times daily. 14 tablet 0  . Tiotropium Bromide Monohydrate (SPIRIVA RESPIMAT) 2.5 MCG/ACT AERS Inhale into the lungs.    . Vitamin D, Ergocalciferol, (DRISDOL) 50000 units CAPS capsule Take 1 capsule by mouth once a week.    Marland Kitchen ZTLIDO 1.8 % PTCH SMARTSIG:1 Patch(s) Topical As Needed     No current facility-administered medications on file prior to visit.    Allergies  Allergen Reactions  . Celebrex [Celecoxib]     "because of stomach ulcers"  . Chantix [Varenicline]   . Fish Oil Other (See Comments)    hemorrhoids  . Nsaids Other (See Comments)    GI Upset. Ulcers  . Other     beets  . Nicotrol [Nicotine] Nausea Only  . Penicillins Rash    Objective:  General: Alert and oriented x3 in no acute distress  Dermatology: No open lesions bilateral lower extremities, no webspace macerations, no ecchymosis bilateral, all nails x 10 are well manicured.  Vascular: No edema noted to the right foot PT and DP pedal pulses 1 out of 4 bilateral.  Capillary Fill Time 5 seconds, scant pedal hair growth bilateral, Temperature gradient within normal limits.  Neurology: Johney Maine sensation intact via light touch bilateral, subjective sharp shooting pains to the right foot history of sciatica unchanged from prior.  Musculoskeletal: There is no tenderness to palpation at tendon course and at the fifth metatarsal base and cuboid joint.  There is no guarding with range of motion noted on the right.  Strength 4 out of 5 on right 5 out of 5 on left likely secondary to sciatica.  Cane assisted gait.  Xrays  Right foot   Impression: 100% healed fracture with some arthritis noted at the fifth metatarsal base and calcaneal cuboid joint. There is mild soft tissue swelling.  No other acute findings  noted.    Assessment and Plan: Problem List Items Addressed This Visit      Nervous and Auditory   Sciatica of right side associated with disorder of lumbar spine    Other Visit Diagnoses    Closed displaced fracture of cuboid of right foot with routine healing, subsequent encounter    -  Primary   healed   Relevant Orders   DG Foot Complete Right   Right foot pain       Neuritis       Arthritis of right foot         -Complete examination performed -Xrays reviewed -Re-Discussed continue care for now healed fracture -Advised patient that she may slowly wean from ankle gauntlet brace however on Cardinal walks or stand may  want to occasionally use it for extra protection and stability -Continue with follow-up with back doctor like previous -Continue with cane to provide additional stability in gait or walker like previous  -Patient to return to office as needed or sooner if problems or issues arise.  Landis Martins, DPM

## 2020-08-08 ENCOUNTER — Telehealth: Payer: Self-pay | Admitting: Oncology

## 2020-08-08 NOTE — Telephone Encounter (Signed)
Patient referred by Dr [name] for Abnormal WBC (Located in [facility] Chart -Epic).  Appt made for 08/23/20 Labs 10:30 am - Consult 11:00 am

## 2020-08-10 ENCOUNTER — Telehealth: Payer: Self-pay | Admitting: Family Medicine

## 2020-08-10 NOTE — Progress Notes (Signed)
  Chronic Care Management   Note  08/10/2020 Name: YUVIA PLANT MRN: [id number] DOB: 01-24-1956  Vangie Bicker Chohan is a 65 y.o. year old female who is a primary care patient of Cox, Kirsten, MD. I reached out to [employer] by phone today in response to a referral sent by Ms. Renelda Mom PCP, Cox, Kirsten, MD.   Ms. Kothari was given information about Chronic Care Management services today including:  1. CCM service includes personalized support from designated clinical staff supervised by her physician, including individualized plan of care and coordination with other care providers 2. 24/7 contact phone numbers for assistance for urgent and routine care needs. 3. Service will only be billed when office clinical staff spend 20 minutes or more in a month to coordinate care. 4. Only one practitioner may furnish and bill the service in a calendar month. 5. The patient may stop CCM services at any time (effective at the end of the month) by phone call to the office staff.   Patient agreed to services and verbal consent obtained.   Follow up plan:   Carley Perdue [employer] Scheduler

## 2020-08-19 ENCOUNTER — Other Ambulatory Visit: Payer: Self-pay | Admitting: Family Medicine

## 2020-08-22 ENCOUNTER — Other Ambulatory Visit: Payer: Self-pay | Admitting: Oncology

## 2020-08-22 DIAGNOSIS — D72829 Elevated white blood cell count, unspecified: Secondary | ICD-10-CM

## 2020-08-22 NOTE — Progress Notes (Deleted)
[facility]  [address] [place],  [place]  [address] [phone]  Clinic Day:  08/22/2020  Referring physician: [name], MD   HISTORY OF PRESENT ILLNESS:  The patient is a 65 y.o. female  *** who I was asked to consult upon for leukocytosis.  Labs showed an elevated white count of 15.6.    PAST MEDICAL HISTORY:   Past Medical History:  Diagnosis Date  . Adrenal insufficiency ([place])   . Arthritis   . Carpal tunnel syndrome    Both hands, worse in right  . Chronic constipation   . COPD (chronic obstructive pulmonary disease) ([place])   . CVA (cerebral vascular accident) ([place])   . Facet syndrome   . Fibromyalgia   . GERD (gastroesophageal reflux disease)   . Heart murmur   . Hypercholesteremia   . IBS (irritable bowel syndrome)   . Ischemic stroke ([place])    07/19/2004  . Lumbar radiculopathy   . Osteoporosis   . RLS (restless legs syndrome)   . Spinal stenosis   . TMJ (temporomandibular joint disorder)   . Trochanteric bursitis of both hips   . Vascular malformation    spinal cord    PAST SURGICAL HISTORY:   Past Surgical History:  Procedure Laterality Date  . APPENDECTOMY    . CARPAL TUNNEL RELEASE Right 12/13/2017   surgery  . CHOLECYSTECTOMY    . COLONOSCOPY  05/06/2017   Colonic polyps status post polypectomy. Interan land external hemorrhoids.   . ESOPHAGOGASTRODUODENOSCOPY  04/28/2007   Mild gastrtiis. Otherwise normal EGD.   Marland Kitchen HEMORROIDECTOMY    . LAMINECTOMY AND MICRODISCECTOMY LUMBAR SPINE  01/03/2005  . NECK SURGERY     x2   . OTHER SURGICAL HISTORY  04/24/2004   anterior cervical discectomy and fusion at C5-C6  . TONSILLECTOMY    . TOTAL ABDOMINAL HYSTERECTOMY      CURRENT MEDICATIONS:   Current Outpatient Medications  Medication Sig Dispense Refill  . nitrofurantoin, macrocrystal-monohydrate, (MACROBID) 100 MG capsule Take 1 capsule (100 mg total) by mouth 2 (two) times daily. 14 capsule 0  . albuterol  (VENTOLIN HFA) 108 (90 Base) MCG/ACT inhaler Inhale into the lungs.    Marland Kitchen alendronate (FOSAMAX) 70 MG tablet Take 1 tablet by mouth once a week.    Marland Kitchen atorvastatin (LIPITOR) 40 MG tablet TAKE 1 TABLET BY MOUTH  DAILY 90 tablet 3  . busPIRone (BUSPAR) 30 MG tablet Take 1 tablet (30 mg total) by mouth 2 (two) times daily. 180 tablet 1  . clonazePAM (KLONOPIN) 0.5 MG tablet TAKE 1 TABLET BY MOUTH TWICE A DAY AS NEEDED FOR ANXIETY *05/28/20* 60 tablet 1  . cyclobenzaprine (FLEXERIL) 10 MG tablet Take 10 mg by mouth 3 (three) times daily.    . diclofenac sodium (VOLTAREN) 1 % GEL Apply 1 application topically daily as needed.    . DULoxetine (CYMBALTA) 60 MG capsule Take 1 capsule by mouth 2 (two) times daily.    Marland Kitchen DYMISTA 137-50 MCG/ACT SUSP One spray each nostril twice a day. 23 g 3  . fenofibrate 160 MG tablet Take 1 tablet (160 mg total) by mouth daily. 90 tablet 1  . gabapentin (NEURONTIN) 600 MG tablet Take 1 tablet by mouth 4 (four) times daily.    . hydrocortisone (CORTEF) 20 MG tablet Take 1 tablet in the morning and 1/2 tablet at 4pm    . levocetirizine (XYZAL) 5 MG tablet TAKE 1 TABLET BY MOUTH EVERY DAY IN THE EVENING  90 tablet 2  . levothyroxine (SYNTHROID, LEVOTHROID) 50 MCG tablet Take 1 tablet by mouth every morning.    . lubiprostone (AMITIZA) 8 MCG capsule TAKE 1 CAPSULE (8 MCG TOTAL) BY MOUTH 2 (TWO) TIMES DAILY WITH A MEAL. 60 capsule 1  . montelukast (SINGULAIR) 10 MG tablet Take 1 tablet (10 mg total) by mouth daily. 90 tablet 3  . Multiple Vitamin (MULTI-VITAMINS) TABS Take 1 tablet by mouth daily.    . naloxone (NARCAN) nasal spray 4 mg/0.1 mL Place 1 spray into the nose once. (for suspected overdose)    . olopatadine (PATANOL) 0.1 % ophthalmic solution Use 1 drop in each eye twice daily as needed 5 mL 3  . omeprazole (PRILOSEC) 40 MG capsule TAKE 1 CAPSULE BY MOUTH  TWICE DAILY 180 capsule 3  . oxyCODONE-acetaminophen (PERCOCET) 10-325 MG tablet TAKE 1 TAB EVERY 6 HOURS FOR 5 DAYS  THEN 1 TAB TWICE A DAY AS NEEDED FOR SEVERE BREAKTHROUGH PAIN  0  . phenazopyridine (PYRIDIUM) 200 MG tablet Take 1 tablet (200 mg total) by mouth 3 (three) times daily as needed for pain. 15 tablet 0  . primidone (MYSOLINE) 50 MG tablet TAKE 1 TABLET BY MOUTH TWICE A DAY 60 tablet 5  . sulfamethoxazole-trimethoprim (BACTRIM DS) 800-160 MG tablet Take 1 tablet by mouth 2 (two) times daily. 14 tablet 0  . Tiotropium Bromide Monohydrate (SPIRIVA RESPIMAT) 2.5 MCG/ACT AERS Inhale into the lungs.    . Vitamin D, Ergocalciferol, (DRISDOL) 50000 units CAPS capsule Take 1 capsule by mouth once a week.    Marland Kitchen ZTLIDO 1.8 % PTCH SMARTSIG:1 Patch(s) Topical As Needed     No current facility-administered medications for this visit.    ALLERGIES:   Allergies  Allergen Reactions  . Celebrex [Celecoxib]     "because of stomach ulcers"  . Chantix [Varenicline]   . Fish Oil Other (See Comments)    hemorrhoids  . Nsaids Other (See Comments)    GI Upset. Ulcers  . Other     beets  . Nicotrol [Nicotine] Nausea Only  . Penicillins Rash    FAMILY HISTORY:   Family History  Problem Relation Age of Onset  . Supraventricular tachycardia Mother   . Dementia Mother   . Hyperlipidemia Mother   . Hypertension Mother   . Osteoarthritis Mother   . Osteoporosis Mother   . Breast cancer Other   . Breast cancer Other   . Colon cancer Neg Hx   . Esophageal cancer Neg Hx     SOCIAL HISTORY:   reports that she has been smoking cigarettes. She has been smoking about 1.50 packs per day. She has never used smokeless tobacco. She reports previous alcohol use. She reports that she does not use drugs.  REVIEW OF SYSTEMS:  Review of Systems - Oncology   PHYSICAL EXAM:  There were no vitals taken for this visit. Wt Readings from Last 3 Encounters:  07/26/20 142 lb (64.4 kg)  07/04/20 143 lb (64.9 kg)  06/08/20 138 lb (62.6 kg)   There is no height or weight on file to calculate BMI. Performance status  (ECOG): {CHL ONC [id number] Physical Exam .phy  LABS:   CBC Latest Ref Rng & Units 07/26/2020 04/26/2020 01/15/2020  WBC 3.4 - 10.8 x10E3/uL 15.6(H) 11.6(H) 13.4(H)  Hemoglobin 11.1 - 15.9 g/dL 13.2 14.5 14.4  Hematocrit 34.0 - 46.6 % 39.8 42.9 43.5  Platelets 150 - 450 x10E3/uL 482(H) 411 435   CMP Latest Ref Rng & Units  07/26/2020 04/26/2020 01/15/2020  Glucose 65 - 99 mg/dL 103(H) 91 81  BUN 8 - 27 mg/dL 12 12 7(L)  Creatinine 0.57 - 1.00 mg/dL 0.80 0.79 0.77  Sodium 134 - 144 mmol/L 144 143 144  Potassium 3.5 - 5.2 mmol/L 4.2 4.4 3.5  Chloride 96 - 106 mmol/L 104 102 103  CO2 20 - 29 mmol/L 24 28 27   Calcium 8.7 - 10.3 mg/dL 9.6 9.9 8.9  Total Protein 6.0 - 8.5 g/dL 6.5 6.2 6.2  Total Bilirubin 0.0 - 1.2 mg/dL 0.2 0.3 0.3  Alkaline Phos 44 - 121 IU/L 37(L) 38(L) 36(L)  AST 0 - 40 IU/L 18 17 15   ALT 0 - 32 IU/L 19 21 14      No results found for: CEA1 / No results found for: CEA1 No results found for: PSA1 No results found for: GQQ761 No results found for: PJK932  No results found for: TOTALPROTELP, ALBUMINELP, A1GS, A2GS, BETS, BETA2SER, GAMS, MSPIKE, SPEI No results found for: TIBC, FERRITIN, IRONPCTSAT No results found for: LDH  No results found for: AFPTUMOR, TOTALPROTELP, ALBUMINELP, A1GS, A2GS, BETS, BETA2SER, GAMS, MSPIKE, SPEI, LDH, CEA1, PSA1, IGASERUM, IGGSERUM, IGMSERUM, THGAB, THYROGLB  Recent Review Flowsheet Data   There is no flowsheet data to display.     STUDIES:  DG Foot Complete Right  Result Date: 08/03/2020 Please see detailed radiograph report in office note.    ASSESSMENT & PLAN:  A 65 y.o. female who I was asked to consult upon for *** .The patient understands all the plans discussed today and is in agreement with them.  I do appreciate Cox, Kirsten, MD for his new consult.   Aristea Posada Macarthur Critchley, MD

## 2020-08-23 ENCOUNTER — Inpatient Hospital Stay: Payer: Medicare Other | Attending: Oncology

## 2020-08-23 ENCOUNTER — Inpatient Hospital Stay: Payer: Medicare Other | Admitting: Oncology

## 2020-08-31 ENCOUNTER — Ambulatory Visit (INDEPENDENT_AMBULATORY_CARE_PROVIDER_SITE_OTHER): Payer: Medicare Other | Admitting: Nurse Practitioner

## 2020-08-31 ENCOUNTER — Encounter: Payer: Self-pay | Admitting: Nurse Practitioner

## 2020-08-31 ENCOUNTER — Telehealth: Payer: Self-pay

## 2020-08-31 ENCOUNTER — Other Ambulatory Visit: Payer: Self-pay

## 2020-08-31 VITALS — BP 110/82 | HR 104 | Temp 97.7°F | Ht 63.0 in | Wt 138.0 lb

## 2020-08-31 DIAGNOSIS — N6310 Unspecified lump in the right breast, unspecified quadrant: Secondary | ICD-10-CM

## 2020-08-31 DIAGNOSIS — R000 Tachycardia, unspecified: Secondary | ICD-10-CM | POA: Diagnosis not present

## 2020-08-31 DIAGNOSIS — N3001 Acute cystitis with hematuria: Secondary | ICD-10-CM

## 2020-08-31 DIAGNOSIS — K529 Noninfective gastroenteritis and colitis, unspecified: Secondary | ICD-10-CM | POA: Diagnosis not present

## 2020-08-31 DIAGNOSIS — R34 Anuria and oliguria: Secondary | ICD-10-CM

## 2020-08-31 DIAGNOSIS — R300 Dysuria: Secondary | ICD-10-CM | POA: Diagnosis not present

## 2020-08-31 LAB — POCT URINALYSIS DIPSTICK
Bilirubin, UA: NEGATIVE
Glucose, UA: NEGATIVE
Ketones, UA: NEGATIVE
Nitrite, UA: POSITIVE
Protein, UA: POSITIVE — AB
Spec Grav, UA: 1.01 (ref 1.010–1.025)
Urobilinogen, UA: NEGATIVE E.U./dL — AB
pH, UA: 6.5 (ref 5.0–8.0)

## 2020-08-31 MED ORDER — ONDANSETRON 4 MG PO TBDP: 4.0000 mg | ORAL_TABLET | Freq: Three times a day (TID) | ORAL | 0 refills | Status: DC | PRN

## 2020-08-31 MED ORDER — SULFAMETHOXAZOLE-TRIMETHOPRIM 800-160 MG PO TABS
1.0000 | ORAL_TABLET | Freq: Two times a day (BID) | ORAL | 0 refills | Status: DC
Start: 2020-08-31 — End: 2020-09-03

## 2020-08-31 NOTE — Telephone Encounter (Signed)
Pt left VM stating she has been sick for 2 weeks.   Return call to pt. Pt states she has been sick for 2 weeks. States she is vomiting (has not thrown up since she was pregnant), she has had to cancel appointments. States she has felt so weak she has not left house in a week. States is started with not being able to pass BM for 4-5 days. She used enemas "everything I knew to do" took dulcolax and it broke up. Did outdoor show 20th-25th, laid in the car during the show. Was hurting in abd and lower back. Pt states she has loss of appetite, friend has had to bring food for son and feed cats.  States she slept for three days. States she is weak. Has been able to eat the past couple nights. Has been able to drink water.    Complains of n/v, weakness, abd pain (lower), burning w/ urination,  Denies fever.   Scheduled pt same day appointment.   Royce Macadamia, [place] 08/31/20 8:06 AM

## 2020-08-31 NOTE — Progress Notes (Addendum)
Acute Office Visit  Subjective:    Patient ID: Connie Coffey, female    DOB: [birth year], 65 y.o.   MRN: [id number]  Chief Complaint  Patient presents with  . Nausea    Constipation/not feeling good    HPI Connie Coffey is a 65 year old Caucasian female in today for generalized malaise, fever, diarrhea, vomiting, and dysuria for 3-weeks. Denies any previous treatments at home. She reports loss of appetite and generalized weakness. She ambulates with a walker due to chronic back pain. She is a Sachdeva-term cigarette smoker with COPD. Denies dyspnea. Has chronic "smoker's" cough.  [name] states she has a palpable tender mass to right breast.Last mammogram normal on 04/06/2020 normal. States she has a history of fibrocystic breast disease in the past; was encouraged to avoid caffeine.   Past Medical History:  Diagnosis Date  . Adrenal insufficiency ([place])   . Arthritis   . Carpal tunnel syndrome    Both hands, worse in right  . Chronic constipation   . COPD (chronic obstructive pulmonary disease) ([place])   . CVA (cerebral vascular accident) ([place])   . Facet syndrome   . Fibromyalgia   . GERD (gastroesophageal reflux disease)   . Heart murmur   . Hypercholesteremia   . IBS (irritable bowel syndrome)   . Ischemic stroke ([place])    07/19/2004  . Lumbar radiculopathy   . Osteoporosis   . RLS (restless legs syndrome)   . Spinal stenosis   . TMJ (temporomandibular joint disorder)   . Trochanteric bursitis of both hips   . Vascular malformation    spinal cord    Past Surgical History:  Procedure Laterality Date  . APPENDECTOMY    . CARPAL TUNNEL RELEASE Right 12/13/2017   surgery  . CHOLECYSTECTOMY    . COLONOSCOPY  05/06/2017   Colonic polyps status post polypectomy. Interan land external hemorrhoids.   . ESOPHAGOGASTRODUODENOSCOPY  04/28/2007   Mild gastrtiis. Otherwise normal EGD.   Marland Kitchen HEMORROIDECTOMY    . LAMINECTOMY AND MICRODISCECTOMY LUMBAR SPINE  01/03/2005  . NECK SURGERY     x2    . OTHER SURGICAL HISTORY  04/24/2004   anterior cervical discectomy and fusion at C5-C6  . TONSILLECTOMY    . TOTAL ABDOMINAL HYSTERECTOMY      Family History  Problem Relation Age of Onset  . Supraventricular tachycardia Mother   . Dementia Mother   . Hyperlipidemia Mother   . Hypertension Mother   . Osteoarthritis Mother   . Osteoporosis Mother   . Breast cancer Other   . Breast cancer Other   . Colon cancer Neg Hx   . Esophageal cancer Neg Hx     Social History   Socioeconomic History  . Marital status: Divorced    Spouse name: Not on file  . Number of children: 1  . Years of education: Not on file  . Highest education level: Not on file  Occupational History  . Occupation: Disabled  Tobacco Use  . Smoking status: Current Every Day Smoker    Packs/day: 1.50    Types: Cigarettes  . Smokeless tobacco: Never Used  Vaping Use  . Vaping Use: Never used  Substance and Sexual Activity  . Alcohol use: Not Currently    Comment: quit 19 years  . Drug use: Never  . Sexual activity: Not on file  Other Topics Concern  . Not on file  Social History Narrative  . Not on file   Social Determinants of Health   Financial  Resource Strain: Not on file  Food Insecurity: Not on file  Transportation Needs: Not on file  Physical Activity: Not on file  Stress: Not on file  Social Connections: Not on file  Intimate Partner Violence: Not on file    Outpatient Medications Prior to Visit  Medication Sig Dispense Refill  . albuterol (VENTOLIN HFA) 108 (90 Base) MCG/ACT inhaler Inhale into the lungs.    Marland Kitchen alendronate (FOSAMAX) 70 MG tablet Take 1 tablet by mouth once a week.    Marland Kitchen atorvastatin (LIPITOR) 40 MG tablet TAKE 1 TABLET BY MOUTH  DAILY 90 tablet 3  . busPIRone (BUSPAR) 30 MG tablet Take 1 tablet (30 mg total) by mouth 2 (two) times daily. 180 tablet 1  . clonazePAM (KLONOPIN) 0.5 MG tablet TAKE 1 TABLET BY MOUTH TWICE A DAY AS NEEDED FOR ANXIETY *05/28/20* 60 tablet 1  .  cyclobenzaprine (FLEXERIL) 10 MG tablet Take 10 mg by mouth 3 (three) times daily.    . diclofenac sodium (VOLTAREN) 1 % GEL Apply 1 application topically daily as needed.    . DULoxetine (CYMBALTA) 60 MG capsule Take 1 capsule by mouth 2 (two) times daily.    Marland Kitchen DYMISTA 137-50 MCG/ACT SUSP One spray each nostril twice a day. 23 g 3  . fenofibrate 160 MG tablet Take 1 tablet (160 mg total) by mouth daily. 90 tablet 1  . hydrocortisone (CORTEF) 20 MG tablet Take 1 tablet in the morning and 1/2 tablet at 4pm    . levocetirizine (XYZAL) 5 MG tablet TAKE 1 TABLET BY MOUTH EVERY DAY IN THE EVENING 90 tablet 2  . levothyroxine (SYNTHROID, LEVOTHROID) 50 MCG tablet Take 1 tablet by mouth every morning.    . lubiprostone (AMITIZA) 8 MCG capsule TAKE 1 CAPSULE (8 MCG TOTAL) BY MOUTH 2 (TWO) TIMES DAILY WITH A MEAL. 60 capsule 1  . montelukast (SINGULAIR) 10 MG tablet Take 1 tablet (10 mg total) by mouth daily. 90 tablet 3  . Multiple Vitamin (MULTI-VITAMINS) TABS Take 1 tablet by mouth daily.    . naloxone (NARCAN) nasal spray 4 mg/0.1 mL Place 1 spray into the nose once. (for suspected overdose)    . olopatadine (PATANOL) 0.1 % ophthalmic solution Use 1 drop in each eye twice daily as needed 5 mL 3  . omeprazole (PRILOSEC) 40 MG capsule TAKE 1 CAPSULE BY MOUTH  TWICE DAILY 180 capsule 3  . oxyCODONE-acetaminophen (PERCOCET) 10-325 MG tablet TAKE 1 TAB EVERY 6 HOURS FOR 5 DAYS THEN 1 TAB TWICE A DAY AS NEEDED FOR SEVERE BREAKTHROUGH PAIN  0  . primidone (MYSOLINE) 50 MG tablet TAKE 1 TABLET BY MOUTH TWICE A DAY 60 tablet 5  . Tiotropium Bromide Monohydrate (SPIRIVA RESPIMAT) 2.5 MCG/ACT AERS Inhale into the lungs.    . Vitamin D, Ergocalciferol, (DRISDOL) 50000 units CAPS capsule Take 1 capsule by mouth once a week.    Marland Kitchen ZTLIDO 1.8 % PTCH SMARTSIG:1 Patch(s) Topical As Needed    . gabapentin (NEURONTIN) 600 MG tablet Take 1 tablet by mouth 4 (four) times daily.    . nitrofurantoin,  macrocrystal-monohydrate, (MACROBID) 100 MG capsule Take 1 capsule (100 mg total) by mouth 2 (two) times daily. 14 capsule 0  . phenazopyridine (PYRIDIUM) 200 MG tablet Take 1 tablet (200 mg total) by mouth 3 (three) times daily as needed for pain. 15 tablet 0  . sulfamethoxazole-trimethoprim (BACTRIM DS) 800-160 MG tablet Take 1 tablet by mouth 2 (two) times daily. 14 tablet 0   No  facility-administered medications prior to visit.    Allergies  Allergen Reactions  . Celebrex [Celecoxib]     "because of stomach ulcers"  . Chantix [Varenicline]   . Fish Oil Other (See Comments)    hemorrhoids  . Gramineae Pollens   . Nsaids Other (See Comments)    GI Upset. Ulcers  . Other     beets  . Nicotrol [Nicotine] Nausea Only  . Penicillins Rash    Review of Systems  Constitutional: Positive for appetite change and fatigue. Negative for unexpected weight change.  HENT: Negative for congestion, ear pain, rhinorrhea, sinus pressure, sinus pain and tinnitus.   Eyes: Negative for pain.  Respiratory: Positive for cough. Negative for shortness of breath.   Cardiovascular: Positive for leg swelling (bilateral pedal edema). Negative for chest pain and palpitations.  Gastrointestinal: Positive for abdominal pain, constipation, diarrhea and vomiting.  Endocrine: Negative for cold intolerance, heat intolerance, polydipsia, polyphagia and polyuria.  Genitourinary: Positive for dysuria and frequency. Negative for hematuria.       Incontinent of urine  Musculoskeletal: Positive for back pain (chronic) and myalgias. Negative for arthralgias and joint swelling.  Skin: Negative for rash.       Right breast lump with tenderness  Allergic/Immunologic: Negative for environmental allergies.  Neurological: Positive for dizziness, weakness and headaches.  Hematological: Negative for adenopathy.  Psychiatric/Behavioral: Negative for decreased concentration and sleep disturbance. The patient is not  nervous/anxious.        Objective:    Physical Exam Vitals reviewed.  Constitutional:      Appearance: Normal appearance.  HENT:     Head: Normocephalic.     Right Ear: Tympanic membrane normal.     Left Ear: Tympanic membrane normal.     Nose: Nose normal.     Mouth/Throat:     Mouth: Mucous membranes are dry.  Eyes:     Comments: Eye glasses in place  Cardiovascular:     Rate and Rhythm: Regular rhythm. Tachycardia present.     Pulses: Normal pulses.     Heart sounds: Normal heart sounds.  Pulmonary:     Effort: Pulmonary effort is normal.     Breath sounds: Normal breath sounds.  Chest:  Breasts:     Right: Mass present.     Left: Normal.     Abdominal:     General: Bowel sounds are normal.     Palpations: Abdomen is soft.     Tenderness: There is no abdominal tenderness. There is no right CVA tenderness or left CVA tenderness.  Musculoskeletal:        General: Tenderness (chronic back pain) present.  Skin:    General: Skin is warm and dry.     Capillary Refill: Capillary refill takes less than 2 seconds.  Neurological:     General: No focal deficit present.     Mental Status: She is alert and oriented to person, place, and time.  Psychiatric:        Mood and Affect: Mood normal.        Behavior: Behavior normal.     BP 110/82 (BP Location: Left Arm, Patient Position: Sitting)   Pulse (!) 104   Temp 97.7 F (36.5 C) (Temporal)   Ht 5\' 3"  (1.6 m)   Wt 138 lb (62.6 kg)   SpO2 91%   BMI 24.45 kg/m  Wt Readings from Last 3 Encounters:  08/31/20 138 lb (62.6 kg)  07/26/20 142 lb (64.4 kg)  07/04/20 143 lb (64.9  kg)    Health Maintenance Due  Topic Date Due  . Hepatitis C Screening  Never done  . PNA vac Low Risk Adult (2 of 2 - PPSV23) 05/05/2019       Lab Results  Component Value Date   TSH 0.335 (L) 07/26/2020   Lab Results  Component Value Date   WBC 15.6 (H) 07/26/2020   HGB 13.2 07/26/2020   HCT 39.8 07/26/2020   MCV 91 07/26/2020    PLT 482 (H) 07/26/2020   Lab Results  Component Value Date   NA 144 07/26/2020   K 4.2 07/26/2020   CO2 24 07/26/2020   GLUCOSE 103 (H) 07/26/2020   BUN 12 07/26/2020   CREATININE 0.80 07/26/2020   BILITOT 0.2 07/26/2020   ALKPHOS 37 (L) 07/26/2020   AST 18 07/26/2020   ALT 19 07/26/2020   PROT 6.5 07/26/2020   ALBUMIN 4.4 07/26/2020   CALCIUM 9.6 07/26/2020   Lab Results  Component Value Date   CHOL 183 07/26/2020   Lab Results  Component Value Date   HDL 82 07/26/2020   Lab Results  Component Value Date   LDLCALC 83 07/26/2020   Lab Results  Component Value Date   TRIG 102 07/26/2020   Lab Results  Component Value Date   CHOLHDL 2.2 07/26/2020       Assessment & Plan:   1. Gastroenteritis - CBC with Differential/Platelet - Comprehensive metabolic panel - POCT urinalysis dipstick - ondansetron (ZOFRAN ODT) 4 MG disintegrating tablet; Take 1 tablet (4 mg total) by mouth every 8 (eight) hours as needed for nausea or vomiting.  Dispense: 20 tablet; Refill: 0  2. Dysuria - POCT urinalysis dipstick  3. Decreased urine output - POCT urinalysis dipstick  4. Tachycardia - Comprehensive metabolic panel  5. Lump of right breast - US BREAST COMPLETE UNI RIGHT INC AXILLA; Future  6. Acute cystitis with hematuria - Urine Culture - levofloxacin (LEVAQUIN) 500 MG tablet; Take 1 tablet (500 mg total) by mouth daily.  Dispense: 7 tablet; Refill: 0    Rest and push fluids Eat bland diet Zofran 4 mg as needed for nausea Take Bactrim DS twice daily for 7 days Follow-up as needed   *Urine culture revealed resistance to Bactrim-DS. Antibiotic treatment changed to Levaquin.   Follow-up: as needed  Signed, Rip Harbour, NP

## 2020-08-31 NOTE — Patient Instructions (Addendum)
Rest and push fluids Eat bland diet Zofran 4 mg as needed for nausea Take Bactrim DS twice daily for 7 days Follow-up as needed  Urinary Tract Infection, Adult A urinary tract infection (UTI) is an infection of any part of the urinary tract. The urinary tract includes:  The kidneys.  The ureters.  The bladder.  The urethra. These organs make, store, and get rid of pee (urine) in the body. What are the causes? This infection is caused by germs (bacteria) in your genital area. These germs grow and cause swelling (inflammation) of your urinary tract. What increases the risk? The following factors may make you more likely to develop this condition:  Using a small, thin tube (catheter) to drain pee.  Not being able to control when you pee or poop (incontinence).  Being female. If you are female, these things can increase the risk: ? Using these methods to prevent pregnancy:  A medicine that kills sperm (spermicide).  A device that blocks sperm (diaphragm). ? Having low levels of a female hormone (estrogen). ? Being pregnant. You are more likely to develop this condition if:  You have genes that add to your risk.  You are sexually active.  You take antibiotic medicines.  You have trouble peeing because of: ? A prostate that is bigger than normal, if you are female. ? A blockage in the part of your body that drains pee from the bladder. ? A kidney stone. ? A nerve condition that affects your bladder. ? Not getting enough to drink. ? Not peeing often enough.  You have other conditions, such as: ? Diabetes. ? A weak disease-fighting system (immune system). ? Sickle cell disease. ? Gout. ? Injury of the spine. What are the signs or symptoms? Symptoms of this condition include:  Needing to pee right away.  Peeing small amounts often.  Pain or burning when peeing.  Blood in the pee.  Pee that smells bad or not like normal.  Trouble peeing.  Pee that is  cloudy.  Fluid coming from the vagina, if you are female.  Pain in the belly or lower back. Other symptoms include:  Vomiting.  Not feeling hungry.  Feeling mixed up (confused). This may be the first symptom in older adults.  Being tired and grouchy (irritable).  A fever.  Watery poop (diarrhea). How is this treated?  Taking antibiotic medicine.  Taking other medicines.  Drinking enough water. In some cases, you may need to see a specialist. Follow these instructions at home: Medicines  Take over-the-counter and prescription medicines only as told by your doctor.  If you were prescribed an antibiotic medicine, take it as told by your doctor. Do not stop taking it even if you start to feel better. General instructions  Make sure you: ? Pee until your bladder is empty. ? Do not hold pee for a Degen time. ? Empty your bladder after sex. ? Wipe from front to back after peeing or pooping if you are a female. Use each tissue one time when you wipe.  Drink enough fluid to keep your pee pale yellow.  Keep all follow-up visits.   Contact a doctor if:  You do not get better after 1-2 days.  Your symptoms go away and then come back. Get help right away if:  You have very bad back pain.  You have very bad pain in your lower belly.  You have a fever.  You have chills.  You feeling like you will vomit  or you vomit. Summary  A urinary tract infection (UTI) is an infection of any part of the urinary tract.  This condition is caused by germs in your genital area.  There are many risk factors for a UTI.  Treatment includes antibiotic medicines.  Drink enough fluid to keep your pee pale yellow. This information is not intended to replace advice given to you by your health care provider. Make sure you discuss any questions you have with your health care provider. Document Revised: 12/25/2019 Document Reviewed: 12/25/2019 Elsevier Patient Education  2021 [place].   Viral Gastroenteritis, Adult  Viral gastroenteritis is also known as the stomach flu. This condition may affect your stomach, your small intestine, and your large intestine. It can cause sudden watery poop (diarrhea), fever, and throwing up (vomiting). This condition is caused by certain germs (viruses). These germs can be passed from person to person very easily (are contagious). Having watery poop and throwing up can make you feel weak and cause you to not have enough water in your body (get dehydrated). This can make you tired and thirsty, make you have a dry mouth, and make it so you pee (urinate) less often. It is important to replace the fluids that you lose from having watery poop and throwing up. What are the causes?  You can get sick by catching viruses from other people.  You can also get sick by: ? Eating food, drinking water, or touching a surface that has the viruses on it (is contaminated). ? Sharing utensils or other personal items with a person who is sick. What increases the risk?  Having a weak body defense system (immune system).  Living with one or more children who are younger than 35 years old.  Living in a nursing home.  Going on cruise ships. What are the signs or symptoms? Symptoms of this condition start suddenly. Symptoms may last for a few days or for as [place] as a week.  Common symptoms include: ? Watery poop. ? Throwing up.  Other symptoms include: ? Fever. ? Headache. ? Feeling tired (fatigue). ? Pain in the belly (abdomen). ? Chills. ? Feeling weak. ? Feeling sick to your stomach (nauseous). ? Muscle aches. ? Not feeling hungry. How is this treated?  This condition typically goes away on its own.  The focus of treatment is to replace the fluids that you lose. This condition may be treated with: ? An ORS (oral rehydration solution). This is a drink that is sold at pharmacies and stores. ? Medicines to help with your  symptoms. ? Probiotic supplements to reduce symptoms of diarrhea. ? Fluids given through an IV tube, if needed.  Older adults and people with other diseases or a weak body defense system are at higher risk for not having enough water in the body. Follow these instructions at home: Eating and drinking  Take an ORS as told by your doctor.  Drink clear fluids in small amounts as you are able. Clear fluids include: ? Water. ? Ice chips. ? Fruit juice with water added to it (diluted). ? Low-calorie sports drinks.  Drink enough fluid to keep your pee (urine) pale yellow.  Eat small amounts of healthy foods every 3-4 hours as you are able. This may include whole grains, fruits, vegetables, lean meats, and yogurt.  Avoid fluids that have a lot of sugar or caffeine in them, such as energy drinks, sports drinks, and soda.  Avoid spicy or fatty foods.  Avoid alcohol.  General instructions  Wash your hands often. This is very important after you have watery poop or you throw up. If you cannot use soap and water, use hand sanitizer.  Make sure that all people in your home wash their hands well and often.  Take over-the-counter and prescription medicines only as told by your doctor.  Rest at home while you get better.  Watch your condition for any changes.  Take a warm bath to help with any burning or pain from having watery poop.  Keep all follow-up visits as told by your doctor. This is important.   Contact a doctor if:  You cannot keep fluids down.  Your symptoms get worse.  You have new symptoms.  You feel light-headed.  You feel dizzy.  You have muscle cramps. Get help right away if:  You have chest pain.  You feel very weak.  You pass out (faint).  You see blood in your throw-up.  Your throw-up looks like coffee grounds.  You have bloody or black poop (stools) or poop that looks like tar.  You have a very bad headache, or a stiff neck, or both.  You have  a rash.  You have very bad pain, cramping, or bloating in your belly.  You have trouble breathing.  You are breathing very quickly.  You have a fast heartbeat.  Your skin feels cold and clammy.  You feel mixed up (confused).  You have pain when you pee.  You have signs of not having enough water in the body, such as: ? Dark pee, hardly any pee, or no pee. ? Cracked lips. ? Dry mouth. ? Sunken eyes. ? Feeling very sleepy. ? Feeling weak. Summary  Viral gastroenteritis is also known as the stomach flu.  This condition can cause sudden watery poop (diarrhea), fever, and throwing up (vomiting).  These germs can be passed from person to person very easily.  Take an ORS as told by your doctor. This is a drink that is sold at pharmacies and stores.  Drink fluids in small amounts many times each day as you are able. This information is not intended to replace advice given to you by your health care provider. Make sure you discuss any questions you have with your health care provider. Document Revised: 03/19/2018 Document Reviewed: 03/19/2018 Elsevier Patient Education  2021 [place] Diet A bland diet consists of foods that are often soft and do not have a lot of fat, fiber, or extra seasonings. Foods without fat, fiber, or seasoning are easier for the body to digest. They are also less likely to irritate your mouth, throat, stomach, and other parts of your digestive system. A bland diet is sometimes called a BRAT diet. What is my plan? Your health care provider or food and nutrition specialist (dietitian) may recommend specific changes to your diet to prevent symptoms or to treat your symptoms. These changes may include:  Eating small meals often.  Cooking food until it is soft enough to chew easily.  Chewing your food well.  Drinking fluids slowly.  Not eating foods that are very spicy, sour, or fatty.  Not eating citrus fruits, such as oranges and  grapefruit. What do I need to know about this diet?  Eat a variety of foods from the bland diet food list.  Do not follow a bland diet longer than needed.  Ask your health care provider whether you should take vitamins or supplements. What foods can I eat? Grains Hot  cereals, such as cream of wheat. Rice. Bread, crackers, or tortillas made from refined white flour.   Vegetables Canned or cooked vegetables. Mashed or boiled potatoes. Fruits Bananas. Applesauce. Other types of cooked or canned fruit with the skin and seeds removed, such as canned peaches or pears.   Meats and other proteins Scrambled eggs. Creamy peanut butter or other nut butters. Lean, well-cooked meats, such as chicken or fish. Tofu. Soups or broths.   Dairy Low-fat dairy products, such as milk, cottage cheese, or yogurt. Beverages Water. Herbal tea. Apple juice.   Fats and oils Mild salad dressings. [place] or olive oil. Sweets and desserts Pudding. Custard. Fruit gelatin. Ice cream. The items listed above may not be a complete list of recommended foods and beverages. Contact a dietitian for more options. What foods are not recommended? Grains Whole grain breads and cereals. Vegetables Raw vegetables. Fruits Raw fruits, especially citrus, berries, or dried fruits. Dairy Whole fat dairy foods. Beverages Caffeinated drinks. Alcohol. Seasonings and condiments Strongly flavored seasonings or condiments. Hot sauce. Salsa. Other foods Spicy foods. Fried foods. Sour foods, such as pickled or fermented foods. Foods with high sugar content. Foods high in fiber. The items listed above may not be a complete list of foods and beverages to avoid. Contact a dietitian for more information. Summary  A bland diet consists of foods that are often soft and do not have a lot of fat, fiber, or extra seasonings.  Foods without fat, fiber, or seasoning are easier for the body to digest.  Check with your health care provider  to see how [place] you should follow this diet plan. It is not meant to be followed for [place] periods. This information is not intended to replace advice given to you by your health care provider. Make sure you discuss any questions you have with your health care provider. Document Revised: 06/12/2017 Document Reviewed: 06/12/2017 Elsevier Patient Education  2021 [employer].

## 2020-09-01 ENCOUNTER — Telehealth: Payer: Self-pay | Admitting: Oncology

## 2020-09-01 LAB — COMPREHENSIVE METABOLIC PANEL
ALT: 22 IU/L (ref 0–32)
AST: 19 IU/L (ref 0–40)
Albumin/Globulin Ratio: 1.9 (ref 1.2–2.2)
Albumin: 3.9 g/dL (ref 3.8–4.8)
Alkaline Phosphatase: 52 IU/L (ref 44–121)
BUN/Creatinine Ratio: 19 (ref 12–28)
BUN: 12 mg/dL (ref 8–27)
Bilirubin Total: 0.2 mg/dL (ref 0.0–1.2)
CO2: 28 mmol/L (ref 20–29)
Calcium: 9.3 mg/dL (ref 8.7–10.3)
Chloride: 93 mmol/L — ABNORMAL LOW (ref 96–106)
Creatinine, Ser: 0.62 mg/dL (ref 0.57–1.00)
Globulin, Total: 2.1 g/dL (ref 1.5–4.5)
Glucose: 99 mg/dL (ref 65–99)
Potassium: 3.8 mmol/L (ref 3.5–5.2)
Sodium: 141 mmol/L (ref 134–144)
Total Protein: 6 g/dL (ref 6.0–8.5)
eGFR: 98 mL/min/{1.73_m2} (ref >59)

## 2020-09-01 LAB — CBC WITH DIFFERENTIAL/PLATELET
Basophils Absolute: 0.2 10*3/uL (ref 0.0–0.2)
Basos: 1 %
EOS (ABSOLUTE): 0.2 10*3/uL (ref 0.0–0.4)
Eos: 1 %
Hematocrit: 36.3 % (ref 34.0–46.6)
Hemoglobin: 11.5 g/dL (ref 11.1–15.9)
Immature Grans (Abs): 0.3 10*3/uL — ABNORMAL HIGH (ref 0.0–0.1)
Immature Granulocytes: 2 %
Lymphocytes Absolute: 3.6 10*3/uL — ABNORMAL HIGH (ref 0.7–3.1)
Lymphs: 21 %
MCH: 28.3 pg (ref 26.6–33.0)
MCHC: 31.7 g/dL (ref 31.5–35.7)
MCV: 89 fL (ref 79–97)
Monocytes Absolute: 0.9 10*3/uL (ref 0.1–0.9)
Monocytes: 5 %
Neutrophils Absolute: 12 10*3/uL — ABNORMAL HIGH (ref 1.4–7.0)
Neutrophils: 70 %
Platelets: 802 10*3/uL (ref 150–450)
RBC: 4.07 x10E6/uL (ref 3.77–5.28)
RDW: 13.4 % (ref 11.7–15.4)
WBC: 17.1 10*3/uL — ABNORMAL HIGH (ref 3.4–10.8)

## 2020-09-01 NOTE — Telephone Encounter (Signed)
Patient was sick for 3 weeks.  Reschedule New Patient Appt for 4/28 Labs 1:15 pm - Consult 1:45 pm

## 2020-09-03 ENCOUNTER — Other Ambulatory Visit: Payer: Self-pay | Admitting: Nurse Practitioner

## 2020-09-03 DIAGNOSIS — D75839 Thrombocytosis, unspecified: Secondary | ICD-10-CM

## 2020-09-03 LAB — URINE CULTURE

## 2020-09-03 MED ORDER — LEVOFLOXACIN 500 MG PO TABS: 500.0000 mg | ORAL_TABLET | Freq: Every day | ORAL | 0 refills | Status: DC

## 2020-09-06 ENCOUNTER — Other Ambulatory Visit: Payer: Self-pay

## 2020-09-06 DIAGNOSIS — R7989 Other specified abnormal findings of blood chemistry: Secondary | ICD-10-CM

## 2020-09-07 ENCOUNTER — Other Ambulatory Visit: Payer: Medicare Other

## 2020-09-07 ENCOUNTER — Other Ambulatory Visit: Payer: Self-pay

## 2020-09-07 DIAGNOSIS — R7989 Other specified abnormal findings of blood chemistry: Secondary | ICD-10-CM | POA: Diagnosis not present

## 2020-09-07 LAB — CBC
Hematocrit: 37.1 % (ref 34.0–46.6)
Hemoglobin: 11.9 g/dL (ref 11.1–15.9)
MCH: 29 pg (ref 26.6–33.0)
MCHC: 32.1 g/dL (ref 31.5–35.7)
MCV: 91 fL (ref 79–97)
Platelets: 614 10*3/uL — ABNORMAL HIGH (ref 150–450)
RBC: 4.1 x10E6/uL (ref 3.77–5.28)
RDW: 13.5 % (ref 11.7–15.4)
WBC: 14.1 10*3/uL — ABNORMAL HIGH (ref 3.4–10.8)

## 2020-09-12 ENCOUNTER — Telehealth: Payer: Self-pay

## 2020-09-12 NOTE — Telephone Encounter (Signed)
Pt called back about lab work. Pt VU and states her next hematology appointment is 09/22/20.   Harrell Lark 09/12/20 2:31 PM

## 2020-09-14 DIAGNOSIS — Z981 Arthrodesis status: Secondary | ICD-10-CM | POA: Insufficient documentation

## 2020-09-14 DIAGNOSIS — M5134 Other intervertebral disc degeneration, thoracic region: Secondary | ICD-10-CM | POA: Diagnosis not present

## 2020-09-14 DIAGNOSIS — M961 Postlaminectomy syndrome, not elsewhere classified: Secondary | ICD-10-CM | POA: Diagnosis not present

## 2020-09-14 DIAGNOSIS — M5416 Radiculopathy, lumbar region: Secondary | ICD-10-CM | POA: Diagnosis not present

## 2020-09-15 DIAGNOSIS — Z79899 Other long term (current) drug therapy: Secondary | ICD-10-CM | POA: Diagnosis not present

## 2020-09-15 DIAGNOSIS — N390 Urinary tract infection, site not specified: Secondary | ICD-10-CM | POA: Diagnosis not present

## 2020-09-15 DIAGNOSIS — R339 Retention of urine, unspecified: Secondary | ICD-10-CM | POA: Diagnosis not present

## 2020-09-20 DIAGNOSIS — M7061 Trochanteric bursitis, right hip: Secondary | ICD-10-CM | POA: Diagnosis not present

## 2020-09-20 DIAGNOSIS — I10 Essential (primary) hypertension: Secondary | ICD-10-CM | POA: Diagnosis not present

## 2020-09-20 DIAGNOSIS — M5416 Radiculopathy, lumbar region: Secondary | ICD-10-CM | POA: Diagnosis not present

## 2020-09-21 NOTE — Progress Notes (Signed)
[facility]  [address] [place],  [place]  [address] [phone]  Clinic Day:  10/10/2020  Referring physician: [name], MD   HISTORY OF PRESENT ILLNESS:  The patient is a 65 y.o. femalewho I was asked to consult upon for an abnormal CBC.  Recent studies showed an elevated white count of 17.1 and an elevated platelet count of 802.  When rechecked a week later, her white cells and platelets were still elevated at 14.1 and 614, respectively.   Per hospital labs, her blood counts have been elevated since 2013.  She has taken hydrocortisone for multiple years for adrenal insufficiency.  She also gets steroid injections to her back every 3 months.  She is also a chronic smoker.  To her knowledge, there is no family history of any hematologic disorders.  She also denies having any B symptoms which concern her for her elevated counts being due to an underlying hematologic malignancy.  PAST MEDICAL HISTORY:   Past Medical History:  Diagnosis Date  . Adrenal insufficiency ([place])   . Arthritis   . Carpal tunnel syndrome    Both hands, worse in right  . Chronic constipation   . COPD (chronic obstructive pulmonary disease) ([place])   . CVA (cerebral vascular accident) ([place])   . Facet syndrome   . Fibromyalgia   . GERD (gastroesophageal reflux disease)   . Heart murmur   . Hypercholesteremia   . IBS (irritable bowel syndrome)   . Ischemic stroke ([place])    07/19/2004  . Lumbar radiculopathy   . Osteoporosis   . RLS (restless legs syndrome)   . Spinal stenosis   . TMJ (temporomandibular joint disorder)   . Trochanteric bursitis of both hips   . Vascular malformation    spinal cord    PAST SURGICAL HISTORY:   Past Surgical History:  Procedure Laterality Date  . APPENDECTOMY    . CARPAL TUNNEL RELEASE Right 12/13/2017   surgery  . CHOLECYSTECTOMY    . COLONOSCOPY  05/06/2017   Colonic polyps status post polypectomy. Interan land external  hemorrhoids.   . ESOPHAGOGASTRODUODENOSCOPY  04/28/2007   Mild gastrtiis. Otherwise normal EGD.   Marland Kitchen HEMORROIDECTOMY    . LAMINECTOMY AND MICRODISCECTOMY LUMBAR SPINE  01/03/2005  . NECK SURGERY     x2   . OTHER SURGICAL HISTORY  04/24/2004   anterior cervical discectomy and fusion at C5-C6  . TONSILLECTOMY    . TOTAL ABDOMINAL HYSTERECTOMY      CURRENT MEDICATIONS:   Current Outpatient Medications  Medication Sig Dispense Refill  . albuterol (VENTOLIN HFA) 108 (90 Base) MCG/ACT inhaler Inhale 2 puffs into the lungs every 4 (four) hours as needed.    Marland Kitchen alendronate (FOSAMAX) 70 MG tablet Take 1 tablet by mouth once a week.    Marland Kitchen atorvastatin (LIPITOR) 40 MG tablet TAKE 1 TABLET BY MOUTH  DAILY 90 tablet 3  . bethanechol (URECHOLINE) 25 MG tablet Take 25 mg by mouth in the morning and at bedtime.    . busPIRone (BUSPAR) 30 MG tablet TAKE 1 TABLET BY MOUTH  TWICE DAILY 180 tablet 1  . clonazePAM (KLONOPIN) 0.5 MG tablet Take 1 tablet (0.5 mg total) by mouth 2 (two) times daily. 60 tablet 1  . cyclobenzaprine (FLEXERIL) 10 MG tablet Take 10 mg by mouth 3 (three) times daily.    . D-Mannose 500 MG CAPS Take 1 capsule by mouth in the morning and at bedtime.    . diclofenac  sodium (VOLTAREN) 1 % GEL Apply 1 application topically daily as needed. (Patient not taking: Reported on 10/06/2020)    . DULoxetine (CYMBALTA) 60 MG capsule Take 1 capsule by mouth daily.    Marland Kitchen DYMISTA 137-50 MCG/ACT SUSP One spray each nostril twice a day. 23 g 3  . fenofibrate 160 MG tablet Take 1 tablet (160 mg total) by mouth daily. 90 tablet 1  . gabapentin (NEURONTIN) 600 MG tablet Take 1 tablet by mouth 4 (four) times daily.    . hydrocortisone (CORTEF) 20 MG tablet Take 1 tablet in the morning and 1/2 tablet at 4pm    . levocetirizine (XYZAL) 5 MG tablet TAKE 1 TABLET BY MOUTH EVERY DAY IN THE EVENING 90 tablet 2  . levothyroxine (SYNTHROID, LEVOTHROID) 50 MCG tablet Take 1 tablet by mouth every morning.    .  lubiprostone (AMITIZA) 8 MCG capsule TAKE 1 CAPSULE (8 MCG TOTAL) BY MOUTH 2 (TWO) TIMES DAILY WITH A MEAL. 180 capsule 1  . montelukast (SINGULAIR) 10 MG tablet Take 1 tablet (10 mg total) by mouth daily. 90 tablet 3  . Multiple Vitamin (MULTI-VITAMINS) TABS Take 1 tablet by mouth daily.    . naloxone (NARCAN) nasal spray 4 mg/0.1 mL Place 1 spray into the nose once. (for suspected overdose) (Patient not taking: Reported on 10/06/2020)    . olopatadine (PATANOL) 0.1 % ophthalmic solution Use 1 drop in each eye twice daily as needed (Patient not taking: Reported on 10/06/2020) 5 mL 3  . omeprazole (PRILOSEC) 40 MG capsule TAKE 1 CAPSULE BY MOUTH  TWICE DAILY 180 capsule 3  . ondansetron (ZOFRAN ODT) 4 MG disintegrating tablet Take 1 tablet (4 mg total) by mouth every 8 (eight) hours as needed for nausea or vomiting. (Patient not taking: Reported on 10/06/2020) 20 tablet 0  . oxyCODONE-acetaminophen (PERCOCET) 10-325 MG tablet TAKE 1 TAB EVERY 6 HOURS FOR 5 DAYS THEN 1 TAB TWICE A DAY AS NEEDED FOR SEVERE BREAKTHROUGH PAIN  0  . primidone (MYSOLINE) 50 MG tablet Take 1 tablet (50 mg total) by mouth 2 (two) times daily. 60 tablet 5  . Tiotropium Bromide Monohydrate (SPIRIVA RESPIMAT) 2.5 MCG/ACT AERS Inhale 1 puff into the lungs 2 (two) times daily.    . Vitamin D, Ergocalciferol, (DRISDOL) 50000 units CAPS capsule Take 1 capsule by mouth once a week.    Marland Kitchen ZTLIDO 1.8 % PTCH SMARTSIG:1 Patch(s) Topical As Needed     No current facility-administered medications for this visit.    ALLERGIES:   Allergies  Allergen Reactions  . Celebrex [Celecoxib]     "because of stomach ulcers"  . Chantix [Varenicline]   . Fish Oil Other (See Comments)    hemorrhoids  . Gramineae Pollens   . Nsaids Other (See Comments)    GI Upset. Ulcers  . Other     beets  . Nicotrol [Nicotine] Nausea Only  . Penicillins Rash    FAMILY HISTORY:   Family History  Problem Relation Age of Onset  . Supraventricular  tachycardia Mother   . Dementia Mother   . Hyperlipidemia Mother   . Hypertension Mother   . Osteoarthritis Mother   . Osteoporosis Mother   . Breast cancer Other   . Breast cancer Other   . Colon cancer Neg Hx   . Esophageal cancer Neg Hx   Her mother died from COPD.  Her father died from heart disease.    SOCIAL HISTORY:  The patient was born and raised in [place].  [place] lives [place].  She is divorced, with 1 child.  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  She also was a previous Advertising copywriter.  She has smoked 2-3 packs of cigarettes daily for 52 years.  She was a previous alcoholic, but quit drinking 21 years ago.   REVIEW OF SYSTEMS:  Review of Systems  Constitutional: Negative for fatigue and fever.  HENT:   Positive for hearing loss. Negative for sore throat.   Eyes: Negative for eye problems.  Respiratory: Positive for cough. Negative for chest tightness and hemoptysis.   Cardiovascular: Positive for palpitations. Negative for chest pain.  Gastrointestinal: Positive for constipation. Negative for abdominal distention, abdominal pain, blood in stool, diarrhea, nausea and vomiting.  Endocrine: Negative for hot flashes.  Genitourinary: Positive for difficulty urinating. Negative for dysuria, frequency, hematuria and nocturia.   Musculoskeletal: Positive for arthralgias and back pain. Negative for gait problem and myalgias.  Skin: Negative.  Negative for itching and rash.  Neurological: Negative.  Negative for dizziness, extremity weakness, gait problem, headaches, light-headedness and numbness.  Hematological: Negative.   Psychiatric/Behavioral: Positive for depression. Negative for suicidal ideas. The patient is not nervous/anxious.      PHYSICAL EXAM:  Blood pressure 126/66, pulse 92, temperature 98.6 F (37 C), resp. rate 16, height 5\' 1"  (1.549 m), weight 139 lb (63 kg), SpO2 91 %. Wt Readings from Last 3 Encounters:  09/22/20 139 lb (63 kg)  08/31/20 138  lb (62.6 kg)  07/26/20 142 lb (64.4 kg)   Body mass index is 26.26 kg/m. Performance status (ECOG): 1 - Symptomatic but completely ambulatory Physical Exam Constitutional:      Appearance: Normal appearance. She is not ill-appearing.  HENT:     Mouth/Throat:     Mouth: Mucous membranes are moist.     Pharynx: Oropharynx is clear. No oropharyngeal exudate or posterior oropharyngeal erythema.  Cardiovascular:     Rate and Rhythm: Normal rate and regular rhythm.     Heart sounds: No murmur heard. No friction rub. No gallop.   Pulmonary:     Effort: Pulmonary effort is normal. No respiratory distress.     Breath sounds: Normal breath sounds. No wheezing, rhonchi or rales.  Chest:  Breasts:     Right: No axillary adenopathy or supraclavicular adenopathy.     Left: No axillary adenopathy or supraclavicular adenopathy.    Abdominal:     General: Bowel sounds are normal. There is no distension.     Palpations: Abdomen is soft. There is no mass.     Tenderness: There is no abdominal tenderness.  Musculoskeletal:        General: No swelling.     Right lower leg: No edema.     Left lower leg: No edema.  Lymphadenopathy:     Cervical: No cervical adenopathy.     Upper Body:     Right upper body: No supraclavicular or axillary adenopathy.     Left upper body: No supraclavicular or axillary adenopathy.     Lower Body: No right inguinal adenopathy. No left inguinal adenopathy.  Skin:    General: Skin is warm.     Coloration: Skin is not jaundiced.     Findings: No lesion or rash.  Neurological:     General: No focal deficit present.     Mental Status: She is alert and oriented to person, place, and time. Mental status is at baseline.     Cranial Nerves: Cranial nerves  are intact.  Psychiatric:        Mood and Affect: Mood normal.        Behavior: Behavior normal.        Thought Content: Thought content normal.    LABS:   CBC Latest Ref Rng & Units 09/22/2020 09/07/2020 08/31/2020   WBC - 13.4 14.1(H) 17.1(H)  Hemoglobin 12.0 - 16.0 13.7 11.9 11.5  Hematocrit 36 - 46 43 37.1 36.3  Platelets 150 - 399 524(A) 614(H) 802(HH)   CMP Latest Ref Rng & Units 08/31/2020 07/26/2020 04/26/2020  Glucose 65 - 99 mg/dL 99 103(H) 91  BUN 8 - 27 mg/dL 12 12 12   Creatinine 0.57 - 1.00 mg/dL 0.62 0.80 0.79  Sodium 134 - 144 mmol/L 141 144 143  Potassium 3.5 - 5.2 mmol/L 3.8 4.2 4.4  Chloride 96 - 106 mmol/L 93(L) 104 102  CO2 20 - 29 mmol/L 28 24 28   Calcium 8.7 - 10.3 mg/dL 9.3 9.6 9.9  Total Protein 6.0 - 8.5 g/dL 6.0 6.5 6.2  Total Bilirubin 0.0 - 1.2 mg/dL 0.2 0.2 0.3  Alkaline Phos 44 - 121 IU/L 52 37(L) 38(L)  AST 0 - 40 IU/L 19 18 17   ALT 0 - 32 IU/L 22 19 21      ASSESSMENT & PLAN:  A 65 y.o. female who I was asked to consult upon for an abnormal CBC, which initially included an elevated white count and platelets. She will undergo JAK2 mutation testing to ensure an underlying myeloproliferative disorder, such as essential thrombocythemia.  The patient also smokes, which can cause elevated blood counts.  Her steroid injections could be causing an intermittently elevated white count.  The patient has not had any acute illness/inflammatory process recently to explain her elevated counts.  I am somewhate pleased as her platelets and white cells have improved over the past few weeks.  I will see her back in 1 month to go over her labs collected today, as well as to reassess her counts.  The patient understands all the plans discussed today and is in agreement with them.  I do appreciate Cox, Kirsten, MD for his new consult.   Jaston Havens Macarthur Critchley, MD

## 2020-09-22 ENCOUNTER — Telehealth: Payer: Self-pay | Admitting: Oncology

## 2020-09-22 ENCOUNTER — Inpatient Hospital Stay: Payer: Medicare Other | Attending: Oncology

## 2020-09-22 ENCOUNTER — Other Ambulatory Visit: Payer: Self-pay | Admitting: Oncology

## 2020-09-22 ENCOUNTER — Inpatient Hospital Stay (INDEPENDENT_AMBULATORY_CARE_PROVIDER_SITE_OTHER): Payer: Medicare Other | Admitting: Oncology

## 2020-09-22 ENCOUNTER — Other Ambulatory Visit: Payer: Self-pay

## 2020-09-22 ENCOUNTER — Encounter: Payer: Self-pay | Admitting: Oncology

## 2020-09-22 ENCOUNTER — Other Ambulatory Visit: Payer: Self-pay | Admitting: Hematology and Oncology

## 2020-09-22 DIAGNOSIS — D72829 Elevated white blood cell count, unspecified: Secondary | ICD-10-CM | POA: Diagnosis not present

## 2020-09-22 DIAGNOSIS — R7989 Other specified abnormal findings of blood chemistry: Secondary | ICD-10-CM

## 2020-09-22 DIAGNOSIS — D72828 Other elevated white blood cell count: Secondary | ICD-10-CM | POA: Diagnosis not present

## 2020-09-22 DIAGNOSIS — D751 Secondary polycythemia: Secondary | ICD-10-CM | POA: Insufficient documentation

## 2020-09-22 DIAGNOSIS — D473 Essential (hemorrhagic) thrombocythemia: Secondary | ICD-10-CM

## 2020-09-22 DIAGNOSIS — D649 Anemia, unspecified: Secondary | ICD-10-CM | POA: Diagnosis not present

## 2020-09-22 LAB — CBC AND DIFFERENTIAL
HCT: 43 (ref 36–46)
Hemoglobin: 13.7 (ref 12.0–16.0)
Neutrophils Absolute: 10.45
Platelets: 524 — AB (ref 150–399)
WBC: 13.4

## 2020-09-22 LAB — CBC
Absolute Lymphocytes: 2.01 (ref 0.65–4.75)
MCV: 90 (ref 81–99)
RBC: 4.73 (ref 3.87–5.11)

## 2020-09-22 NOTE — Telephone Encounter (Signed)
Per 4/28 LOS, patient scheduled for May Appt's.  Gave patient Appt Summary 

## 2020-09-22 NOTE — Progress Notes (Unsigned)
Per [employer] web-page: No PA required for JAK 2   

## 2020-09-23 ENCOUNTER — Other Ambulatory Visit: Payer: Self-pay | Admitting: Family Medicine

## 2020-09-28 ENCOUNTER — Telehealth: Payer: Self-pay
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
## 2020-09-29 ENCOUNTER — Other Ambulatory Visit: Payer: Self-pay | Admitting: Family Medicine

## 2020-09-30 ENCOUNTER — Other Ambulatory Visit: Payer: Self-pay

## 2020-09-30 MED ORDER — CLONAZEPAM 0.5 MG PO TABS: 0.5000 mg | ORAL_TABLET | Freq: Two times a day (BID) | ORAL | 1 refills | Status: DC

## 2020-10-04 ENCOUNTER — Other Ambulatory Visit: Payer: Self-pay

## 2020-10-04 MED ORDER — PRIMIDONE 50 MG PO TABS: 50.0000 mg | ORAL_TABLET | Freq: Two times a day (BID) | ORAL | 5 refills | Status: DC

## 2020-10-04 MED ORDER — MONTELUKAST SODIUM 10 MG PO TABS: 10.0000 mg | ORAL_TABLET | Freq: Every day | ORAL | 3 refills | Status: DC

## 2020-10-04 NOTE — Progress Notes (Signed)
Chronic Care Management Pharmacy Note  10/10/2020 Name:  Connie Coffey MRN:  [id number] DOB:  1955-09-01   Plan Updates:   Recommend considering dose adjustment for thyroid TSH goal or 2-3 since patient has osteoporosis.  Patient states she has been more tearful lately and admits that the move and her mom's death has been very stressful for her. We discussed options of adjusting medications or considering a counselor or support group. Patient will schedule with Dr. Tobie Poet if she feels medication adjustment is needed.   Subjective: Connie Coffey is an 65 y.o. year old female who is a primary patient of Cox, Kirsten, MD.  The CCM team was consulted for assistance with disease management and care coordination needs.    Engaged with patient by telephone for initial visit in response to provider referral for pharmacy case management and/or care coordination services.   Consent to Services:  The patient was given the following information about Chronic Care Management services today, agreed to services, and gave verbal consent: 1. CCM service includes personalized support from designated clinical staff supervised by the primary care provider, including individualized plan of care and coordination with other care providers 2. 24/7 contact phone numbers for assistance for urgent and routine care needs. 3. Service will only be billed when office clinical staff spend 20 minutes or more in a month to coordinate care. 4. Only one practitioner may furnish and bill the service in a calendar month. 5.The patient may stop CCM services at any time (effective at the end of the month) by phone call to the office staff. 6. The patient will be responsible for cost sharing (co-pay) of up to 20% of the service fee (after annual deductible is met). Patient agreed to services and consent obtained.  Patient Care Team: [name], MD as PCP - General (Family Medicine) Revankar, Reita Cliche, MD as PCP - Cardiology  (Cardiology) Jackquline Denmark, MD as PCP - Gastroenterology (Gastroenterology) Burnice Logan, [facility] as Pharmacist (Pharmacist)  Recent office visits:  09/07/20-Labs-CBC: WBC trending down 14.1 from 17.2 week ago, Platelets also decreased but remain elevated 614 down from 802. Will discuss hematology referral.   08/31/20- Office visit [name] NP for Gastroenteritis. Labs and urine culture  performed. Started Ondansetron 37m ODT 1 po q 8hr prn nausea.  UKoreabreast ordered. Started Levofloxacin 5015m1 po qd x 7 days. Patient advised to rest and push fluids and eat a bland diet.   07/26/20- Dr KrDirk DressPCP) for depression and hyperlipidemia.  Labs ordered.   Referral to Urology. Discontinued Lidocaine patch 5%, ciprofloxacin 50035mand trazodone 100m1mPatient advised to followup with endocrinology for Adrenal insufficiency.   07/04/20- Dr KrisDirk DressP)- for acute cystitis with hematuria.  Started Bactrim DS 800-160mg71mo bid.  Discontinued Cefdinir 300mg 62mLevofloacin 250mg. 61mne culture performed.   06/08/20- Dr KristenDirk Dress for acute cystitis with hematuria.  Started Levaquin 250mg 1 58m 3 days and increased strength of Phenazopyridine HCL 100mg tid17mPhenazopyridine /HCL 200mg tid.78mscontinued Bactrim DS 800-160mg 1 po 76m Urine culture performed.   05/30/20- Shannon HeaJerrell BelfastUrine culture performed. Started Phenazopyridine HCL 100mg tid an36mctrim DS 800-160mg 1 bid. 23m/2/21- Dr Kristen Cox (Dirk Dress vaccine  04/26/20- Dr Kristen Cox (Dirk Dressordered and referrral to Podiatry.  Discontinued Breztri 160-9-4.8 MCG.  Prolia pending approval, not started.    Recent consult visits:   09/22/20-Dr Dequincy LewiLavera Guise  No medication changes.  09/20/20- [facility]- results of Lumbar MRI  09/14/20- [facility]- followup right side steroid injection.  08/03/20- Landis Martins DPM (Podiatry)- closed displaced fracture of cuboid  of right foot with routine healing.  Patient reported recent back cyst drainage with injection. Right foot x ray performed. Patient advised that she may slowly wean from ankle gauntlet brace however on [place] walks or stand may want to occasionally use it for extra protection and stability.  Patient advised to continue with follow-up with back doctorlike previous.  Patient advised to continue with cane to provide additional stability in gait or walker like previous.  Patient to return prn.    06/22/20- Landis Martins DPM (podiaty)- closed displaced fracture of cuboid of right foot with routine healing. Right foot x ray performed. Patient reports upcoming MRI of back  and possible back surgery. Continue with ankle gauntlet area fracture cuboid that is slowly healing. Patient is not a candidate for immobilization cam boot or surgical shoe due to significant sciatica and gait disturbance.  Patient advised to continue with follow-up with back doctor Patient advised to continue with cane to provide additional stability in gait or walkerlike previously recommended to prevent against fallsPatient to return to officex-ray of right foot to check cuboid fracture however it is healing very well at this time.  06/07/20-[facility]- to discuss lower back and right posterior thigh pain. No medication changes.               05/11/20- Landis Martins DPM (Podiatry)- for Closed displaced fracture of cuboid of right foot.  Right foot x ray performed. Encounter noted appliedankle gauntlet for patient to use to keep pressure off the peroneal tendon course and the lateral foot at area of fracture, Due to patient low back pain sciatic issues patient is not a candidate for a boot or surgical shoe.  Patient was recommend protection, rest, ice, elevation daily until symptoms improve and to continue with over-the-counter pain medicines as needed.  Patient advised to continue with nerve medicines of gabapentin and Cymbalta  as previous prescribed and to continue with cane to provide additional stability in gait or walker.  Patient to return to office in 4to 6weeks for serial x-rays to assess healing or sooner if condition worsens.  Hospital visits: None in previous 6 months  Objective:  Lab Results  Component Value Date   CREATININE 0.62 08/31/2020   BUN 12 08/31/2020   GFRNONAA 79 04/26/2020   GFRAA 91 04/26/2020   NA 141 08/31/2020   K 3.8 08/31/2020   CALCIUM 9.3 08/31/2020   CO2 28 08/31/2020   GLUCOSE 99 08/31/2020    No results found for: HGBA1C, FRUCTOSAMINE, GFR, MICROALBUR  Last diabetic Eye exam: No results found for: HMDIABEYEEXA  Last diabetic Foot exam: No results found for: HMDIABFOOTEX   Lab Results  Component Value Date   CHOL 183 07/26/2020   HDL 82 07/26/2020   LDLCALC 83 07/26/2020   TRIG 102 07/26/2020   CHOLHDL 2.2 07/26/2020    Hepatic Function Latest Ref Rng & Units 08/31/2020 07/26/2020 04/26/2020  Total Protein 6.0 - 8.5 g/dL 6.0 6.5 6.2  Albumin 3.8 - 4.8 g/dL 3.9 4.4 4.3  AST 0 - 40 IU/L '19 18 17  ' ALT 0 - 32 IU/L '22 19 21  ' Alk Phosphatase 44 - 121 IU/L 52 37(L) 38(L)  Total Bilirubin 0.0 - 1.2 mg/dL 0.2 0.2 0.3    Lab Results  Component Value Date/Time   TSH 0.335 (L)  07/26/2020 11:17 AM   TSH 0.902 01/15/2020 11:10 AM   FREET4 1.33 07/26/2020 11:17 AM    CBC Latest Ref Rng & Units 09/22/2020 09/07/2020 08/31/2020  WBC - 13.4 14.1(H) 17.1(H)  Hemoglobin 12.0 - 16.0 13.7 11.9 11.5  Hematocrit 36 - 46 43 37.1 36.3  Platelets 150 - 399 524(A) 614(H) 802(HH)    Lab Results  Component Value Date/Time   VD25OH 55.0 07/26/2020 11:17 AM    Clinical ASCVD: No  The 10-year ASCVD risk score [name] DC Jr., et al., 2013) is: 8.8%   Values used to calculate the score:     Age: 1 years     Sex: Female     Is Non-Hispanic African American: No     Diabetic: No     Tobacco smoker: Yes     Systolic Blood Pressure: 875 mmHg     Is BP treated: No     HDL  Cholesterol: 82 mg/dL     Total Cholesterol: 183 mg/dL    Depression screen [facility] 2/9 10/06/2020 04/26/2020 01/17/2020  Decreased Interest 1 0 2  Down, Depressed, Hopeless 2 0 2  PHQ - 2 Score 3 0 4  Altered sleeping - 0 2  Tired, decreased energy '1 2 2  ' Change in appetite - 1 2  Feeling bad or failure about yourself  - 0 2  Trouble concentrating - 1 2  Moving slowly or fidgety/restless - 1 1  Suicidal thoughts - 0 0  PHQ-9 Score - 5 15  Difficult doing work/chores - Somewhat difficult -     Social History   Tobacco Use  Smoking Status Current Every Day Smoker  . Packs/day: 1.50  . Types: Cigarettes  Smokeless Tobacco Never Used   BP Readings from Last 3 Encounters:  09/22/20 126/66  08/31/20 110/82  07/26/20 108/72   Pulse Readings from Last 3 Encounters:  09/22/20 92  08/31/20 (!) 104  07/26/20 (!) 31   Wt Readings from Last 3 Encounters:  09/22/20 139 lb (63 kg)  08/31/20 138 lb (62.6 kg)  07/26/20 142 lb (64.4 kg)   BMI Readings from Last 3 Encounters:  09/22/20 26.26 kg/m  08/31/20 24.45 kg/m  07/26/20 25.15 kg/m    Assessment/Interventions: Review of patient past medical history, allergies, medications, health status, including review of consultants reports, laboratory and other test data, was performed as part of comprehensive evaluation and provision of chronic care management services.   SDOH:  (Social Determinants of Health) assessments and interventions performed: Yes  SDOH Screenings   Alcohol Screen: Not on file  Depression (PHQ2-9): Low Risk   . PHQ-2 Score: 4  Financial Resource Strain: Not on file  Food Insecurity: Not on file  Housing: Low Risk   . Last Housing Risk Score: 0  Physical Activity: Not on file  Social Connections: Not on file  Stress: Not on file  Tobacco Use: High Risk  . Smoking Tobacco Use: Current Every Day Smoker  . Smokeless Tobacco Use: Never Used  Transportation Needs: No Transportation Needs  . Lack of  Transportation (Medical): No  . Lack of Transportation (Non-Medical): No    CCM Care Plan  Allergies  Allergen Reactions  . Celebrex [Celecoxib]     "because of stomach ulcers"  . Chantix [Varenicline]   . Fish Oil Other (See Comments)    hemorrhoids  . Gramineae Pollens   . Nsaids Other (See Comments)    GI Upset. Ulcers  . Other     beets  .  Nicotrol [Nicotine] Nausea Only  . Penicillins Rash    Medications Reviewed Today    Reviewed by Burnice Logan, [facility] (Pharmacist) on 10/10/20 at [phone]  Med List Status: <None>  Medication Order Taking? Sig Documenting Provider Last Dose Status Informant  albuterol (VENTOLIN HFA) 108 (90 Base) MCG/ACT inhaler [id number] Yes Inhale 2 puffs into the lungs every 4 (four) hours as needed. [provider] Taking Active   alendronate (FOSAMAX) 70 MG tablet [id number] Yes Take 1 tablet by mouth once a week. Amalia Greenhouse, MD Taking Active   atorvastatin (LIPITOR) 40 MG tablet [id number] Yes TAKE 1 TABLET BY MOUTH  DAILY Cox, Kirsten, MD Taking Active   bethanechol (URECHOLINE) 25 MG tablet [id number] Yes Take 25 mg by mouth in the morning and at bedtime. [provider] Taking Active   busPIRone (BUSPAR) 30 MG tablet [id number] Yes TAKE 1 TABLET BY MOUTH  TWICE DAILY Cox, Kirsten, MD Taking Active   clonazePAM (KLONOPIN) 0.5 MG tablet [id number] Yes Take 1 tablet (0.5 mg total) by mouth 2 (two) times daily. Cox, Kirsten, MD Taking Active   cyclobenzaprine (FLEXERIL) 10 MG tablet [id number] Yes Take 10 mg by mouth 3 (three) times daily. [provider] Taking Active   D-Mannose 500 MG CAPS [id number] Yes Take 1 capsule by mouth in the morning and at bedtime. [provider] Taking Active   diclofenac sodium (VOLTAREN) 1 % GEL [id number] No Apply 1 application topically daily as needed.  Patient not taking: Reported on 10/06/2020   Dorna Leitz, MD Not Taking Active   DULoxetine (CYMBALTA) 60 MG capsule [id number] Yes Take 1  capsule by mouth daily. Dorna Leitz, MD Taking Active   DYMISTA 137-50 MCG/ACT SUSP [id number] Yes One spray each nostril twice a day. [name], MD Taking Active   fenofibrate 160 MG tablet [id number] Yes Take 1 tablet (160 mg total) by mouth daily. Marge Duncans, PA-C Taking Active   gabapentin (NEURONTIN) 600 MG tablet [id number] Yes Take 1 tablet by mouth 4 (four) times daily. Dorna Leitz, MD Taking Expired 10/06/20 2359   hydrocortisone (CORTEF) 20 MG tablet [id number] Yes Take 1 tablet in the morning and 1/2 tablet at 4pm Jones, Autumn Sharie, PA-C Taking Active   levocetirizine (XYZAL) 5 MG tablet [id number] Yes TAKE 1 TABLET BY MOUTH EVERY DAY IN THE Newell Coral, MD Taking Active   levothyroxine (SYNTHROID, LEVOTHROID) 50 MCG tablet [id number] Yes Take 1 tablet by mouth every morning. Riccardo Dubin, PA-C Taking Active   lubiprostone (AMITIZA) 8 MCG capsule [id number] Yes TAKE 1 CAPSULE (8 MCG TOTAL) BY MOUTH 2 (TWO) TIMES DAILY WITH A MEAL. Cox, Kirsten, MD Taking Active   montelukast (SINGULAIR) 10 MG tablet [id number]  Take 1 tablet (10 mg total) by mouth daily. [name], MD  Active   Multiple Vitamin (MULTI-VITAMINS) TABS [id number] Yes Take 1 tablet by mouth daily. [provider] Taking Active   naloxone [facility]) nasal spray 4 mg/0.1 mL [id number] No Place 1 spray into the nose once. (for suspected overdose)  Patient not taking: Reported on 10/06/2020   [provider] Not Taking Active   olopatadine (PATANOL) 0.1 % ophthalmic solution [id number] No Use 1 drop in each eye twice daily as needed  Patient not taking: Reported on 10/06/2020   [name], MD Not Taking Active   omeprazole (PRILOSEC) 40 MG capsule [id number] Yes TAKE 1 CAPSULE BY MOUTH  TWICE DAILY Cox, Kirsten, MD Taking Active   ondansetron [facility]  ODT) 4 MG disintegrating tablet [id number] No Take 1 tablet (4 mg total) by mouth every 8 (eight) hours as needed for nausea or  vomiting.  Patient not taking: Reported on 10/06/2020   Rip Harbour, NP Not Taking Active   oxyCODONE-acetaminophen (PERCOCET) 10-325 MG tablet [id number] Yes TAKE 1 TAB EVERY 6 HOURS FOR 5 DAYS THEN 1 TAB TWICE A DAY AS NEEDED FOR SEVERE BREAKTHROUGH PAIN Rolena Infante, Jacinto Reap, MD Taking Active   primidone (MYSOLINE) 50 MG tablet [id number] Yes Take 1 tablet (50 mg total) by mouth 2 (two) times daily. Cox, Kirsten, MD Taking Active   Tiotropium Bromide Monohydrate (SPIRIVA RESPIMAT) 2.5 MCG/ACT AERS [id number] Yes Inhale 1 puff into the lungs 2 (two) times daily. [provider] Taking Active   Vitamin D, Ergocalciferol, (DRISDOL) 50000 units CAPS capsule [id number] Yes Take 1 capsule by mouth once a week. Riccardo Dubin, PA-C Taking Active   ZTLIDO 1.8 % [facility] [id number] Yes SMARTSIG:1 Patch(s) Topical As Needed [provider] Taking Active           Patient Active Problem List   Diagnosis Date Noted  . Simple chronic bronchitis ([place]) 02/16/2020  . Acute sinusitis 12/29/2019  . Tick bite of left side of chest wall 11/10/2019  . Mixed hyperlipidemia 10/19/2019  . Adrenal insufficiency (Addison's disease) ([place]) 10/19/2019  . Obstructive chronic bronchitis with exacerbation ([place]) 09/08/2019  . Essential (primary) hypertension 08/25/2019  . Sciatica of right side associated with disorder of lumbar spine 07/18/2019  . Acute serous otitis media of left ear 07/18/2019  . History of colonic polyps 02/16/2019  . Thoracic degenerative disc disease 02/09/2019  . History of hyperparathyroidism 12/22/2018  . Lumbar radiculopathy 09/03/2018  . Lung nodules 07/18/2018  . Centrilobular emphysema ([place]) 03/11/2018  . Cigarette smoker 03/11/2018  . Cervical radiculitis 11/21/2017  . Chronic pain syndrome 04/15/2017  . Depression with anxiety 03/21/2017  . Age-related osteoporosis without current pathological fracture 01/18/2017  . Acquired hypothyroidism 09/22/2015  .  Hyperparathyroidism ([place]) 09/22/2015  . Vitamin D deficiency 09/22/2015  . Anxiety state 08/06/2013  . Left carpal tunnel syndrome 06/04/2013  . Neck pain 01/21/2013  . Low back pain 11/21/2012  . Chronic airway obstruction ([place]) 11/01/2012    Immunization History  Administered Date(s) Administered  . Influenza-Unspecified 01/26/2018, 02/06/2019, 02/05/2020  . PFIZER(Purple Top)SARS-COV-2 Vaccination 01/30/2020, 04/28/2020  . Pneumococcal Conjugate-13 12/16/2014  . Pneumococcal Polysaccharide-23 03/26/2013  . Td 03/13/2015    Conditions to be addressed/monitored:  Hypertension, Hyperlipidemia, Heart Failure, COPD, Hypothyroidism, Anxiety, Osteoporosis and Chronic Pain, Vitamin D Deficiency  Care Plan : CCM Pharmacy Care Plan  Updates made by Burnice Logan, [place] since 10/10/2020 12:00 AM    Problem: copd, htn, hld, osteoporosis   Priority: High  Onset Date: 10/06/2020    [place] Goal: Disease State Management   Start Date: 10/06/2020  Expected End Date: 10/06/2021  This Visit's Progress: On track  Priority: High  Note:    Current Barriers:  . Unable to achieve control of osteoporosis   Pharmacist Clinical Goal(s):  Marland Kitchen Patient will achieve control of osteoporosis as evidenced by dexa scan through collaboration with PharmD and provider.   Interventions: . 1:1 collaboration with [name], MD regarding development and update of comprehensive plan of care as evidenced by provider attestation and co-signature . Inter-disciplinary care team collaboration (see longitudinal plan of care) . Comprehensive medication review performed; medication list updated in electronic medical record  Hyperlipidemia: (LDL goal < 55) -Not ideally controlled -Current  treatment: . atorvastatin 40 mg daily  . Fenofibrate 160 mg daily  -Medications previously tried: none reported  -Current dietary patterns:  barbecue chicken, red potatoes, green beans, carrots for supper the other night. Fruit.  Avoids fried foods.  -Current exercise habits: limited due to pinched nerve -Educated on Cholesterol goals;  Benefits of statin for ASCVD risk reduction; Importance of limiting foods high in cholesterol; -Counseled on diet and exercise extensively Recommended to continue current medication  COPD (Goal: control symptoms and prevent exacerbations) -Controlled -Current treatment  . albuterol inhaler 2 puffs every 4 hours prn - using 2-3 times weekly during allergy season . Dymista 137-50 mcg/act 1 spray each nostril twice daily  . Montelukast 10 mg daily  . Spiriva 2.5 mcg/act 1 puff twice daily  -Medications previously tried:  Librarian, academic, cetirizine, Trelegy,  -Gold Grade: Gold 2 (FEV1 50-79%)  -Pulmonary function testing: ratio 53%, FEV1 56%, FVC 84% -Exacerbations requiring treatment in last 6 months: none -Patient reports consistent use of maintenance inhaler -Frequency of rescue inhaler use: 2-3 times weekly during allergy season  -Counseled on [place] inhaler technique; Benefits of consistent maintenance inhaler use -Recommended to continue current medication  Depression/Anxiety (Goal: manage symptoms of depression) -Not ideally controlled -Current treatment: . buspirone 30 mg bid  . duloxetine 60 mg daily  . Clonazepam 0.5 mg bid  -Medications previously tried/failed: paroxetine, trazodone  -PHQ9: 4 -Educated on Benefits of medication for symptom control -Recommended to continue current medication Recommended scheduling visit with Dr. Tobie Poet if needed for depression and symptoms of being more teary lately. Discussed the benefits of a grief support group after the loss of her mom and settling the estate/moving.   Tobacco use (Goal reduce smoking) -Uncontrolled -Previous quit attempts: cold [place], chantix (made her sick), patches -Current treatment  . None reported -Patient smokes Within 30 minutes of waking -Patient triggers include: stress and anxiety -On a scale of 1-10,  reports MOTIVATION to quit is 1 -On a scale of 1-10, reports CONFIDENCE in quitting is 1 -Provided contact information for [place] Quit Line (1-800-QUIT-NOW) and encouraged patient to reach out to this group for support. -Counseled on importance of smoking cessation.   Osteoporosis / Osteopenia (Goal reduce risk of fracture ) -Uncontrolled -Last DEXA Scan:  04/06/2020  T-Score femoral neck: -2.1   T-Score forearm radius:  - 3.9  -Patient is a candidate for pharmacologic treatment due to T-Score < -2.5 in femoral neck -Current treatment  . alendronate 70 mg weekly  . Vitamin D 50,000 units weekly  -Medications previously tried:  [place] -Recommend [phone] units of vitamin D daily. Recommend 1200 mg of calcium daily from dietary and supplemental sources. Counseled on oral bisphosphonate administration: take in the morning, 30 minutes prior to food with 6-8 oz of water. Do not lie down for at least 30 minutes after taking. Recommend weight-bearing and muscle strengthening exercises for building and maintaining bone density.  -Counseled on diet and exercise extensively Recommended to continue current medication Counseled on calcium supplementation   Chronic Pain (Goal: manage pain) -Not ideally controlled -Current treatment  . Oxycodone-acetaminophen 10-325 mg every 6 hours then twice daily prn for severe breakthrough pain  . Naloxone nasal spray 4 mg/0.1 ml 1 spray into the nose once . Gabapentin 600 mg qid . primidone 60 mg bid  . Cyclobenzaprine 10 mg tid  . voltaren 1% gel daily prn  . Lidocaine patch topically as needed -Medications previously tried: none reported  -Counseled on diet and exercise extensively Counseled  on calcium intake and the importance while on alendronate.   IBS/GERD (Goal: improve symptoms management) -Controlled -Current treatment  . Omeprazole 40 mg bid . Amitiza 8 mcg bid with a meal -Medications previously tried: ondansetron  -Recommended to continue  current medication  Fibromyalgia (Goal: minimize flares/pain) -Controlled -Current treatment  . duloxetine 60 mg daily -Medications previously tried: none reported  -Recommended to continue current medication  Hypothyroidism (Goal: TSH 2-3) -Not ideally controlled -Current treatment  . levothyroxine 50 mcg daily in the morning  -Medications previously tried: none reported  -Recommended considering dose adjustment for goal TSH 2-3.   Health Maintenance -Vaccine gaps: has had 2 COVID shots. Needs to get shingles shot.  -Current therapy:  . hydrocortisone 20 mg am and 1/2 tablet at 4pm  . Multiple vitamin daily  -Educated on benefit of Shingles shot.  -Patient is satisfied with current therapy and denies issues -Counseled on diet and exercise extensively Counseled on shingles shot and recommended patient stop by pharmacy.  Recommended patient ask for hydrocortisone 10 mg tablets from endocrinology when she goes in 2 weeks. Patient reports that hydrocortisone is crumbling when she cuts it in half.    Patient Goals/Self-Care Activities . Patient will:  - take medications as prescribed focus on medication adherence by using pill box  Follow Up Plan: Telephone follow up appointment with care management team member scheduled for: 03/2021      Medication Assistance: None required.  Patient affirms current coverage meets needs.  Patient's preferred pharmacy is:  [place], [address], [address] [place], [address] [place] [address] Phone: [phone] Fax: [phone]  [id number] [id number]: [phone]: [phone] Uses pill box? Yes - has four times daily dosing in pill box Pt endorses good compliance  We discussed: Current pharmacy is preferred with insurance plan and patient is satisfied with pharmacy services Patient decided  to: Continue current medication management strategy  Care Plan and Follow Up Patient Decision:  Patient agrees to Care Plan and Follow-up.  Plan: Telephone follow up appointment with care management team member scheduled for:  03/2021

## 2020-10-06 ENCOUNTER — Ambulatory Visit (INDEPENDENT_AMBULATORY_CARE_PROVIDER_SITE_OTHER): Payer: Medicare Other

## 2020-10-06 ENCOUNTER — Other Ambulatory Visit: Payer: Self-pay

## 2020-10-06 DIAGNOSIS — F330 Major depressive disorder, recurrent, mild: Secondary | ICD-10-CM | POA: Diagnosis not present

## 2020-10-06 DIAGNOSIS — F17218 Nicotine dependence, cigarettes, with other nicotine-induced disorders: Secondary | ICD-10-CM

## 2020-10-06 DIAGNOSIS — M810 Age-related osteoporosis without current pathological fracture: Secondary | ICD-10-CM

## 2020-10-06 DIAGNOSIS — J410 Simple chronic bronchitis: Secondary | ICD-10-CM | POA: Diagnosis not present

## 2020-10-06 DIAGNOSIS — E039 Hypothyroidism, unspecified: Secondary | ICD-10-CM | POA: Diagnosis not present

## 2020-10-06 DIAGNOSIS — E782 Mixed hyperlipidemia: Secondary | ICD-10-CM | POA: Diagnosis not present

## 2020-10-07 ENCOUNTER — Other Ambulatory Visit: Payer: Self-pay

## 2020-10-07 MED ORDER — MONTELUKAST SODIUM 10 MG PO TABS: 10.0000 mg | ORAL_TABLET | Freq: Every day | ORAL | 3 refills | Status: DC

## 2020-10-07 MED ORDER — PRIMIDONE 50 MG PO TABS: 50.0000 mg | ORAL_TABLET | Freq: Two times a day (BID) | ORAL | 5 refills | Status: DC

## 2020-10-10 DIAGNOSIS — D75839 Thrombocytosis, unspecified: Secondary | ICD-10-CM | POA: Insufficient documentation

## 2020-10-10 DIAGNOSIS — R7989 Other specified abnormal findings of blood chemistry: Secondary | ICD-10-CM | POA: Insufficient documentation

## 2020-10-10 DIAGNOSIS — D72829 Elevated white blood cell count, unspecified: Secondary | ICD-10-CM | POA: Insufficient documentation

## 2020-10-10 LAB — JAK2 V617F, W REFLEX TO CALR/E12/MPL

## 2020-10-10 LAB — CALR + JAK2 E12-15 + MPL (REFLEXED)

## 2020-10-10 NOTE — Patient Instructions (Addendum)
Visit Information  Thank you for your time discussing your medications. I look forward to working with you to achieve your health care goals. Below is a summary of what we talked about during our visit.   Goals Addressed            This Visit's Progress   . Learn More About My Health       Timeframe:  [place] Goal Priority:  High Start Date:                             Expected End Date:                        Follow Up Date 03/2021    - tell my story and reason for my visit - make a list of questions - ask questions - repeat what I heard to make sure I understand - bring a list of my medicines to the visit - speak up when I don't understand    Why is this important?    The best way to learn about your health and care is by talking to the doctor and nurse.   They will answer your questions and give you information in the way that you like best.    Notes:     Marland Kitchen Manage My Medicine       Timeframe:  [place] Goal Priority:  High Start Date:                             Expected End Date:                       Follow Up Date 03/2021    - call for medicine refill 2 or 3 days before it runs out - keep a list of all the medicines I take; vitamins and herbals too - use a pillbox to sort medicine    Why is this important?   . These steps will help you keep on track with your medicines.   Notes:     . Prevent Falls and Broken Bones-Osteoporosis       Timeframe:  [place] Goal Priority:  High Start Date:                             Expected End Date:                       Follow Up Date 03/2021    - always use handrails on the stairs - get at least 10 minutes of activity every day - pick up clutter from the floors    Why is this important?    When you fall, there are 3 things that control if a bone breaks or not.   These are the fall itself, how hard and the direction that you fall and how fragile your bones are.   Preventing falls is very important for  you because of fragile bones.     Notes:        Patient Care Plan: CCM Pharmacy Care Plan    Problem Identified: copd, htn, hld, osteoporosis   Priority: High  Onset Date: 10/06/2020    [place] Goal: Disease State Management   Start Date: 10/06/2020  Expected End Date: 10/06/2021  This Visit's  Progress: On track  Priority: High       Connie Coffey was given information about Chronic Care Management services today including:  1. CCM service includes personalized support from designated clinical staff supervised by her physician, including individualized plan of care and coordination with other care providers 2. 24/7 contact phone numbers for assistance for urgent and routine care needs. 3. Standard insurance, coinsurance, copays and deductibles apply for chronic care management only during months in which we provide at least 20 minutes of these services. Most insurances cover these services at 100%, however patients may be responsible for any copay, coinsurance and/or deductible if applicable. This service may help you avoid the need for more expensive face-to-face services. 4. Only one practitioner may furnish and bill the service in a calendar month. 5. The patient may stop CCM services at any time (effective at the end of the month) by phone call to the office staff.  Patient agreed to services and verbal consent obtained.   The patient verbalized understanding of instructions, educational materials, and care plan provided today and declined offer to receive copy of patient instructions, educational materials, and care plan.  Telephone follow up appointment with pharmacy team member scheduled for: 03/2021  Sherre Poot, PharmD Clinical Pharmacist [facility] [phone] (office) [phone] (mobile)  Smoking Tobacco Information, Adult Smoking tobacco can be harmful to your health. Tobacco contains a poisonous (toxic), colorless chemical called nicotine. Nicotine is  addictive. It changes the brain and can make it hard to stop smoking. Tobacco also has other toxic chemicals that can hurt your body and raise your risk of many cancers. How can smoking tobacco affect me? Smoking tobacco puts you at risk for:  Cancer. Smoking is most commonly associated with lung cancer, but can also lead to cancer in other parts of the body.  Chronic obstructive pulmonary disease (COPD). This is a [place]-term lung condition that makes it hard to breathe. It also gets worse over time.  High blood pressure (hypertension), heart disease, stroke, or heart attack.  Lung infections, such as pneumonia.  Cataracts. This is when the lenses in the eyes become clouded.  Digestive problems. This may include peptic ulcers, heartburn, and gastroesophageal reflux disease (GERD).  Oral health problems, such as gum disease and tooth loss.  Loss of taste and smell. Smoking can affect your appearance by causing:  Wrinkles.  Yellow or stained teeth, fingers, and fingernails. Smoking tobacco can also affect your social life, because:  It may be challenging to find places to smoke when away from home. Many workplaces, [employer], hotels, and public places are tobacco-free.  Smoking is expensive. This is due to the cost of tobacco and the [place]-term costs of treating health problems from smoking.  Secondhand smoke may affect those around you. Secondhand smoke can cause lung cancer, breathing problems, and heart disease. Children of smokers have a higher risk for: ? Sudden infant death syndrome (SIDS). ? Ear infections. ? Lung infections. If you currently smoke tobacco, quitting now can help you:  Lead a longer and healthier life.  Look, smell, breathe, and feel better over time.  Save money.  Protect others from the harms of secondhand smoke. What actions can I take to prevent health problems? Quit smoking  Do not start smoking. Quit if you already do.  Make a plan to quit  smoking and commit to it. Look for programs to help you and ask your health care provider for recommendations and ideas.  Set a date  and write down all the reasons you want to quit.  Let your friends and family know you are quitting so they can help and support you. Consider finding friends who also want to quit. It can be easier to quit with someone else, so that you can support each other.  Talk with your health care provider about using nicotine replacement medicines to help you quit, such as gum, lozenges, patches, sprays, or pills.  Do not replace cigarette smoking with electronic cigarettes, which are commonly called e-cigarettes. The safety of e-cigarettes is not known, and some may contain harmful chemicals.  If you try to quit but return to smoking, stay positive. It is common to slip up when you first quit, so take it one day at a time.  Be prepared for cravings. When you feel the urge to smoke, chew gum or suck on hard candy.   Lifestyle  Stay busy and take care of your body.  Drink enough fluid to keep your urine pale yellow.  Get plenty of exercise and eat a healthy diet. This can help prevent weight gain after quitting.  Monitor your eating habits. Quitting smoking can cause you to have a larger appetite than when you smoke.  Find ways to relax. Go out with friends or family to a movie or a restaurant where people do not smoke.  Ask your health care provider about having regular tests (screenings) to check for cancer. This may include blood tests, imaging tests, and other tests.  Find ways to manage your stress, such as meditation, yoga, or exercise. Where to find support To get support to quit smoking, consider:  Asking your health care provider for more information and resources.  Taking classes to learn more about quitting smoking.  Looking for local organizations that offer resources about quitting smoking.  Joining a support group for people who want to quit  smoking in your local community.  Calling the smokefree.gov counselor helpline: 1-800-Quit-Now 239-602-4601) Where to find more information You may find more information about quitting smoking from:  HelpGuide.org: www.helpguide.org  https://hall.com/: smokefree.gov  American Lung Association: www.lung.org Contact a health care provider if you:  Have problems breathing.  Notice that your lips, nose, or fingers turn blue.  Have chest pain.  Are coughing up blood.  Feel faint or you pass out.  Have other health changes that cause you to worry. Summary  Smoking tobacco can negatively affect your health, the health of those around you, your finances, and your social life.  Do not start smoking. Quit if you already do. If you need help quitting, ask your health care provider.  Think about joining a support group for people who want to quit smoking in your local community. There are many effective programs that will help you to quit this behavior. This information is not intended to replace advice given to you by your health care provider. Make sure you discuss any questions you have with your health care provider. Document Revised: 02/06/2019 Document Reviewed: 05/29/2016 Elsevier Patient Education  2021 [employer].

## 2020-10-11 ENCOUNTER — Other Ambulatory Visit: Payer: Self-pay | Admitting: Family Medicine

## 2020-10-18 NOTE — Progress Notes (Signed)
[facility]  [address] [place],  [place]  [address] [phone]  Clinic Day:  10/21/2020  Referring physician: [name], MD  This document serves as a record of services personally performed by Marice Potter, MD. It was created on their behalf by [facility], a trained medical scribe. The creation of this record is based on the scribe's personal observations and the provider's statements to them.  HISTORY OF PRESENT ILLNESS:  The patient is a 65 y.o. female who I recently began seeing for elevated platelets and white cells.  She comes in today to go over labs to determine if an underlying myeloproliferative disorder is present.  Since her last visit, the patient has been doing well.  She denies having any B symptoms which concern her for her elevated counts being due to an underlying hematologic malignancy.   PHYSICAL EXAM:  Blood pressure 139/67, pulse 99, temperature 98.6 F (37 C), resp. rate 16, height '5\' 1"'  (1.549 m), weight 142 lb 4.8 oz (64.5 kg), SpO2 94 %. Wt Readings from Last 3 Encounters:  10/21/20 142 lb 4.8 oz (64.5 kg)  09/22/20 139 lb (63 kg)  08/31/20 138 lb (62.6 kg)   Body mass index is 26.89 kg/m. Performance status (ECOG): 1 - Symptomatic but completely ambulatory Physical Exam Constitutional:      Appearance: Normal appearance. She is not ill-appearing.  HENT:     Mouth/Throat:     Mouth: Mucous membranes are moist.     Pharynx: Oropharynx is clear. No oropharyngeal exudate or posterior oropharyngeal erythema.  Cardiovascular:     Rate and Rhythm: Normal rate and regular rhythm.     Heart sounds: No murmur heard. No friction rub. No gallop.   Pulmonary:     Effort: Pulmonary effort is normal. No respiratory distress.     Breath sounds: Normal breath sounds. No wheezing, rhonchi or rales.  Chest:  Breasts:     Right: No axillary adenopathy or supraclavicular adenopathy.     Left: No axillary adenopathy or  supraclavicular adenopathy.    Abdominal:     General: Bowel sounds are normal. There is no distension.     Palpations: Abdomen is soft. There is no mass.     Tenderness: There is no abdominal tenderness.  Musculoskeletal:        General: No swelling.     Right lower leg: No edema.     Left lower leg: No edema.  Lymphadenopathy:     Cervical: No cervical adenopathy.     Upper Body:     Right upper body: No supraclavicular or axillary adenopathy.     Left upper body: No supraclavicular or axillary adenopathy.     Lower Body: No right inguinal adenopathy. No left inguinal adenopathy.  Skin:    General: Skin is warm.     Coloration: Skin is not jaundiced.     Findings: No lesion or rash.  Neurological:     General: No focal deficit present.     Mental Status: She is alert and oriented to person, place, and time. Mental status is at baseline.     Cranial Nerves: Cranial nerves are intact.  Psychiatric:        Mood and Affect: Mood normal.        Behavior: Behavior normal.        Thought Content: Thought content normal.    LABS:   CBC Latest Ref Rng & Units 10/21/2020 09/22/2020 09/07/2020  WBC -  11.7 13.4 14.1(H)  Hemoglobin 12.0 - 16.0 13.9 13.7 11.9  Hematocrit 36 - 46 43 43 37.1  Platelets 150 - 399 529(A) 524(A) 614(H)   CMP Latest Ref Rng & Units 08/31/2020 07/26/2020 04/26/2020  Glucose 65 - 99 mg/dL 99 103(H) 91  BUN 8 - 27 mg/dL '12 12 12  ' Creatinine 0.57 - 1.00 mg/dL 0.62 0.80 0.79  Sodium 134 - 144 mmol/L 141 144 143  Potassium 3.5 - 5.2 mmol/L 3.8 4.2 4.4  Chloride 96 - 106 mmol/L 93(L) 104 102  CO2 20 - 29 mmol/L '28 24 28  ' Calcium 8.7 - 10.3 mg/dL 9.3 9.6 9.9  Total Protein 6.0 - 8.5 g/dL 6.0 6.5 6.2  Total Bilirubin 0.0 - 1.2 mg/dL 0.2 0.2 0.3  Alkaline Phos 44 - 121 IU/L 52 37(L) 38(L)  AST 0 - 40 IU/L '19 18 17  ' ALT 0 - 32 IU/L '22 19 21    ' CALR Mutation Detection Result Comment   Comment: (NOTE)  NEGATIVE  No insertions or deletions were detected within the  analyzed region  of the calreticulin (CALR) gene.    JAK2 GenotypR Comment   Comment: (NOTE)  Result: NEGATIVE for the JAK2 [id number] mutation.    JAK2 Exons 12-15 Mut Det PCR: Comment   Comment: (NOTE)  NEGATIVE  JAK2 mutations were not detected in exons 12, 13, 14 and 15.     ASSESSMENT & PLAN:  A 65 y.o. female who I recently began seeing for an elevated white count and platelets.  Based upon all of her recent labs, she tested negative for having a potential myeloproliferative disorder.  Although her white cells and platelets are high, they are lower versus previous visits.  Furthermore, none of her counts is alarmingly high to where intervention appears necessary.  Her leukocytosis and thrombocythemia will continued to be followed conservatively.  I will see her back in 6 months for repeat clinical assessment.  If her counts precipitously rise over time, a bone marrow biopsy would be considered.  The patient understands all the plans discussed today and is in agreement with them.   I, Rita Ohara, am acting as scribe for Marice Potter, MD    I have reviewed this report as typed by the medical scribe, and it is complete and accurate.  Dequincy Macarthur Critchley, MD

## 2020-10-19 DIAGNOSIS — R339 Retention of urine, unspecified: Secondary | ICD-10-CM | POA: Diagnosis not present

## 2020-10-19 DIAGNOSIS — N390 Urinary tract infection, site not specified: Secondary | ICD-10-CM | POA: Diagnosis not present

## 2020-10-21 ENCOUNTER — Inpatient Hospital Stay: Payer: Medicare Other | Attending: Oncology

## 2020-10-21 ENCOUNTER — Other Ambulatory Visit: Payer: Self-pay

## 2020-10-21 ENCOUNTER — Encounter: Payer: Self-pay | Admitting: Oncology

## 2020-10-21 ENCOUNTER — Telehealth: Payer: Self-pay | Admitting: Oncology

## 2020-10-21 ENCOUNTER — Inpatient Hospital Stay (INDEPENDENT_AMBULATORY_CARE_PROVIDER_SITE_OTHER): Payer: Medicare Other | Admitting: Oncology

## 2020-10-21 ENCOUNTER — Other Ambulatory Visit: Payer: Self-pay | Admitting: Oncology

## 2020-10-21 VITALS — BP 139/67 | HR 99 | Temp 98.6°F | Resp 16 | Ht 61.0 in | Wt 142.3 lb

## 2020-10-21 DIAGNOSIS — D72829 Elevated white blood cell count, unspecified: Secondary | ICD-10-CM

## 2020-10-21 DIAGNOSIS — R7989 Other specified abnormal findings of blood chemistry: Secondary | ICD-10-CM | POA: Diagnosis not present

## 2020-10-21 DIAGNOSIS — D649 Anemia, unspecified: Secondary | ICD-10-CM | POA: Diagnosis not present

## 2020-10-21 LAB — CBC AND DIFFERENTIAL
HCT: 43 (ref 36–46)
Hemoglobin: 13.9 (ref 12.0–16.0)
Neutrophils Absolute: 7.25
Platelets: 529 — AB (ref 150–399)
WBC: 11.7

## 2020-10-21 LAB — CBC: RBC: 4.86 (ref 3.87–5.11)

## 2020-10-21 NOTE — Telephone Encounter (Signed)
Per 5/27 LOS, patient scheduled for Nov Appt's.  Gave patient Appt Summary

## 2020-10-28 ENCOUNTER — Ambulatory Visit (INDEPENDENT_AMBULATORY_CARE_PROVIDER_SITE_OTHER): Payer: 59 | Admitting: Family Medicine

## 2020-10-28 ENCOUNTER — Other Ambulatory Visit: Payer: Self-pay

## 2020-10-28 ENCOUNTER — Encounter: Payer: Self-pay | Admitting: Family Medicine

## 2020-10-28 VITALS — BP 108/78 | HR 100 | Temp 97.4°F | Resp 18 | Ht 61.0 in | Wt 141.2 lb

## 2020-10-28 DIAGNOSIS — R55 Syncope and collapse: Secondary | ICD-10-CM

## 2020-10-28 DIAGNOSIS — G894 Chronic pain syndrome: Secondary | ICD-10-CM

## 2020-10-28 DIAGNOSIS — M5386 Other specified dorsopathies, lumbar region: Secondary | ICD-10-CM

## 2020-10-28 DIAGNOSIS — E039 Hypothyroidism, unspecified: Secondary | ICD-10-CM | POA: Diagnosis not present

## 2020-10-28 DIAGNOSIS — D696 Thrombocytopenia, unspecified: Secondary | ICD-10-CM

## 2020-10-28 DIAGNOSIS — F1120 Opioid dependence, uncomplicated: Secondary | ICD-10-CM

## 2020-10-28 DIAGNOSIS — D75839 Thrombocytosis, unspecified: Secondary | ICD-10-CM | POA: Diagnosis not present

## 2020-10-28 DIAGNOSIS — E782 Mixed hyperlipidemia: Secondary | ICD-10-CM

## 2020-10-28 DIAGNOSIS — F330 Major depressive disorder, recurrent, mild: Secondary | ICD-10-CM

## 2020-10-28 LAB — CBC WITH DIFFERENTIAL/PLATELET
Basophils Absolute: 0.1 10*3/uL (ref 0.0–0.2)
Basos: 1 %
EOS (ABSOLUTE): 0.1 10*3/uL (ref 0.0–0.4)
Eos: 1 %
Hematocrit: 41.8 % (ref 34.0–46.6)
Hemoglobin: 13.7 g/dL (ref 11.1–15.9)
Immature Grans (Abs): 0.1 10*3/uL (ref 0.0–0.1)
Immature Granulocytes: 1 %
Lymphocytes Absolute: 2.5 10*3/uL (ref 0.7–3.1)
Lymphs: 24 %
MCH: 28.7 pg (ref 26.6–33.0)
MCHC: 32.8 g/dL (ref 31.5–35.7)
MCV: 87 fL (ref 79–97)
Monocytes Absolute: 0.5 10*3/uL (ref 0.1–0.9)
Monocytes: 5 %
Neutrophils Absolute: 7 10*3/uL (ref 1.4–7.0)
Neutrophils: 68 %
Platelets: 497 10*3/uL — ABNORMAL HIGH (ref 150–450)
RBC: 4.78 x10E6/uL (ref 3.77–5.28)
RDW: 14 % (ref 11.7–15.4)
WBC: 10.3 10*3/uL (ref 3.4–10.8)

## 2020-10-28 NOTE — Progress Notes (Signed)
Subjective:  Patient ID: Connie Coffey, female    DOB: 23-Mar-1956  Age: 65 y.o. MRN: [id number]  Chief Complaint  Patient presents with   Hyperlipidemia   COPD    HPI Mixed hyperlipidemia Takes fenofibrate 160 mg daily  Acquired hypothyroidism Takes synthroid  50 mcg daily  Thrombocythemia Platelets have been elevated over the last 3 months.  And have gradually been decreasing.  She is here to have her platelets rechecked.  Patient is seeing Dr. Lavera Guise with hematology.  Her Jak 2 mutation was normal.  She has been on steroids for adrenal insufficiency for years, chronic smoker, and epidural steroid injections all of which may be contributing to her elevated platelets as well as her elevated white count she had for period of time.  Overall they seem to be improving and we will just continue to monitor  Syncope and collapse  States she fell while watering flowers and blacked out. This happened last week.  She does not feel like she was out for that [place]. Reporting difficulty sleep due to her back pain and pain in her buttocks.  Her pain is just continuing to escalate.  She fell last week.  She feels like she is having the shakes more than usual.  This had improved with the medication that I gave her however she thinks it might be because she is just not sleeping well.  Dr. Saintclair Halsted has spoken to her about back surgery but he will not do this until her bone density improves.  Per endocrinology physician assistant, Peri Jefferson, put her on Tymlos daily injection which she is scheduled to start tomorrow.  Patient seems to be crying more.  She is working on eating healthy.  Her activity has decreased due to her back hurting.  Current Outpatient Medications on File Prior to Visit  Medication Sig Dispense Refill   Abaloparatide (TYMLOS [place]) Inject 80 mcg into the skin daily.     D-Mannose 500 MG CAPS Take by mouth.     gabapentin (NEURONTIN) 600 MG tablet Take 1 tablet by mouth 4 (four) times  daily.     Insulin Pen Needle (GLOBAL EASY GLIDE PEN NEEDLES) 32G X 4 MM MISC Use one needle daily as directed     albuterol (VENTOLIN HFA) 108 (90 Base) MCG/ACT inhaler Inhale 2 puffs into the lungs every 4 (four) hours as needed.     atorvastatin (LIPITOR) 40 MG tablet TAKE 1 TABLET BY MOUTH  DAILY 90 tablet 3   bethanechol (URECHOLINE) 25 MG tablet Take 25 mg by mouth in the morning and at bedtime.     busPIRone (BUSPAR) 30 MG tablet TAKE 1 TABLET BY MOUTH  TWICE DAILY 180 tablet 1   clonazePAM (KLONOPIN) 0.5 MG tablet Take 1 tablet (0.5 mg total) by mouth 2 (two) times daily. 60 tablet 1   cyclobenzaprine (FLEXERIL) 10 MG tablet Take 10 mg by mouth 3 (three) times daily.     diclofenac sodium (VOLTAREN) 1 % GEL Apply 1 application topically daily as needed. (Patient not taking: Reported on 10/06/2020)     DULoxetine (CYMBALTA) 60 MG capsule Take 1 capsule by mouth daily.     DYMISTA 137-50 MCG/ACT SUSP One spray each nostril twice a day. 23 g 3   hydrocortisone (CORTEF) 20 MG tablet Take 1 tablet in the morning and 1/2 tablet at 4pm     levocetirizine (XYZAL) 5 MG tablet TAKE 1 TABLET BY MOUTH EVERY DAY IN THE EVENING 90 tablet 2  levothyroxine (SYNTHROID, LEVOTHROID) 50 MCG tablet Take 1 tablet by mouth every morning.     lubiprostone (AMITIZA) 8 MCG capsule TAKE 1 CAPSULE (8 MCG TOTAL) BY MOUTH 2 (TWO) TIMES DAILY WITH A MEAL. 180 capsule 1   montelukast (SINGULAIR) 10 MG tablet Take 1 tablet (10 mg total) by mouth daily. 90 tablet 3   Multiple Vitamin (MULTI-VITAMINS) TABS Take 1 tablet by mouth daily.     naloxone (NARCAN) nasal spray 4 mg/0.1 mL Place 1 spray into the nose once. (for suspected overdose) (Patient not taking: Reported on 10/06/2020)     omeprazole (PRILOSEC) 40 MG capsule TAKE 1 CAPSULE BY MOUTH  TWICE DAILY 180 capsule 3   oxyCODONE-acetaminophen (PERCOCET) 10-325 MG tablet TAKE 1 TAB EVERY 6 HOURS FOR 5 DAYS THEN 1 TAB TWICE A DAY AS NEEDED FOR SEVERE BREAKTHROUGH PAIN  0    primidone (MYSOLINE) 50 MG tablet TAKE 1 TABLET BY MOUTH TWICE A DAY 180 tablet 1   Tiotropium Bromide Monohydrate (SPIRIVA RESPIMAT) 2.5 MCG/ACT AERS Inhale 1 puff into the lungs 2 (two) times daily.     Vitamin D, Ergocalciferol, (DRISDOL) 50000 units CAPS capsule Take 1 capsule by mouth once a week.     ZTLIDO 1.8 % PTCH SMARTSIG:1 Patch(s) Topical As Needed     No current facility-administered medications on file prior to visit.   Past Medical History:  Diagnosis Date   Adrenal insufficiency (HCC)    Arthritis    Carpal tunnel syndrome    Both hands, worse in right   Chronic constipation    COPD (chronic obstructive pulmonary disease) (HCC)    CVA (cerebral vascular accident) ([place])    Facet syndrome    Fibromyalgia    GERD (gastroesophageal reflux disease)    Heart murmur    Hypercholesteremia    IBS (irritable bowel syndrome)    Ischemic stroke ([place])    07/19/2004   Lumbar radiculopathy    Osteoporosis    RLS (restless legs syndrome)    Spinal stenosis    TMJ (temporomandibular joint disorder)    Trochanteric bursitis of both hips    Vascular malformation    spinal cord   Past Surgical History:  Procedure Laterality Date   APPENDECTOMY     CARPAL TUNNEL RELEASE Right 12/13/2017   surgery   CHOLECYSTECTOMY     COLONOSCOPY  05/06/2017   Colonic polyps status post polypectomy. Interan land external hemorrhoids.    ESOPHAGOGASTRODUODENOSCOPY  04/28/2007   Mild gastrtiis. Otherwise normal EGD.    HEMORROIDECTOMY     LAMINECTOMY AND MICRODISCECTOMY LUMBAR SPINE  01/03/2005   NECK SURGERY     x2    OTHER SURGICAL HISTORY  04/24/2004   anterior cervical discectomy and fusion at C5-C6   TONSILLECTOMY     TOTAL ABDOMINAL HYSTERECTOMY      Family History  Problem Relation Age of Onset   Supraventricular tachycardia Mother    Dementia Mother    Hyperlipidemia Mother    Hypertension Mother    Osteoarthritis Mother    Osteoporosis Mother    Breast cancer Other     Breast cancer Other    Colon cancer Neg Hx    Esophageal cancer Neg Hx    Social History   Socioeconomic History   Marital status: Divorced    Spouse name: Not on file   Number of children: 1   Years of education: Not on file   Highest education level: Not on file  Occupational History  Occupation: Disabled  Tobacco Use   Smoking status: Every Day    Packs/day: 1.50    Pack years: 0.00    Types: Cigarettes   Smokeless tobacco: Never  Vaping Use   Vaping Use: Never used  Substance and Sexual Activity   Alcohol use: Not Currently    Comment: quit 19 years   Drug use: Never   Sexual activity: Not on file  Other Topics Concern   Not on file  Social History Narrative   Not on file   Social Determinants of Health   Financial Resource Strain: Not on file  Food Insecurity: Not on file  Transportation Needs: No Transportation Needs   Lack of Transportation (Medical): No   Lack of Transportation (Non-Medical): No  Physical Activity: Not on file  Stress: Not on file  Social Connections: Not on file    Review of Systems  Constitutional:  Positive for fatigue. Negative for chills and fever.  HENT:  Positive for congestion. Negative for ear pain, rhinorrhea and sore throat.   Respiratory:  Positive for cough. Negative for shortness of breath.   Cardiovascular:  Negative for chest pain.  Gastrointestinal:  Positive for constipation. Negative for abdominal pain, diarrhea, nausea and vomiting.  Endocrine: Positive for polydipsia and polyphagia.  Genitourinary:  Negative for dysuria and urgency.  Musculoskeletal:  Positive for arthralgias, back pain and myalgias.  Neurological:  Positive for weakness. Negative for dizziness, light-headedness and headaches.  Psychiatric/Behavioral:  Negative for dysphoric mood. The patient is not nervous/anxious.     Objective:  BP 108/78   Pulse 100   Temp (!) 97.4 F (36.3 C)   Resp 18   Ht 5\' 1"  (1.549 m)   Wt 141 lb 3.2 oz (64 kg)    BMI 26.68 kg/m   BP/Weight 10/28/2020 10/21/2020 6/60/6301  Systolic BP 601 093 235  Diastolic BP 78 67 66  Wt. (Lbs) 141.2 142.3 139  BMI 26.68 26.89 26.26    Physical Exam Vitals reviewed.  Constitutional:      General: She is not in acute distress.    Appearance: Normal appearance. She is obese.  HENT:     Nose: No congestion or rhinorrhea.  Neck:     Thyroid: No thyroid mass.     Vascular: No carotid bruit.  Cardiovascular:     Rate and Rhythm: Normal rate and regular rhythm.     Pulses: Normal pulses.     Heart sounds: Normal heart sounds. No murmur heard. Pulmonary:     Effort: Pulmonary effort is normal.     Breath sounds: Normal breath sounds.  Abdominal:     General: Bowel sounds are normal.     Palpations: Abdomen is soft. There is no mass.     Tenderness: There is no abdominal tenderness.  Skin:    General: Skin is warm and dry.  Neurological:     Mental Status: She is alert and oriented to person, place, and time.     Cranial Nerves: No cranial nerve deficit.     Coordination: Coordination normal.     Gait: Gait normal.  Psychiatric:        Mood and Affect: Mood normal.        Behavior: Behavior normal.    Diabetic Foot Exam - Simple   No data filed      Lab Results  Component Value Date   WBC 10.3 10/28/2020   HGB 13.7 10/28/2020   HCT 41.8 10/28/2020   PLT 497 (H)  10/28/2020   GLUCOSE 99 08/31/2020   CHOL 183 07/26/2020   TRIG 102 07/26/2020   HDL 82 07/26/2020   LDLCALC 83 07/26/2020   ALT 22 08/31/2020   AST 19 08/31/2020   NA 141 08/31/2020   K 3.8 08/31/2020   CL 93 (L) 08/31/2020   CREATININE 0.62 08/31/2020   BUN 12 08/31/2020   CO2 28 08/31/2020   TSH 0.335 (L) 07/26/2020      Assessment & Plan:   1. Syncope and collapse - Ambulatory referral to Cardiology - EKG completed. I am concerned needs a 14 day holter monitor.   2. Mixed hyperlipidemia Well controlled. The current medical regimen is effective;  continue  present plan and medications.  3. Acquired hypothyroidism The current medical regimen is effective;  continue present plan and medications.  4. Thrombocythemia Improving. Continue to monitor. Multifactorial. Recommend quit smoking.  - CBC with Differential/Platelet  5. Mild recurrent major depression (HCC) The current medical regimen is effective;  continue present plan and medications.  6. Chronic pain syndrome Management per specialist. IPS.  7. Sciatica of right side associated with disorder of lumbar spine MGMT per Dr. Saintclair Halsted and IPS.  Needs surgery, but must work on strengthening bones.   8. Uncomplicated opioid dependence ([place]) Mgmt by IPS.   9. Osteoporosis Ordered Tymlos daily by endocrinology.   Orders Placed This Encounter  Procedures   CBC with Differential/Platelet   Ambulatory referral to Cardiology     Follow-up: Return in about 3 months (around 01/28/2021) for fasting.  An After Visit Summary was printed and given to the patient.  [name], MD [facility] [phone]

## 2020-10-31 ENCOUNTER — Other Ambulatory Visit: Payer: Self-pay | Admitting: *Deleted

## 2020-10-31 ENCOUNTER — Other Ambulatory Visit: Payer: Self-pay | Admitting: Physician Assistant

## 2020-10-31 DIAGNOSIS — R55 Syncope and collapse: Secondary | ICD-10-CM

## 2020-11-01 ENCOUNTER — Telehealth: Payer: Self-pay

## 2020-11-01 NOTE — Progress Notes (Signed)
Chronic Care Management Pharmacy Assistant   Name: Connie Coffey  MRN: [id number] DOB: 08-Jun-1955   Reason for Encounter: Disease State for hypertension   Recent office visits:  11/03/20-[facility], Thoracic degenerative disc disease, Status post cervical spinal fusion, Lumbar post-laminectomy syndrome  11/02/20-[place] term monitor placed, wears till 11/15/20  10/31/20-cardiology ordered [place] term monitor for syncope  10/28/20-Dr Cox PCP, HDL, labs, patient not taking Ondansetron or eye drops,   Blood count abnormal. Platelets improved, but still little high.   10/26/20-endocrinology, Adrenal insufficiency: Checking a BMP.  Continue current hydrocortisone doses.  Osteoporosis: We discussed trying Tymlos or Foreto in place of alendronate.  We will first have to make sure her PTH levels are still normal.  Reviewed that these are a daily injection that she would take for 2 years and then we would put her back on bisphosphonate therapy or Prolia.  Stop alendronate 70mg  once a week.  Sample of Tymlos 45mcg daily provided today, this will last for 1 month.  Plan to repeat DEXA in November 2023.  Hypothyroidism: Checking thyroid levels today.  Will adjust dose of T4 as needed.    Recent consult visits:  none  Hospital visits:  None in previous 6 months  Medications: Outpatient Encounter Medications as of 11/01/2020  Medication Sig   Abaloparatide (TYMLOS [place]) Inject 80 mcg into the skin daily.   albuterol (VENTOLIN HFA) 108 (90 Base) MCG/ACT inhaler Inhale 2 puffs into the lungs every 4 (four) hours as needed.   atorvastatin (LIPITOR) 40 MG tablet TAKE 1 TABLET BY MOUTH  DAILY   bethanechol (URECHOLINE) 25 MG tablet Take 25 mg by mouth in the morning and at bedtime.   busPIRone (BUSPAR) 30 MG tablet TAKE 1 TABLET BY MOUTH  TWICE DAILY   clonazePAM (KLONOPIN) 0.5 MG tablet Take 1 tablet (0.5 mg total) by mouth 2 (two) times daily.   cyclobenzaprine (FLEXERIL) 10 MG tablet Take 10 mg  by mouth 3 (three) times daily.   D-Mannose 500 MG CAPS Take 1 capsule by mouth in the morning and at bedtime.   diclofenac sodium (VOLTAREN) 1 % GEL Apply 1 application topically daily as needed. (Patient not taking: Reported on 10/06/2020)   DULoxetine (CYMBALTA) 60 MG capsule Take 1 capsule by mouth daily.   DYMISTA 137-50 MCG/ACT SUSP One spray each nostril twice a day.   fenofibrate 160 MG tablet TAKE 1 TABLET BY MOUTH EVERY DAY   gabapentin (NEURONTIN) 600 MG tablet Take 1 tablet by mouth 4 (four) times daily.   hydrocortisone (CORTEF) 20 MG tablet Take 1 tablet in the morning and 1/2 tablet at 4pm   levocetirizine (XYZAL) 5 MG tablet TAKE 1 TABLET BY MOUTH EVERY DAY IN THE EVENING   levothyroxine (SYNTHROID, LEVOTHROID) 50 MCG tablet Take 1 tablet by mouth every morning.   lubiprostone (AMITIZA) 8 MCG capsule TAKE 1 CAPSULE (8 MCG TOTAL) BY MOUTH 2 (TWO) TIMES DAILY WITH A MEAL.   montelukast (SINGULAIR) 10 MG tablet Take 1 tablet (10 mg total) by mouth daily.   Multiple Vitamin (MULTI-VITAMINS) TABS Take 1 tablet by mouth daily.   naloxone (NARCAN) nasal spray 4 mg/0.1 mL Place 1 spray into the nose once. (for suspected overdose) (Patient not taking: Reported on 10/06/2020)   omeprazole (PRILOSEC) 40 MG capsule TAKE 1 CAPSULE BY MOUTH  TWICE DAILY   oxyCODONE-acetaminophen (PERCOCET) 10-325 MG tablet TAKE 1 TAB EVERY 6 HOURS FOR 5 DAYS THEN 1 TAB TWICE A DAY AS NEEDED FOR SEVERE  BREAKTHROUGH PAIN   primidone (MYSOLINE) 50 MG tablet TAKE 1 TABLET BY MOUTH TWICE A DAY   Tiotropium Bromide Monohydrate (SPIRIVA RESPIMAT) 2.5 MCG/ACT AERS Inhale 1 puff into the lungs 2 (two) times daily.   Vitamin D, Ergocalciferol, (DRISDOL) 50000 units CAPS capsule Take 1 capsule by mouth once a week.   ZTLIDO 1.8 % PTCH SMARTSIG:1 Patch(s) Topical As Needed   No facility-administered encounter medications on file as of 11/01/2020.     Recent Office Vitals: BP Readings from Last 3 Encounters:  10/28/20  108/78  10/21/20 139/67  09/22/20 126/66   Pulse Readings from Last 3 Encounters:  10/28/20 100  10/21/20 99  09/22/20 92    Wt Readings from Last 3 Encounters:  10/28/20 141 lb 3.2 oz (64 kg)  10/21/20 142 lb 4.8 oz (64.5 kg)  09/22/20 139 lb (63 kg)     Kidney Function Lab Results  Component Value Date/Time   CREATININE 0.62 08/31/2020 03:13 PM   CREATININE 0.80 07/26/2020 11:17 AM   GFRNONAA 79 04/26/2020 11:06 AM   GFRAA 91 04/26/2020 11:06 AM    BMP Latest Ref Rng & Units 08/31/2020 07/26/2020 04/26/2020  Glucose 65 - 99 mg/dL 99 103(H) 91  BUN 8 - 27 mg/dL 12 12 12   Creatinine 0.57 - 1.00 mg/dL 0.62 0.80 0.79  BUN/Creat Ratio 12 - 28 19 15 15   Sodium 134 - 144 mmol/L 141 144 143  Potassium 3.5 - 5.2 mmol/L 3.8 4.2 4.4  Chloride 96 - 106 mmol/L 93(L) 104 102  CO2 20 - 29 mmol/L 28 24 28   Calcium 8.7 - 10.3 mg/dL 9.3 9.6 9.9     Current antihypertensive regimen:  Not on any current hypertension medication     How often are you checking your Blood Pressure? infrequently Wrist or arm cuff: Wrist  Caffeine intake:  Just once in a while  Salt intake: Uses no added salt  OTC medications including pseudoephedrine or NSAIDs? Oxycodone   Any readings above 180/120? No  What recent interventions/DTPs have been made by any provider to improve Blood Pressure control since last CPP Visit: Patient had a fall about 2 weeks ago, she has aggravated her back and leg, She has a heat monitor on now,l she stated the TENS unit she ordered was out of stock, she has been in bed for 3 days with little pain relief.  She is taking shots daily to improve her bone density so that she can have surgery.  She asked to have her medications sent to [facility] and she also asked for refill of Spiriva to be sent, she has one left   Any recent hospitalizations or ED visits since last visit with CPP? No  What diet changes have been made to improve Blood Pressure Control?  She uses no added salt  What  exercise is being done to improve your Blood Pressure Control?  Patient is not active due back/leg pain, trying to get bone density up to have surgery  Adherence Review: Is the patient currently on ACE/ARB medication? No Does the patient have >5 day gap between last estimated fill dates? CPP to review   Star Rating Drugs:  Medication:  Last Fill: Day Supply Atorvastatin  09/29/20  90 Fenofibrate  10/31/20  90 Levothyroxine  09/29/20  [place], [place] Pharmacist Assistant [phone]

## 2020-11-02 ENCOUNTER — Ambulatory Visit (INDEPENDENT_AMBULATORY_CARE_PROVIDER_SITE_OTHER): Payer: 59

## 2020-11-02 ENCOUNTER — Other Ambulatory Visit: Payer: Self-pay

## 2020-11-02 DIAGNOSIS — R55 Syncope and collapse: Secondary | ICD-10-CM

## 2020-11-06 DIAGNOSIS — M25559 Pain in unspecified hip: Secondary | ICD-10-CM | POA: Insufficient documentation

## 2020-11-06 NOTE — Addendum Note (Signed)
Addended byRochel Brome on: 11/06/2020 01:50 PM   Modules accepted: Orders

## 2020-11-14 ENCOUNTER — Other Ambulatory Visit: Payer: Self-pay

## 2020-11-14 MED ORDER — MONTELUKAST SODIUM 10 MG PO TABS: 10.0000 mg | ORAL_TABLET | Freq: Every day | ORAL | 3 refills | Status: DC

## 2020-11-14 MED ORDER — GABAPENTIN 600 MG PO TABS: 600.0000 mg | ORAL_TABLET | Freq: Four times a day (QID) | ORAL | 1 refills | Status: DC

## 2020-11-14 MED ORDER — HYDROCORTISONE 20 MG PO TABS: 30.0000 mg | ORAL_TABLET | Freq: Every day | ORAL | 1 refills | Status: DC

## 2020-11-14 MED ORDER — LUBIPROSTONE 8 MCG PO CAPS: 8.0000 ug | ORAL_CAPSULE | Freq: Two times a day (BID) | ORAL | 1 refills | Status: DC

## 2020-11-14 MED ORDER — LEVOCETIRIZINE DIHYDROCHLORIDE 5 MG PO TABS: ORAL_TABLET | ORAL | 1 refills | Status: DC

## 2020-11-14 MED ORDER — PRIMIDONE 50 MG PO TABS: 50.0000 mg | ORAL_TABLET | Freq: Two times a day (BID) | ORAL | 1 refills | Status: DC

## 2020-11-14 MED ORDER — DULOXETINE HCL 60 MG PO CPEP: 60.0000 mg | ORAL_CAPSULE | Freq: Every day | ORAL | 1 refills | Status: DC

## 2020-11-14 MED ORDER — CYCLOBENZAPRINE HCL 10 MG PO TABS: 10.0000 mg | ORAL_TABLET | Freq: Three times a day (TID) | ORAL | 1 refills | Status: DC

## 2020-11-14 MED ORDER — TYMLOS 3120 MCG/1.56ML ~~LOC~~ SOPN: 80.0000 ug | PEN_INJECTOR | Freq: Every day | SUBCUTANEOUS | 1 refills | Status: DC

## 2020-11-14 MED ORDER — OMEPRAZOLE 40 MG PO CPDR: 1.0000 | DELAYED_RELEASE_CAPSULE | Freq: Two times a day (BID) | ORAL | 3 refills | Status: DC

## 2020-11-14 MED ORDER — SPIRIVA RESPIMAT 2.5 MCG/ACT IN AERS: 1.0000 | INHALATION_SPRAY | Freq: Two times a day (BID) | RESPIRATORY_TRACT | 1 refills | Status: DC

## 2020-11-14 MED ORDER — ALBUTEROL SULFATE HFA 108 (90 BASE) MCG/ACT IN AERS: 2.0000 | INHALATION_SPRAY | RESPIRATORY_TRACT | 2 refills | Status: DC | PRN

## 2020-11-14 MED ORDER — FENOFIBRATE 160 MG PO TABS: 160.0000 mg | ORAL_TABLET | Freq: Every day | ORAL | 1 refills | Status: DC

## 2020-11-14 MED ORDER — LEVOTHYROXINE SODIUM 50 MCG PO TABS: 50.0000 ug | ORAL_TABLET | ORAL | 0 refills | Status: DC

## 2020-11-14 MED ORDER — BETHANECHOL CHLORIDE 25 MG PO TABS: 25.0000 mg | ORAL_TABLET | Freq: Two times a day (BID) | ORAL | 1 refills | Status: DC

## 2020-11-14 MED ORDER — DYMISTA 137-50 MCG/ACT NA SUSP: NASAL | 3 refills | Status: DC

## 2020-11-14 MED ORDER — BUSPIRONE HCL 30 MG PO TABS: 30.0000 mg | ORAL_TABLET | Freq: Two times a day (BID) | ORAL | 1 refills | Status: DC

## 2020-11-24 ENCOUNTER — Other Ambulatory Visit: Payer: Self-pay | Admitting: Family Medicine

## 2020-11-24 ENCOUNTER — Encounter: Payer: Self-pay | Admitting: Nurse Practitioner

## 2020-11-24 ENCOUNTER — Other Ambulatory Visit: Payer: Self-pay

## 2020-11-24 ENCOUNTER — Ambulatory Visit (INDEPENDENT_AMBULATORY_CARE_PROVIDER_SITE_OTHER): Payer: 59 | Admitting: Nurse Practitioner

## 2020-11-24 VITALS — BP 148/74 | HR 102 | Temp 97.8°F | Ht 61.0 in | Wt 144.8 lb

## 2020-11-24 DIAGNOSIS — M79642 Pain in left hand: Secondary | ICD-10-CM | POA: Diagnosis not present

## 2020-11-24 MED ORDER — DICLOFENAC SODIUM 1 % EX GEL: 20.0000 g | Freq: Four times a day (QID) | CUTANEOUS | 0 refills | Status: DC

## 2020-11-24 NOTE — Progress Notes (Signed)
Acute Office Visit  Subjective:    Patient ID: Connie Coffey, female    DOB: [birth year], 65 y.o.   MRN: [id number]  Chief Complaint  Patient presents with   Left hand pain    HPI Patient is in today for left hand pain with swelling. Onset was 4-days ago. Treatment has been bag of frozen peas. She is under pain contract with Oxycodone and Gabapentin prescribed. She is on a new osteoporosis daily injection medication, Tymlos. She began medication 3-weeks ago.  Past Medical History:  Diagnosis Date   Adrenal insufficiency (HCC)    Arthritis    Carpal tunnel syndrome    Both hands, worse in right   Chronic constipation    COPD (chronic obstructive pulmonary disease) (HCC)    CVA (cerebral vascular accident) ([place])    Facet syndrome    Fibromyalgia    GERD (gastroesophageal reflux disease)    Heart murmur    Hypercholesteremia    IBS (irritable bowel syndrome)    Ischemic stroke ([place])    07/19/2004   Lumbar radiculopathy    Osteoporosis    RLS (restless legs syndrome)    Spinal stenosis    TMJ (temporomandibular joint disorder)    Trochanteric bursitis of both hips    Vascular malformation    spinal cord    Past Surgical History:  Procedure Laterality Date   APPENDECTOMY     CARPAL TUNNEL RELEASE Right 12/13/2017   surgery   CHOLECYSTECTOMY     COLONOSCOPY  05/06/2017   Colonic polyps status post polypectomy. Interan land external hemorrhoids.    ESOPHAGOGASTRODUODENOSCOPY  04/28/2007   Mild gastrtiis. Otherwise normal EGD.    HEMORROIDECTOMY     LAMINECTOMY AND MICRODISCECTOMY LUMBAR SPINE  01/03/2005   NECK SURGERY     x2    OTHER SURGICAL HISTORY  04/24/2004   anterior cervical discectomy and fusion at C5-C6   TONSILLECTOMY     TOTAL ABDOMINAL HYSTERECTOMY      Family History  Problem Relation Age of Onset   Supraventricular tachycardia Mother    Dementia Mother    Hyperlipidemia Mother    Hypertension Mother    Osteoarthritis Mother    Osteoporosis  Mother    Breast cancer Other    Breast cancer Other    Colon cancer Neg Hx    Esophageal cancer Neg Hx     Social History   Socioeconomic History   Marital status: Divorced    Spouse name: Not on file   Number of children: 1   Years of education: Not on file   Highest education level: Not on file  Occupational History   Occupation: Disabled  Tobacco Use   Smoking status: Every Day    Packs/day: 1.50    Pack years: 0.00    Types: Cigarettes   Smokeless tobacco: Never  Vaping Use   Vaping Use: Never used  Substance and Sexual Activity   Alcohol use: Not Currently    Comment: quit 19 years   Drug use: Never   Sexual activity: Not on file  Other Topics Concern   Not on file  Social History Narrative   Not on file   Social Determinants of Health   Financial Resource Strain: Not on file  Food Insecurity: Not on file  Transportation Needs: No Transportation Needs   Lack of Transportation (Medical): No   Lack of Transportation (Non-Medical): No  Physical Activity: Not on file  Stress: Not on file  Social Connections: Not  on file  Intimate Partner Violence: Not on file    Outpatient Medications Prior to Visit  Medication Sig Dispense Refill   Abaloparatide (TYMLOS) 3120 MCG/1.56ML SOPN Inject 80 mcg into the skin daily. 4.68 mL 1   albuterol (VENTOLIN HFA) 108 (90 Base) MCG/ACT inhaler Inhale 2 puffs into the lungs every 4 (four) hours as needed. 18 g 2   atorvastatin (LIPITOR) 40 MG tablet TAKE 1 TABLET BY MOUTH  DAILY 90 tablet 3   bethanechol (URECHOLINE) 25 MG tablet Take 1 tablet (25 mg total) by mouth in the morning and at bedtime. 180 tablet 1   busPIRone (BUSPAR) 30 MG tablet Take 1 tablet (30 mg total) by mouth 2 (two) times daily. 180 tablet 1   clonazePAM (KLONOPIN) 0.5 MG tablet Take 1 tablet (0.5 mg total) by mouth 2 (two) times daily. 60 tablet 1   cyclobenzaprine (FLEXERIL) 10 MG tablet Take 1 tablet (10 mg total) by mouth 3 (three) times daily. 270  tablet 1   D-Mannose 500 MG CAPS Take by mouth.     diclofenac sodium (VOLTAREN) 1 % GEL Apply 1 application topically daily as needed.     DULoxetine (CYMBALTA) 60 MG capsule Take 1 capsule (60 mg total) by mouth daily. 180 capsule 1   DYMISTA 137-50 MCG/ACT SUSP One spray each nostril twice a day. 23 g 3   fenofibrate 160 MG tablet Take 1 tablet (160 mg total) by mouth daily. 90 tablet 1   gabapentin (NEURONTIN) 600 MG tablet Take 1 tablet (600 mg total) by mouth 4 (four) times daily. 360 tablet 1   hydrocortisone (CORTEF) 20 MG tablet Take 1.5 tablets (30 mg total) by mouth daily. Take 1 tablet in the morning and 1/2 tablet at 4pm 135 tablet 1   Insulin Pen Needle (GLOBAL EASY GLIDE PEN NEEDLES) 32G X 4 MM MISC Use one needle daily as directed     levocetirizine (XYZAL) 5 MG tablet TAKE 1 TABLET BY MOUTH EVERY DAY IN THE EVENING 90 tablet 1   levothyroxine (SYNTHROID) 50 MCG tablet Take 1 tablet (50 mcg total) by mouth every morning. 90 tablet 0   lubiprostone (AMITIZA) 8 MCG capsule Take 1 capsule (8 mcg total) by mouth 2 (two) times daily with a meal. 180 capsule 1   montelukast (SINGULAIR) 10 MG tablet Take 1 tablet (10 mg total) by mouth daily. 90 tablet 3   Multiple Vitamin (MULTI-VITAMINS) TABS Take 1 tablet by mouth daily.     naloxone (NARCAN) nasal spray 4 mg/0.1 mL Place 1 spray into the nose once. (for suspected overdose)     omeprazole (PRILOSEC) 40 MG capsule Take 1 capsule (40 mg total) by mouth 2 (two) times daily. 180 capsule 3   oxyCODONE-acetaminophen (PERCOCET) 10-325 MG tablet TAKE 1 TAB EVERY 4 HOURS FOR 5 DAYS THEN 1 TAB TWICE A DAY AS NEEDED FOR SEVERE BREAKTHROUGH PAIN  0   primidone (MYSOLINE) 50 MG tablet Take 1 tablet (50 mg total) by mouth 2 (two) times daily. 180 tablet 1   Tiotropium Bromide Monohydrate (SPIRIVA RESPIMAT) 2.5 MCG/ACT AERS Inhale 1 puff into the lungs 2 (two) times daily. 12 g 1   Vitamin D, Ergocalciferol, (DRISDOL) 50000 units CAPS capsule Take 1  capsule by mouth once a week.     ZTLIDO 1.8 % [facility] SMARTSIG:1 Patch(s) Topical As Needed     No facility-administered medications prior to visit.    Allergies  Allergen Reactions   Beta Vulgaris  Celebrex [Celecoxib]     "because of stomach ulcers"   Chantix [Varenicline]    Dog Epithelium Allergy Skin Test    Fish Oil Other (See Comments)    hemorrhoids   Gramineae Pollens    Molds & Smuts    Nsaids Other (See Comments)    GI Upset. Ulcers   Other     beets   Nicotrol [Nicotine] Nausea Only   Penicillins Rash    Review of Systems  Constitutional:  Negative for appetite change, fatigue and fever.  HENT:  Negative for congestion, ear pain, sinus pressure and sore throat.   Eyes:  Negative for pain.  Respiratory:  Negative for cough, chest tightness, shortness of breath and wheezing.   Cardiovascular:  Negative for chest pain and palpitations.  Gastrointestinal:  Negative for abdominal pain, constipation, diarrhea, nausea and vomiting.  Endocrine: Negative.   Genitourinary:  Negative for dysuria and hematuria.  Musculoskeletal:  Positive for arthralgias and back pain. Negative for joint swelling and myalgias.       Left hand pain  Skin:  Negative for rash.  Neurological:  Negative for dizziness, weakness and headaches.  Psychiatric/Behavioral:  Negative for dysphoric mood. The patient is not nervous/anxious.       Objective:    Physical Exam Vitals reviewed.  Constitutional:      Appearance: Normal appearance.  Cardiovascular:     Rate and Rhythm: Normal rate and regular rhythm.     Pulses: Normal pulses.     Heart sounds: Normal heart sounds.  Pulmonary:     Effort: Pulmonary effort is normal.     Breath sounds: Normal breath sounds.  Musculoskeletal:        General: Swelling and tenderness present.     Comments: Left hand carpometacarpal edema between index and middle finger; hand warm to touch, nail beds pink with brisk capillary refill, radial pulses  strong and palpable.  Skin:    General: Skin is warm and dry.     Capillary Refill: Capillary refill takes less than 2 seconds.  Neurological:     General: No focal deficit present.     Mental Status: She is alert and oriented to person, place, and time.  Psychiatric:        Mood and Affect: Mood normal.        Behavior: Behavior normal.        Thought Content: Thought content normal.        Judgment: Judgment normal.    BP (!) 148/74 (BP Location: Left Arm, Patient Position: Sitting)   Pulse (!) 102   Temp 97.8 F (36.6 C) (Temporal)   Ht '5\' 1"'  (1.549 m)   Wt 144 lb 12.8 oz (65.7 kg)   SpO2 94%   BMI 27.36 kg/m  Wt Readings from Last 3 Encounters:  11/24/20 144 lb 12.8 oz (65.7 kg)  10/28/20 141 lb 3.2 oz (64 kg)  10/21/20 142 lb 4.8 oz (64.5 kg)    Health Maintenance Due  Topic Date Due   Hepatitis C Screening  Never done   Zoster Vaccines- Shingrix (1 of 2) Never done   PNA vac Low Risk Adult (2 of 2 - PPSV23) 05/05/2019   COVID-19 Vaccine (3 - Pfizer risk series) 05/26/2020    There are no preventive care reminders to display for this patient.   Lab Results  Component Value Date   TSH 0.335 (L) 07/26/2020   Lab Results  Component Value Date   WBC 10.3 10/28/2020  HGB 13.7 10/28/2020   HCT 41.8 10/28/2020   MCV 87 10/28/2020   PLT 497 (H) 10/28/2020   Lab Results  Component Value Date   NA 141 08/31/2020   K 3.8 08/31/2020   CO2 28 08/31/2020   GLUCOSE 99 08/31/2020   BUN 12 08/31/2020   CREATININE 0.62 08/31/2020   BILITOT 0.2 08/31/2020   ALKPHOS 52 08/31/2020   AST 19 08/31/2020   ALT 22 08/31/2020   PROT 6.0 08/31/2020   ALBUMIN 3.9 08/31/2020   CALCIUM 9.3 08/31/2020   EGFR 98 08/31/2020   Lab Results  Component Value Date   CHOL 183 07/26/2020   Lab Results  Component Value Date   HDL 82 07/26/2020   Lab Results  Component Value Date   LDLCALC 83 07/26/2020   Lab Results  Component Value Date   TRIG 102 07/26/2020   Lab  Results  Component Value Date   CHOLHDL 2.2 07/26/2020         Assessment & Plan:   1. Left hand pain - diclofenac Sodium (VOLTAREN) 1 % GEL; Apply 20 g topically 4 (four) times daily.  Dispense: 20 g; Refill: 0   -rest, ice, and elevate left hand -we will call you with x-ray results  Rest, ice, and elevate left hand Apply Voltaren gel to left hand 4 times daily as needed We will call you with x-ray results Follow-up as needed   I, Lauren Peterson [place] as a scribe for Rip Harbour, NP.,have documented all relevant documentation on the behalf of Rip Harbour, NP,as directed by  Rip Harbour, NP while in the presence of Rip Harbour, NP.   I, Rip Harbour, NP, have reviewed all documentation for this visit. The documentation on 11/24/20 for the exam, diagnosis, procedures, and orders are all accurate and complete.    Follow-up: PRN, pending x-ray results  Signed, [name], DNP

## 2020-11-24 NOTE — Patient Instructions (Signed)
Rest, ice, and elevate left hand Apply Voltaren gel to left hand 4 times daily as needed We will call you with x-ray results Follow-up as needed  RICE Therapy for Routine Care of Injuries Many injuries can be cared for with rest, ice, compression, and elevation (RICE therapy). This includes: Resting the injured body part. Putting ice on the injury. Putting pressure (compression) on the injury. Raising the injured part (elevation). Using RICE therapy can help to lessen pain and swelling. Supplies needed: Ice. Plastic bag. Towel. Elastic bandage. Pillow or pillows to raise your injured body part. How to care for your injury with RICE therapy Rest Try to rest the injured part of your body. You can go back to your normal activities when your doctor says it is okay to do them and when you can do themwithout pain. If you rest the injury too much, it may not heal as well. Some injuries heal better with early movement instead of resting for too [place]. Ask your doctor ifyou should do exercises to help your injury get better. Ice  If told, put ice on the injured area. To do this: Put ice in a plastic bag. Place a towel between your skin and the bag. Leave the ice on for 20 minutes, 2-3 times a day. Take off the ice if your skin turns bright red. This is very important. If you cannot feel pain, heat, or cold, you have a greater risk of damage to the area. Do not put ice on your bare skin. Use ice for as many days as your doctor tells you to use it.  Compression Put pressure on the injured area. This can be done with an elastic bandage. If this type of bandage has been put on your injury: Follow instructions on the package the bandage came in about how to use it. Do not wrap the bandage too tightly. Wrap the bandage more loosely if part of your body beyond the bandage is blue, swollen, cold, painful, or loses feeling. Take off the bandage and put it on again every 3-4 hours or as told by your  doctor. See your doctor if the bandage seems to make your problems worse.  Elevation Raise the injured area above the level of your heart while you are sitting orlying down. Follow these instructions at home: If your symptoms get worse or last a Harbor time, make a follow-up appointment with your doctor. You may need to have imaging tests, such as X-rays or an MRI. If you have imaging tests, ask how to get your results when they are ready. Return to your normal activities when your doctor says that it is safe. Keep all follow-up visits. Contact a doctor if: You keep having pain and swelling. Your symptoms get worse. Get help right away if: You have sudden, very bad pain at your injury or lower than your injury. You have redness or more swelling around your injury. You have tingling or numbness at your injury or lower than your injury, and it does not go away when you take off the bandage. Summary Many injuries can be cared for using rest, ice, compression, and elevation (RICE therapy). You can go back to your normal activities when your doctor says it is okay and when you can do them without pain. Put ice on the injured area as told by your doctor. Get help if your symptoms get worse or if you keep having pain and swelling. This information is not intended to replace advice given  to you by your health care provider. Make sure you discuss any questions you have with your healthcare provider. Document Revised: 03/03/2020 Document Reviewed: 03/03/2020 Elsevier Patient Education  [place] Pain Many things can cause hand pain. Some common causes are: An injury. Repeating the same movement with your hand over and over (overuse). Osteoporosis. Arthritis. Lumps in the tendons or joints of the hand and wrist (ganglion cysts). Nerve compression syndromes (carpal tunnel syndrome). Inflammation of the tendons (tendinitis). Infection. Follow these instructions at home: Pay  attention to any changes in your symptoms. Take these actions to help withyour discomfort: Managing pain, stiffness, and swelling  Take over-the-counter and prescription medicines only as told by your health care provider. Wear a hand splint or support as told by your health care provider. If directed, put ice on the affected area: Put ice in a plastic bag. Place a towel between your skin and the bag. Leave the ice on for 20 minutes, 2-3 times a day.  Activity Take breaks from repetitive activity often. Avoid activities that make your pain worse. Minimize stress on your hands and wrists as much as possible. Do stretches or exercises as told by your health care provider. Do not do activities that make your pain worse. Contact a health care provider if: Your pain does not get better after a few days of self-care. Your pain gets worse. Your pain affects your ability to do your daily activities. Get help right away if: Your hand becomes warm, red, or swollen. Your hand is numb or tingling. Your hand is extremely swollen or deformed. Your hand or fingers turn white or blue. You cannot move your hand, wrist, or fingers. Summary Many things can cause hand pain. Contact your health care provider if your pain does not get better after a few days of self care. Minimize stress on your hands and wrists as much as possible. Do not do activities that make your pain worse. This information is not intended to replace advice given to you by your health care provider. Make sure you discuss any questions you have with your healthcare provider. Document Revised: 02/07/2018 Document Reviewed: 02/07/2018 Elsevier Patient Education  [place].

## 2020-11-29 ENCOUNTER — Other Ambulatory Visit: Payer: Self-pay | Admitting: Family Medicine

## 2020-11-30 ENCOUNTER — Telehealth: Payer: Self-pay

## 2020-11-30 NOTE — Progress Notes (Signed)
Chronic Care Management Pharmacy Assistant   Name: Connie Coffey  MRN: [id number] DOB: [birth year]   Reason for Encounter: Disease State for Lipids  Recent office visits:  12/02/20-Dr Cox PCP  Lab results: Wbc elevated. This has gone up and down.  Platelets improved, not quite normal.  11/24/20-Connie Heaton NP (PCP), left hand pain, started Voltaren gel. Xrays ordered  Recent consult visits:  11/03/20-Connie Coffey neurosurgery, Thoracic degenerative disc disease, Status post cervical spinal fusion, Lumbar post-laminectomy syndrome  11/02/20-[facility], Connie Coffey term monitor placed   Hospital visits: None in previous 6 months   Medications: Outpatient Encounter Medications as of 11/30/2020  Medication Sig   Abaloparatide (TYMLOS) 3120 MCG/1.56ML SOPN Inject 80 mcg into the skin daily.   albuterol (VENTOLIN HFA) 108 (90 Base) MCG/ACT inhaler Inhale 2 puffs into the lungs every 4 (four) hours as needed.   atorvastatin (LIPITOR) 40 MG tablet TAKE 1 TABLET BY MOUTH  DAILY   bethanechol (URECHOLINE) 25 MG tablet Take 1 tablet (25 mg total) by mouth in the morning and at bedtime.   busPIRone (BUSPAR) 30 MG tablet Take 1 tablet (30 mg total) by mouth 2 (two) times daily.   clonazePAM (KLONOPIN) 0.5 MG tablet TAKE 1 TABLET BY MOUTH 2 TIMES DAILY.   cyclobenzaprine (FLEXERIL) 10 MG tablet Take 1 tablet (10 mg total) by mouth 3 (three) times daily.   D-Mannose 500 MG CAPS Take by mouth.   diclofenac sodium (VOLTAREN) 1 % GEL Apply 1 application topically daily as needed.   diclofenac Sodium (VOLTAREN) 1 % GEL Apply 20 g topically 4 (four) times daily.   DULoxetine (CYMBALTA) 60 MG capsule Take 1 capsule (60 mg total) by mouth daily.   DYMISTA 137-50 MCG/ACT SUSP One spray each nostril twice a day.   fenofibrate 160 MG tablet Take 1 tablet (160 mg total) by mouth daily.   gabapentin (NEURONTIN) 600 MG tablet Take 1 tablet (600 mg total) by mouth 4 (four) times daily.   hydrocortisone  (CORTEF) 20 MG tablet Take 1.5 tablets (30 mg total) by mouth daily. Take 1 tablet in the morning and 1/2 tablet at 4pm   Insulin Pen Needle (GLOBAL EASY GLIDE PEN NEEDLES) 32G X 4 MM MISC Use one needle daily as directed   levocetirizine (XYZAL) 5 MG tablet TAKE 1 TABLET BY MOUTH EVERY DAY IN THE EVENING   levothyroxine (SYNTHROID) 50 MCG tablet Take 1 tablet (50 mcg total) by mouth every morning.   lubiprostone (AMITIZA) 8 MCG capsule Take 1 capsule (8 mcg total) by mouth 2 (two) times daily with a meal.   montelukast (SINGULAIR) 10 MG tablet Take 1 tablet (10 mg total) by mouth daily.   Multiple Vitamin (MULTI-VITAMINS) TABS Take 1 tablet by mouth daily.   naloxone (NARCAN) nasal spray 4 mg/0.1 mL Place 1 spray into the nose once. (for suspected overdose)   omeprazole (PRILOSEC) 40 MG capsule Take 1 capsule (40 mg total) by mouth 2 (two) times daily.   oxyCODONE-acetaminophen (PERCOCET) 10-325 MG tablet TAKE 1 TAB EVERY 4 HOURS FOR 5 DAYS THEN 1 TAB TWICE A DAY AS NEEDED FOR SEVERE BREAKTHROUGH PAIN   primidone (MYSOLINE) 50 MG tablet Take 1 tablet (50 mg total) by mouth 2 (two) times daily.   Tiotropium Bromide Monohydrate (SPIRIVA RESPIMAT) 2.5 MCG/ACT AERS Inhale 1 puff into the lungs 2 (two) times daily.   Vitamin D, Ergocalciferol, (DRISDOL) 50000 units CAPS capsule Take 1 capsule by mouth once a week.   ZTLIDO 1.8 %  [place] SMARTSIG:1 Patch(s) Topical As Needed   No facility-administered encounter medications on file as of 11/30/2020.   Lipid Panel    Component Value Date/Time   CHOL 183 07/26/2020 1117   TRIG 102 07/26/2020 1117   HDL 82 07/26/2020 1117   LDLCALC 83 07/26/2020 1117    10-year ASCVD risk score: The 10-year ASCVD risk score Connie Coffey DC Jr., et al., 2013) is: 11.9%   Values used to calculate the score:     Age: 26 years     Sex: Female     Is Non-Hispanic African American: No     Diabetic: No     Tobacco smoker: Yes     Systolic Blood Pressure: 891 mmHg     Is BP  treated: No     HDL Cholesterol: 82 mg/dL     Total Cholesterol: 183 mg/dL  Current antihyperlipidemic regimen:  atorvastatin 40 mg daily Fenofibrate 160 mg daily Previous antihyperlipidemic medications tried: None   ASCVD risk enhancing conditions: age >48  What recent interventions/DTPs have been made by any provider to improve Cholesterol control since last CPP Visit: Patient is taking her medication, she stated she his having a lot of back pain, she stated she is going to have a pain stimulator placed for her back pain. She stated she is going to watch a disc on the procedure and the device as to how it works.  She is ready to get some relief.   Any recent hospitalizations or ED visits since last visit with CPP? No  What diet changes have been made to improve Cholesterol?  Patient is watching her diet  What exercise is being done to improve Cholesterol?  Patient tries to stay active, but is limited due to her back pain  Adherence Review: Does the patient have >5 day gap between last estimated fill dates? No  Star Rating Drugs Medication Name Last Fill Days supply Atorvastatin  09/29/20  90 Fenofibrate  10/31/20  90 Levothyroxine  09/29/20  90     Care Gaps: Last annual wellness visit? None listed  Connie Coffey, [facility] Pharmacist Assistant [phone]

## 2020-12-01 ENCOUNTER — Other Ambulatory Visit: Payer: 59

## 2020-12-01 ENCOUNTER — Other Ambulatory Visit: Payer: Self-pay

## 2020-12-01 DIAGNOSIS — D75839 Thrombocytosis, unspecified: Secondary | ICD-10-CM

## 2020-12-01 LAB — CBC
Hematocrit: 42.4 % (ref 34.0–46.6)
Hemoglobin: 14 g/dL (ref 11.1–15.9)
MCH: 29 pg (ref 26.6–33.0)
MCHC: 33 g/dL (ref 31.5–35.7)
MCV: 88 fL (ref 79–97)
Platelets: 472 10*3/uL — ABNORMAL HIGH (ref 150–450)
RBC: 4.82 x10E6/uL (ref 3.77–5.28)
RDW: 13.9 % (ref 11.7–15.4)
WBC: 13.4 10*3/uL — ABNORMAL HIGH (ref 3.4–10.8)

## 2021-01-30 ENCOUNTER — Other Ambulatory Visit: Payer: Self-pay | Admitting: Family Medicine

## 2021-01-31 NOTE — Progress Notes (Signed)
Subjective:  Patient ID: Connie Coffey, female    DOB: 09-15-1955  Age: 65 y.o. MRN: [id number]  Chief Complaint  Patient presents with   Hyperlipidemia   Depression    HPI Is a 65 year old white female who presents for follow-up of chronic medical issues.  Her main concern is the significant amount of pain she is currently in.  She has significant issues with her lumbar back however she is unable to undergo surgery due to her severe osteoporosis.  She is currently on Tymlos 80 mcg once daily in the hopes that increasing her bone mass.  It is too early to repeat a bone density test.  She is seen at the pain clinic.  Is currently on gabapentin 600 mg 4 times a day, cyclobenzaprine 10 mg 3 times a day, and recently per the patient the pain clinic said she could increase her Percocet 10/325 mg to every 4 hours from every 6 hours.  She does not do this very often because of the nerve pain that causes most of her issues.  Percocet does not seem to help with this is much.  She is considering proceeding with a spine stimulator. Pt has naloxone.   Osteoporosis: In addition to the [facility] the patient is taking vitamin D 50,000 units once weekly. Hyperlipidemia: Patient is currently on fenofibrate and Lipitor.  Patient not been eating as healthy as usual due to her pain.  She has been eating more fast food or sandwiches at home.  Unable to exercise other than some walking to and from her garden.  Depression: On Cymbalta 60 mg once daily, clonazepam 0.5 mg twice daily, buspirone 30 mg one twice a day. Marland Kitchen  GERD: on omeprazole. No breakthrough gerd.  Asthma/allergic rhinitis: Patient is currently on Spiriva 1 puff twice daily, has albuterol for rescue inhaler, Xyzal 5 mg once daily, and Singulair 10 mg once daily.  Her asthma is fairly well controlled.  Denies any shortness of breath or coughing. She is smoking less.  Smoking a half a pack a day.  Constipation: on amitiza 8 mcg once twice a day. Drinks high  protein shake which helps her bowels.  Adrenal insufficiency. Sees Peri Jefferson, NP annually. Pt is on hydrocortisone 20 mg 1 1/2 daily.   Tremor: started on primidone. Helped her tremor.  Urge incontinence and is not fully emptying her bladder. On bethanechol 25 mg bid. Pt sees urology Clare Gandy, NP.)  Current Outpatient Medications on File Prior to Visit  Medication Sig Dispense Refill   Abaloparatide (TYMLOS) 3120 MCG/1.56ML SOPN Inject 80 mcg into the skin daily. 4.68 mL 1   albuterol (VENTOLIN HFA) 108 (90 Base) MCG/ACT inhaler Inhale 2 puffs into the lungs every 4 (four) hours as needed. 18 g 2   bethanechol (URECHOLINE) 25 MG tablet Take 1 tablet (25 mg total) by mouth in the morning and at bedtime. 180 tablet 1   busPIRone (BUSPAR) 30 MG tablet Take 1 tablet (30 mg total) by mouth 2 (two) times daily. 180 tablet 1   clonazePAM (KLONOPIN) 0.5 MG tablet TAKE 1 TABLET BY MOUTH TWICE A DAY 60 tablet 1   cyclobenzaprine (FLEXERIL) 10 MG tablet Take 1 tablet (10 mg total) by mouth 3 (three) times daily. 270 tablet 1   D-Mannose 500 MG CAPS Take by mouth.     diclofenac Sodium (VOLTAREN) 1 % GEL Apply 20 g topically 4 (four) times daily. 20 g 0   DULoxetine (CYMBALTA) 60 MG capsule Take 1  capsule (60 mg total) by mouth daily. 180 capsule 1   DYMISTA 137-50 MCG/ACT SUSP One spray each nostril twice a day. 23 g 3   fenofibrate 160 MG tablet Take 1 tablet (160 mg total) by mouth daily. 90 tablet 1   gabapentin (NEURONTIN) 600 MG tablet Take 1 tablet (600 mg total) by mouth 4 (four) times daily. 360 tablet 1   hydrocortisone (CORTEF) 20 MG tablet Take 1.5 tablets (30 mg total) by mouth daily. Take 1 tablet in the morning and 1/2 tablet at 4pm 135 tablet 1   Insulin Pen Needle (GLOBAL EASY GLIDE PEN NEEDLES) 32G X 4 MM MISC Use one needle daily as directed     levocetirizine (XYZAL) 5 MG tablet TAKE 1 TABLET BY MOUTH EVERY DAY IN THE EVENING 90 tablet 1   lubiprostone (AMITIZA) 8 MCG capsule  Take 1 capsule (8 mcg total) by mouth 2 (two) times daily with a meal. 180 capsule 1   montelukast (SINGULAIR) 10 MG tablet Take 1 tablet (10 mg total) by mouth daily. 90 tablet 3   Multiple Vitamin (MULTI-VITAMINS) TABS Take 1 tablet by mouth daily.     naloxone (NARCAN) nasal spray 4 mg/0.1 mL Place 1 spray into the nose once. (for suspected overdose)     omeprazole (PRILOSEC) 40 MG capsule Take 1 capsule (40 mg total) by mouth 2 (two) times daily. 180 capsule 3   oxyCODONE-acetaminophen (PERCOCET) 10-325 MG tablet TAKE 1 TAB EVERY 4 HOURS FOR 5 DAYS THEN 1 TAB TWICE A DAY AS NEEDED FOR SEVERE BREAKTHROUGH PAIN  0   primidone (MYSOLINE) 50 MG tablet Take 1 tablet (50 mg total) by mouth 2 (two) times daily. 180 tablet 1   Tiotropium Bromide Monohydrate (SPIRIVA RESPIMAT) 2.5 MCG/ACT AERS Inhale 1 puff into the lungs 2 (two) times daily. 12 g 1   Vitamin D, Ergocalciferol, (DRISDOL) 50000 units CAPS capsule Take 1 capsule by mouth once a week.     No current facility-administered medications on file prior to visit.   Past Medical History:  Diagnosis Date   Adrenal insufficiency (HCC)    Arthritis    Carpal tunnel syndrome    Both hands, worse in right   Chronic constipation    COPD (chronic obstructive pulmonary disease) (HCC)    CVA (cerebral vascular accident) ([place])    Facet syndrome    Fibromyalgia    GERD (gastroesophageal reflux disease)    Heart murmur    Hypercholesteremia    IBS (irritable bowel syndrome)    Ischemic stroke ([place])    07/19/2004   Lumbar radiculopathy    Osteoporosis    RLS (restless legs syndrome)    Spinal stenosis    TMJ (temporomandibular joint disorder)    Trochanteric bursitis of both hips    Vascular malformation    spinal cord   Past Surgical History:  Procedure Laterality Date   APPENDECTOMY     CARPAL TUNNEL RELEASE Right 12/13/2017   surgery   CHOLECYSTECTOMY     COLONOSCOPY  05/06/2017   Colonic polyps status post polypectomy. Interan  land external hemorrhoids.    ESOPHAGOGASTRODUODENOSCOPY  04/28/2007   Mild gastrtiis. Otherwise normal EGD.    HEMORROIDECTOMY     LAMINECTOMY AND MICRODISCECTOMY LUMBAR SPINE  01/03/2005   NECK SURGERY     x2    OTHER SURGICAL HISTORY  04/24/2004   anterior cervical discectomy and fusion at C5-C6   TONSILLECTOMY     TOTAL ABDOMINAL HYSTERECTOMY  Family History  Problem Relation Age of Onset   Supraventricular tachycardia Mother    Dementia Mother    Hyperlipidemia Mother    Hypertension Mother    Osteoarthritis Mother    Osteoporosis Mother    Breast cancer Other    Breast cancer Other    Colon cancer Neg Hx    Esophageal cancer Neg Hx    Social History   Socioeconomic History   Marital status: Divorced    Spouse name: Not on file   Number of children: 1   Years of education: Not on file   Highest education level: Not on file  Occupational History   Occupation: Disabled  Tobacco Use   Smoking status: Every Day    Packs/day: 1.00    Types: Cigarettes   Smokeless tobacco: Never  Vaping Use   Vaping Use: Never used  Substance and Sexual Activity   Alcohol use: Not Currently    Comment: quit 19 years   Drug use: Never   Sexual activity: Not Currently  Other Topics Concern   Not on file  Social History Narrative   Disabled son lives with her sometimes and his father sometimes.  Harold is working hard to have a relationship with her son's father and his wife.  They help her when needed.   Social Determinants of Health   Financial Resource Strain: Not on file  Food Insecurity: Not on file  Transportation Needs: No Transportation Needs   Lack of Transportation (Medical): No   Lack of Transportation (Non-Medical): No  Physical Activity: Not on file  Stress: Not on file  Social Connections: Not on file    Review of Systems  Constitutional:  Negative for chills, fatigue and fever.  HENT:  Positive for congestion. Negative for ear pain, rhinorrhea and sore  throat.   Respiratory:  Positive for cough. Negative for shortness of breath.   Cardiovascular:  Negative for chest pain.  Gastrointestinal:  Negative for abdominal pain, constipation, diarrhea, nausea and vomiting.       Dysphagia in back of throat.    Genitourinary:  Negative for dysuria and urgency.  Musculoskeletal:  Positive for arthralgias and back pain. Negative for myalgias.  Neurological:  Positive for light-headedness. Negative for dizziness, weakness and headaches.  Psychiatric/Behavioral:  Negative for dysphoric mood. The patient is not nervous/anxious.     Objective:  BP 110/60   Pulse 96   Temp (!) 97.1 F (36.2 C)   Resp 18   Ht '5\' 1"'$  (1.549 m)   Wt 142 lb 9.6 oz (64.7 kg)   BMI 26.94 kg/m   BP/Weight 02/06/2021 02/01/2021 [id number]  Systolic BP 0000000 [id number]  Diastolic BP 64 60 74  Wt. (Lbs) 146.6 142.6 144.8  BMI 27.7 26.94 27.36    Physical Exam Vitals reviewed.  Constitutional:      Appearance: Normal appearance. She is normal weight.  Neck:     Vascular: No carotid bruit.  Cardiovascular:     Rate and Rhythm: Normal rate and regular rhythm.     Pulses: Normal pulses.     Heart sounds: Normal heart sounds.  Pulmonary:     Effort: Pulmonary effort is normal. No respiratory distress.     Breath sounds: Wheezing present.  Abdominal:     General: Abdomen is flat. Bowel sounds are normal.     Palpations: Abdomen is soft.     Tenderness: There is no abdominal tenderness.  Neurological:     Mental Status: She is alert  and oriented to person, place, and time.  Psychiatric:        Mood and Affect: Mood normal.        Behavior: Behavior normal.    Diabetic Foot Exam - Simple   No data filed      Lab Results  Component Value Date   WBC 14.2 (H) 02/01/2021   HGB 13.6 02/01/2021   HCT 42.0 02/01/2021   PLT 500 (H) 02/01/2021   GLUCOSE 97 02/01/2021   CHOL 166 02/01/2021   TRIG 96 02/01/2021   HDL 79 02/01/2021   LDLCALC 70 02/01/2021   ALT 11  02/01/2021   AST 14 02/01/2021   NA 141 02/01/2021   K 4.7 02/01/2021   CL 100 02/01/2021   CREATININE 0.78 02/01/2021   BUN 12 02/01/2021   CO2 25 02/01/2021   TSH 1.550 02/01/2021      Assessment & Plan:   Problem List Items Addressed This Visit       Respiratory   Simple chronic bronchitis (HCC)    The current medical regimen is effective;  continue present plan and medications.         Endocrine   Acquired hypothyroidism    The current medical regimen is effective;  continue present plan and medications.       Relevant Orders   CBC with Differential/Platelet (Completed)   Comprehensive metabolic panel (Completed)   TSH (Completed)   Hyperparathyroidism ([place])    Management per specialist.  The current medical regimen is effective;  continue present plan and medications.      Adrenal insufficiency (Addison's disease) ([place])    The current medical regimen is effective;  continue present plan and medications.         Nervous and Auditory   Sciatica of right side associated with disorder of lumbar spine    Management per specialist.          Other   Chronic pain syndrome    Management per specialist.        Vitamin D deficiency    The current medical regimen is effective;  continue present plan and medications.       Mixed hyperlipidemia - Primary    Well controlled.  No changes to medicines.  Continue to work on eating a healthy diet and exercise.  Labs drawn today.        Relevant Orders   Lipid panel (Completed)   Absence of bladder continence   Uncomplicated opioid dependence ([place])    Management per specialist.        Mild recurrent major depression ([place])    The current medical regimen is effective;  continue present plan and medications. Management per specialist.        Other Visit Diagnoses     Flu vaccine need       Relevant Orders   Flu Vaccine QUAD High Dose(Fluad) (Completed)         Orders Placed This  Encounter  Procedures   Flu Vaccine QUAD High Dose(Fluad)   CBC with Differential/Platelet   Comprehensive metabolic panel   Lipid panel   TSH   Cardiovascular Risk Assessment    I spent 40 minutes with patient reviewing her chart and in a face to face visit. Discussed her pain in particular as well as her copd and quitting smoking in depth.   Follow-up: Return in about 3 months (around 05/03/2021) for chronic fasting.  An After Visit Summary was printed and given to the patient.  [name], MD [facility] [phone]

## 2021-02-01 ENCOUNTER — Encounter: Payer: Self-pay | Admitting: Family Medicine

## 2021-02-01 ENCOUNTER — Other Ambulatory Visit: Payer: Self-pay

## 2021-02-01 ENCOUNTER — Ambulatory Visit (INDEPENDENT_AMBULATORY_CARE_PROVIDER_SITE_OTHER): Payer: Medicare Other | Admitting: Family Medicine

## 2021-02-01 ENCOUNTER — Encounter: Payer: Self-pay | Admitting: Acute Care

## 2021-02-01 VITALS — BP 110/60 | HR 96 | Temp 97.1°F | Resp 18 | Ht 61.0 in | Wt 142.6 lb

## 2021-02-01 DIAGNOSIS — J410 Simple chronic bronchitis: Secondary | ICD-10-CM

## 2021-02-01 DIAGNOSIS — E782 Mixed hyperlipidemia: Secondary | ICD-10-CM | POA: Diagnosis not present

## 2021-02-01 DIAGNOSIS — E559 Vitamin D deficiency, unspecified: Secondary | ICD-10-CM

## 2021-02-01 DIAGNOSIS — G894 Chronic pain syndrome: Secondary | ICD-10-CM

## 2021-02-01 DIAGNOSIS — E213 Hyperparathyroidism, unspecified: Secondary | ICD-10-CM

## 2021-02-01 DIAGNOSIS — Z23 Encounter for immunization: Secondary | ICD-10-CM

## 2021-02-01 DIAGNOSIS — M5386 Other specified dorsopathies, lumbar region: Secondary | ICD-10-CM

## 2021-02-01 DIAGNOSIS — F1120 Opioid dependence, uncomplicated: Secondary | ICD-10-CM

## 2021-02-01 DIAGNOSIS — E039 Hypothyroidism, unspecified: Secondary | ICD-10-CM | POA: Diagnosis not present

## 2021-02-01 DIAGNOSIS — R32 Unspecified urinary incontinence: Secondary | ICD-10-CM | POA: Insufficient documentation

## 2021-02-01 DIAGNOSIS — F330 Major depressive disorder, recurrent, mild: Secondary | ICD-10-CM | POA: Diagnosis not present

## 2021-02-01 DIAGNOSIS — N39498 Other specified urinary incontinence: Secondary | ICD-10-CM

## 2021-02-01 DIAGNOSIS — E271 Primary adrenocortical insufficiency: Secondary | ICD-10-CM

## 2021-02-02 ENCOUNTER — Other Ambulatory Visit: Payer: Self-pay | Admitting: Family Medicine

## 2021-02-02 ENCOUNTER — Other Ambulatory Visit: Payer: Self-pay | Admitting: Legal Medicine

## 2021-02-02 DIAGNOSIS — R339 Retention of urine, unspecified: Secondary | ICD-10-CM | POA: Diagnosis not present

## 2021-02-02 DIAGNOSIS — N390 Urinary tract infection, site not specified: Secondary | ICD-10-CM | POA: Diagnosis not present

## 2021-02-02 LAB — COMPREHENSIVE METABOLIC PANEL
ALT: 11 IU/L (ref 0–32)
AST: 14 IU/L (ref 0–40)
Albumin/Globulin Ratio: 2.3 — ABNORMAL HIGH (ref 1.2–2.2)
Albumin: 4.3 g/dL (ref 3.8–4.8)
Alkaline Phosphatase: 42 IU/L — ABNORMAL LOW (ref 44–121)
BUN/Creatinine Ratio: 15 (ref 12–28)
BUN: 12 mg/dL (ref 8–27)
Bilirubin Total: <0.2 mg/dL (ref 0.0–1.2)
CO2: 25 mmol/L (ref 20–29)
Calcium: 10 mg/dL (ref 8.7–10.3)
Chloride: 100 mmol/L (ref 96–106)
Creatinine, Ser: 0.78 mg/dL (ref 0.57–1.00)
Globulin, Total: 1.9 g/dL (ref 1.5–4.5)
Glucose: 97 mg/dL (ref 65–99)
Potassium: 4.7 mmol/L (ref 3.5–5.2)
Sodium: 141 mmol/L (ref 134–144)
Total Protein: 6.2 g/dL (ref 6.0–8.5)
eGFR: 84 mL/min/{1.73_m2} (ref >59)

## 2021-02-02 LAB — CBC WITH DIFFERENTIAL/PLATELET
Basophils Absolute: 0.1 10*3/uL (ref 0.0–0.2)
Basos: 1 %
EOS (ABSOLUTE): 0.1 10*3/uL (ref 0.0–0.4)
Eos: 1 %
Hematocrit: 42 % (ref 34.0–46.6)
Hemoglobin: 13.6 g/dL (ref 11.1–15.9)
Immature Grans (Abs): 0.1 10*3/uL (ref 0.0–0.1)
Immature Granulocytes: 1 %
Lymphocytes Absolute: 1.8 10*3/uL (ref 0.7–3.1)
Lymphs: 13 %
MCH: 28.6 pg (ref 26.6–33.0)
MCHC: 32.4 g/dL (ref 31.5–35.7)
MCV: 88 fL (ref 79–97)
Monocytes Absolute: 0.7 10*3/uL (ref 0.1–0.9)
Monocytes: 5 %
Neutrophils Absolute: 11.4 10*3/uL — ABNORMAL HIGH (ref 1.4–7.0)
Neutrophils: 79 %
Platelets: 500 10*3/uL — ABNORMAL HIGH (ref 150–450)
RBC: 4.76 x10E6/uL (ref 3.77–5.28)
RDW: 13.6 % (ref 11.7–15.4)
WBC: 14.2 10*3/uL — ABNORMAL HIGH (ref 3.4–10.8)

## 2021-02-02 LAB — CARDIOVASCULAR RISK ASSESSMENT

## 2021-02-02 LAB — LIPID PANEL
Chol/HDL Ratio: 2.1 ratio (ref 0.0–4.4)
Cholesterol, Total: 166 mg/dL (ref 100–199)
HDL: 79 mg/dL (ref >39)
LDL Chol Calc (NIH): 70 mg/dL (ref 0–99)
Triglycerides: 96 mg/dL (ref 0–149)
VLDL Cholesterol Cal: 17 mg/dL (ref 5–40)

## 2021-02-02 LAB — TSH: TSH: 1.55 u[IU]/mL (ref 0.450–4.500)

## 2021-02-06 ENCOUNTER — Other Ambulatory Visit: Payer: Self-pay

## 2021-02-06 ENCOUNTER — Ambulatory Visit (INDEPENDENT_AMBULATORY_CARE_PROVIDER_SITE_OTHER): Payer: Medicare Other

## 2021-02-06 VITALS — BP 138/64 | HR 95 | Resp 16 | Ht 61.0 in | Wt 146.6 lb

## 2021-02-06 DIAGNOSIS — Z0000 Encounter for general adult medical examination without abnormal findings: Secondary | ICD-10-CM | POA: Diagnosis not present

## 2021-02-06 DIAGNOSIS — Z1231 Encounter for screening mammogram for malignant neoplasm of breast: Secondary | ICD-10-CM

## 2021-02-06 DIAGNOSIS — Z23 Encounter for immunization: Secondary | ICD-10-CM

## 2021-02-06 DIAGNOSIS — M810 Age-related osteoporosis without current pathological fracture: Secondary | ICD-10-CM | POA: Diagnosis not present

## 2021-02-06 DIAGNOSIS — F17218 Nicotine dependence, cigarettes, with other nicotine-induced disorders: Secondary | ICD-10-CM

## 2021-02-06 NOTE — Patient Instructions (Signed)
Health Maintenance, Female Adopting a healthy lifestyle and getting preventive care are important in promoting health and wellness. Ask your health care provider about: The right schedule for you to have regular tests and exams. Things you can do on your own to prevent diseases and keep yourself healthy. What should I know about diet, weight, and exercise? Eat a healthy diet  Eat a diet that includes plenty of vegetables, fruits, low-fat dairy products, and lean protein. Do not eat a lot of foods that are high in solid fats, added sugars, or sodium. Maintain a healthy weight Body mass index (BMI) is used to identify weight problems. It estimates body fat based on height and weight. Your health care provider can help determine your BMI and help you achieve or maintain a healthy weight. Get regular exercise Get regular exercise. This is one of the most important things you can do for your health. Most adults should: Exercise for at least 150 minutes each week. The exercise should increase your heart rate and make you sweat (moderate-intensity exercise). Do strengthening exercises at least twice a week. This is in addition to the moderate-intensity exercise. Spend less time sitting. Even light physical activity can be beneficial. Watch cholesterol and blood lipids Have your blood tested for lipids and cholesterol at 67 years of age, then have this test every 5 years. Have your cholesterol levels checked more often if: Your lipid or cholesterol levels are high. You are older than 67 years of age. You are at high risk for heart disease. What should I know about cancer screening? Depending on your health history and family history, you may need to have cancer screening at various ages. This may include screening for: Breast cancer. Cervical cancer. Colorectal cancer. Skin cancer. Lung cancer. What should I know about heart disease, diabetes, and high blood pressure? Blood pressure and heart  disease High blood pressure causes heart disease and increases the risk of stroke. This is more likely to develop in people who have high blood pressure readings, are of African descent, or are overweight. Have your blood pressure checked: Every 3-5 years if you are 18-39 years of age. Every year if you are 40 years old or older. Diabetes Have regular diabetes screenings. This checks your fasting blood sugar level. Have the screening done: Once every three years after age 40 if you are at a normal weight and have a low risk for diabetes. More often and at a younger age if you are overweight or have a high risk for diabetes. What should I know about preventing infection? Hepatitis B If you have a higher risk for hepatitis B, you should be screened for this virus. Talk with your health care provider to find out if you are at risk for hepatitis B infection. Hepatitis C Testing is recommended for: Everyone born from 1945 through 1965. Anyone with known risk factors for hepatitis C. Sexually transmitted infections (STIs) Get screened for STIs, including gonorrhea and chlamydia, if: You are sexually active and are younger than 67 years of age. You are older than 67 years of age and your health care provider tells you that you are at risk for this type of infection. Your sexual activity has changed since you were last screened, and you are at increased risk for chlamydia or gonorrhea. Ask your health care provider if you are at risk. Ask your health care provider about whether you are at high risk for HIV. Your health care provider may recommend a prescription medicine   to help prevent HIV infection. If you choose to take medicine to prevent HIV, you should first get tested for HIV. You should then be tested every 3 months for as [place] as you are taking the medicine. Pregnancy If you are about to stop having your period (premenopausal) and you may become pregnant, seek counseling before you get  pregnant. Take 400 to 800 micrograms (mcg) of folic acid every day if you become pregnant. Ask for birth control (contraception) if you want to prevent pregnancy. Osteoporosis and menopause Osteoporosis is a disease in which the bones lose minerals and strength with aging. This can result in bone fractures. If you are 67 years old or older, or if you are at risk for osteoporosis and fractures, ask your health care provider if you should: Be screened for bone loss. Take a calcium or vitamin D supplement to lower your risk of fractures. Be given hormone replacement therapy (HRT) to treat symptoms of menopause. Follow these instructions at home: Lifestyle Do not use any products that contain nicotine or tobacco, such as cigarettes, e-cigarettes, and chewing tobacco. If you need help quitting, ask your health care provider. Do not use street drugs. Do not share needles. Ask your health care provider for help if you need support or information about quitting drugs. Alcohol use Do not drink alcohol if: Your health care provider tells you not to drink. You are pregnant, may be pregnant, or are planning to become pregnant. If you drink alcohol: Limit how much you use to 0-1 drink a day. Limit intake if you are breastfeeding. Be aware of how much alcohol is in your drink. In the [place] one drink equals one 12 oz bottle of beer (355 mL), one 5 oz glass of wine (148 mL), or one 1 oz glass of hard liquor (44 mL). General instructions Schedule regular health, dental, and eye exams. Stay current with your vaccines. Tell your health care provider if: You often feel depressed. You have ever been abused or do not feel safe at home. Summary Adopting a healthy lifestyle and getting preventive care are important in promoting health and wellness. Follow your health care provider's instructions about healthy diet, exercising, and getting tested or screened for diseases. Follow your health care provider's  instructions on monitoring your cholesterol and blood pressure. This information is not intended to replace advice given to you by your health care provider. Make sure you discuss any questions you have with your health care provider. Document Revised: 07/22/2020 Document Reviewed: 05/07/2018 Elsevier Patient Education  2022 [place].  Steps to Quit Smoking Smoking tobacco is the leading cause of preventable death. It can affect almost every organ in the body. Smoking puts you and those around you at risk for developing many serious chronic diseases. Quitting smoking can be difficult, but it is one of the best things that you can do for your health. It is never too late to quit. How do I get ready to quit? When you decide to quit smoking, create a plan to help you succeed. Before you quit: Pick a date to quit. Set a date within the next 2 weeks to give you time to prepare. Write down the reasons why you are quitting. Keep this list in places where you will see it often. Tell your family, friends, and co-workers that you are quitting. Support from your loved ones can make quitting easier. Talk with your health care provider about your options for quitting smoking. Find out what treatment options  are covered by your health insurance. Identify people, places, things, and activities that make you want to smoke (triggers). Avoid them. What first steps can I take to quit smoking? Throw away all cigarettes at home, at work, and in your car. Throw away smoking accessories, such as Scientist, research (medical). Clean your car. Make sure to empty the ashtray. Clean your home, including curtains and carpets. What strategies can I use to quit smoking? Talk with your health care provider about combining strategies, such as taking medicines while you are also receiving in-person counseling. Using these two strategies together makes you more likely to succeed in quitting than if you used either strategy on its  own. If you are pregnant or breastfeeding, talk with your health care provider about finding counseling or other support strategies to quit smoking. Do not take medicine to help you quit smoking unless your health care provider tells you to do so. To quit smoking: Quit right away Quit smoking completely, instead of gradually reducing how much you smoke over a period of time. Research shows that stopping smoking right away is more successful than gradually quitting. Attend in-person counseling to help you build problem-solving skills. You are more likely to succeed in quitting if you attend counseling sessions regularly. Even short sessions of 10 minutes can be effective. Take medicine You may take medicines to help you quit smoking. Some medicines require a prescription and some you can purchase over-the-counter. Medicines may have nicotine in them to replace the nicotine in cigarettes. Medicines may: Help to stop cravings. Help to relieve withdrawal symptoms. Your health care provider may recommend: Nicotine patches, gum, or lozenges. Nicotine inhalers or sprays. Non-nicotine medicine that is taken by mouth. Find resources Find resources and support systems that can help you to quit smoking and remain smoke-free after you quit. These resources are most helpful when you use them often. They include: Online chats with a Social worker. Telephone quitlines. Printed Furniture conservator/restorer. Support groups or group counseling. Text messaging programs. Mobile phone apps or applications. Use apps that can help you stick to your quit plan by providing reminders, tips, and encouragement. There are many free apps for mobile devices as well as websites. Examples include Quit Guide from the [employer] and smokefree.gov What things can I do to make it easier to quit?  Reach out to your family and friends for support and encouragement. Call telephone quitlines (1-800-QUIT-NOW), reach out to support groups, or work with a  counselor for support. Ask people who smoke to avoid smoking around you. Avoid places that trigger you to smoke, such as bars, parties, or smoke-break areas at work. Spend time with people who do not smoke. Lessen the stress in your life. Stress can be a smoking trigger for some people. To lessen stress, try: Exercising regularly. Doing deep-breathing exercises. Doing yoga. Meditating. Performing a body scan. This involves closing your eyes, scanning your body from head to toe, and noticing which parts of your body are particularly tense. Try to relax the muscles in those areas. How will I feel when I quit smoking? Day 1 to 3 weeks Within the first 24 hours of quitting smoking, you may start to feel withdrawal symptoms. These symptoms are usually most noticeable 2-3 days after quitting, but they usually do not last for more than 2-3 weeks. You may experience these symptoms: Mood swings. Restlessness, anxiety, or irritability. Trouble concentrating. Dizziness. Strong cravings for sugary foods and nicotine. Mild weight gain. Constipation. Nausea. Coughing or a sore throat.  Changes in how the medicines that you take for unrelated issues work in your body. Depression. Trouble sleeping (insomnia). Week 3 and afterward After the first 2-3 weeks of quitting, you may start to notice more positive results, such as: Improved sense of smell and taste. Decreased coughing and sore throat. Slower heart rate. Lower blood pressure. Clearer skin. The ability to breathe more easily. Fewer sick days. Quitting smoking can be very challenging. Do not get discouraged if you are not successful the first time. Some people need to make many attempts to quit before they achieve [place]-term success. Do your best to stick to your quit plan, and talk with your health care provider if you have any questions or concerns. Summary Smoking tobacco is the leading cause of preventable death. Quitting smoking is one of  the best things that you can do for your health. When you decide to quit smoking, create a plan to help you succeed. Quit smoking right away, not slowly over a period of time. When you start quitting, seek help from your health care provider, family, or friends. This information is not intended to replace advice given to you by your health care provider. Make sure you discuss any questions you have with your health care provider. Document Revised: 02/06/2019 Document Reviewed: 08/02/2018 Elsevier Patient Education  [place].

## 2021-02-06 NOTE — Progress Notes (Signed)
Subjective:   Connie Coffey is a 65 y.o. female who presents for Medicare Annual (Subsequent) preventive examination.  This wellness visit is conducted by a nurse.  The patient's medications were reviewed and reconciled since the patient's last visit.  History details were provided by the patient.  The history appears to be reliable.    Patient's last AWV was one year ago.   Medical History: Patient history and Family history was reviewed  Medications, Allergies, and preventative health maintenance was reviewed and updated.   Review of Systems    Review of Systems  Constitutional: Negative.   HENT: Negative.    Eyes: Negative.   Respiratory:  Positive for cough.   Cardiovascular:  Negative for chest pain and palpitations.  Gastrointestinal: Negative.   Musculoskeletal:  Positive for back pain.  Neurological:  Positive for numbness.  Psychiatric/Behavioral:  Negative for suicidal ideas.    Cardiac Risk Factors include: advanced age (>57mn, >>51women);hypertension;smoking/ tobacco exposure     Objective:    Today's Vitals   02/06/21 0944  BP: 138/64  Pulse: 95  Resp: 16  SpO2: 93%  Weight: 146 lb 9.6 oz (66.5 kg)  Height: '5\' 1"'$  (1.549 m)  PainSc: 8    Body mass index is 27.7 kg/m.  Advanced Directives 02/06/2021 10/21/2020 09/22/2020 06/30/2019  Does Patient Have a Medical Advance Directive? Yes Yes Yes Yes  Type of [place] - Living will;Healthcare Power of [place] Does patient want to make changes to medical advance directive? No - Patient declined - - No - Patient declined  Copy of [place] Chart? Yes - validated most recent copy scanned in chart (See row information) - - -    Current Medications (verified) Outpatient Encounter Medications as of 02/06/2021  Medication Sig   Abaloparatide (TYMLOS) 3120 MCG/1.56ML SOPN Inject 80 mcg into the skin daily.   albuterol  (VENTOLIN HFA) 108 (90 Base) MCG/ACT inhaler Inhale 2 puffs into the lungs every 4 (four) hours as needed.   atorvastatin (LIPITOR) 40 MG tablet TAKE 1 TABLET BY MOUTH  DAILY   bethanechol (URECHOLINE) 25 MG tablet Take 1 tablet (25 mg total) by mouth in the morning and at bedtime.   busPIRone (BUSPAR) 30 MG tablet Take 1 tablet (30 mg total) by mouth 2 (two) times daily.   clonazePAM (KLONOPIN) 0.5 MG tablet TAKE 1 TABLET BY MOUTH TWICE A DAY   cyclobenzaprine (FLEXERIL) 10 MG tablet Take 1 tablet (10 mg total) by mouth 3 (three) times daily.   D-Mannose 500 MG CAPS Take by mouth.   diclofenac Sodium (VOLTAREN) 1 % GEL Apply 20 g topically 4 (four) times daily.   DULoxetine (CYMBALTA) 60 MG capsule Take 1 capsule (60 mg total) by mouth daily.   DYMISTA 137-50 MCG/ACT SUSP One spray each nostril twice a day.   fenofibrate 160 MG tablet Take 1 tablet (160 mg total) by mouth daily.   gabapentin (NEURONTIN) 600 MG tablet Take 1 tablet (600 mg total) by mouth 4 (four) times daily.   hydrocortisone (CORTEF) 20 MG tablet Take 1.5 tablets (30 mg total) by mouth daily. Take 1 tablet in the morning and 1/2 tablet at 4pm   Insulin Pen Needle (GLOBAL EASY GLIDE PEN NEEDLES) 32G X 4 MM MISC Use one needle daily as directed   levocetirizine (XYZAL) 5 MG tablet TAKE 1 TABLET BY MOUTH EVERY DAY IN THE EVENING   levothyroxine (SYNTHROID)  50 MCG tablet TAKE 1 TABLET BY MOUTH IN  THE MORNING   lubiprostone (AMITIZA) 8 MCG capsule Take 1 capsule (8 mcg total) by mouth 2 (two) times daily with a meal.   montelukast (SINGULAIR) 10 MG tablet Take 1 tablet (10 mg total) by mouth daily.   Multiple Vitamin (MULTI-VITAMINS) TABS Take 1 tablet by mouth daily.   naloxone (NARCAN) nasal spray 4 mg/0.1 mL Place 1 spray into the nose once. (for suspected overdose)   omeprazole (PRILOSEC) 40 MG capsule Take 1 capsule (40 mg total) by mouth 2 (two) times daily.   oxyCODONE-acetaminophen (PERCOCET) 10-325 MG tablet TAKE 1 TAB  EVERY 4 HOURS FOR 5 DAYS THEN 1 TAB TWICE A DAY AS NEEDED FOR SEVERE BREAKTHROUGH PAIN   primidone (MYSOLINE) 50 MG tablet Take 1 tablet (50 mg total) by mouth 2 (two) times daily.   Tiotropium Bromide Monohydrate (SPIRIVA RESPIMAT) 2.5 MCG/ACT AERS Inhale 1 puff into the lungs 2 (two) times daily.   Vitamin D, Ergocalciferol, (DRISDOL) 50000 units CAPS capsule Take 1 capsule by mouth once a week.   No facility-administered encounter medications on file as of 02/06/2021.    Allergies (verified) Beta vulgaris, Celebrex [celecoxib], Chantix [varenicline], Dog epithelium allergy skin test, Fish oil, Gramineae pollens, Molds & smuts, Nsaids, Other, Nicotrol [nicotine], and Penicillins   History: Past Medical History:  Diagnosis Date   Adrenal insufficiency (HCC)    Arthritis    Carpal tunnel syndrome    Both hands, worse in right   Chronic constipation    COPD (chronic obstructive pulmonary disease) (HCC)    CVA (cerebral vascular accident) ([place])    Facet syndrome    Fibromyalgia    GERD (gastroesophageal reflux disease)    Heart murmur    Hypercholesteremia    IBS (irritable bowel syndrome)    Ischemic stroke ([place])    07/19/2004   Lumbar radiculopathy    Osteoporosis    RLS (restless legs syndrome)    Spinal stenosis    TMJ (temporomandibular joint disorder)    Trochanteric bursitis of both hips    Vascular malformation    spinal cord   Past Surgical History:  Procedure Laterality Date   APPENDECTOMY     CARPAL TUNNEL RELEASE Right 12/13/2017   surgery   CHOLECYSTECTOMY     COLONOSCOPY  05/06/2017   Colonic polyps status post polypectomy. Interan land external hemorrhoids.    ESOPHAGOGASTRODUODENOSCOPY  04/28/2007   Mild gastrtiis. Otherwise normal EGD.    HEMORROIDECTOMY     LAMINECTOMY AND MICRODISCECTOMY LUMBAR SPINE  01/03/2005   NECK SURGERY     x2    OTHER SURGICAL HISTORY  04/24/2004   anterior cervical discectomy and fusion at C5-C6   TONSILLECTOMY     TOTAL  ABDOMINAL HYSTERECTOMY     Family History  Problem Relation Age of Onset   Supraventricular tachycardia Mother    Dementia Mother    Hyperlipidemia Mother    Hypertension Mother    Osteoarthritis Mother    Osteoporosis Mother    Breast cancer Other    Breast cancer Other    Colon cancer Neg Hx    Esophageal cancer Neg Hx    Social History   Socioeconomic History   Marital status: Divorced    Spouse name: Rex (Ex-Husband/son's father)   Number of children: 1  Occupational History   Occupation: Disabled  Tobacco Use   Smoking status: Every Day    Packs/day: 1.00    Types: Cigarettes  Smokeless tobacco: Never  Vaping Use   Vaping Use: Never used  Substance and Sexual Activity   Alcohol use: Not Currently    Comment: quit 19 + years ago   Drug use: Never   Sexual activity: Not Currently  Social History Narrative   Disabled son lives with her sometimes and his father sometimes.  Kaniya is working hard to have a relationship with her son's father and his wife.  They help her when needed.   Social Determinants of Health   Financial Resource Strain: Not on file  Food Insecurity: Not on file  Transportation Needs: No Transportation Needs   Lack of Transportation (Medical): No   Lack of Transportation (Non-Medical): No  Physical Activity: Not on file  Stress: Not on file  Social Connections: Not on file    Tobacco Counseling Ready to quit: No Counseling given: Yes, verbally counseled in ways to cut down and quit including health benefits of quitting   Clinical Intake:  Pre-visit preparation completed: Yes  Pain : 0-10 Pain Score: 8  Pain Type: Chronic pain Pain Location: Back Pain Orientation: Lower Pain Descriptors / Indicators: Constant, Aching, Shooting Pain Frequency: Constant Pain Relieving Factors: Gabapentin, Percocet, Flexeril Effect of Pain on Daily Activities: Moderate Pain Relieving Factors: Gabapentin, Percocet, Flexeril BMI - recorded:  27.7 Nutritional Status: BMI 25 -29 Overweight Nutritional Risks: None Diabetes: No   Interpreter Needed?: No   Activities of Daily Living In your present state of health, do you have any difficulty performing the following activities: 02/06/2021 07/04/2020  Hearing? N N  Vision? N N  Difficulty concentrating or making decisions? N N  Walking or climbing stairs? Y N  Dressing or bathing? N N  Doing errands, shopping? N N  Preparing Food and eating ? N -  Using the Toilet? N -  In the past six months, have you accidently leaked urine? N -  Do you have problems with loss of bowel control? N -  Managing your Medications? N -  Managing your Finances? N -  Housekeeping or managing your Housekeeping? Y -  Comment problems due to pain -  Some recent data might be hidden    Patient Care Team: [name], MD as PCP - General (Family Medicine) Revankar, Reita Cliche, MD as PCP - Cardiology (Cardiology) Jackquline Denmark, MD as PCP - Gastroenterology (Gastroenterology) Eustace Moore, MD as Consulting Physician (Neurosurgery) Landis Martins, DPM as Consulting Physician (Podiatry) Burnice Logan, [facility] as Pharmacist (Pharmacist) Joie Bimler, MD as Consulting Physician (Urology) Marice Potter, MD as Consulting Physician (Oncology)     Assessment:   This is a routine wellness examination for Doran.  Dietary issues and exercise activities discussed: Current Exercise Habits: The patient does not participate in regular exercise at present, Exercise limited by: orthopedic condition(s)   Goals Addressed             This Visit's Progress    Manage My Medicine   On track    Timeframe:  [place] Goal Priority:  High Start Date:                             Expected End Date:                       Follow Up Date 03/2021    - call for medicine refill 2 or 3 days before it runs out - keep a list of  all the medicines I take; vitamins and herbals too - use a pillbox to sort medicine     Why is this important?   These steps will help you keep on track with your medicines.   Notes:      Prevent Falls and Broken Bones-Osteoporosis   On track    Timeframe:  [place] Goal Priority:  High Start Date:                             Expected End Date:                       Follow Up Date 03/2021    - always use handrails on the stairs - get at least 10 minutes of activity every day - pick up clutter from the floors    Why is this important?   When you fall, there are 3 things that control if a bone breaks or not.  These are the fall itself, how hard and the direction that you fall and how fragile your bones are.  Preventing falls is very important for you because of fragile bones.     Notes:        Depression Screen PHQ 2/9 Scores 02/06/2021 02/01/2021 10/31/2020 10/28/2020 10/06/2020 04/26/2020 01/17/2020  PHQ - 2 Score '1 1 2 2 3 '$ 0 4  PHQ- 9 Score '5 4 10 10 '$ - 5 15    Fall Risk Fall Risk  02/06/2021 02/01/2021 10/28/2020 07/26/2020 06/08/2020  Falls in the past year? 1 0 '1 1 1  '$ Number falls in past yr: 0 0 1 0 1  Injury with Fall? 0 0 0 1 1  Risk for fall due to : Impaired mobility;History of fall(s);Orthopedic patient;Medication side effect - - Other (Comment) -  Risk for fall due to: Comment - - - patient fell and borke foot -  Follow up Falls evaluation completed;Education provided;Falls prevention discussed Falls evaluation completed - Falls evaluation completed Falls evaluation completed    FALL RISK PREVENTION PERTAINING TO THE HOME:  Any stairs in or around the home? Yes  Patient does not use the steps - she has a ramp however is uneven and poses a potential for tripping If so, are there any without handrails? No  Home free of loose throw rugs in walkways, pet beds, electrical cords, etc? No  rugs throughout the house Adequate lighting in your home to reduce risk of falls? Yes   ASSISTIVE DEVICES UTILIZED TO PREVENT FALLS:  Life alert? No  Use of a cane, walker or  w/c? Yes  Grab bars in the bathroom? No  Shower chair or bench in shower? No  Elevated toilet seat or a handicapped toilet? No    Gait slow and steady with assistive device  Cognitive Function:     6CIT Screen 02/06/2021 06/30/2019  What Year? 0 points 0 points  What month? 0 points 0 points  What time? 0 points 0 points  Count back from 20 0 points 0 points  Months in reverse 0 points 0 points  Repeat phrase 2 points 0 points  Total Score 2 0    Immunizations Immunization History  Administered Date(s) Administered   Fluad Quad(high Dose 65+) 02/01/2021   Influenza-Unspecified 01/26/2018, 02/06/2019, 02/05/2020   PFIZER(Purple Top)SARS-COV-2 Vaccination 01/30/2020, 04/28/2020   Pneumococcal Conjugate-13 12/16/2014   Pneumococcal Polysaccharide-23 03/26/2013   Td 03/13/2015    TDAP status: Up to date  Flu Vaccine status: Up to date  Pneumococcal vaccine status: Completed during today's visit.  Covid-19 vaccine status: Information provided on how to obtain vaccines.  Booster is due  Qualifies for Shingles Vaccine? Yes   Zostavax completed Yes   Shingrix Completed?: No.    Education has been provided regarding the importance of this vaccine. Patient has been advised to call insurance company to determine out of pocket expense if they have not yet received this vaccine. Advised may also receive vaccine at local pharmacy or Health Dept. Verbalized acceptance and understanding.  Screening Tests Health Maintenance  Topic Date Due   Hepatitis C Screening  Never done   Zoster Vaccines- Shingrix (1 of 2) Never done   PNA vac Low Risk Adult (2 of 2 - PPSV23) 05/05/2019   COVID-19 Vaccine (3 - Pfizer risk series) 05/26/2020   MAMMOGRAM  04/06/2022   TETANUS/TDAP  03/12/2025   COLONOSCOPY (Pts 45-70yr Insurance coverage will need to be confirmed)  06/03/2029   INFLUENZA VACCINE  Completed   DEXA SCAN  Completed   HPV VACCINES  Aged Out    Health Maintenance  Health  Maintenance Due  Topic Date Due   Hepatitis C Screening  Never done   Zoster Vaccines- Shingrix (1 of 2) Never done   PNA vac Low Risk Adult (2 of 2 - PPSV23) 05/05/2019   COVID-19 Vaccine (3 - Pfizer risk series) 05/26/2020    Colorectal cancer screening: Type of screening: Colonoscopy. Completed 06/04/19. Repeat every 10 years  Mammogram status: Completed 03/2020. Repeat every year  Bone Density status: Completed 03/2020.  Lung Cancer Screening: (Low Dose CT Chest recommended if Age 67-80years, 30 pack-year currently smoking OR have quit w/in 15years.) does qualify.   Lung Cancer Screening Referral: patient declined at this time  Additional Screening:  Vision Screening: Recommended annual ophthalmology exams for early detection of glaucoma and other disorders of the eye. Is the patient up to date with their annual eye exam?  Yes   Dental Screening: Recommended annual dental exams for proper oral hygiene     Plan:    1- [facility] [place] [facility]- Shingrix Vaccine recommended to get at pharmacy 4- PNA Vaccine - completed today with Pneumovax 23 5- Advance Directive on file - Reviewed, no changes  6- Smoking Cessation - discussed at length the health risks that come with smoking as well as different ways to quit.  Patient is smoking half a pack less a day than she was a year ago. 7- Chair exercises recommended as tolerated by her back 8- Fall prevention discussed and patient educated  I have personally reviewed and noted the following in the patient's chart:   Medical and social history Use of alcohol, tobacco or illicit drugs  Current medications and supplements including opioid prescriptions.  Functional ability and status Nutritional status Physical activity Advanced directives List of other physicians Hospitalizations, surgeries, and ER visits in previous 12 months Vitals Screenings to include  cognitive, depression, and falls Referrals and appointments  In addition, I have reviewed and discussed with patient certain preventive protocols, quality metrics, and best practice recommendations. A written personalized care plan for preventive services as well as general preventive health recommendations were provided to patient.     KErie Noe LPN   [id number]

## 2021-02-08 ENCOUNTER — Encounter: Payer: Self-pay | Admitting: Family Medicine

## 2021-02-08 NOTE — Assessment & Plan Note (Signed)
The current medical regimen is effective;  continue present plan and medications.  

## 2021-02-08 NOTE — Assessment & Plan Note (Signed)
Management per specialist The current medical regimen is effective;  continue present plan and medications.  

## 2021-02-08 NOTE — Assessment & Plan Note (Signed)
Management per specialist. 

## 2021-02-08 NOTE — Assessment & Plan Note (Signed)
Well controlled.  ?No changes to medicines.  ?Continue to work on eating a healthy diet and exercise.  ?Labs drawn today.  ?

## 2021-02-08 NOTE — Assessment & Plan Note (Signed)
The current medical regimen is effective;  continue present plan and medications. Management per specialist.   

## 2021-02-13 ENCOUNTER — Telehealth: Payer: Self-pay | Admitting: Family Medicine

## 2021-02-13 NOTE — Telephone Encounter (Signed)
   Connie Coffey has been scheduled for the following appointment:  WHAT: SCREENING MAMMOGRAM WHERE: [facility] DATE: 04/07/21 TIME: 12:45 PM ARRIVAL TIME  Patient has been made aware.

## 2021-02-22 ENCOUNTER — Telehealth: Payer: Self-pay

## 2021-02-22 NOTE — Chronic Care Management (AMB) (Signed)
Chronic Care Management Pharmacy Assistant   Name: Connie Coffey  MRN: [id number] DOB: 03-Mar-1956   Reason for Encounter: General Adherence Call    Recent office visits:  02/06/21 Rhae Hammock LPN. Seen for AWV.  Ordered mammogram. No med changes. Follow up in 1 year.  02/01/21 [name] MD. Seen for HLD, HTN and DM. D/C Lidocaine 1.8% patch due to ineffective. Started Diclofenac Sodium 20g topical 4 times daily. Follow up in 3 months.  Recent consult visits:  None since 11/30/20  Hospital visits:  None in previous 6 months  Medications: Outpatient Encounter Medications as of 02/22/2021  Medication Sig   Abaloparatide (TYMLOS) 3120 MCG/1.56ML SOPN Inject 80 mcg into the skin daily.   albuterol (VENTOLIN HFA) 108 (90 Base) MCG/ACT inhaler Inhale 2 puffs into the lungs every 4 (four) hours as needed.   atorvastatin (LIPITOR) 40 MG tablet TAKE 1 TABLET BY MOUTH  DAILY   bethanechol (URECHOLINE) 25 MG tablet Take 1 tablet (25 mg total) by mouth in the morning and at bedtime.   busPIRone (BUSPAR) 30 MG tablet Take 1 tablet (30 mg total) by mouth 2 (two) times daily.   clonazePAM (KLONOPIN) 0.5 MG tablet TAKE 1 TABLET BY MOUTH TWICE A DAY   cyclobenzaprine (FLEXERIL) 10 MG tablet Take 1 tablet (10 mg total) by mouth 3 (three) times daily.   D-Mannose 500 MG CAPS Take by mouth.   diclofenac Sodium (VOLTAREN) 1 % GEL Apply 20 g topically 4 (four) times daily.   DULoxetine (CYMBALTA) 60 MG capsule Take 1 capsule (60 mg total) by mouth daily.   DYMISTA 137-50 MCG/ACT SUSP One spray each nostril twice a day.   fenofibrate 160 MG tablet Take 1 tablet (160 mg total) by mouth daily.   gabapentin (NEURONTIN) 600 MG tablet Take 1 tablet (600 mg total) by mouth 4 (four) times daily.   hydrocortisone (CORTEF) 20 MG tablet Take 1.5 tablets (30 mg total) by mouth daily. Take 1 tablet in the morning and 1/2 tablet at 4pm   Insulin Pen Needle (GLOBAL EASY GLIDE PEN NEEDLES) 32G X 4 MM MISC Use one  needle daily as directed   levocetirizine (XYZAL) 5 MG tablet TAKE 1 TABLET BY MOUTH EVERY DAY IN THE EVENING   levothyroxine (SYNTHROID) 50 MCG tablet TAKE 1 TABLET BY MOUTH IN  THE MORNING   lubiprostone (AMITIZA) 8 MCG capsule Take 1 capsule (8 mcg total) by mouth 2 (two) times daily with a meal.   montelukast (SINGULAIR) 10 MG tablet Take 1 tablet (10 mg total) by mouth daily.   Multiple Vitamin (MULTI-VITAMINS) TABS Take 1 tablet by mouth daily.   naloxone (NARCAN) nasal spray 4 mg/0.1 mL Place 1 spray into the nose once. (for suspected overdose)   omeprazole (PRILOSEC) 40 MG capsule Take 1 capsule (40 mg total) by mouth 2 (two) times daily.   oxyCODONE-acetaminophen (PERCOCET) 10-325 MG tablet TAKE 1 TAB EVERY 4 HOURS FOR 5 DAYS THEN 1 TAB TWICE A DAY AS NEEDED FOR SEVERE BREAKTHROUGH PAIN   primidone (MYSOLINE) 50 MG tablet Take 1 tablet (50 mg total) by mouth 2 (two) times daily.   Tiotropium Bromide Monohydrate (SPIRIVA RESPIMAT) 2.5 MCG/ACT AERS Inhale 1 puff into the lungs 2 (two) times daily.   Vitamin D, Ergocalciferol, (DRISDOL) 50000 units CAPS capsule Take 1 capsule by mouth once a week.   No facility-administered encounter medications on file as of 02/22/2021.   Contacted Genae G [facility] for general disease state and medication  adherence call.   Patient is not > 5 days past due for refill on the following medications per chart history:  [facility] Medications: Medication Name/mg Last Fill Days Supply Atorvastatin 40 mg  12/02/20  90ds    What concerns do you have about your medications? Pt stated she is not having any issues   The patient stated she has to take Tymlos shots and woke up one morning and couldn't move her arms but Dr. Tobie Poet stated that is normal and should resolve .  How often do you forget or accidentally miss a dose? Never  Do you use a pillbox? Yes  Are you having any problems getting your medications from your pharmacy? Pt stated she is not having issues   Has  the cost of your medications been a concern?  Pt stated insurance has been good at paying for medication. She stated she only pays $4-9 for her medications and mostly does not have to pay anything.  Since last visit with CPP, no interventions have been made:   The patient has not had an ED visit since last contact.   The patient reports the following problems with their health. She fell recently and fell on her back and is in pain. Pt reports right side is hurting her. Pt is waiting on her next Dexa scan so she can have surgery on her back because she has DDD and Osteoporosis.  Pt is taking Tymlos shots to help improve her bones.  Pt states her left foot is numb and causes her to fall sometimes. She stated she uses a walker but needs to see podiatry. She stated she has broken it a few times. She stated she can't do anything she wants to do and it hinders her to go out and do things outside of the house.She stated she needs her power wheel chair but hers is broken and needs a new one. She is calling around to find prices on one.     Care Gaps: Last annual wellness visit? Done on 5/79/03 If applicable:N/A Last eye exam / retinopathy screening? Diabetic foot exam?   [place]

## 2021-03-09 DIAGNOSIS — N390 Urinary tract infection, site not specified: Secondary | ICD-10-CM | POA: Diagnosis not present

## 2021-03-26 ENCOUNTER — Other Ambulatory Visit: Payer: Self-pay | Admitting: Family Medicine

## 2021-03-27 ENCOUNTER — Other Ambulatory Visit: Payer: Self-pay | Admitting: Family Medicine

## 2021-03-27 ENCOUNTER — Other Ambulatory Visit: Payer: Self-pay

## 2021-03-27 MED ORDER — LUBIPROSTONE 8 MCG PO CAPS: 8.0000 ug | ORAL_CAPSULE | Freq: Two times a day (BID) | ORAL | 1 refills | Status: DC

## 2021-04-03 ENCOUNTER — Other Ambulatory Visit: Payer: Self-pay | Admitting: Legal Medicine

## 2021-04-07 DIAGNOSIS — M810 Age-related osteoporosis without current pathological fracture: Secondary | ICD-10-CM | POA: Diagnosis not present

## 2021-04-07 DIAGNOSIS — R636 Underweight: Secondary | ICD-10-CM | POA: Diagnosis not present

## 2021-04-07 DIAGNOSIS — Z1231 Encounter for screening mammogram for malignant neoplasm of breast: Secondary | ICD-10-CM | POA: Diagnosis not present

## 2021-04-07 DIAGNOSIS — M85852 Other specified disorders of bone density and structure, left thigh: Secondary | ICD-10-CM | POA: Diagnosis not present

## 2021-04-07 LAB — MM 3D SCREENING MAMMOGRAM BILATERAL BREAST

## 2021-04-07 LAB — HM DEXA SCAN

## 2021-04-10 ENCOUNTER — Telehealth: Payer: Self-pay

## 2021-04-10 ENCOUNTER — Other Ambulatory Visit: Payer: Self-pay

## 2021-04-10 DIAGNOSIS — Z1231 Encounter for screening mammogram for malignant neoplasm of breast: Secondary | ICD-10-CM

## 2021-04-10 NOTE — Telephone Encounter (Signed)
LM for patient to let her know that her mammogram was normal and that a screening mammogram is recommended in one year.

## 2021-04-11 ENCOUNTER — Telehealth: Payer: Self-pay

## 2021-04-11 NOTE — Progress Notes (Signed)
Pt has confirmed telephone appt with CPP tomorrow at [place], [place] Pharmacist Assistant  [phone]

## 2021-04-12 ENCOUNTER — Ambulatory Visit (INDEPENDENT_AMBULATORY_CARE_PROVIDER_SITE_OTHER): Payer: Medicare Other

## 2021-04-12 NOTE — Progress Notes (Signed)
Chronic Care Management Pharmacy Note  04/12/2021 Name:  Connie Coffey MRN:  [id number] DOB:  05/16/1956   Plan Updates:  No updated, patient just needed someone to listen about her struggles  Subjective: Connie Coffey is an 65 y.o. year old female who is a primary patient of Cox, Kirsten, MD.  The CCM team was consulted for assistance with disease management and care coordination needs.    Engaged with patient by telephone for follow up visit in response to provider referral for pharmacy case management and/or care coordination services.   Consent to Services:  The patient was given the following information about Chronic Care Management services today, agreed to services, and gave verbal consent: 1. CCM service includes personalized support from designated clinical staff supervised by the primary care provider, including individualized plan of care and coordination with other care providers 2. 24/7 contact phone numbers for assistance for urgent and routine care needs. 3. Service will only be billed when office clinical staff spend 20 minutes or more in a month to coordinate care. 4. Only one practitioner may furnish and bill the service in a calendar month. 5.The patient may stop CCM services at any time (effective at the end of the month) by phone call to the office staff. 6. The patient will be responsible for cost sharing (co-pay) of up to 20% of the service fee (after annual deductible is met). Patient agreed to services and consent obtained.  Patient Care Team: [name], MD as PCP - General (Family Medicine) Revankar, Reita Cliche, MD as PCP - Cardiology (Cardiology) Jackquline Denmark, MD as PCP - Gastroenterology (Gastroenterology) Eustace Moore, MD as Consulting Physician (Neurosurgery) Landis Martins, DPM as Consulting Physician (Podiatry) Burnice Logan, [facility] (Inactive) as Pharmacist (Pharmacist) Joie Bimler, MD as Consulting Physician (Urology) Marice Potter, MD as Consulting  Physician (Oncology)  Recent office visits:  09/07/20-Labs-CBC: WBC trending down 14.1 from 17.2 week ago, Platelets also decreased but remain elevated 614 down from 802. Will discuss hematology referral.    08/31/20- Office visit [name] NP for Gastroenteritis. Labs and urine culture  performed. Started Ondansetron 27m ODT 1 po q 8hr prn nausea.  UKoreabreast ordered. Started Levofloxacin 5059m1 po qd x 7 days. Patient advised to rest and push fluids and eat a bland diet.    07/26/20- Dr KrDirk DressPCP) for depression and hyperlipidemia.  Labs ordered.   Referral to Urology. Discontinued Lidocaine patch 5%, ciprofloxacin 50057mand trazodone 100m34mPatient advised to followup with endocrinology for Adrenal insufficiency.    07/04/20- Dr KrisDirk DressP)- for acute cystitis with hematuria.  Started Bactrim DS 800-160mg69mo bid.  Discontinued Cefdinir 300mg 25mLevofloacin 250mg. 25mne culture performed.    06/08/20- Dr KristenDirk Dress for acute cystitis with hematuria.  Started Levaquin 250mg 1 69m 3 days and increased strength of Phenazopyridine HCL 100mg tid59mPhenazopyridine /HCL 200mg tid.61mscontinued Bactrim DS 800-160mg 1 po 72m Urine culture performed.    05/30/20- Shannon HeaJerrell BelfastUrine culture performed. Started Phenazopyridine HCL 100mg tid an59mctrim DS 800-160mg 1 bid. 89m2/2/21- Dr Kristen Cox (Dirk Dress vaccine   04/26/20- Dr Kristen Cox (Dirk Dressordered and referrral to Podiatry.  Discontinued Breztri 160-9-4.8 MCG.  Prolia pending approval, not started.      Recent consult visits:    09/22/20-Dr Dequincy LewiLavera GuiseNo medication changes.   09/20/20- [facility]- results of Lumbar MRI  09/14/20- [facility]- followup right side steroid injection.   08/03/20- Landis Martins DPM (Podiatry)- closed displaced fracture of cuboid of right foot with routine healing.  Patient reported recent back cyst drainage with injection. Right  foot x ray performed. Patient advised that she may slowly wean from ankle gauntlet brace however on [place] walks or stand may want to occasionally use it for extra protection and stability.  Patient advised to continue with follow-up with back doctor like previous.  Patient advised to continue with cane to provide additional stability in gait or walker like previous.  Patient to return prn.     06/22/20- Landis Martins DPM (podiaty)- closed displaced fracture of cuboid of right foot with routine healing. Right foot x ray performed. Patient reports upcoming MRI of back  and possible back surgery. Continue with ankle gauntlet area fracture cuboid that is slowly healing.  Patient is not a candidate for immobilization cam boot or surgical shoe due to significant sciatica and gait disturbance.  Patient advised to continue with follow-up with back doctor Patient advised to continue with cane to provide additional stability in gait or walker like previously recommended to prevent against fallsPatient to return to office x-ray of right foot to check cuboid fracture however it is healing very well at this time.   06/07/20-[facility]- to discuss lower back and right posterior thigh pain. No medication changes.                05/11/20- Landis Martins DPM (Podiatry)- for Closed displaced fracture of cuboid of right foot.  Right foot x ray performed. Encounter noted applied ankle gauntlet for patient to use to keep pressure off the peroneal tendon course and the lateral foot at area of fracture, Due to patient low back pain sciatic issues patient is not a candidate for a boot or surgical shoe.  Patient was recommend protection, rest, ice, elevation daily until symptoms improve and to continue with over-the-counter pain medicines as needed.  Patient advised to continue with nerve medicines of gabapentin and Cymbalta as previous prescribed and to continue with cane to provide additional stability in gait or walker.   Patient to return to office in 4 to 6 weeks for serial x-rays to assess healing  or sooner if condition worsens.   Hospital visits: None in previous 6 months  Objective:  Lab Results  Component Value Date   CREATININE 0.78 02/01/2021   BUN 12 02/01/2021   GFRNONAA 79 04/26/2020   GFRAA 91 04/26/2020   NA 141 02/01/2021   K 4.7 02/01/2021   CALCIUM 10.0 02/01/2021   CO2 25 02/01/2021   GLUCOSE 97 02/01/2021    No results found for: HGBA1C, FRUCTOSAMINE, GFR, MICROALBUR  Last diabetic Eye exam: No results found for: HMDIABEYEEXA  Last diabetic Foot exam: No results found for: HMDIABFOOTEX   Lab Results  Component Value Date   CHOL 166 02/01/2021   HDL 79 02/01/2021   LDLCALC 70 02/01/2021   TRIG 96 02/01/2021   CHOLHDL 2.1 02/01/2021    Hepatic Function Latest Ref Rng & Units 02/01/2021 08/31/2020 07/26/2020  Total Protein 6.0 - 8.5 g/dL 6.2 6.0 6.5  Albumin 3.8 - 4.8 g/dL 4.3 3.9 4.4  AST 0 - 40 IU/L '14 19 18  ' ALT 0 - 32 IU/L '11 22 19  ' Alk Phosphatase 44 - 121 IU/L 42(L) 52 37(L)  Total Bilirubin 0.0 - 1.2 mg/dL <0.2 0.2 0.2    Lab Results  Component Value Date/Time   TSH 1.550  02/01/2021 10:40 AM   TSH 0.335 (L) 07/26/2020 11:17 AM   FREET4 1.33 07/26/2020 11:17 AM    CBC Latest Ref Rng & Units 02/01/2021 12/01/2020 10/28/2020  WBC 3.4 - 10.8 x10E3/uL 14.2(H) 13.4(H) 10.3  Hemoglobin 11.1 - 15.9 g/dL 13.6 14.0 13.7  Hematocrit 34.0 - 46.6 % 42.0 42.4 41.8  Platelets 150 - 450 x10E3/uL 500(H) 472(H) 497(H)    Lab Results  Component Value Date/Time   VD25OH 55.0 07/26/2020 11:17 AM    Clinical ASCVD: No  The 10-year ASCVD risk score (Arnett DK, et al., 2019) is: 10.1%   Values used to calculate the score:     Age: 42 years     Sex: Female     Is Non-Hispanic African American: No     Diabetic: No     Tobacco smoker: Yes     Systolic Blood Pressure: 716 mmHg     Is BP treated: No     HDL Cholesterol: 79 mg/dL     Total Cholesterol: 166 mg/dL    Depression  screen [facility] 2/9 02/06/2021 02/01/2021 10/31/2020  Decreased Interest 0 0 1  Down, Depressed, Hopeless '1 1 1  ' PHQ - 2 Score '1 1 2  ' Altered sleeping '1 1 2  ' Tired, decreased energy '1 1 2  ' Change in appetite '2 1 1  ' Feeling bad or failure about yourself  0 0 1  Trouble concentrating 0 0 1  Moving slowly or fidgety/restless 0 0 1  Suicidal thoughts 0 0 0  PHQ-9 Score '5 4 10  ' Difficult doing work/chores Somewhat difficult Somewhat difficult -  Some recent data might be hidden     Social History   Tobacco Use  Smoking Status Every Day   Packs/day: 1.00   Types: Cigarettes  Smokeless Tobacco Never   BP Readings from Last 3 Encounters:  02/06/21 138/64  02/01/21 110/60  11/24/20 (!) 148/74   Pulse Readings from Last 3 Encounters:  02/06/21 95  02/01/21 96  11/24/20 (!) 102   Wt Readings from Last 3 Encounters:  02/06/21 146 lb 9.6 oz (66.5 kg)  02/01/21 142 lb 9.6 oz (64.7 kg)  11/24/20 144 lb 12.8 oz (65.7 kg)   BMI Readings from Last 3 Encounters:  02/06/21 27.70 kg/m  02/01/21 26.94 kg/m  11/24/20 27.36 kg/m    Assessment/Interventions: Review of patient past medical history, allergies, medications, health status, including review of consultants reports, laboratory and other test data, was performed as part of comprehensive evaluation and provision of chronic care management services.   SDOH:  (Social Determinants of Health) assessments and interventions performed: Yes SDOH Interventions    Flowsheet Row Most Recent Value  SDOH Interventions   Transportation Interventions Patient Refused  [Due to pain and numbness in feet, patient has trouble driving. Especially to pain appt's]      SDOH Screenings   Alcohol Screen: Not on file  Depression (PHQ2-9): Medium Risk   PHQ-2 Score: 5  Financial Resource Strain: Not on file  Food Insecurity: Not on file  Housing: [place] Risk Score: 0  Physical Activity: Not on file  Social Connections: Not on file   Stress: Not on file  Tobacco Use: High Risk   Smoking Tobacco Use: Every Day   Smokeless Tobacco Use: Never   Passive Exposure: Not on file  Transportation Needs: Unmet Transportation Needs   Lack of Transportation (Medical): Yes   Lack of Transportation (Non-Medical): Yes    CCM Care  Plan  Allergies  Allergen Reactions   Beta Vulgaris    Celebrex [Celecoxib]     "because of stomach ulcers"   Chantix [Varenicline]    Dog Epithelium Allergy Skin Test    Fish Oil Other (See Comments)    hemorrhoids   Gramineae Pollens    Molds & Smuts    Nsaids Other (See Comments)    GI Upset. Ulcers   Other     beets   Nicotrol [Nicotine] Nausea Only   Penicillins Rash    Medications Reviewed Today     Reviewed by [name], MD (Physician) on 02/08/21 at [place] List Status: <None>   Medication Order Taking? Sig Documenting Provider Last Dose Status Informant  Abaloparatide (TYMLOS) 3120 MCG/1.56ML SOPN [id number] No Inject 80 mcg into the skin daily. Lillard Anes, MD Taking Active   albuterol (VENTOLIN HFA) 108 [phone] Base) MCG/ACT inhaler [id number] No Inhale 2 puffs into the lungs every 4 (four) hours as needed. Lillard Anes, MD Taking Active   atorvastatin (LIPITOR) 40 MG tablet [id number] No TAKE 1 TABLET BY MOUTH  DAILY Cox, Kirsten, MD Taking Active   bethanechol (URECHOLINE) 25 MG tablet [id number] No Take 1 tablet (25 mg total) by mouth in the morning and at bedtime. Lillard Anes, MD Taking Active   busPIRone (BUSPAR) 30 MG tablet [id number] No Take 1 tablet (30 mg total) by mouth 2 (two) times daily. Lillard Anes, MD Taking Active   clonazePAM Bobbye [place]) 0.5 MG tablet [id number] No TAKE 1 TABLET BY MOUTH TWICE A DAY Cox, Kirsten, MD Taking Active   cyclobenzaprine (FLEXERIL) 10 MG tablet [id number] No Take 1 tablet (10 mg total) by mouth 3 (three) times daily. Lillard Anes, MD Taking Active   D-Mannose 500 MG CAPS [id number] No Take  by mouth. [provider] Taking Active   diclofenac Sodium (VOLTAREN) 1 % GEL [id number] No Apply 20 g topically 4 (four) times daily. Rip Harbour, NP Taking Active   DULoxetine (CYMBALTA) 60 MG capsule [id number] No Take 1 capsule (60 mg total) by mouth daily. Lillard Anes, MD Taking Active   DYMISTA 137-50 MCG/ACT Elizbeth Squires [id number] No One spray each nostril twice a day. Lillard Anes, MD Taking Active   fenofibrate 160 MG tablet [id number] No Take 1 tablet (160 mg total) by mouth daily. Lillard Anes, MD Taking Active   gabapentin (NEURONTIN) 600 MG tablet [id number] No Take 1 tablet (600 mg total) by mouth 4 (four) times daily. Lillard Anes, MD Taking Active   hydrocortisone (CORTEF) 20 MG tablet [id number] No Take 1.5 tablets (30 mg total) by mouth daily. Take 1 tablet in the morning and 1/2 tablet at 4pm Henrene Pastor, Zeb Comfort, MD Taking Active   Insulin Pen Needle (GLOBAL EASY GLIDE PEN NEEDLES) 32G X 4 MM MISC [id number] No Use one needle daily as directed [provider] Taking Active   levocetirizine (XYZAL) 5 MG tablet [id number] No TAKE 1 TABLET BY MOUTH EVERY DAY IN THE Newell Coral, MD Taking Active   levothyroxine (SYNTHROID) 50 MCG tablet [id number] No TAKE 1 TABLET BY MOUTH IN  THE Darcus Austin, MD Taking Active   lubiprostone (AMITIZA) 8 MCG capsule [id number] No Take 1 capsule (8 mcg total) by mouth 2 (two) times daily with a meal. Lillard Anes, MD Taking Active   montelukast (SINGULAIR) 10 MG tablet [id number] No Take 1 tablet (10 mg total) by mouth daily. Reinaldo Meeker  Percell Miller, MD Taking Active   Multiple Vitamin (MULTI-VITAMINS) TABS [id number] No Take 1 tablet by mouth daily. [provider] Taking Active   naloxone [facility]) nasal spray 4 mg/0.1 mL [id number] No Place 1 spray into the nose once. (for suspected overdose) [provider] Taking Active   omeprazole (PRILOSEC)  40 MG capsule [id number] No Take 1 capsule (40 mg total) by mouth 2 (two) times daily. Lillard Anes, MD Taking Active   oxyCODONE-acetaminophen (PERCOCET) 10-325 MG tablet [id number] No TAKE 1 TAB EVERY 4 HOURS FOR 5 DAYS THEN 1 TAB TWICE A DAY AS NEEDED FOR SEVERE BREAKTHROUGH PAIN Rolena Infante, Jacinto Reap, MD Taking Active   primidone (MYSOLINE) 50 MG tablet [id number] No Take 1 tablet (50 mg total) by mouth 2 (two) times daily. Lillard Anes, MD Taking Active   Tiotropium Bromide Monohydrate (SPIRIVA RESPIMAT) 2.5 MCG/ACT AERS [id number] No Inhale 1 puff into the lungs 2 (two) times daily. Lillard Anes, MD Taking Active   Vitamin D, Ergocalciferol, (DRISDOL) 50000 units CAPS capsule [phone] No Take 1 capsule by mouth once a week. Riccardo Dubin, PA-C Taking Active             Patient Active Problem List   Diagnosis Date Noted   Absence of bladder continence 60/63/0160   Uncomplicated opioid dependence ([place]) 02/01/2021   Mild recurrent major depression ([place]) 02/01/2021   Greater trochanteric pain syndrome 11/06/2020   Leukocytosis 10/10/2020   Elevated platelet count 10/10/2020   Trochanteric bursitis, right hip 09/20/2020   Status post cervical spinal fusion 09/14/2020   Simple chronic bronchitis ([place]) 02/16/2020   Mixed hyperlipidemia 10/19/2019   Adrenal insufficiency (Addison's disease) ([place]) 10/19/2019   Sciatica of right side associated with disorder of lumbar spine 07/18/2019   History of colonic polyps 02/16/2019   Thoracic degenerative disc disease 02/09/2019   Lung nodules 07/18/2018   Cigarette smoker 03/11/2018   Chronic pain syndrome 04/15/2017   Depression with anxiety 03/21/2017   Age-related osteoporosis without current pathological fracture 01/18/2017   Acquired hypothyroidism 09/22/2015   Hyperparathyroidism ([place]) 09/22/2015   Vitamin D deficiency 09/22/2015   Anxiety state 08/06/2013   Lumbar post-laminectomy syndrome 03/19/2013    Neck pain 01/21/2013   Low back pain 11/21/2012    Immunization History  Administered Date(s) Administered   Fluad Quad(high Dose 65+) 02/01/2021   Influenza-Unspecified 01/26/2018, 02/06/2019, 02/05/2020   PFIZER(Purple Top)SARS-COV-2 Vaccination 01/30/2020, 04/28/2020   Pneumococcal Conjugate-13 12/16/2014   Pneumococcal Polysaccharide-23 03/26/2013, 02/06/2021   Td 03/13/2015    Conditions to be addressed/monitored:  Hypertension, Hyperlipidemia, Heart Failure, COPD, Hypothyroidism, Anxiety, Osteoporosis and Chronic Pain, Vitamin D Deficiency  Care Plan : CCM Pharmacy Care Plan  Updates made by Lane Hacker, [place] since 04/12/2021 12:00 AM     Problem: copd, htn, hld, osteoporosis   Priority: High  Onset Date: 10/06/2020     [place] Goal: Disease State Management   Start Date: 10/06/2020  Expected End Date: 10/06/2021  Recent Progress: On track  Priority: High  Note:    Current Barriers:  Unable to achieve control of osteoporosis   Pharmacist Clinical Goal(s):  Patient will achieve control of osteoporosis as evidenced by dexa scan through collaboration with PharmD and provider.   Interventions: 1:1 collaboration with [name], MD regarding development and update of comprehensive plan of care as evidenced by provider attestation and co-signature Inter-disciplinary care team collaboration (see longitudinal plan of care) Comprehensive medication review performed; medication list updated in electronic medical record  Hyperlipidemia: (  LDL goal < 55) -Not ideally controlled -Current treatment: atorvastatin 40 mg daily  Fenofibrate 160 mg daily  -Medications previously tried: none reported  -Current dietary patterns:  barbecue chicken, red potatoes, green beans, carrots for supper the other night. Fruit. Avoids fried foods.  -Current exercise habits: limited due to pinched nerve -Educated on Cholesterol goals;  Benefits of statin for ASCVD risk  reduction; Importance of limiting foods high in cholesterol; -Counseled on diet and exercise extensively Recommended to continue current medication  COPD (Goal: control symptoms and prevent exacerbations) -Controlled -Current treatment  albuterol inhaler 2 puffs every 4 hours prn - using 2-3 times weekly during allergy season Dymista 137-50 mcg/act 1 spray each nostril twice daily  Montelukast 10 mg daily  Spiriva 2.5 mcg/act 1 puff twice daily  -Medications previously tried:  Librarian, academic, cetirizine, Trelegy,  -Gold Grade: Gold 2 (FEV1 50-79%) -Pulmonary function testing: ratio 53%, FEV1 56%, FVC 84% -Exacerbations requiring treatment in last 6 months: none -Patient reports consistent use of maintenance inhaler -Frequency of rescue inhaler use: 2-3 times weekly during allergy season  -Counseled on Proper inhaler technique; Benefits of consistent maintenance inhaler use -Recommended to continue current medication  Depression/Anxiety (Goal: manage symptoms of depression) -Not ideally controlled -Current treatment: buspirone 30 mg bid  duloxetine 60 mg daily  Clonazepam 0.5 mg bid  -Medications previously tried/failed: paroxetine, trazodone  -PHQ9:  Depression screen [facility] 2/9 02/06/2021 02/01/2021 10/31/2020  Decreased Interest 0 0 1  Down, Depressed, Hopeless '1 1 1  ' PHQ - 2 Score '1 1 2  ' Altered sleeping '1 1 2  ' Tired, decreased energy '1 1 2  ' Change in appetite '2 1 1  ' Feeling bad or failure about yourself  0 0 1  Trouble concentrating 0 0 1  Moving slowly or fidgety/restless 0 0 1  Suicidal thoughts 0 0 0  PHQ-9 Score '5 4 10  ' Difficult doing work/chores Somewhat difficult Somewhat difficult -  Some recent data might be hidden   -Educated on Benefits of medication for symptom control -Recommended to continue current medication November 2022: Unable to conduct PHQ9 today, each time I started patient went on tangent about her house or son. Was unable to go over individual meds for the  same reason  Tobacco use (Goal reduce smoking) -Uncontrolled -Previous quit attempts: cold [place], chantix (made her sick), patches -Current treatment  None reported -Patient smokes Within 30 minutes of waking -Patient triggers include: stress and anxiety -On a scale of 1-10, reports MOTIVATION to quit is 1 -On a scale of 1-10, reports CONFIDENCE in quitting is 1 -Provided contact information for [place] Quit Line (1-800-QUIT-NOW) and encouraged patient to reach out to this group for support. -Counseled on importance of smoking cessation.   Osteoporosis / Osteopenia (Goal reduce risk of fracture ) -Managed by Peri Jefferson, Endo -Uncontrolled -Last DEXA Scan:  04/06/2020  T-Score femoral neck: -2.1   T-Score forearm radius:  - 3.9  -Patient is a candidate for pharmacologic treatment due to T-Score < -2.5 in femoral neck -Current treatment  alendronate 70 mg weekly  Vitamin D 50,000 units weekly  Tymlos -Medications previously tried:  [place] -Recommend [phone] units of vitamin D daily. Recommend 1200 mg of calcium daily from dietary and supplemental sources. Counseled on oral bisphosphonate administration: take in the morning, 30 minutes prior to food with 6-8 oz of water. Do not lie down for at least 30 minutes after taking. Recommend weight-bearing and muscle strengthening exercises for building and maintaining bone density.  -Counseled  on diet and exercise extensively Recommended to continue current medication Counseled on calcium supplementation  November 2022: Patient unsure of Alendronate start date but will defer to specialist  Chronic Pain (Goal: manage pain) -Not ideally controlled -Current treatment  Oxycodone-acetaminophen 10-325 mg every 6 hours then twice daily prn for severe breakthrough pain  Naloxone nasal spray 4 mg/0.1 ml 1 spray into the nose once Gabapentin 600 mg qid primidone 60 mg bid  Cyclobenzaprine 10 mg tid  voltaren 1% gel daily prn  Lidocaine patch  topically as needed -Medications previously tried: none reported  -Counseled on diet and exercise extensively Counseled on calcium intake and the importance while on alendronate.   GI (Goal: improve symptoms management) -Controlled -Current treatment  Omeprazole 40 mg bid Amitiza 8 mcg bid with a meal -Medications previously tried: ondansetron  -Recommended to continue current medication  Fibromyalgia (Goal: minimize flares/pain) -Managed by pain management -Controlled -Current treatment  duloxetine 60 mg daily -Medications previously tried: none reported  -Recommended to continue current medication  Hypothyroidism (Goal: TSH 2-3) -Not ideally controlled -Current treatment  levothyroxine 50 mcg daily in the morning  -Medications previously tried: none reported  -Recommended considering dose adjustment for goal TSH 2-3.      Patient Goals/Self-Care Activities Patient will:  - take medications as prescribed focus on medication adherence by using pill box  Follow Up Plan: Telephone follow up appointment with care management team member scheduled for: June 2023  [name], Pharm.D. - [phone]       Medication Assistance: None required.  Patient affirms current coverage meets needs.  Patient's preferred pharmacy is:  [employer] ([place], [place] [facility] [place] [address] [address] Phone: [phone] Fax: [phone]  [id number] [place] [place]: [phone]: [phone] [facility] [place] [address]Phone: [phone]: [phone] Uses pill box? Yes - has four times daily dosing in pill box Pt endorses good compliance  We discussed: Current pharmacy is preferred with insurance plan and  patient is satisfied with pharmacy services Patient decided to: Continue current medication management strategy  Care Plan and Follow Up Patient Decision:  Patient agrees to Care Plan and Follow-up.  Plan: Telephone follow up appointment with care management team member scheduled for:  June 2023  NArizona Constable PFloridaD. -- [phone]

## 2021-04-12 NOTE — Patient Instructions (Signed)
Visit Information   Goals Addressed   None    Patient Care Plan: CCM Pharmacy Care Plan     Problem Identified: copd, htn, hld, osteoporosis   Priority: High  Onset Date: 10/06/2020     [place] Goal: Disease State Management   Start Date: 10/06/2020  Expected End Date: 10/06/2021  Recent Progress: On track  Priority: High  Note:    Current Barriers:  Unable to achieve control of osteoporosis   Pharmacist Clinical Goal(s):  Patient will achieve control of osteoporosis as evidenced by dexa scan through collaboration with PharmD and provider.   Interventions: 1:1 collaboration with Cox, Kirsten, MD regarding development and update of comprehensive plan of care as evidenced by provider attestation and co-signature Inter-disciplinary care team collaboration (see longitudinal plan of care) Comprehensive medication review performed; medication list updated in electronic medical record  Hyperlipidemia: (LDL goal < 55) -Not ideally controlled -Current treatment: atorvastatin 40 mg daily  Fenofibrate 160 mg daily  -Medications previously tried: none reported  -Current dietary patterns:  barbecue chicken, red potatoes, green beans, carrots for supper the other night. Fruit. Avoids fried foods.  -Current exercise habits: limited due to pinched nerve -Educated on Cholesterol goals;  Benefits of statin for ASCVD risk reduction; Importance of limiting foods high in cholesterol; -Counseled on diet and exercise extensively Recommended to continue current medication  COPD (Goal: control symptoms and prevent exacerbations) -Controlled -Current treatment  albuterol inhaler 2 puffs every 4 hours prn - using 2-3 times weekly during allergy season Dymista 137-50 mcg/act 1 spray each nostril twice daily  Montelukast 10 mg daily  Spiriva 2.5 mcg/act 1 puff twice daily  -Medications previously tried:  Librarian, academic, cetirizine, Trelegy,  -Gold Grade: Gold 2 (FEV1 50-79%) -Pulmonary function  testing: ratio 53%, FEV1 56%, FVC 84% -Exacerbations requiring treatment in last 6 months: none -Patient reports consistent use of maintenance inhaler -Frequency of rescue inhaler use: 2-3 times weekly during allergy season  -Counseled on Proper inhaler technique; Benefits of consistent maintenance inhaler use -Recommended to continue current medication  Depression/Anxiety (Goal: manage symptoms of depression) -Not ideally controlled -Current treatment: buspirone 30 mg bid  duloxetine 60 mg daily  Clonazepam 0.5 mg bid  -Medications previously tried/failed: paroxetine, trazodone  -PHQ9:  Depression screen [facility] 2/9 02/06/2021 02/01/2021 10/31/2020  Decreased Interest 0 0 1  Down, Depressed, Hopeless 1 1 1   PHQ - 2 Score 1 1 2   Altered sleeping 1 1 2   Tired, decreased energy 1 1 2   Change in appetite 2 1 1   Feeling bad or failure about yourself  0 0 1  Trouble concentrating 0 0 1  Moving slowly or fidgety/restless 0 0 1  Suicidal thoughts 0 0 0  PHQ-9 Score 5 4 10   Difficult doing work/chores Somewhat difficult Somewhat difficult -  Some recent data might be hidden   -Educated on Benefits of medication for symptom control -Recommended to continue current medication November 2022: Unable to conduct PHQ9 today, each time I started patient went on tangent about her house or son. Was unable to go over individual meds for the same reason  Tobacco use (Goal reduce smoking) -Uncontrolled -Previous quit attempts: cold [place], chantix (made her sick), patches -Current treatment  None reported -Patient smokes Within 30 minutes of waking -Patient triggers include: stress and anxiety -On a scale of 1-10, reports MOTIVATION to quit is 1 -On a scale of 1-10, reports CONFIDENCE in quitting is 1 -Provided contact information for [place] [facility]uit Line (1-800-QUIT-NOW) and encouraged patient  to reach out to this group for support. -Counseled on importance of smoking cessation.   Osteoporosis / Osteopenia  (Goal reduce risk of fracture ) -Managed by Peri Jefferson, Endo -Uncontrolled -Last DEXA Scan:  04/06/2020  T-Score femoral neck: -2.1   T-Score forearm radius:  - 3.9  -Patient is a candidate for pharmacologic treatment due to T-Score < -2.5 in femoral neck -Current treatment  alendronate 70 mg weekly  Vitamin D 50,000 units weekly  Tymlos -Medications previously tried:  Evenity -Recommend [id number] units of vitamin D daily. Recommend 1200 mg of calcium daily from dietary and supplemental sources. Counseled on oral bisphosphonate administration: take in the morning, 30 minutes prior to food with 6-8 oz of water. Do not lie down for at least 30 minutes after taking. Recommend weight-bearing and muscle strengthening exercises for building and maintaining bone density.  -Counseled on diet and exercise extensively Recommended to continue current medication Counseled on calcium supplementation  November 2022: Patient unsure of Alendronate start date but will defer to specialist  Chronic Pain (Goal: manage pain) -Not ideally controlled -Current treatment  Oxycodone-acetaminophen 10-325 mg every 6 hours then twice daily prn for severe breakthrough pain  Naloxone nasal spray 4 mg/0.1 ml 1 spray into the nose once Gabapentin 600 mg qid primidone 60 mg bid  Cyclobenzaprine 10 mg tid  voltaren 1% gel daily prn  Lidocaine patch topically as needed -Medications previously tried: none reported  -Counseled on diet and exercise extensively Counseled on calcium intake and the importance while on alendronate.   GI (Goal: improve symptoms management) -Controlled -Current treatment  Omeprazole 40 mg bid Amitiza 8 mcg bid with a meal -Medications previously tried: ondansetron  -Recommended to continue current medication  Fibromyalgia (Goal: minimize flares/pain) -Managed by pain management -Controlled -Current treatment  duloxetine 60 mg daily -Medications previously tried: none reported   -Recommended to continue current medication  Hypothyroidism (Goal: TSH 2-3) -Not ideally controlled -Current treatment  levothyroxine 50 mcg daily in the morning  -Medications previously tried: none reported  -Recommended considering dose adjustment for goal TSH 2-3.      Patient Goals/Self-Care Activities Patient will:  - take medications as prescribed focus on medication adherence by using pill box  Follow Up Plan: Telephone follow up appointment with care management team member scheduled for: June 2023  [name], Pharm.D. - [phone]       The patient verbalized understanding of instructions, educational materials, and care plan provided today and declined offer to receive copy of patient instructions, educational materials, and care plan.  The pharmacy team will reach out to the patient again over the next 90 days.   Lane Hacker, [facility]

## 2021-04-13 DIAGNOSIS — Z981 Arthrodesis status: Secondary | ICD-10-CM | POA: Diagnosis not present

## 2021-04-13 DIAGNOSIS — M961 Postlaminectomy syndrome, not elsewhere classified: Secondary | ICD-10-CM | POA: Diagnosis not present

## 2021-04-13 DIAGNOSIS — M5416 Radiculopathy, lumbar region: Secondary | ICD-10-CM | POA: Diagnosis not present

## 2021-04-13 DIAGNOSIS — M5134 Other intervertebral disc degeneration, thoracic region: Secondary | ICD-10-CM | POA: Diagnosis not present

## 2021-04-14 NOTE — Progress Notes (Signed)
[place]  [address] [place],  [place]  [address] [phone]  Clinic Day:  04/25/2021  Referring physician: [name], MD  This document serves as a record of services personally performed by Marice Potter, MD. It was created on their behalf by [facility], a trained medical scribe. The creation of this record is based on the scribe's personal observations and the provider's statements to them.  HISTORY OF PRESENT ILLNESS:  The patient is a 65 y.o. female with chronic leukocytosis and thrombocythemia.  Extensive lab work in the past did not reveal any evidence of a myeloproliferative disorder being present.  She comes in today to review her peripheral counts.  Since her last visit, the patient has been doing okay.  She is currently dealing with lower back problems which may ultimately require her to undergo surgery.  As it pertains to her clinical counts, she denies having any B symptoms which concern her for her elevated counts being due to an underlying hematologic malignancy.   PHYSICAL EXAM:  Blood pressure (!) 118/59, pulse (!) 108, temperature 97.9 F (36.6 C), resp. rate 15, height '5\' 1"'  (1.549 m), weight 150 lb 8 oz (68.3 kg), SpO2 93 %. Wt Readings from Last 3 Encounters:  04/25/21 150 lb 8 oz (68.3 kg)  02/06/21 146 lb 9.6 oz (66.5 kg)  02/01/21 142 lb 9.6 oz (64.7 kg)   Body mass index is 28.44 kg/m. Performance status (ECOG): 1 - Symptomatic but completely ambulatory Physical Exam Constitutional:      Appearance: Normal appearance. She is not ill-appearing.  HENT:     Mouth/Throat:     Mouth: Mucous membranes are moist.     Pharynx: Oropharynx is clear. No oropharyngeal exudate or posterior oropharyngeal erythema.  Cardiovascular:     Rate and Rhythm: Normal rate and regular rhythm.     Heart sounds: No murmur heard.   No friction rub. No gallop.  Pulmonary:     Effort: Pulmonary effort is normal. No respiratory  distress.     Breath sounds: Normal breath sounds. No wheezing, rhonchi or rales.  Abdominal:     General: Bowel sounds are normal. There is no distension.     Palpations: Abdomen is soft. There is no mass.     Tenderness: There is no abdominal tenderness.  Musculoskeletal:        General: No swelling.     Right lower leg: No edema.     Left lower leg: No edema.  Lymphadenopathy:     Cervical: No cervical adenopathy.     Upper Body:     Right upper body: No supraclavicular or axillary adenopathy.     Left upper body: No supraclavicular or axillary adenopathy.     Lower Body: No right inguinal adenopathy. No left inguinal adenopathy.  Skin:    General: Skin is warm.     Coloration: Skin is not jaundiced.     Findings: No lesion or rash.  Neurological:     General: No focal deficit present.     Mental Status: She is alert and oriented to person, place, and time. Mental status is at baseline.  Psychiatric:        Mood and Affect: Mood normal.        Behavior: Behavior normal.        Thought Content: Thought content normal.   LABS:    ASSESSMENT & PLAN:  A 65 y.o. female with leukocytosis and thrombocythemia.  Although  her white cells and platelets remain high, they are not much different than what they have been over these past few visits.  None of her peripheral counts is alarmingly high to where intervention appears necessary.  Her leukocytosis and thrombocythemia will continued to be followed conservatively.  I will see her back in 6 months for repeat clinical assessment.  If her counts precipitously rise over time, a bone marrow biopsy would be considered.  The patient understands all the plans discussed today and is in agreement with them.   I, Rita Ohara, am acting as scribe for Marice Potter, MD    I have reviewed this report as typed by the medical scribe, and it is complete and accurate.  Rosalyn Archambault Macarthur Critchley, MD

## 2021-04-17 DIAGNOSIS — I2510 Atherosclerotic heart disease of native coronary artery without angina pectoris: Secondary | ICD-10-CM | POA: Diagnosis not present

## 2021-04-17 DIAGNOSIS — J439 Emphysema, unspecified: Secondary | ICD-10-CM | POA: Diagnosis not present

## 2021-04-17 DIAGNOSIS — I700 Atherosclerosis of aorta: Secondary | ICD-10-CM | POA: Diagnosis not present

## 2021-04-17 DIAGNOSIS — J9811 Atelectasis: Secondary | ICD-10-CM | POA: Diagnosis not present

## 2021-04-17 DIAGNOSIS — I10 Essential (primary) hypertension: Secondary | ICD-10-CM | POA: Diagnosis not present

## 2021-04-17 DIAGNOSIS — J90 Pleural effusion, not elsewhere classified: Secondary | ICD-10-CM | POA: Diagnosis not present

## 2021-04-17 DIAGNOSIS — R918 Other nonspecific abnormal finding of lung field: Secondary | ICD-10-CM | POA: Diagnosis not present

## 2021-04-25 ENCOUNTER — Other Ambulatory Visit: Payer: Self-pay

## 2021-04-25 ENCOUNTER — Other Ambulatory Visit: Payer: Self-pay | Admitting: Hematology and Oncology

## 2021-04-25 ENCOUNTER — Inpatient Hospital Stay: Payer: Medicare Other | Attending: Oncology

## 2021-04-25 ENCOUNTER — Inpatient Hospital Stay (INDEPENDENT_AMBULATORY_CARE_PROVIDER_SITE_OTHER): Payer: Medicare Other | Admitting: Oncology

## 2021-04-25 ENCOUNTER — Other Ambulatory Visit: Payer: Self-pay | Admitting: Oncology

## 2021-04-25 VITALS — BP 118/59 | HR 108 | Temp 97.9°F | Resp 15 | Ht 61.0 in | Wt 150.5 lb

## 2021-04-25 DIAGNOSIS — R7989 Other specified abnormal findings of blood chemistry: Secondary | ICD-10-CM

## 2021-04-25 DIAGNOSIS — D649 Anemia, unspecified: Secondary | ICD-10-CM | POA: Diagnosis not present

## 2021-04-25 DIAGNOSIS — D72829 Elevated white blood cell count, unspecified: Secondary | ICD-10-CM

## 2021-04-25 DIAGNOSIS — D729 Disorder of white blood cells, unspecified: Secondary | ICD-10-CM | POA: Diagnosis not present

## 2021-04-25 LAB — CBC AND DIFFERENTIAL
HCT: 43 (ref 36–46)
Hemoglobin: 14 (ref 12.0–16.0)
Neutrophils Absolute: 12.17
Platelets: 263 (ref 150–399)
WBC: 15.6

## 2021-04-25 LAB — CBC
MCV: 89 (ref 81–99)
RBC: 4.86 (ref 3.87–5.11)

## 2021-04-26 DIAGNOSIS — M810 Age-related osteoporosis without current pathological fracture: Secondary | ICD-10-CM | POA: Diagnosis not present

## 2021-04-26 DIAGNOSIS — E039 Hypothyroidism, unspecified: Secondary | ICD-10-CM | POA: Diagnosis not present

## 2021-04-26 DIAGNOSIS — E782 Mixed hyperlipidemia: Secondary | ICD-10-CM

## 2021-04-26 DIAGNOSIS — F418 Other specified anxiety disorders: Secondary | ICD-10-CM

## 2021-04-27 DIAGNOSIS — E2740 Unspecified adrenocortical insufficiency: Secondary | ICD-10-CM | POA: Diagnosis not present

## 2021-04-27 DIAGNOSIS — E039 Hypothyroidism, unspecified: Secondary | ICD-10-CM | POA: Diagnosis not present

## 2021-04-27 DIAGNOSIS — M810 Age-related osteoporosis without current pathological fracture: Secondary | ICD-10-CM | POA: Diagnosis not present

## 2021-04-27 DIAGNOSIS — Z8639 Personal history of other endocrine, nutritional and metabolic disease: Secondary | ICD-10-CM | POA: Diagnosis not present

## 2021-04-27 DIAGNOSIS — E559 Vitamin D deficiency, unspecified: Secondary | ICD-10-CM | POA: Diagnosis not present

## 2021-05-01 ENCOUNTER — Telehealth: Payer: Self-pay

## 2021-05-01 NOTE — Chronic Care Management (AMB) (Signed)
Chronic Care Management Pharmacy Assistant   Name: KAMARA ALLAN  MRN: [id number] DOB: [birth year]   Reason for Encounter: General Adherence Call     Recent office visits:  None  Recent consult visits:  04/25/21 (Oncology) Lavera Guise MD. Seen for Leukocytosis. No med changes.   04/13/21 (Neurosurgery) Judson Roch Ardis Rowan PA-C. No med changes.   Hospital visits:  None   Medications: Outpatient Encounter Medications as of 05/01/2021  Medication Sig   Abaloparatide (TYMLOS) 3120 MCG/1.56ML SOPN Inject 80 mcg into the skin daily.   albuterol (VENTOLIN HFA) 108 (90 Base) MCG/ACT inhaler Inhale 2 puffs into the lungs every 4 (four) hours as needed.   atorvastatin (LIPITOR) 40 MG tablet TAKE 1 TABLET BY MOUTH  DAILY   bethanechol (URECHOLINE) 25 MG tablet Take 1 tablet (25 mg total) by mouth in the morning and at bedtime.   busPIRone (BUSPAR) 30 MG tablet Take 1 tablet (30 mg total) by mouth 2 (two) times daily.   clonazePAM (KLONOPIN) 0.5 MG tablet TAKE 1 TABLET BY MOUTH TWICE A DAY   cyclobenzaprine (FLEXERIL) 10 MG tablet Take 1 tablet (10 mg total) by mouth 3 (three) times daily.   D-Mannose 500 MG CAPS Take by mouth.   diclofenac Sodium (VOLTAREN) 1 % GEL Apply 20 g topically 4 (four) times daily.   DULoxetine (CYMBALTA) 60 MG capsule Take 1 capsule (60 mg total) by mouth daily.   DYMISTA 137-50 MCG/ACT SUSP One spray each nostril twice a day.   fenofibrate 160 MG tablet Take 1 tablet (160 mg total) by mouth daily.   gabapentin (NEURONTIN) 600 MG tablet TAKE 1 TABLET BY MOUTH 4  TIMES DAILY   hydrocortisone (CORTEF) 20 MG tablet Take 1.5 tablets (30 mg total) by mouth daily. Take 1 tablet in the morning and 1/2 tablet at 4pm   Insulin Pen Needle (GLOBAL EASY GLIDE PEN NEEDLES) 32G X 4 MM MISC Use one needle daily as directed   levocetirizine (XYZAL) 5 MG tablet TAKE 1 TABLET BY MOUTH EVERY DAY IN THE EVENING   levothyroxine (SYNTHROID) 50 MCG tablet TAKE 1 TABLET BY MOUTH  IN  THE MORNING   lubiprostone (AMITIZA) 8 MCG capsule Take 1 capsule (8 mcg total) by mouth 2 (two) times daily with a meal.   montelukast (SINGULAIR) 10 MG tablet Take 1 tablet (10 mg total) by mouth daily.   Multiple Vitamin (MULTI-VITAMINS) TABS Take 1 tablet by mouth daily.   naloxone (NARCAN) nasal spray 4 mg/0.1 mL Place 1 spray into the nose once. (for suspected overdose)   nitrofurantoin, macrocrystal-monohydrate, (MACROBID) 100 MG capsule Take 100 mg by mouth daily.   omeprazole (PRILOSEC) 40 MG capsule Take 1 capsule (40 mg total) by mouth 2 (two) times daily.   oxyCODONE-acetaminophen (PERCOCET) 10-325 MG tablet TAKE 1 TAB EVERY 4 HOURS FOR 5 DAYS THEN 1 TAB TWICE A DAY AS NEEDED FOR SEVERE BREAKTHROUGH PAIN   primidone (MYSOLINE) 50 MG tablet Take 1 tablet (50 mg total) by mouth 2 (two) times daily.   Tiotropium Bromide Monohydrate (SPIRIVA RESPIMAT) 2.5 MCG/ACT AERS Inhale 1 puff into the lungs 2 (two) times daily.   Vitamin D, Ergocalciferol, (DRISDOL) 50000 units CAPS capsule Take 1 capsule by mouth once a week.   No facility-administered encounter medications on file as of 05/01/2021.    Recent Relevant Labs: No results found for: HGBA1C, MICROALBUR  Kidney Function Lab Results  Component Value Date/Time   CREATININE 0.78 02/01/2021 10:40 AM   CREATININE  0.62 08/31/2020 03:13 PM   UXYBFXOV 29 04/26/2020 11:06 AM   GFRAA 91 04/26/2020 11:06 AM    Contacted [address] for general disease state and medication adherence call.   Patient is > 5 days past due for refill on the following medications per chart history:  [facility] Medications: Medication Name/mg Last Fill Days Supply Atorvastatin 40 mg  03/07/21 90ds 12/02/20  90ds  What concerns do you have about your medications? Pt has no concerns   The patient denies side effects with her medications.   How often do you forget or accidentally miss a dose? Never  Do you use a pillbox? Yes  Are you having any problems  getting your medications from your pharmacy? Pt stated she has a hard time getting her Clonazepam from [facility] due to it being controlled and she has to drive out there to get her medications so she isn't going a day without. She stated she is in a lot of pain and its hard for her to get out when she needs too.   Has the cost of your medications been a concern? No  Since last visit with CPP, no interventions have been made:   The patient has not had an ED visit since last contact.   The patient reports the following problems with their health. She just had a bone density test and they stated her test was bad and the shots she's taking are not significantly Improving her results. Pt stated she stays in pain all the time and the pain medicine is not helping her enough. Pt stated she is seeing a spine surgeon to see what they can do to help her relieve some pain.     Care Gaps: Last annual wellness visit: 02/06/21 Mammogram:1111/22 Colonoscopy:06/04/19 Dexa Scan: 04/06/20  [name], [place] Pharmacist Assistant  [phone]

## 2021-05-02 DIAGNOSIS — M5416 Radiculopathy, lumbar region: Secondary | ICD-10-CM | POA: Insufficient documentation

## 2021-05-02 HISTORY — DX: Radiculopathy, lumbar region: M54.16

## 2021-05-07 ENCOUNTER — Other Ambulatory Visit: Payer: Self-pay | Admitting: Physician Assistant

## 2021-05-08 ENCOUNTER — Ambulatory Visit (INDEPENDENT_AMBULATORY_CARE_PROVIDER_SITE_OTHER): Payer: Medicare Other

## 2021-05-08 ENCOUNTER — Telehealth: Payer: Self-pay | Admitting: Oncology

## 2021-05-08 ENCOUNTER — Other Ambulatory Visit: Payer: Self-pay

## 2021-05-08 ENCOUNTER — Encounter: Payer: Self-pay | Admitting: Family Medicine

## 2021-05-08 ENCOUNTER — Ambulatory Visit (INDEPENDENT_AMBULATORY_CARE_PROVIDER_SITE_OTHER): Payer: Medicare Other | Admitting: Family Medicine

## 2021-05-08 VITALS — BP 134/76 | HR 84 | Temp 96.9°F | Resp 16 | Ht 61.0 in | Wt 150.6 lb

## 2021-05-08 DIAGNOSIS — F17218 Nicotine dependence, cigarettes, with other nicotine-induced disorders: Secondary | ICD-10-CM | POA: Diagnosis not present

## 2021-05-08 DIAGNOSIS — E559 Vitamin D deficiency, unspecified: Secondary | ICD-10-CM | POA: Diagnosis not present

## 2021-05-08 DIAGNOSIS — J410 Simple chronic bronchitis: Secondary | ICD-10-CM

## 2021-05-08 DIAGNOSIS — E039 Hypothyroidism, unspecified: Secondary | ICD-10-CM

## 2021-05-08 DIAGNOSIS — E271 Primary adrenocortical insufficiency: Secondary | ICD-10-CM | POA: Diagnosis not present

## 2021-05-08 DIAGNOSIS — M810 Age-related osteoporosis without current pathological fracture: Secondary | ICD-10-CM | POA: Diagnosis not present

## 2021-05-08 DIAGNOSIS — T402X5A Adverse effect of other opioids, initial encounter: Secondary | ICD-10-CM

## 2021-05-08 DIAGNOSIS — Z23 Encounter for immunization: Secondary | ICD-10-CM

## 2021-05-08 DIAGNOSIS — F331 Major depressive disorder, recurrent, moderate: Secondary | ICD-10-CM

## 2021-05-08 DIAGNOSIS — K5903 Drug induced constipation: Secondary | ICD-10-CM | POA: Diagnosis not present

## 2021-05-08 DIAGNOSIS — M818 Other osteoporosis without current pathological fracture: Secondary | ICD-10-CM

## 2021-05-08 DIAGNOSIS — F17210 Nicotine dependence, cigarettes, uncomplicated: Secondary | ICD-10-CM | POA: Diagnosis not present

## 2021-05-08 DIAGNOSIS — T380X5A Adverse effect of glucocorticoids and synthetic analogues, initial encounter: Secondary | ICD-10-CM

## 2021-05-08 DIAGNOSIS — D75839 Thrombocytosis, unspecified: Secondary | ICD-10-CM

## 2021-05-08 DIAGNOSIS — E782 Mixed hyperlipidemia: Secondary | ICD-10-CM | POA: Diagnosis not present

## 2021-05-08 MED ORDER — FENOFIBRATE 160 MG PO TABS: 160.0000 mg | ORAL_TABLET | Freq: Every day | ORAL | 1 refills | Status: DC

## 2021-05-08 NOTE — Progress Notes (Signed)
Subjective:  Patient ID: Connie Coffey, female    DOB: 1955/10/08  Age: 65 y.o. MRN: [id number]  Chief Complaint  Patient presents with   Osteoporosis   Hyperlipidemia    HPI Patient is a 65 year old white female who presents for chronic follow-up of hyperlipidemia, hypothyroidism, depression, chronic pain syndrome from, COPD renal insufficiency, osteoporosis and vitamin D deficiency.  Hyperlipidemia: Lipitor 40 mg once daily.  Fenofibrate 160 mg once daily. depression with anxiety: Currently on buspirone 30 mg twice daily, clonazepam 0.5 mg Cymbalta 60 mg daily. Patient is still very depressed.  She sees psychiatry. Chronic pain syndrome: Currently has chronic pain in her back.  She sees the pain clinic.  She is on Percocet 10 325 mg 4 times daily and gabapentin 600 mg 4 times daily, cyclobenzaprine 10 mg 3 times daily, diclofenac 1% gel apply 4 times a day to areas of pain.  Patient does have Narcan at home.  Patient sees [place] [facility]. COPD: Currently on Spiriva 1 puff twice daily.  Patient is also on allergy medicines which include Xyzal 5 mg once daily, Dymista, Singulair 10 mg once daily.  Also has albuterol 2 puffs 4 times daily as needed.  Patient continues to smoke. Pt sees Dr. Duane Boston, [facility].  Hypothyroidism: Currently on Synthroid 50 mcg once daily.  Patient sees endocrinology.  Vitamin D deficiency currently taking vitamin D 50,000 once weekly.  Adrenal insufficiency currently on Cortef 30 mg daily.  Patient sees endocrinology, Peri Jefferson, nurse practitioner.  Osteoporosis on Tymlos 80 mcg daily.  Patient sees endocrinology.  Opioid-induced constipation: Currently on Amitiza 8 mcg twice daily  GERD: Currently on omeprazole 40 mg twice daily.    Patient also has chronic leukocytosis and thrombocythemia and is seeing [facility] oncology for this.  Patient has had a full work-up and no obvious source has been found.  She was last seen April 25, 2021 and I will see her  back in 6 months.  At that time he may consider a bone marrow biopsy.  Current Outpatient Medications on File Prior to Visit  Medication Sig Dispense Refill   Abaloparatide (TYMLOS) 3120 MCG/1.56ML SOPN Inject 80 mcg into the skin daily. 4.68 mL 1   albuterol (VENTOLIN HFA) 108 (90 Base) MCG/ACT inhaler Inhale 2 puffs into the lungs every 4 (four) hours as needed. 18 g 2   atorvastatin (LIPITOR) 40 MG tablet TAKE 1 TABLET BY MOUTH  DAILY 90 tablet 3   bethanechol (URECHOLINE) 25 MG tablet Take 1 tablet (25 mg total) by mouth in the morning and at bedtime. 180 tablet 1   busPIRone (BUSPAR) 30 MG tablet TAKE 1 TABLET BY MOUTH 2 TIMES DAILY. 180 tablet 1   clonazePAM (KLONOPIN) 0.5 MG tablet TAKE 1 TABLET BY MOUTH TWICE A DAY 60 tablet 1   cyclobenzaprine (FLEXERIL) 10 MG tablet Take 1 tablet (10 mg total) by mouth 3 (three) times daily. 270 tablet 1   D-Mannose 500 MG CAPS Take by mouth.     diclofenac Sodium (VOLTAREN) 1 % GEL Apply 20 g topically 4 (four) times daily. 20 g 0   DULoxetine (CYMBALTA) 60 MG capsule Take 1 capsule (60 mg total) by mouth daily. 180 capsule 1   DYMISTA 137-50 MCG/ACT SUSP One spray each nostril twice a day. 23 g 3   gabapentin (NEURONTIN) 600 MG tablet TAKE 1 TABLET BY MOUTH 4  TIMES DAILY 360 tablet 3   hydrocortisone (CORTEF) 20 MG tablet Take 1.5 tablets (30 mg total) by  mouth daily. Take 1 tablet in the morning and 1/2 tablet at 4pm 135 tablet 1   Insulin Pen Needle (GLOBAL EASY GLIDE PEN NEEDLES) 32G X 4 MM MISC Use one needle daily as directed     levocetirizine (XYZAL) 5 MG tablet TAKE 1 TABLET BY MOUTH EVERY DAY IN THE EVENING 90 tablet 1   levothyroxine (SYNTHROID) 50 MCG tablet TAKE 1 TABLET BY MOUTH IN  THE MORNING 90 tablet 3   lubiprostone (AMITIZA) 8 MCG capsule Take 1 capsule (8 mcg total) by mouth 2 (two) times daily with a meal. 180 capsule 1   montelukast (SINGULAIR) 10 MG tablet Take 1 tablet (10 mg total) by mouth daily. 90 tablet 3   Multiple  Vitamin (MULTI-VITAMINS) TABS Take 1 tablet by mouth daily.     naloxone (NARCAN) nasal spray 4 mg/0.1 mL Place 1 spray into the nose once. (for suspected overdose)     omeprazole (PRILOSEC) 40 MG capsule Take 1 capsule (40 mg total) by mouth 2 (two) times daily. 180 capsule 3   oxyCODONE-acetaminophen (PERCOCET) 10-325 MG tablet TAKE 1 TAB EVERY 4 HOURS FOR 5 DAYS THEN 1 TAB TWICE A DAY AS NEEDED FOR SEVERE BREAKTHROUGH PAIN  0   primidone (MYSOLINE) 50 MG tablet Take 1 tablet (50 mg total) by mouth 2 (two) times daily. 180 tablet 1   Tiotropium Bromide Monohydrate (SPIRIVA RESPIMAT) 2.5 MCG/ACT AERS Inhale 1 puff into the lungs 2 (two) times daily. 12 g 1   Vitamin D, Ergocalciferol, (DRISDOL) 50000 units CAPS capsule Take 1 capsule by mouth once a week.     No current facility-administered medications on file prior to visit.   Past Medical History:  Diagnosis Date   Adrenal insufficiency (HCC)    Arthritis    Carpal tunnel syndrome    Both hands, worse in right   Chronic constipation    COPD (chronic obstructive pulmonary disease) (HCC)    CVA (cerebral vascular accident) ([place])    Facet syndrome    Fibromyalgia    GERD (gastroesophageal reflux disease)    Heart murmur    Hypercholesteremia    IBS (irritable bowel syndrome)    Ischemic stroke ([place])    07/19/2004   Lumbar radiculopathy    Osteoporosis    RLS (restless legs syndrome)    Spinal stenosis    TMJ (temporomandibular joint disorder)    Trochanteric bursitis of both hips    Vascular malformation    spinal cord   Past Surgical History:  Procedure Laterality Date   APPENDECTOMY     CARPAL TUNNEL RELEASE Right 12/13/2017   surgery   CHOLECYSTECTOMY     COLONOSCOPY  05/06/2017   Colonic polyps status post polypectomy. Interan land external hemorrhoids.    ESOPHAGOGASTRODUODENOSCOPY  04/28/2007   Mild gastrtiis. Otherwise normal EGD.    HEMORROIDECTOMY     LAMINECTOMY AND MICRODISCECTOMY LUMBAR SPINE  01/03/2005    NECK SURGERY     x2    OTHER SURGICAL HISTORY  04/24/2004   anterior cervical discectomy and fusion at C5-C6   TONSILLECTOMY     TOTAL ABDOMINAL HYSTERECTOMY      Family History  Problem Relation Age of Onset   Supraventricular tachycardia Mother    Dementia Mother    Hyperlipidemia Mother    Hypertension Mother    Osteoarthritis Mother    Osteoporosis Mother    Breast cancer Other    Breast cancer Other    Colon cancer Neg Hx  Esophageal cancer Neg Hx    Social History   Socioeconomic History   Marital status: Divorced    Spouse name: Not on file   Number of children: 1   Years of education: Not on file   Highest education level: Not on file  Occupational History   Occupation: Disabled  Tobacco Use   Smoking status: Every Day    Packs/day: 1.00    Types: Cigarettes   Smokeless tobacco: Never  Vaping Use   Vaping Use: Never used  Substance and Sexual Activity   Alcohol use: Not Currently    Comment: quit 19 years   Drug use: Never   Sexual activity: Not Currently  Other Topics Concern   Not on file  Social History Narrative   Disabled son lives with her sometimes and his father sometimes.  Iola is working hard to have a relationship with her son's father and his wife.  They help her when needed.   Social Determinants of Health   Financial Resource Strain: Not on file  Food Insecurity: Not on file  Transportation Needs: Unmet Transportation Needs   Lack of Transportation (Medical): Yes   Lack of Transportation (Non-Medical): Yes  Physical Activity: Not on file  Stress: Not on file  Social Connections: Not on file    Review of Systems  Constitutional:  Positive for fatigue. Negative for chills and fever.  HENT:  Positive for congestion and ear pain (ears popping). Negative for rhinorrhea and sore throat.   Respiratory:  Positive for cough and shortness of breath.   Cardiovascular:  Negative for chest pain.  Gastrointestinal:  Positive for constipation.  Negative for abdominal pain, diarrhea, nausea and vomiting.  Endocrine: Positive for polydipsia and polyphagia.  Genitourinary:  Negative for dysuria and urgency.       Poor bladder control   Musculoskeletal:  Positive for arthralgias, back pain and myalgias.  Neurological:  Positive for weakness. Negative for dizziness, light-headedness and headaches.  Psychiatric/Behavioral:  Negative for dysphoric mood. The patient is not nervous/anxious.     Objective:  BP 134/76   Pulse 84   Temp (!) 96.9 F (36.1 C)   Resp 16   Ht $R'5\' 1"'Zx$  (1.549 m)   Wt 150 lb 9.6 oz (68.3 kg)   BMI 28.46 kg/m   BP/Weight 05/08/2021 04/25/2021 6/38/7564  Systolic BP 332 951 884  Diastolic BP 76 59 64  Wt. (Lbs) 150.6 150.5 146.6  BMI 28.46 28.44 27.7    Physical Exam Vitals reviewed.  Constitutional:      Appearance: Normal appearance. She is normal weight.  HENT:     Right Ear: Tympanic membrane, ear canal and external ear normal.     Left Ear: Tympanic membrane, ear canal and external ear normal.     Nose: Congestion present.     Mouth/Throat:     Pharynx: Oropharynx is clear.  Neck:     Vascular: No carotid bruit.  Cardiovascular:     Rate and Rhythm: Normal rate and regular rhythm.     Heart sounds: Normal heart sounds. No murmur heard. Pulmonary:     Effort: Pulmonary effort is normal. No respiratory distress.     Breath sounds: Normal breath sounds.  Abdominal:     General: Abdomen is flat. Bowel sounds are normal.     Palpations: Abdomen is soft.     Tenderness: There is no abdominal tenderness.  Neurological:     Mental Status: She is alert and oriented to person, place, and  time.  Psychiatric:        Mood and Affect: Mood normal.        Behavior: Behavior normal.    Diabetic Foot Exam - Simple   No data filed      Lab Results  Component Value Date   WBC 15.6 04/25/2021   HGB 14.0 04/25/2021   HCT 43 04/25/2021   PLT 263 04/25/2021   GLUCOSE 98 05/08/2021   CHOL 162  05/08/2021   TRIG 121 05/08/2021   HDL 72 05/08/2021   LDLCALC 69 05/08/2021   ALT 13 05/08/2021   AST 19 05/08/2021   NA 144 05/08/2021   K 4.3 05/08/2021   CL 104 05/08/2021   CREATININE 0.73 05/08/2021   BUN 10 05/08/2021   CO2 25 05/08/2021   TSH 1.550 02/01/2021      Assessment & Plan:   Problem List Items Addressed This Visit       Respiratory   Simple chronic bronchitis (HCC)    Continue current medications.  Recommend quit smoking. Recommend regular follow-up with pulmonology.        Digestive   Therapeutic opioid induced constipation    Continue Amitiza..  Well-controlled.        Endocrine   Acquired hypothyroidism - Primary    The current medical regimen is effective;  continue present plan and medications. Patient is taking levothyroxine 50 mcg daily.      Adrenal insufficiency (Addison's disease) ([place])    The current medical regimen is effective;  continue present plan and medications.        Musculoskeletal and Integument   RESOLVED: Age-related osteoporosis without current pathological fracture   Steroid-induced osteoporosis    Continue Tymlos.  Management per endocrinology.        Other   Moderate recurrent major depression ([place])    Hopefully If pain becomes better controlled, her depression will improve.  The current medical regimen is fairly effective;  continue present plan and medications.       Cigarette smoker    Strongly recommend cessation.  Patient is not feel she is able to do this now.      Vitamin D deficiency    The current medical regimen is effective;  continue present plan and medications.      Relevant Orders   VITAMIN D 25 Hydroxy (Vit-D Deficiency, Fractures) (Completed)   Mixed hyperlipidemia    Well controlled.  No changes to medicines.  Continue to work on eating a healthy diet and exercise.  Labs drawn today.       Relevant Medications   fenofibrate 160 MG tablet   Other Relevant Orders   Comprehensive  metabolic panel (Completed)   Lipid panel (Completed)   Other Visit Diagnoses     Thrombocythemia       Cigarette nicotine dependence with other nicotine-induced disorder         .  Meds ordered this encounter  Medications   fenofibrate 160 MG tablet    Sig: Take 1 tablet (160 mg total) by mouth daily.    Dispense:  90 tablet    Refill:  1    Orders Placed This Encounter  Procedures   Comprehensive metabolic panel   Lipid panel   VITAMIN D 25 Hydroxy (Vit-D Deficiency, Fractures)   Cardiovascular Risk Assessment    Follow-up: Return in about 3 months (around 08/06/2021) for chronic fasting.  An After Visit Summary was printed and given to the patient.  [name], MD [facility] [phone]

## 2021-05-09 LAB — COMPREHENSIVE METABOLIC PANEL
ALT: 13 IU/L (ref 0–32)
AST: 19 IU/L (ref 0–40)
Albumin/Globulin Ratio: 2.1 (ref 1.2–2.2)
Albumin: 4.4 g/dL (ref 3.8–4.8)
Alkaline Phosphatase: 44 IU/L (ref 44–121)
BUN/Creatinine Ratio: 14 (ref 12–28)
BUN: 10 mg/dL (ref 8–27)
Bilirubin Total: <0.2 mg/dL (ref 0.0–1.2)
CO2: 25 mmol/L (ref 20–29)
Calcium: 9.5 mg/dL (ref 8.7–10.3)
Chloride: 104 mmol/L (ref 96–106)
Creatinine, Ser: 0.73 mg/dL (ref 0.57–1.00)
Globulin, Total: 2.1 g/dL (ref 1.5–4.5)
Glucose: 98 mg/dL (ref 70–99)
Potassium: 4.3 mmol/L (ref 3.5–5.2)
Sodium: 144 mmol/L (ref 134–144)
Total Protein: 6.5 g/dL (ref 6.0–8.5)
eGFR: 90 mL/min/{1.73_m2} (ref >59)

## 2021-05-09 LAB — LIPID PANEL
Chol/HDL Ratio: 2.3 ratio (ref 0.0–4.4)
Cholesterol, Total: 162 mg/dL (ref 100–199)
HDL: 72 mg/dL (ref >39)
LDL Chol Calc (NIH): 69 mg/dL (ref 0–99)
Triglycerides: 121 mg/dL (ref 0–149)
VLDL Cholesterol Cal: 21 mg/dL (ref 5–40)

## 2021-05-09 LAB — VITAMIN D 25 HYDROXY (VIT D DEFICIENCY, FRACTURES): Vit D, 25-Hydroxy: 49.3 ng/mL (ref 30.0–100.0)

## 2021-05-10 NOTE — Assessment & Plan Note (Signed)
The current medical regimen is effective;  continue present plan and medications. Patient is taking levothyroxine 50 mcg daily.

## 2021-05-10 NOTE — Assessment & Plan Note (Signed)
The current medical regimen is effective;  continue present plan and medications.  

## 2021-05-10 NOTE — Assessment & Plan Note (Signed)
Well controlled.  ?No changes to medicines.  ?Continue to work on eating a healthy diet and exercise.  ?Labs drawn today.  ?

## 2021-05-16 ENCOUNTER — Telehealth (INDEPENDENT_AMBULATORY_CARE_PROVIDER_SITE_OTHER): Payer: Medicare Other | Admitting: Nurse Practitioner

## 2021-05-16 ENCOUNTER — Encounter: Payer: Self-pay | Admitting: Nurse Practitioner

## 2021-05-16 VITALS — HR 82

## 2021-05-16 DIAGNOSIS — U071 COVID-19: Secondary | ICD-10-CM | POA: Diagnosis not present

## 2021-05-16 DIAGNOSIS — R058 Other specified cough: Secondary | ICD-10-CM | POA: Diagnosis not present

## 2021-05-16 LAB — POC COVID19 BINAXNOW: SARS Coronavirus 2 Ag: POSITIVE — AB

## 2021-05-16 LAB — POC INFLUENZA A&B (BINAX/QUICKVUE)
Influenza A, POC: NEGATIVE
Influenza B, POC: NEGATIVE

## 2021-05-16 MED ORDER — MOLNUPIRAVIR EUA 200MG CAPSULE: 4.0000 | ORAL_CAPSULE | Freq: Two times a day (BID) | ORAL | 0 refills | Status: AC

## 2021-05-16 MED ORDER — BENZONATATE 100 MG PO CAPS
100.0000 mg | ORAL_CAPSULE | Freq: Two times a day (BID) | ORAL | 0 refills | Status: DC | PRN
Start: 2021-05-16 — End: 2021-08-07

## 2021-05-16 NOTE — Progress Notes (Signed)
Virtual Visit via Video Note   This visit type was conducted due to national recommendations for restrictions regarding the COVID-19 Pandemic (e.g. social distancing) in an effort to limit this patient's exposure and mitigate transmission in our community.  Due to her co-morbid illnesses, this patient is at least at moderate risk for complications without adequate follow up.  This format is felt to be most appropriate for this patient at this time.  All issues noted in this document were discussed and addressed.  A limited physical exam was performed with this format.  A verbal consent was obtained for the virtual visit.   Date:  05/16/2021   ID:  Connie Coffey, DOB Feb 18, 1956, MRN [id number]  Patient Location: Other:  [place] parking lot Provider Location: Office/Clinic  PCP:  [name], MD   Evaluation Performed:  Established patient, acute telemedicine visit  Chief Complaint:  cough  History of Present Illness:    Connie Coffey is a 65 y.o. female with cough. She has a chronic cough due to cigarette smoking, unable to determine onset of symptoms. She estimates 5 days ago. She has obtained COVID-19 and seasonal flu vaccines.   The patient does have symptoms concerning for COVID-19 infection (fever, chills, cough, or new shortness of breath).    Past Medical History:  Diagnosis Date   Adrenal insufficiency (HCC)    Arthritis    Carpal tunnel syndrome    Both hands, worse in right   Chronic constipation    COPD (chronic obstructive pulmonary disease) (HCC)    CVA (cerebral vascular accident) ([place])    Facet syndrome    Fibromyalgia    GERD (gastroesophageal reflux disease)    Heart murmur    Hypercholesteremia    IBS (irritable bowel syndrome)    Ischemic stroke ([place])    07/19/2004   Lumbar radiculopathy    Osteoporosis    RLS (restless legs syndrome)    Spinal stenosis    TMJ (temporomandibular joint disorder)    Trochanteric bursitis of both hips     Vascular malformation    spinal cord    Past Surgical History:  Procedure Laterality Date   APPENDECTOMY     CARPAL TUNNEL RELEASE Right 12/13/2017   surgery   CHOLECYSTECTOMY     COLONOSCOPY  05/06/2017   Colonic polyps status post polypectomy. Interan land external hemorrhoids.    ESOPHAGOGASTRODUODENOSCOPY  04/28/2007   Mild gastrtiis. Otherwise normal EGD.    HEMORROIDECTOMY     LAMINECTOMY AND MICRODISCECTOMY LUMBAR SPINE  01/03/2005   NECK SURGERY     x2    OTHER SURGICAL HISTORY  04/24/2004   anterior cervical discectomy and fusion at C5-C6   TONSILLECTOMY     TOTAL ABDOMINAL HYSTERECTOMY      Family History  Problem Relation Age of Onset   Supraventricular tachycardia Mother    Dementia Mother    Hyperlipidemia Mother    Hypertension Mother    Osteoarthritis Mother    Osteoporosis Mother    Breast cancer Other    Breast cancer Other    Colon cancer Neg Hx    Esophageal cancer Neg Hx     Social History   Socioeconomic History   Marital status: Divorced    Spouse name: Not on file   Number of children: 1   Years of education: Not on file   Highest education level: Not on file  Occupational History   Occupation: Disabled  Tobacco Use   Smoking status:  Every Day    Packs/day: 1.00    Types: Cigarettes   Smokeless tobacco: Never  Vaping Use   Vaping Use: Never used  Substance and Sexual Activity   Alcohol use: Not Currently    Comment: quit 19 years   Drug use: Never   Sexual activity: Not Currently  Other Topics Concern   Not on file  Social History Narrative   Disabled son lives with her sometimes and his father sometimes.  Andrell is working hard to have a relationship with her son's father and his wife.  They help her when needed.   Social Determinants of Health   Financial Resource Strain: Not on file  Food Insecurity: Not on file  Transportation Needs: Unmet Transportation Needs   Lack of Transportation (Medical): Yes   Lack of  Transportation (Non-Medical): Yes  Physical Activity: Not on file  Stress: Not on file  Social Connections: Not on file  Intimate Partner Violence: Not on file    Outpatient Medications Prior to Visit  Medication Sig Dispense Refill   Abaloparatide (TYMLOS) 3120 MCG/1.56ML SOPN Inject 80 mcg into the skin daily. 4.68 mL 1   albuterol (VENTOLIN HFA) 108 (90 Base) MCG/ACT inhaler Inhale 2 puffs into the lungs every 4 (four) hours as needed. 18 g 2   atorvastatin (LIPITOR) 40 MG tablet TAKE 1 TABLET BY MOUTH  DAILY 90 tablet 3   bethanechol (URECHOLINE) 25 MG tablet Take 1 tablet (25 mg total) by mouth in the morning and at bedtime. 180 tablet 1   busPIRone (BUSPAR) 30 MG tablet TAKE 1 TABLET BY MOUTH 2 TIMES DAILY. 180 tablet 1   clonazePAM (KLONOPIN) 0.5 MG tablet TAKE 1 TABLET BY MOUTH TWICE A DAY 60 tablet 1   cyclobenzaprine (FLEXERIL) 10 MG tablet Take 1 tablet (10 mg total) by mouth 3 (three) times daily. 270 tablet 1   D-Mannose 500 MG CAPS Take by mouth.     diclofenac Sodium (VOLTAREN) 1 % GEL Apply 20 g topically 4 (four) times daily. 20 g 0   DULoxetine (CYMBALTA) 60 MG capsule Take 1 capsule (60 mg total) by mouth daily. 180 capsule 1   DYMISTA 137-50 MCG/ACT SUSP One spray each nostril twice a day. 23 g 3   fenofibrate 160 MG tablet Take 1 tablet (160 mg total) by mouth daily. 90 tablet 1   gabapentin (NEURONTIN) 600 MG tablet TAKE 1 TABLET BY MOUTH 4  TIMES DAILY 360 tablet 3   hydrocortisone (CORTEF) 20 MG tablet Take 1.5 tablets (30 mg total) by mouth daily. Take 1 tablet in the morning and 1/2 tablet at 4pm 135 tablet 1   Insulin Pen Needle (GLOBAL EASY GLIDE PEN NEEDLES) 32G X 4 MM MISC Use one needle daily as directed     levocetirizine (XYZAL) 5 MG tablet TAKE 1 TABLET BY MOUTH EVERY DAY IN THE EVENING 90 tablet 1   levothyroxine (SYNTHROID) 50 MCG tablet TAKE 1 TABLET BY MOUTH IN  THE MORNING 90 tablet 3   lubiprostone (AMITIZA) 8 MCG capsule Take 1 capsule (8 mcg total)  by mouth 2 (two) times daily with a meal. 180 capsule 1   montelukast (SINGULAIR) 10 MG tablet Take 1 tablet (10 mg total) by mouth daily. 90 tablet 3   Multiple Vitamin (MULTI-VITAMINS) TABS Take 1 tablet by mouth daily.     naloxone (NARCAN) nasal spray 4 mg/0.1 mL Place 1 spray into the nose once. (for suspected overdose)     omeprazole (PRILOSEC) 40  MG capsule Take 1 capsule (40 mg total) by mouth 2 (two) times daily. 180 capsule 3   oxyCODONE-acetaminophen (PERCOCET) 10-325 MG tablet TAKE 1 TAB EVERY 4 HOURS FOR 5 DAYS THEN 1 TAB TWICE A DAY AS NEEDED FOR SEVERE BREAKTHROUGH PAIN  0   primidone (MYSOLINE) 50 MG tablet Take 1 tablet (50 mg total) by mouth 2 (two) times daily. 180 tablet 1   Tiotropium Bromide Monohydrate (SPIRIVA RESPIMAT) 2.5 MCG/ACT AERS Inhale 1 puff into the lungs 2 (two) times daily. 12 g 1   Vitamin D, Ergocalciferol, (DRISDOL) 50000 units CAPS capsule Take 1 capsule by mouth once a week.     No facility-administered medications prior to visit.    Allergies:   Beta vulgaris, Celebrex [celecoxib], Chantix [varenicline], Dog epithelium allergy skin test, Fish oil, Gramineae pollens, Molds & smuts, Nsaids, Other, Nicotrol [nicotine], and Penicillins   Social History   Tobacco Use   Smoking status: Every Day    Packs/day: 1.00    Types: Cigarettes   Smokeless tobacco: Never  Vaping Use   Vaping Use: Never used  Substance Use Topics   Alcohol use: Not Currently    Comment: quit 19 years   Drug use: Never     Review of Systems  Constitutional:  Negative for chills, fever and malaise/fatigue.  HENT: Negative.    Eyes: Negative.   Respiratory:  Positive for cough. Negative for shortness of breath.   Gastrointestinal: Negative.   Musculoskeletal: Negative.   Skin:  Negative for rash.  All other systems reviewed and are negative.   Labs/Other Tests and Data Reviewed:    Recent Labs: 02/01/2021: TSH 1.550 04/25/2021: Hemoglobin 14.0; Platelets  263 05/08/2021: ALT 13; BUN 10; Creatinine, Ser 0.73; Potassium 4.3; Sodium 144   Recent Lipid Panel Lab Results  Component Value Date/Time   CHOL 162 05/08/2021 11:37 AM   TRIG 121 05/08/2021 11:37 AM   HDL 72 05/08/2021 11:37 AM   CHOLHDL 2.3 05/08/2021 11:37 AM   LDLCALC 69 05/08/2021 11:37 AM    Wt Readings from Last 3 Encounters:  05/08/21 150 lb 9.6 oz (68.3 kg)  04/25/21 150 lb 8 oz (68.3 kg)  02/06/21 146 lb 9.6 oz (66.5 kg)     Objective:    Vital Signs:  Pulse 82    SpO2 94%     Physical Exam No physical exam due to telemedicine visit  ASSESSMENT & PLAN:     1. COVID-19 - molnupiravir EUA (LAGEVRIO) 200 mg CAPS capsule; Take 4 capsules (800 mg total) by mouth 2 (two) times daily for 5 days.  Dispense: 40 capsule; Refill: 0  2. Other cough - benzonatate (TESSALON) 100 MG capsule; Take 1 capsule (100 mg total) by mouth 2 (two) times daily as needed for cough.  Dispense: 30 capsule; Refill: 0 - POC COVID-19-POSITIVE - POC Influenza A&B (Binax test)-NEGATIVE       Rest and push fluids Seek emergency medical care for severe symptoms or serious medical concerns Consider smoking cessation Follow-up as needed    COVID-19 Education: The signs and symptoms of COVID-19 were discussed with the patient and how to seek care for testing (follow up with PCP or arrange E-visit). The importance of social distancing was discussed today.   I spent 10 minutes dedicated to the care of this patient on the date of this encounter to include face-to-face time with the patient, as well as: EMR and prescription medication management.   Follow Up:  In Person prn  Signed, [name], DNP  05/16/2021 3:06 PM    [place]

## 2021-05-26 DIAGNOSIS — M818 Other osteoporosis without current pathological fracture: Secondary | ICD-10-CM | POA: Insufficient documentation

## 2021-05-26 DIAGNOSIS — K5903 Drug induced constipation: Secondary | ICD-10-CM | POA: Insufficient documentation

## 2021-05-26 NOTE — Assessment & Plan Note (Signed)
Continue Tymlos.  Management per endocrinology.

## 2021-05-26 NOTE — Assessment & Plan Note (Addendum)
Continue current medications.  Recommend quit smoking. Recommend regular follow-up with pulmonology.

## 2021-05-26 NOTE — Assessment & Plan Note (Signed)
Strongly recommend cessation.  Patient is not feel she is able to do this now.

## 2021-05-26 NOTE — Assessment & Plan Note (Signed)
Hopefully If pain becomes better controlled, her depression will improve.  The current medical regimen is fairly effective;  continue present plan and medications.

## 2021-05-26 NOTE — Assessment & Plan Note (Signed)
Continue Amitiza..  Well-controlled.

## 2021-06-01 ENCOUNTER — Telehealth: Payer: Self-pay

## 2021-06-01 NOTE — Chronic Care Management (AMB) (Signed)
Chronic Care Management Pharmacy Assistant   Name: Connie Coffey  MRN: [id number] DOB: 1956-05-23   Reason for Encounter: General Adherence Call    Recent office visits:  05/16/21 Connie Coffey. Video Visit. Seen for Covid. Started Benzonatate 100 mg 2 times daily prn and Molnupiravir 800 mg 2 times daily.   05/08/21 Connie Coffey. Seen for Osteporosis and HLD. No med changes.   Recent consult visits:  None   Hospital visits:  None   Medications: Outpatient Encounter Medications as of 06/01/2021  Medication Sig   Abaloparatide (TYMLOS) 3120 MCG/1.56ML SOPN Inject 80 mcg into the skin daily.   albuterol (VENTOLIN HFA) 108 (90 Base) MCG/ACT inhaler Inhale 2 puffs into the lungs every 4 (four) hours as needed.   atorvastatin (LIPITOR) 40 MG tablet TAKE 1 TABLET BY MOUTH  DAILY   benzonatate (TESSALON) 100 MG capsule Take 1 capsule (100 mg total) by mouth 2 (two) times daily as needed for cough.   bethanechol (URECHOLINE) 25 MG tablet Take 1 tablet (25 mg total) by mouth in the morning and at bedtime.   busPIRone (BUSPAR) 30 MG tablet TAKE 1 TABLET BY MOUTH 2 TIMES DAILY.   clonazePAM (KLONOPIN) 0.5 MG tablet TAKE 1 TABLET BY MOUTH TWICE A DAY   cyclobenzaprine (FLEXERIL) 10 MG tablet Take 1 tablet (10 mg total) by mouth 3 (three) times daily.   D-Mannose 500 MG CAPS Take by mouth.   diclofenac Sodium (VOLTAREN) 1 % GEL Apply 20 g topically 4 (four) times daily.   DULoxetine (CYMBALTA) 60 MG capsule Take 1 capsule (60 mg total) by mouth daily.   DYMISTA 137-50 MCG/ACT SUSP One spray each nostril twice a day.   fenofibrate 160 MG tablet Take 1 tablet (160 mg total) by mouth daily.   gabapentin (NEURONTIN) 600 MG tablet TAKE 1 TABLET BY MOUTH 4  TIMES DAILY   hydrocortisone (CORTEF) 20 MG tablet Take 1.5 tablets (30 mg total) by mouth daily. Take 1 tablet in the morning and 1/2 tablet at 4pm   Insulin Pen Needle (GLOBAL EASY GLIDE PEN NEEDLES) 32G X 4 MM MISC Use one needle  daily as directed   levocetirizine (XYZAL) 5 MG tablet TAKE 1 TABLET BY MOUTH EVERY DAY IN THE EVENING   levothyroxine (SYNTHROID) 50 MCG tablet TAKE 1 TABLET BY MOUTH IN  THE MORNING   lubiprostone (AMITIZA) 8 MCG capsule Take 1 capsule (8 mcg total) by mouth 2 (two) times daily with a meal.   montelukast (SINGULAIR) 10 MG tablet Take 1 tablet (10 mg total) by mouth daily.   Multiple Vitamin (MULTI-VITAMINS) TABS Take 1 tablet by mouth daily.   naloxone (NARCAN) nasal spray 4 mg/0.1 mL Place 1 spray into the nose once. (for suspected overdose)   omeprazole (PRILOSEC) 40 MG capsule Take 1 capsule (40 mg total) by mouth 2 (two) times daily.   oxyCODONE-acetaminophen (PERCOCET) 10-325 MG tablet TAKE 1 TAB EVERY 4 HOURS FOR 5 DAYS THEN 1 TAB TWICE A DAY AS NEEDED FOR SEVERE BREAKTHROUGH PAIN   primidone (MYSOLINE) 50 MG tablet Take 1 tablet (50 mg total) by mouth 2 (two) times daily.   Tiotropium Bromide Monohydrate (SPIRIVA RESPIMAT) 2.5 MCG/ACT AERS Inhale 1 puff into the lungs 2 (two) times daily.   Vitamin D, Ergocalciferol, (DRISDOL) 50000 units CAPS capsule Take 1 capsule by mouth once a week.   No facility-administered encounter medications on file as of 06/01/2021.   Contacted Millee G Fennimore for general disease state  and medication adherence call.   Patient is not > 5 days past due for refill on the following medications per chart history:  [facility] Medications: Medication Name/mg Last Fill Days Supply Atorvastatin 40 mg  05/31/21  90ds     03/06/21 90ds  What concerns do you have about your medications? Pt has no concerns   The patient denies side effects with her medications.   How often do you forget or accidentally miss a dose? Never  Do you use a pillbox? Yes  Are you having any problems getting your medications from your pharmacy? No  Has the cost of your medications been a concern? No  Since last visit with CPP, no interventions have been made:   The patient has not had an ED  visit since last contact.   The patient reports the following problems with their health. Pt stated when she leans her head back she blacks out for just a sec. Pt stated she saw the doctor but is going in for a procedure on Monday and will discuss this issue and see what she needs to do. She stated this started since in June when she fell and messed her back up. She stated this has only happened twice with the blackouts and her surgeon knows about this.   she denies  concerns or questions for [name], at this time.    Care Gaps: Last annual wellness visit?02/06/21 \\Mammogram : 04/07/21 Colonoscopy: 06/04/19 Dexa Scan: 04/06/20  Connie Coffey, [place] Pharmacist Assistant  [phone]

## 2021-06-03 DIAGNOSIS — N3000 Acute cystitis without hematuria: Secondary | ICD-10-CM | POA: Diagnosis not present

## 2021-06-04 ENCOUNTER — Other Ambulatory Visit: Payer: Self-pay | Admitting: Family Medicine

## 2021-06-05 ENCOUNTER — Telehealth: Payer: Self-pay

## 2021-06-05 DIAGNOSIS — M961 Postlaminectomy syndrome, not elsewhere classified: Secondary | ICD-10-CM | POA: Diagnosis not present

## 2021-06-05 DIAGNOSIS — G894 Chronic pain syndrome: Secondary | ICD-10-CM | POA: Diagnosis not present

## 2021-06-05 DIAGNOSIS — M5416 Radiculopathy, lumbar region: Secondary | ICD-10-CM | POA: Diagnosis not present

## 2021-06-05 DIAGNOSIS — N390 Urinary tract infection, site not specified: Secondary | ICD-10-CM | POA: Diagnosis not present

## 2021-06-05 NOTE — Telephone Encounter (Signed)
Connie Coffey called to report that she is scheduled for her spinal stimulator today at 2 pm.  She had a televisit over the weekend with the [employer] that her insurance recommended and was given Macrobid.  She talked with her urologist this morning and she is going to take a urine specimen in for evaluation.  She also is complaining of stomach irritation from drinking cranberry juice.  She is going to stop the juice and continue with lots of water.  She also is going to continue the prilosec.  She will recheck in the office if symptoms do not improve.

## 2021-06-12 DIAGNOSIS — M5416 Radiculopathy, lumbar region: Secondary | ICD-10-CM | POA: Diagnosis not present

## 2021-06-14 ENCOUNTER — Other Ambulatory Visit: Payer: Self-pay | Admitting: Pain Medicine

## 2021-06-14 DIAGNOSIS — M2578 Osteophyte, vertebrae: Secondary | ICD-10-CM | POA: Diagnosis not present

## 2021-06-14 DIAGNOSIS — Z981 Arthrodesis status: Secondary | ICD-10-CM | POA: Diagnosis not present

## 2021-06-16 ENCOUNTER — Telehealth: Payer: Self-pay

## 2021-06-16 NOTE — Telephone Encounter (Signed)
Holter Monitor results faxed to Dr. Davy Pique @ [phone].

## 2021-06-26 NOTE — Progress Notes (Signed)
I left a voice message for Janett Billow to ask Dr. Emi Holes to sign orders.

## 2021-06-30 ENCOUNTER — Ambulatory Visit (HOSPITAL_COMMUNITY): Admission: RE | Admit: 2021-06-30 | Payer: Medicare Other | Source: Home / Self Care | Admitting: Pain Medicine

## 2021-06-30 SURGERY — INSERTION, SPINAL CORD STIMULATOR, LUMBAR
Anesthesia: Monitor Anesthesia Care

## 2021-07-03 ENCOUNTER — Other Ambulatory Visit: Payer: Self-pay | Admitting: Legal Medicine

## 2021-07-11 ENCOUNTER — Telehealth: Payer: Self-pay

## 2021-07-11 NOTE — Chronic Care Management (AMB) (Signed)
Chronic Care Management Pharmacy Assistant   Name: Connie Coffey  MRN: [id number] DOB: 25-Sep-1955   Reason for Encounter: General Adherence Call    Office Visits None  Consults Visits None  Hospital Visits  None  Medications: Outpatient Encounter Medications as of 07/11/2021  Medication Sig   Abaloparatide (TYMLOS) 3120 MCG/1.56ML SOPN Inject 80 mcg into the skin daily.   albuterol (VENTOLIN HFA) 108 (90 Base) MCG/ACT inhaler Inhale 2 puffs into the lungs every 4 (four) hours as needed.   atorvastatin (LIPITOR) 40 MG tablet TAKE 1 TABLET BY MOUTH  DAILY   benzonatate (TESSALON) 100 MG capsule Take 1 capsule (100 mg total) by mouth 2 (two) times daily as needed for cough. (Patient not taking: Reported on 06/23/2021)   bethanechol (URECHOLINE) 25 MG tablet Take 1 tablet (25 mg total) by mouth in the morning and at bedtime.   busPIRone (BUSPAR) 30 MG tablet TAKE 1 TABLET BY MOUTH 2 TIMES DAILY.   clonazePAM (KLONOPIN) 0.5 MG tablet TAKE 1 TABLET BY MOUTH TWICE A DAY   cyclobenzaprine (FLEXERIL) 10 MG tablet Take 1 tablet (10 mg total) by mouth 3 (three) times daily.   D-Mannose 500 MG CAPS Take 500 mg by mouth in the morning and at bedtime.   diclofenac Sodium (VOLTAREN) 1 % GEL Apply 20 g topically 4 (four) times daily. (Patient taking differently: Apply 2 g topically 4 (four) times daily as needed (pain).)   DULoxetine (CYMBALTA) 60 MG capsule Take 1 capsule (60 mg total) by mouth daily.   DYMISTA 137-50 MCG/ACT SUSP One spray each nostril twice a day.   fenofibrate 160 MG tablet Take 1 tablet (160 mg total) by mouth daily.   gabapentin (NEURONTIN) 600 MG tablet TAKE 1 TABLET BY MOUTH 4  TIMES DAILY   guaiFENesin (MUCINEX) 600 MG 12 hr tablet Take 600 mg by mouth 2 (two) times daily as needed for cough or to loosen phlegm.   hydrocortisone (CORTEF) 10 MG tablet Take 10 mg by mouth 2 (two) times daily.   Insulin Pen Needle (GLOBAL EASY GLIDE PEN NEEDLES) 32G X 4 MM MISC Use one  needle daily as directed   levocetirizine (XYZAL) 5 MG tablet TAKE 1 TABLET BY MOUTH EVERY DAY IN THE EVENING   levothyroxine (SYNTHROID) 50 MCG tablet TAKE 1 TABLET BY MOUTH IN  THE MORNING   lubiprostone (AMITIZA) 8 MCG capsule Take 1 capsule (8 mcg total) by mouth 2 (two) times daily with a meal.   montelukast (SINGULAIR) 10 MG tablet Take 1 tablet (10 mg total) by mouth daily.   Multiple Vitamin (MULTI-VITAMINS) TABS Take 1 tablet by mouth daily.   nitrofurantoin, macrocrystal-monohydrate, (MACROBID) 100 MG capsule Take 100 mg by mouth daily.   olopatadine (PATANOL) 0.1 % ophthalmic solution Place 1 drop into both eyes daily as needed for allergies.   omeprazole (PRILOSEC) 40 MG capsule Take 1 capsule (40 mg total) by mouth 2 (two) times daily.   oxyCODONE-acetaminophen (PERCOCET) 10-325 MG tablet TAKE 1 TAB EVERY 4 HOURS FOR 5 DAYS THEN 1 TAB TWICE A DAY AS NEEDED FOR SEVERE BREAKTHROUGH PAIN   primidone (MYSOLINE) 50 MG tablet Take 1 tablet (50 mg total) by mouth 2 (two) times daily.   Propylene Glycol (SYSTANE BALANCE) 0.6 % SOLN Place 1 drop into both eyes daily as needed (dry eyes).   SPIRIVA RESPIMAT 2.5 MCG/ACT AERS USE 1 INHALATION BY MOUTH  TWICE DAILY   Vitamin D, Ergocalciferol, (DRISDOL) 50000 units CAPS capsule Take  50,000 Units by mouth every Wednesday.   No facility-administered encounter medications on file as of 07/11/2021.    Contacted Connie Coffey for general disease state and medication adherence call.   Patient is not > 5 days past due for refill on the following medications per chart history:  Star Medications: Medication Name/mg Last Fill Days Supply Atorvastatin 40 mg                  05/31/21              90ds                                                 03/06/21          90ds   What concerns do you have about your medications? Pt denies any problems  The patient denies side effects with her medications.   How often do you forget or accidentally miss a  dose? Never  Do you use a pillbox? Yes  Are you having any problems getting your medications from your pharmacy? No  Has the cost of your medications been a concern? No  Since last visit with CPP, no interventions have been made:   The patient has not had an ED visit since last contact.   The patient reports problems with their health. She's been sleeping a lot due to her arthritis and pain.  she denies  concerns or questions for Connie Coffey, at this time.   Blood Pressure Readings have been 100/70. Pt is doing well and will be having surgery on 07/21/21 and will schedule a follow up after that with CPP.    Care Gaps: Last annual wellness visit?02/06/21 \\Mammogram : 04/07/21 Colonoscopy: 06/04/19 Dexa Scan: 04/06/20  Connie Coffey, [place] Pharmacist Assistant  [phone]

## 2021-07-19 ENCOUNTER — Other Ambulatory Visit: Payer: Self-pay

## 2021-07-19 ENCOUNTER — Encounter (HOSPITAL_COMMUNITY): Payer: Self-pay | Admitting: Pain Medicine

## 2021-07-19 NOTE — Progress Notes (Signed)
PCP - Dr. Olivia Canter  Cardiologist - Denies  EP- Denies  Endocrine- Denies  Pulm- Denies  Chest x-ray - Denies  EKG - 10/28/20 (E)  Stress Test - Denies  ECHO - Denies  Cardiac Cath - Denies  AICD- na PM- na LOOP- na  Nerve Stimulator- Denies  Dialysis- Denies  Sleep Study - Denies CPAP - Denies  LABS- 07/21/21: CBC, PCR  ASA- Denies  ERAS- No  HA1C- Denies  Anesthesia- No  Pt denies having chest pain, sob, or fever during the pre-op phone call. All instructions explained to the pt, with a verbal understanding of the material including: as of today,  stop taking all Aspirin (unless instructed by your doctor) and Other Aspirin containing products, Vitamins, Fish oils, and Herbal medications. Also stop all NSAIDS i.e. Advil, Ibuprofen, Motrin, Aleve, Anaprox, Naproxen, BC, Goody Powders, and all Supplements.  Pt also instructed to wear a mask and social distance if she goes out. The opportunity to ask questions was provided.    Coronavirus Screening  Have you experienced the following symptoms:  Cough yes/no: No Fever (>100.87F)  yes/no: No Runny nose yes/no: No Sore throat yes/no: No Difficulty breathing/shortness of breath  yes/no: No  Have you or a family member traveled in the last 14 days and where? yes/no: No   If the patient indicates "YES" to the above questions, their PAT will be rescheduled to limit the exposure to others and, the surgeon will be notified. THE PATIENT WILL NEED TO BE ASYMPTOMATIC FOR 14 DAYS.   If the patient is not experiencing any of these symptoms, the PAT nurse will instruct them to NOT bring anyone with them to their appointment since they may have these symptoms or traveled as well.   Please remind your patients and families that hospital visitation restrictions are in effect and the importance of the restrictions.

## 2021-07-21 ENCOUNTER — Ambulatory Visit (HOSPITAL_COMMUNITY): Payer: Medicare Other

## 2021-07-21 ENCOUNTER — Encounter (HOSPITAL_COMMUNITY): Payer: Self-pay | Admitting: Pain Medicine

## 2021-07-21 ENCOUNTER — Other Ambulatory Visit: Payer: Self-pay

## 2021-07-21 ENCOUNTER — Encounter (HOSPITAL_COMMUNITY)
Admission: RE | Disposition: A | Discharge status: Home / Self Care | Payer: Self-pay | Source: Home / Self Care | Attending: Pain Medicine

## 2021-07-21 ENCOUNTER — Ambulatory Visit (HOSPITAL_BASED_OUTPATIENT_CLINIC_OR_DEPARTMENT_OTHER): Payer: Medicare Other | Admitting: Anesthesiology

## 2021-07-21 ENCOUNTER — Ambulatory Visit (HOSPITAL_COMMUNITY)
Admission: RE | Admit: 2021-07-21 | Discharge: 2021-07-21 | Disposition: A | Discharge status: Home / Self Care | Payer: Medicare Other | Attending: Pain Medicine | Admitting: Pain Medicine

## 2021-07-21 ENCOUNTER — Ambulatory Visit (HOSPITAL_COMMUNITY): Payer: Medicare Other | Admitting: Anesthesiology

## 2021-07-21 DIAGNOSIS — M961 Postlaminectomy syndrome, not elsewhere classified: Secondary | ICD-10-CM

## 2021-07-21 DIAGNOSIS — E119 Type 2 diabetes mellitus without complications: Secondary | ICD-10-CM

## 2021-07-21 DIAGNOSIS — G894 Chronic pain syndrome: Secondary | ICD-10-CM | POA: Diagnosis not present

## 2021-07-21 DIAGNOSIS — M5416 Radiculopathy, lumbar region: Secondary | ICD-10-CM

## 2021-07-21 DIAGNOSIS — M5135 Other intervertebral disc degeneration, thoracolumbar region: Secondary | ICD-10-CM | POA: Diagnosis not present

## 2021-07-21 DIAGNOSIS — Z419 Encounter for procedure for purposes other than remedying health state, unspecified: Secondary | ICD-10-CM

## 2021-07-21 DIAGNOSIS — J449 Chronic obstructive pulmonary disease, unspecified: Secondary | ICD-10-CM | POA: Insufficient documentation

## 2021-07-21 HISTORY — PX: SPINAL CORD STIMULATOR INSERTION: SHX5378

## 2021-07-21 HISTORY — DX: Depression, unspecified: F32.A

## 2021-07-21 LAB — BASIC METABOLIC PANEL
Anion gap: 8 (ref 5–15)
BUN: 13 mg/dL (ref 8–23)
CO2: 26 mmol/L (ref 22–32)
Calcium: 9.3 mg/dL (ref 8.9–10.3)
Chloride: 105 mmol/L (ref 98–111)
Creatinine, Ser: 0.85 mg/dL (ref 0.44–1.00)
GFR, Estimated: >60 mL/min (ref >60)
Glucose, Bld: 115 mg/dL — ABNORMAL HIGH (ref 70–99)
Potassium: 4 mmol/L (ref 3.5–5.1)
Sodium: 139 mmol/L (ref 135–145)

## 2021-07-21 LAB — CBC
HCT: 41.5 % (ref 36.0–46.0)
Hemoglobin: 13.4 g/dL (ref 12.0–15.0)
MCH: 29.5 pg (ref 26.0–34.0)
MCHC: 32.3 g/dL (ref 30.0–36.0)
MCV: 91.4 fL (ref 80.0–100.0)
Platelets: 429 10*3/uL — ABNORMAL HIGH (ref 150–400)
RBC: 4.54 MIL/uL (ref 3.87–5.11)
RDW: 14.5 % (ref 11.5–15.5)
WBC: 10.1 10*3/uL (ref 4.0–10.5)
nRBC: 0 % (ref 0.0–0.2)

## 2021-07-21 LAB — SURGICAL PCR SCREEN
MRSA, PCR: NEGATIVE
Staphylococcus aureus: NEGATIVE

## 2021-07-21 SURGERY — INSERTION, SPINAL CORD STIMULATOR, LUMBAR
Anesthesia: General | Site: Spine Lumbar

## 2021-07-21 MED ORDER — FENTANYL CITRATE (PF) 250 MCG/5ML IJ SOLN
INTRAMUSCULAR | Status: DC | PRN
  Administered 2021-07-21: 25 ug via INTRAVENOUS
  Administered 2021-07-21: 50 ug via INTRAVENOUS
  Administered 2021-07-21: 25 ug via INTRAVENOUS
  Administered 2021-07-21: 50 ug via INTRAVENOUS

## 2021-07-21 MED ORDER — FENTANYL CITRATE (PF) 250 MCG/5ML IJ SOLN
INTRAMUSCULAR | Status: AC
  Filled 2021-07-21: qty 5

## 2021-07-21 MED ORDER — PROPOFOL 10 MG/ML IV BOLUS
INTRAVENOUS | Status: AC
  Filled 2021-07-21: qty 20

## 2021-07-21 MED ORDER — ONDANSETRON HCL 4 MG/2ML IJ SOLN
INTRAMUSCULAR | Status: AC
  Filled 2021-07-21: qty 2

## 2021-07-21 MED ORDER — ONDANSETRON HCL 4 MG/2ML IJ SOLN
INTRAMUSCULAR | Status: DC | PRN
  Administered 2021-07-21: 4 mg via INTRAVENOUS

## 2021-07-21 MED ORDER — PHENYLEPHRINE 40 MCG/ML (10ML) SYRINGE FOR IV PUSH (FOR BLOOD PRESSURE SUPPORT)
PREFILLED_SYRINGE | INTRAVENOUS | Status: AC
  Filled 2021-07-21: qty 10

## 2021-07-21 MED ORDER — PROPOFOL 10 MG/ML IV BOLUS
INTRAVENOUS | Status: DC | PRN
  Administered 2021-07-21: 120 mg via INTRAVENOUS

## 2021-07-21 MED ORDER — SUGAMMADEX SODIUM 200 MG/2ML IV SOLN
INTRAVENOUS | Status: DC | PRN
  Administered 2021-07-21: 200 mg via INTRAVENOUS

## 2021-07-21 MED ORDER — LIDOCAINE 2% (20 MG/ML) 5 ML SYRINGE
INTRAMUSCULAR | Status: AC
  Filled 2021-07-21: qty 5

## 2021-07-21 MED ORDER — LACTATED RINGERS IV SOLN: INTRAVENOUS | Status: DC

## 2021-07-21 MED ORDER — LIDOCAINE 2% (20 MG/ML) 5 ML SYRINGE
INTRAMUSCULAR | Status: DC | PRN
Start: 2021-07-21 — End: 2021-07-21
  Administered 2021-07-21: 40 mg via INTRAVENOUS

## 2021-07-21 MED ORDER — 0.9 % SODIUM CHLORIDE (POUR BTL) OPTIME
TOPICAL | Status: DC | PRN
  Administered 2021-07-21: 1000 mL

## 2021-07-21 MED ORDER — OXYCODONE HCL 5 MG/5ML PO SOLN: 5.0000 mg | Freq: Once | ORAL | Status: AC | PRN

## 2021-07-21 MED ORDER — DEXAMETHASONE SODIUM PHOSPHATE 10 MG/ML IJ SOLN
INTRAMUSCULAR | Status: DC | PRN
  Administered 2021-07-21: 10 mg via INTRAVENOUS

## 2021-07-21 MED ORDER — BUPIVACAINE HCL 0.25 % IJ SOLN
INTRAMUSCULAR | Status: DC | PRN
  Administered 2021-07-21: 10 mL

## 2021-07-21 MED ORDER — ROCURONIUM BROMIDE 10 MG/ML (PF) SYRINGE
PREFILLED_SYRINGE | INTRAVENOUS | Status: DC | PRN
  Administered 2021-07-21: 50 mg via INTRAVENOUS

## 2021-07-21 MED ORDER — CEFAZOLIN SODIUM-DEXTROSE 2-3 GM-%(50ML) IV SOLR
INTRAVENOUS | Status: DC | PRN
  Administered 2021-07-21: 2 g via INTRAVENOUS

## 2021-07-21 MED ORDER — FENTANYL CITRATE (PF) 100 MCG/2ML IJ SOLN: 25.0000 ug | INTRAMUSCULAR | Status: DC | PRN

## 2021-07-21 MED ORDER — BUPIVACAINE HCL (PF) 0.25 % IJ SOLN
INTRAMUSCULAR | Status: AC
  Filled 2021-07-21: qty 30

## 2021-07-21 MED ORDER — PHENYLEPHRINE 40 MCG/ML (10ML) SYRINGE FOR IV PUSH (FOR BLOOD PRESSURE SUPPORT)
PREFILLED_SYRINGE | INTRAVENOUS | Status: DC | PRN
  Administered 2021-07-21 (×2): 40 ug via INTRAVENOUS

## 2021-07-21 MED ORDER — OXYCODONE HCL 5 MG PO TABS
ORAL_TABLET | ORAL | Status: AC
  Filled 2021-07-21: qty 1

## 2021-07-21 MED ORDER — MIDAZOLAM HCL 2 MG/2ML IJ SOLN
INTRAMUSCULAR | Status: AC
  Filled 2021-07-21: qty 2

## 2021-07-21 MED ORDER — ACETAMINOPHEN 325 MG PO TABS: 325.0000 mg | ORAL_TABLET | ORAL | Status: DC | PRN

## 2021-07-21 MED ORDER — MIDAZOLAM HCL 2 MG/2ML IJ SOLN
INTRAMUSCULAR | Status: DC | PRN
  Administered 2021-07-21: 1 mg via INTRAVENOUS

## 2021-07-21 MED ORDER — CHLORHEXIDINE GLUCONATE 0.12 % MT SOLN
OROMUCOSAL | Status: AC
Start: 2021-07-21 — End: 2021-07-21
  Administered 2021-07-21: 15 mL via OROMUCOSAL
  Filled 2021-07-21: qty 15

## 2021-07-21 MED ORDER — CHLORHEXIDINE GLUCONATE 0.12 % MT SOLN: 15.0000 mL | Freq: Once | OROMUCOSAL | Status: AC

## 2021-07-21 MED ORDER — CEFAZOLIN SODIUM 1 G IJ SOLR
INTRAMUSCULAR | Status: AC
  Filled 2021-07-21: qty 20

## 2021-07-21 MED ORDER — DEXAMETHASONE SODIUM PHOSPHATE 10 MG/ML IJ SOLN
INTRAMUSCULAR | Status: AC
  Filled 2021-07-21: qty 1

## 2021-07-21 MED ORDER — ACETAMINOPHEN 160 MG/5ML PO SOLN: 325.0000 mg | ORAL | Status: DC | PRN

## 2021-07-21 MED ORDER — OXYCODONE HCL 5 MG PO TABS
5.0000 mg | ORAL_TABLET | Freq: Once | ORAL | Status: AC | PRN
  Administered 2021-07-21: 5 mg via ORAL

## 2021-07-21 MED ORDER — AMISULPRIDE (ANTIEMETIC) 5 MG/2ML IV SOLN: 10.0000 mg | Freq: Once | INTRAVENOUS | Status: DC | PRN

## 2021-07-21 MED ORDER — ACETAMINOPHEN 10 MG/ML IV SOLN
INTRAVENOUS | Status: AC
  Filled 2021-07-21: qty 100

## 2021-07-21 MED ORDER — ACETAMINOPHEN 10 MG/ML IV SOLN
1000.0000 mg | Freq: Once | INTRAVENOUS | Status: DC | PRN
  Administered 2021-07-21: 1000 mg via INTRAVENOUS

## 2021-07-21 MED ORDER — ORAL CARE MOUTH RINSE: 15.0000 mL | Freq: Once | OROMUCOSAL | Status: AC

## 2021-07-21 MED ORDER — BACITRACIN-NEOMYCIN-POLYMYXIN OINTMENT TUBE
TOPICAL_OINTMENT | CUTANEOUS | Status: AC
  Filled 2021-07-21: qty 14.17

## 2021-07-21 MED ORDER — ROCURONIUM BROMIDE 10 MG/ML (PF) SYRINGE
PREFILLED_SYRINGE | INTRAVENOUS | Status: AC
  Filled 2021-07-21: qty 10

## 2021-07-21 SURGICAL SUPPLY — 62 items
ANCHOR CLICK (Anchor) ×1 IMPLANT
ANCHOR CLIK NEURO F/LEAD 2 (Anchor) IMPLANT
BAG COUNTER SPONGE SURGICOUNT (BAG) ×2 IMPLANT
BAG DECANTER FOR FLEXI CONT (MISCELLANEOUS) ×1 IMPLANT
BENZOIN TINCTURE PRP APPL 2/3 (GAUZE/BANDAGES/DRESSINGS) IMPLANT
BINDER ABDOMINAL 12 ML 46-62 (SOFTGOODS) ×2 IMPLANT
CHLORAPREP W/TINT 26 (MISCELLANEOUS) ×2 IMPLANT
CLEANER TIP ELECTROSURG 2X2 (MISCELLANEOUS) ×2 IMPLANT
CONTROL REMOTE FREELINK ALPHA (NEUROSURGERY SUPPLIES) ×1 IMPLANT
DERMABOND ADVANCED (GAUZE/BANDAGES/DRESSINGS) ×1
DERMABOND ADVANCED .7 DNX12 (GAUZE/BANDAGES/DRESSINGS) ×1 IMPLANT
DEVICE FIXATE SUTURING DUAL (MISCELLANEOUS) ×1 IMPLANT
DRAPE C-ARM 42X72 X-RAY (DRAPES) ×2 IMPLANT
DRAPE C-ARMOR (DRAPES) ×2 IMPLANT
DRAPE LAPAROTOMY 100X72X124 (DRAPES) ×2 IMPLANT
DRAPE SURG 17X23 STRL (DRAPES) ×8 IMPLANT
DRSG OPSITE POSTOP 3X4 (GAUZE/BANDAGES/DRESSINGS) IMPLANT
DRSG OPSITE POSTOP 4X6 (GAUZE/BANDAGES/DRESSINGS) ×2 IMPLANT
ELECT REM PT RETURN 9FT ADLT (ELECTROSURGICAL) ×2
ELECTRODE REM PT RTRN 9FT ADLT (ELECTROSURGICAL) ×1 IMPLANT
GAUZE 4X4 16PLY ~~LOC~~+RFID DBL (SPONGE) ×2 IMPLANT
GLOVE EXAM NITRILE XL STR (GLOVE) IMPLANT
GLOVE EXAM NITRILE XS STR PU (GLOVE) IMPLANT
GLOVE SRG 8 PF TXTR STRL LF DI (GLOVE) ×1 IMPLANT
GLOVE SURG ENC MOIS LTX SZ8 (GLOVE) ×2 IMPLANT
GLOVE SURG UNDER POLY LF SZ8 (GLOVE) ×2
GOWN STRL REUS W/ TWL LRG LVL3 (GOWN DISPOSABLE) IMPLANT
GOWN STRL REUS W/ TWL XL LVL3 (GOWN DISPOSABLE) IMPLANT
GOWN STRL REUS W/TWL 2XL LVL3 (GOWN DISPOSABLE) IMPLANT
GOWN STRL REUS W/TWL LRG LVL3 (GOWN DISPOSABLE)
GOWN STRL REUS W/TWL XL LVL3 (GOWN DISPOSABLE)
KIT BASIN OR (CUSTOM PROCEDURE TRAY) ×2 IMPLANT
KIT CHARGING (KITS) ×1
KIT CHARGING PRECISION NEURO (KITS) IMPLANT
KIT IPG ALPHA WAVEWRITER (Stimulator) ×1 IMPLANT
KIT LEAD 50CM (Lead) ×2 IMPLANT
KIT TURNOVER KIT B (KITS) ×2 IMPLANT
NDL 18GX1X1/2 (RX/OR ONLY) (NEEDLE) IMPLANT
NDL HYPO 25X1 1.5 SAFETY (NEEDLE) ×1 IMPLANT
NEEDLE 18GX1X1/2 (RX/OR ONLY) (NEEDLE) IMPLANT
NEEDLE HYPO 25X1 1.5 SAFETY (NEEDLE) ×2 IMPLANT
NS IRRIG 1000ML POUR BTL (IV SOLUTION) ×2 IMPLANT
PACK LAMINECTOMY NEURO (CUSTOM PROCEDURE TRAY) ×2 IMPLANT
PAD ARMBOARD 7.5X6 YLW CONV (MISCELLANEOUS) ×2 IMPLANT
SPONGE SURGIFOAM ABS GEL SZ50 (HEMOSTASIS) IMPLANT
SPONGE T-LAP 4X18 ~~LOC~~+RFID (SPONGE) ×2 IMPLANT
STAPLER SKIN PROX WIDE 3.9 (STAPLE) ×2 IMPLANT
STRIP CLOSURE SKIN 1/2X4 (GAUZE/BANDAGES/DRESSINGS) IMPLANT
SUT MNCRL AB 4-0 PS2 18 (SUTURE) IMPLANT
SUT SILK 0 (SUTURE) ×2
SUT SILK 0 MO-6 18XCR BRD 8 (SUTURE) ×1 IMPLANT
SUT SILK 0 TIES 10X30 (SUTURE) IMPLANT
SUT SILK 2 0 TIES 10X30 (SUTURE) IMPLANT
SUT VIC AB 2-0 CP2 18 (SUTURE) ×5 IMPLANT
SYR 10ML LL (SYRINGE) IMPLANT
SYR EPIDURAL 5ML GLASS (SYRINGE) ×2 IMPLANT
TOOL LONG TUNNEL (SPINAL CORD STIMULATOR) ×1 IMPLANT
TOWEL GREEN STERILE (TOWEL DISPOSABLE) ×2 IMPLANT
TOWEL GREEN STERILE FF (TOWEL DISPOSABLE) ×2 IMPLANT
WATER STERILE IRR 1000ML POUR (IV SOLUTION) ×2 IMPLANT
WRENCH HEX 7.6 (SPINAL CORD STIMULATOR) ×1 IMPLANT
YANKAUER SUCT BULB TIP NO VENT (SUCTIONS) ×2 IMPLANT

## 2021-07-21 NOTE — Discharge Instructions (Addendum)
Given Oxycodone IR 5mg  at 9:41 am

## 2021-07-21 NOTE — Transfer of Care (Signed)
Immediate Anesthesia Transfer of Care Note  Patient: Connie Coffey  Procedure(s) Performed: Permanant Spinal cord stimulator placement leads and battery under fluroscopy (Spine Lumbar)  Patient Location: PACU  Anesthesia Type:General  Level of Consciousness: awake  Airway & Oxygen Therapy: Patient Spontanous Breathing and Patient connected to face mask oxygen  Post-op Assessment: Report given to RN and Post -op Vital signs reviewed and stable  Post vital signs: Reviewed and stable  Last Vitals:  Vitals Value Taken Time  BP 103/59 07/21/21 0919  Temp    Pulse 78 07/21/21 0924  Resp 13 07/21/21 0924  SpO2 95 % 07/21/21 0924  Vitals shown include unvalidated device data.  Last Pain:  Vitals:   07/21/21 0617  TempSrc:   PainSc: 4       Patients Stated Pain Goal: 2 (71/69/67 8938)  Complications: No notable events documented.

## 2021-07-21 NOTE — Anesthesia Preprocedure Evaluation (Addendum)
Anesthesia Evaluation  Patient identified by MRN, date of birth, ID band Patient awake    Reviewed: Allergy & Precautions, NPO status , Patient's Chart, lab work & pertinent test results  Airway Mallampati: II  TM Distance: <3 FB Neck ROM: Limited    Dental  (+) Edentulous Upper, Missing,    Pulmonary COPD, Current Smoker and Patient abstained from smoking.,     + decreased breath sounds      Cardiovascular + Valvular Problems/Murmurs  Rhythm:Regular Rate:Normal     Neuro/Psych PSYCHIATRIC DISORDERS Anxiety Depression  Neuromuscular disease CVA    GI/Hepatic Neg liver ROS, GERD  Medicated,  Endo/Other  diabetesHypothyroidism   Renal/GU negative Renal ROS     Musculoskeletal  (+) Arthritis , Fibromyalgia -  Abdominal   Peds  Hematology   Anesthesia Other Findings   Reproductive/Obstetrics                            Anesthesia Physical Anesthesia Plan  ASA: 3  Anesthesia Plan: General   Post-op Pain Management:    Induction: Intravenous  PONV Risk Score and Plan: 3 and Ondansetron, Midazolam and Dexamethasone  Airway Management Planned: Oral ETT  Additional Equipment: None  Intra-op Plan:   Post-operative Plan: Extubation in OR  Informed Consent: I have reviewed the patients History and Physical, chart, labs and discussed the procedure including the risks, benefits and alternatives for the proposed anesthesia with the patient or authorized representative who has indicated his/her understanding and acceptance.     Dental advisory given  Plan Discussed with: CRNA  Anesthesia Plan Comments:       Anesthesia Quick Evaluation

## 2021-07-21 NOTE — Anesthesia Procedure Notes (Signed)
Procedure Name: Intubation Date/Time: 07/21/2021 7:47 AM Performed by: Lorie Phenix, CRNA Pre-anesthesia Checklist: Patient identified, Emergency Drugs available, Suction available and Patient being monitored Patient Re-evaluated:Patient Re-evaluated prior to induction Oxygen Delivery Method: Circle system utilized Preoxygenation: Pre-oxygenation with 100% oxygen Induction Type: IV induction Ventilation: Mask ventilation without difficulty Laryngoscope Size: Mac and 3 Grade View: Grade I Tube type: Oral Tube size: 7.5 mm Number of attempts: 1 Airway Equipment and Method: Stylet and Oral airway Placement Confirmation: ETT inserted through vocal cords under direct vision, positive ETCO2 and breath sounds checked- equal and bilateral Secured at: 22 cm Tube secured with: Tape Dental Injury: Teeth and Oropharynx as per pre-operative assessment

## 2021-07-21 NOTE — Op Note (Signed)
Preop Diagnosis:   Lumbar post laminectomy syndrome Lumbar radiculopathy Chronic Pain syndrome  Postop Diagnosis:  Same  Procedure Performed:   Percutaneous permanent spinal cord stimulator lead implant X 2 under flouroscopy Spinal cord stimulator battery implant  Surgeon:  Davy Pique  Anesthesia:  General  Procedure in detail:  Patient greeted in holding area.  Questions answered, consent obtained.  Patient taken to OR an placed under general anesthesia, then placed prone on OR table.  All pressure points padded.  2 gms ancef given.  Sterile prep and drape in usual fashion.  Under flouroscopy, T12-L1 interspace identified.  A 2 inch linear region just inferior to this level was anesthetized with 0.25% bupivicaine.  An incision was made.  Dissection carried to supraspinous ligament.  Under direct flouroscopy combine with a loss of resistance to air technique, the T12-L1 epidural space was accessed.  Next, an 8 contact [employer] lead was threaded to the top of T8 at midline.  Lateral view demonstrated posterior lead placement.  A second lead was then placed in an identical fashion, just to the right of midline.  Lead placemend was confirmed in AP and lateral views.  Needles and stylettes were removed.  Each lead was then anchored using stayfix device.  Final imaging noted no migration.  Attention was then directed to the right flank.  A 2 inch linear region was anesthetized with 0.25% bupivicaine.  Incision made and pocket created.  The leads were then tunneled from the midline to flank incision using a tunneling device.  Leads were then secured to battery with torque wrench.  All parameters were deemed appropriate by our [employer].  Each pocket was then irrigated, hemostasis achieved.  Each pocket closed with series of 2-0 vicryl sutures followed by staples and sterile dressing.  Implants  [employer] 8 contact lead x 2 [employer] Regulatory affairs officer  anchor x 2  Complications  None  Fluids  Per anesthesia  EBL  Minimal  Disposition  PACU, then home with  driver.  Clinic follow up in 5 days.

## 2021-07-21 NOTE — H&P (Signed)
Cc: My leg hurts  HPI:  Patient with history of right leg pain, here for permanent spinal cord stimulator implant.  Her trial provided her with over 50% benefit.  Pain has returned to over 5/10.  PMH:  COPD,DM, Anxiety, Depression  Allergies: ? PCN ("fluid in lungs" multiple years ago, no hives/swelling, has taken amoxicillin), NSAIDS  Meds: reviewed in MAR  FH: non contributory  Soc: +tob  ROS: no fevers, no recent illness  PE:  Vitals reviewed in [facility] Alert, appropriate Good strength in LE thoughout  A/P  66 yo female for permanent SCS implant   Implant today.  Risks discussed (nerve damage, lead failure, infection, risks of anesthesia). Patient would like to proceed.  She will follow up in 5 days for dressing change.

## 2021-07-21 NOTE — Anesthesia Postprocedure Evaluation (Signed)
Anesthesia Post Note  Patient: Connie Coffey  Procedure(s) Performed: Permanant Spinal cord stimulator placement leads and battery under fluroscopy (Spine Lumbar)     Patient location during evaluation: PACU Anesthesia Type: General Level of consciousness: awake and alert Pain management: pain level controlled Vital Signs Assessment: post-procedure vital signs reviewed and stable Respiratory status: spontaneous breathing, nonlabored ventilation, respiratory function stable and patient connected to nasal cannula oxygen Cardiovascular status: blood pressure returned to baseline and stable Postop Assessment: no apparent nausea or vomiting Anesthetic complications: no   No notable events documented.  Last Vitals:  Vitals:   07/21/21 0939 07/21/21 0941  BP:    Pulse: 72 72  Resp: 13 11  Temp:    SpO2: 93% 95%                 Effie Berkshire

## 2021-07-25 ENCOUNTER — Encounter (HOSPITAL_COMMUNITY): Payer: Self-pay | Admitting: Pain Medicine

## 2021-08-03 ENCOUNTER — Telehealth: Payer: Self-pay

## 2021-08-03 NOTE — Chronic Care Management (AMB) (Signed)
? ? ?Chronic Care Management ?Pharmacy Assistant  ? ?Name: Connie Coffey  MRN: [id number] DOB: [birth year] ? ? ?Reason for Encounter: General Adherence Call  ?  ?Recent office visits:  ?None ? ?Recent consult visits:  ?None ? ?Hospital visits:  ?Medication Reconciliation was completed by comparing discharge summary, patient?s EMR and Pharmacy list, and upon discussion with patient. ? ?Admitted to the hospital on 07/21/21 due to Spinal Cord Stimulator Surgery. Discharge date was 07/21/21. Discharged from [facility].   ? ?Medications remain the same after Hospital Discharge:??  ? ?Medications: ?Outpatient Encounter Medications as of 08/03/2021  ?Medication Sig  ? Abaloparatide (TYMLOS) 3120 MCG/1.56ML SOPN Inject 80 mcg into the skin daily.  ? albuterol (VENTOLIN HFA) 108 (90 Base) MCG/ACT inhaler Inhale 2 puffs into the lungs every 4 (four) hours as needed.  ? atorvastatin (LIPITOR) 40 MG tablet TAKE 1 TABLET BY MOUTH  DAILY  ? benzonatate (TESSALON) 100 MG capsule Take 1 capsule (100 mg total) by mouth 2 (two) times daily as needed for cough. (Patient not taking: Reported on 06/23/2021)  ? bethanechol (URECHOLINE) 25 MG tablet Take 1 tablet (25 mg total) by mouth in the morning and at bedtime.  ? busPIRone (BUSPAR) 30 MG tablet TAKE 1 TABLET BY MOUTH 2 TIMES DAILY.  ? clonazePAM (KLONOPIN) 0.5 MG tablet TAKE 1 TABLET BY MOUTH TWICE A DAY  ? cyclobenzaprine (FLEXERIL) 10 MG tablet Take 1 tablet (10 mg total) by mouth 3 (three) times daily.  ? D-Mannose 500 MG CAPS Take 500 mg by mouth in the morning and at bedtime.  ? diclofenac Sodium (VOLTAREN) 1 % GEL Apply 20 g topically 4 (four) times daily. (Patient taking differently: Apply 2 g topically 4 (four) times daily as needed (pain).)  ? DULoxetine (CYMBALTA) 60 MG capsule Take 1 capsule (60 mg total) by mouth daily.  ? DYMISTA 137-50 MCG/ACT SUSP One spray each nostril twice a day.  ? fenofibrate 160 MG tablet Take 1 tablet (160 mg total) by mouth daily.  ?  gabapentin (NEURONTIN) 600 MG tablet TAKE 1 TABLET BY MOUTH 4  TIMES DAILY  ? guaiFENesin (MUCINEX) 600 MG 12 hr tablet Take 600 mg by mouth 2 (two) times daily as needed for cough or to loosen phlegm.  ? hydrocortisone (CORTEF) 10 MG tablet Take 10 mg by mouth 2 (two) times daily.  ? Insulin Pen Needle (GLOBAL EASY GLIDE PEN NEEDLES) 32G X 4 MM MISC Use one needle daily as directed  ? levocetirizine (XYZAL) 5 MG tablet TAKE 1 TABLET BY MOUTH EVERY DAY IN THE EVENING  ? levothyroxine (SYNTHROID) 50 MCG tablet TAKE 1 TABLET BY MOUTH IN  THE MORNING  ? lubiprostone (AMITIZA) 8 MCG capsule Take 1 capsule (8 mcg total) by mouth 2 (two) times daily with a meal.  ? montelukast (SINGULAIR) 10 MG tablet Take 1 tablet (10 mg total) by mouth daily.  ? Multiple Vitamin (MULTI-VITAMINS) TABS Take 1 tablet by mouth daily.  ? nitrofurantoin, macrocrystal-monohydrate, (MACROBID) 100 MG capsule Take 100 mg by mouth daily.  ? olopatadine (PATANOL) 0.1 % ophthalmic solution Place 1 drop into both eyes daily as needed for allergies.  ? omeprazole (PRILOSEC) 40 MG capsule Take 1 capsule (40 mg total) by mouth 2 (two) times daily.  ? oxyCODONE-acetaminophen (PERCOCET) 10-325 MG tablet TAKE 1 TAB EVERY 4 HOURS FOR 5 DAYS THEN 1 TAB TWICE A DAY AS NEEDED FOR SEVERE BREAKTHROUGH PAIN  ? primidone (MYSOLINE) 50 MG tablet Take 1 tablet (  50 mg total) by mouth 2 (two) times daily.  ? Propylene Glycol (SYSTANE BALANCE) 0.6 % SOLN Place 1 drop into both eyes daily as needed (dry eyes).  ? SPIRIVA RESPIMAT 2.5 MCG/ACT AERS USE 1 INHALATION BY MOUTH  TWICE DAILY  ? Vitamin D, Ergocalciferol, (DRISDOL) 50000 units CAPS capsule Take 50,000 Units by mouth every Wednesday.  ? ?No facility-administered encounter medications on file as of 08/03/2021.  ? ? ?Contacted Diera G Kahler for general disease state and medication adherence call.  ? ?Patient is not > 5 days past due for refill on the following medications per chart history: ? ?[facility]  Medications: ?Medication Name/mg Last Fill Days Supply ?Atorvastatin 40 mg                  05/31/21              90ds ?                                                03/06/21          90ds ?  ? ?What concerns do you have about your medications?Pt denies any concerns  ? ?The patient denies side effects with her medications.  ? ?How often do you forget or accidentally miss a dose? Never ? ?Do you use a pillbox? Yes ? ?Are you having any problems getting your medications from your pharmacy? No ? ?Has the cost of your medications been a concern? No ? ?Since last visit with CPP, no interventions have been made:  ? ?The patient has had an ED visit since last contact. Had spinal cord stimulator placement surgery. Pt stated it went well and she is doing fine but having to adjust to the feeling of it.  ? ?The patient denies problems with their health.  ? ?she denies  concerns or questions for [name], at this time.  ? ?Blood Pressure 110/70 on 08/03/21 ? ? ?Care Gaps: ?Last annual wellness visit?02/06/21 ?\Mammogram: 04/07/21 ?Colonoscopy: 06/04/19 ?Dexa Scan: 04/06/20 ? ?[name], CMA ?Clinical Pharmacist Assistant  ?[phone]  ?

## 2021-08-06 ENCOUNTER — Other Ambulatory Visit: Payer: Self-pay | Admitting: Family Medicine

## 2021-08-07 ENCOUNTER — Encounter: Payer: Self-pay | Admitting: Family Medicine

## 2021-08-07 ENCOUNTER — Ambulatory Visit (INDEPENDENT_AMBULATORY_CARE_PROVIDER_SITE_OTHER): Payer: Medicare Other | Admitting: Family Medicine

## 2021-08-07 VITALS — BP 106/70 | HR 100 | Temp 95.3°F | Resp 18 | Ht 61.0 in | Wt 142.0 lb

## 2021-08-07 DIAGNOSIS — M810 Age-related osteoporosis without current pathological fracture: Secondary | ICD-10-CM | POA: Diagnosis not present

## 2021-08-07 DIAGNOSIS — T402X5A Adverse effect of other opioids, initial encounter: Secondary | ICD-10-CM

## 2021-08-07 DIAGNOSIS — K219 Gastro-esophageal reflux disease without esophagitis: Secondary | ICD-10-CM

## 2021-08-07 DIAGNOSIS — T380X5A Adverse effect of glucocorticoids and synthetic analogues, initial encounter: Secondary | ICD-10-CM

## 2021-08-07 DIAGNOSIS — E039 Hypothyroidism, unspecified: Secondary | ICD-10-CM

## 2021-08-07 DIAGNOSIS — E271 Primary adrenocortical insufficiency: Secondary | ICD-10-CM | POA: Diagnosis not present

## 2021-08-07 DIAGNOSIS — F17218 Nicotine dependence, cigarettes, with other nicotine-induced disorders: Secondary | ICD-10-CM | POA: Diagnosis not present

## 2021-08-07 DIAGNOSIS — F1120 Opioid dependence, uncomplicated: Secondary | ICD-10-CM

## 2021-08-07 DIAGNOSIS — F330 Major depressive disorder, recurrent, mild: Secondary | ICD-10-CM

## 2021-08-07 DIAGNOSIS — E782 Mixed hyperlipidemia: Secondary | ICD-10-CM | POA: Diagnosis not present

## 2021-08-07 DIAGNOSIS — D75839 Thrombocytosis, unspecified: Secondary | ICD-10-CM | POA: Diagnosis not present

## 2021-08-07 DIAGNOSIS — K5903 Drug induced constipation: Secondary | ICD-10-CM

## 2021-08-07 DIAGNOSIS — J410 Simple chronic bronchitis: Secondary | ICD-10-CM | POA: Diagnosis not present

## 2021-08-07 DIAGNOSIS — M818 Other osteoporosis without current pathological fracture: Secondary | ICD-10-CM | POA: Diagnosis not present

## 2021-08-07 DIAGNOSIS — Z6826 Body mass index (BMI) 26.0-26.9, adult: Secondary | ICD-10-CM

## 2021-08-07 DIAGNOSIS — E559 Vitamin D deficiency, unspecified: Secondary | ICD-10-CM

## 2021-08-07 DIAGNOSIS — F331 Major depressive disorder, recurrent, moderate: Secondary | ICD-10-CM

## 2021-08-07 NOTE — Progress Notes (Unsigned)
Subjective:  Patient ID: Connie Coffey, female    DOB: Dec 25, 1955  Age: 66 y.o. MRN: [id number]  No chief complaint on file.  HPI: Chronic back pain: spine stimulator is helping. Follow up with Dr. Davy Pique. Pain clinic: on percocet 10/325 mg four times a day as needed, gabapentin 600 mg four times a day, cyclobenzaprine 10 mg three times a day,   Hyperlipidemia: on lipitor 40 mg daily and fenofibrate 160 mg daily.  Depression with anxiety: well controlled on buspirone 30 mg twice daily, duloxetine 60 mg once daily, and buspirone 30 mg twice daily, and clonazepam 0.5 mg twice daily. Sees psychiatry.   Vitamin D deficiency: Vitamin D 50K weekly.   Endocrinology manages hypothyroidism, adrenal insufficiency, and osteoporosis.  Hypothyroidism: on synthroid 50 mcg once daily in am.  Adrenal insufficiency: on cortef 30 mg daily.   GERD: on omeprazole 40 mg one twice daily.  Chronic UTIs: takes macrobid 100 mg once daily. Patient sees Clare Gandy, NP.  COPD: on singulair 10 mg before bed, spiriva respimat 2.5 mg one inhalation twice daily.      Current Outpatient Medications on File Prior to Visit  Medication Sig Dispense Refill   Abaloparatide (TYMLOS) 3120 MCG/1.56ML SOPN Inject 80 mcg into the skin daily. 4.68 mL 1   albuterol (VENTOLIN HFA) 108 (90 Base) MCG/ACT inhaler Inhale 2 puffs into the lungs every 4 (four) hours as needed. 18 g 2   atorvastatin (LIPITOR) 40 MG tablet TAKE 1 TABLET BY MOUTH  DAILY 90 tablet 3   benzonatate (TESSALON) 100 MG capsule Take 1 capsule (100 mg total) by mouth 2 (two) times daily as needed for cough. (Patient not taking: Reported on 06/23/2021) 30 capsule 0   bethanechol (URECHOLINE) 25 MG tablet Take 1 tablet (25 mg total) by mouth in the morning and at bedtime. 180 tablet 1   busPIRone (BUSPAR) 30 MG tablet TAKE 1 TABLET BY MOUTH 2 TIMES DAILY. 180 tablet 1   clonazePAM (KLONOPIN) 0.5 MG tablet TAKE 1 TABLET BY MOUTH TWICE A DAY 60 tablet 1    cyclobenzaprine (FLEXERIL) 10 MG tablet Take 1 tablet (10 mg total) by mouth 3 (three) times daily. 270 tablet 1   D-Mannose 500 MG CAPS Take 500 mg by mouth in the morning and at bedtime.     diclofenac Sodium (VOLTAREN) 1 % GEL Apply 20 g topically 4 (four) times daily. (Patient taking differently: Apply 2 g topically 4 (four) times daily as needed (pain).) 20 g 0   DULoxetine (CYMBALTA) 60 MG capsule Take 1 capsule (60 mg total) by mouth daily. 180 capsule 1   DYMISTA 137-50 MCG/ACT SUSP One spray each nostril twice a day. 23 g 3   fenofibrate 160 MG tablet Take 1 tablet (160 mg total) by mouth daily. 90 tablet 1   gabapentin (NEURONTIN) 600 MG tablet TAKE 1 TABLET BY MOUTH 4  TIMES DAILY 360 tablet 3   guaiFENesin (MUCINEX) 600 MG 12 hr tablet Take 600 mg by mouth 2 (two) times daily as needed for cough or to loosen phlegm.     hydrocortisone (CORTEF) 10 MG tablet Take 10 mg by mouth 2 (two) times daily.     Insulin Pen Needle (GLOBAL EASY GLIDE PEN NEEDLES) 32G X 4 MM MISC Use one needle daily as directed     levocetirizine (XYZAL) 5 MG tablet TAKE 1 TABLET BY MOUTH EVERY DAY IN THE EVENING 90 tablet 1   levothyroxine (SYNTHROID) 50 MCG tablet TAKE 1  TABLET BY MOUTH IN  THE MORNING 90 tablet 3   lubiprostone (AMITIZA) 8 MCG capsule Take 1 capsule (8 mcg total) by mouth 2 (two) times daily with a meal. 180 capsule 1   montelukast (SINGULAIR) 10 MG tablet Take 1 tablet (10 mg total) by mouth daily. 90 tablet 3   Multiple Vitamin (MULTI-VITAMINS) TABS Take 1 tablet by mouth daily.     nitrofurantoin, macrocrystal-monohydrate, (MACROBID) 100 MG capsule Take 100 mg by mouth daily.     olopatadine (PATANOL) 0.1 % ophthalmic solution Place 1 drop into both eyes daily as needed for allergies.     omeprazole (PRILOSEC) 40 MG capsule Take 1 capsule (40 mg total) by mouth 2 (two) times daily. 180 capsule 3   oxyCODONE-acetaminophen (PERCOCET) 10-325 MG tablet TAKE 1 TAB EVERY 4 HOURS FOR 5 DAYS THEN 1  TAB TWICE A DAY AS NEEDED FOR SEVERE BREAKTHROUGH PAIN  0   primidone (MYSOLINE) 50 MG tablet Take 1 tablet (50 mg total) by mouth 2 (two) times daily. 180 tablet 1   Propylene Glycol (SYSTANE BALANCE) 0.6 % SOLN Place 1 drop into both eyes daily as needed (dry eyes).     SPIRIVA RESPIMAT 2.5 MCG/ACT AERS USE 1 INHALATION BY MOUTH  TWICE DAILY 12 g 3   Vitamin D, Ergocalciferol, (DRISDOL) 50000 units CAPS capsule Take 50,000 Units by mouth every Wednesday.     No current facility-administered medications on file prior to visit.   Past Medical History:  Diagnosis Date   Adrenal insufficiency (HCC)    Anxiety    Arthritis    Carpal tunnel syndrome    Both hands, worse in right   Chronic constipation    COPD (chronic obstructive pulmonary disease) (HCC)    CVA (cerebral vascular accident) ([place])    Depression    Facet syndrome    Fibromyalgia    GERD (gastroesophageal reflux disease)    Heart murmur    Hypercholesteremia    IBS (irritable bowel syndrome)    Ischemic stroke ([place])    07/19/2004   Lumbar radiculopathy    Osteoporosis    RLS (restless legs syndrome)    Spinal stenosis    TMJ (temporomandibular joint disorder)    Trochanteric bursitis of both hips    Vascular malformation    spinal cord   Past Surgical History:  Procedure Laterality Date   APPENDECTOMY     CARPAL TUNNEL RELEASE Right 12/13/2017   surgery   CHOLECYSTECTOMY     COLONOSCOPY  05/06/2017   Colonic polyps status post polypectomy. Interan land external hemorrhoids.    ESOPHAGOGASTRODUODENOSCOPY  04/28/2007   Mild gastrtiis. Otherwise normal EGD.    HEMORROIDECTOMY     LAMINECTOMY AND MICRODISCECTOMY LUMBAR SPINE  01/03/2005   NECK SURGERY     x2    OTHER SURGICAL HISTORY  04/24/2004   anterior cervical discectomy and fusion at C5-C6   SPINAL CORD STIMULATOR INSERTION N/A 07/21/2021   Procedure: Permanant Spinal cord stimulator placement leads and battery under fluroscopy;  Surgeon: Reece Agar, MD;  Location: [place];  Service: Neurosurgery;  Laterality: N/A;   TONSILLECTOMY     TOTAL ABDOMINAL HYSTERECTOMY      Family History  Problem Relation Age of Onset   Supraventricular tachycardia Mother    Dementia Mother    Hyperlipidemia Mother    Hypertension Mother    Osteoarthritis Mother    Osteoporosis Mother    Breast cancer Other    Breast cancer Other  Colon cancer Neg Hx    Esophageal cancer Neg Hx    Social History   Socioeconomic History   Marital status: Divorced    Spouse name: Not on file   Number of children: 1   Years of education: Not on file   Highest education level: Not on file  Occupational History   Occupation: Disabled  Tobacco Use   Smoking status: Every Day    Packs/day: 1.00    Types: Cigarettes   Smokeless tobacco: Never  Vaping Use   Vaping Use: Never used  Substance and Sexual Activity   Alcohol use: Not Currently    Comment: quit 19 years   Drug use: Never   Sexual activity: Not Currently  Other Topics Concern   Not on file  Social History Narrative   Disabled son lives with her sometimes and his father sometimes.  Jahari is working hard to have a relationship with her son's father and his wife.  They help her when needed.   Social Determinants of Health   Financial Resource Strain: Not on file  Food Insecurity: Not on file  Transportation Needs: Unmet Transportation Needs   Lack of Transportation (Medical): Yes   Lack of Transportation (Non-Medical): Yes  Physical Activity: Not on file  Stress: Not on file  Social Connections: Not on file    Review of Systems  Constitutional:  Negative for appetite change, fatigue and fever.  HENT:  Negative for congestion, ear pain, sinus pressure and sore throat.   Respiratory:  Positive for cough and shortness of breath. Negative for chest tightness and wheezing.   Cardiovascular:  Negative for chest pain and palpitations.  Gastrointestinal:  Positive for constipation (controlled  w/current medications). Negative for abdominal pain, diarrhea, nausea and vomiting.  Genitourinary:  Negative for dysuria and hematuria.  Musculoskeletal:  Positive for back pain. Negative for arthralgias, joint swelling and myalgias.  Skin:  Negative for rash.  Neurological:  Negative for dizziness, weakness and headaches.  Psychiatric/Behavioral:  Negative for dysphoric mood. The patient is not nervous/anxious.     Objective:  There were no vitals taken for this visit.  BP/Weight 07/21/2021 07/19/2021 31/51/7616  Systolic BP 073 - 710  Diastolic BP 82 - 76  Wt. (Lbs) - 135 150.6  BMI 23.91 - 28.46    Physical Exam Vitals reviewed.  Constitutional:      Appearance: Normal appearance. She is normal weight.  Neck:     Vascular: No carotid bruit.  Cardiovascular:     Rate and Rhythm: Normal rate and regular rhythm.     Heart sounds: Normal heart sounds.  Pulmonary:     Effort: Pulmonary effort is normal. No respiratory distress.     Breath sounds: Wheezing present.  Abdominal:     General: Abdomen is flat. Bowel sounds are normal.     Palpations: Abdomen is soft.     Tenderness: There is no abdominal tenderness.  Neurological:     Mental Status: She is alert and oriented to person, place, and time.  Psychiatric:        Mood and Affect: Mood normal.        Behavior: Behavior normal.    Diabetic Foot Exam - Simple   No data filed      Lab Results  Component Value Date   WBC 10.1 07/21/2021   HGB 13.4 07/21/2021   HCT 41.5 07/21/2021   PLT 429 (H) 07/21/2021   GLUCOSE 115 (H) 07/21/2021   CHOL 162 05/08/2021  TRIG 121 05/08/2021   HDL 72 05/08/2021   LDLCALC 69 05/08/2021   ALT 13 05/08/2021   AST 19 05/08/2021   NA 139 07/21/2021   K 4.0 07/21/2021   CL 105 07/21/2021   CREATININE 0.85 07/21/2021   BUN 13 07/21/2021   CO2 26 07/21/2021   TSH 1.550 02/01/2021      Assessment & Plan:   Problem List Items Addressed This Visit       Respiratory    Simple chronic bronchitis (HCC)     Digestive   Therapeutic opioid induced constipation     Endocrine   Acquired hypothyroidism - Primary   Adrenal insufficiency (Addison's disease) ([place])     Musculoskeletal and Integument   Steroid-induced osteoporosis     Other   Moderate recurrent major depression ([place])   Vitamin D deficiency   Mixed hyperlipidemia   Other Visit Diagnoses     Thrombocythemia       Cigarette nicotine dependence with other nicotine-induced disorder       Age-related osteoporosis without current pathological fracture         .  Follow-up: No follow-ups on file.  An After Visit Summary was printed and given to the patient.  Total time spent on today's visit was greater than 30 minutes, including both face-to-face time and nonface-to-face time personally spent on review of chart (labs and imaging), discussing labs and goals, discussing further work-up, treatment options, referrals to specialist if needed, reviewing outside records of pertinent, answering patient's questions, and coordinating care.   I,Lauren M Auman,acting as a scribe for [name], MD.,have documented all relevant documentation on the behalf of [name], MD,as directed by  [name], MD while in the presence of [name], MD.    [name], MD [place] [phone]

## 2021-08-07 NOTE — Assessment & Plan Note (Signed)
Recommend decrease omeprazole 40 mg daily. If reflux comes back, please call the office and we will add pepcid to your omeprazole once daily. ?

## 2021-08-07 NOTE — Patient Instructions (Addendum)
Recommend decrease omeprazole 40 mg daily. If reflux comes back, please call the office and we will add pepcid to your omeprazole once daily. ?

## 2021-08-08 ENCOUNTER — Other Ambulatory Visit: Payer: Self-pay

## 2021-08-08 LAB — COMPREHENSIVE METABOLIC PANEL
ALT: 14 IU/L (ref 0–32)
AST: 18 IU/L (ref 0–40)
Albumin/Globulin Ratio: 2.2 (ref 1.2–2.2)
Albumin: 4.2 g/dL (ref 3.8–4.8)
Alkaline Phosphatase: 54 IU/L (ref 44–121)
BUN/Creatinine Ratio: 17 (ref 12–28)
BUN: 13 mg/dL (ref 8–27)
Bilirubin Total: 0.3 mg/dL (ref 0.0–1.2)
CO2: 24 mmol/L (ref 20–29)
Calcium: 9.8 mg/dL (ref 8.7–10.3)
Chloride: 103 mmol/L (ref 96–106)
Creatinine, Ser: 0.75 mg/dL (ref 0.57–1.00)
Globulin, Total: 1.9 g/dL (ref 1.5–4.5)
Glucose: 88 mg/dL (ref 70–99)
Potassium: 4.1 mmol/L (ref 3.5–5.2)
Sodium: 143 mmol/L (ref 134–144)
Total Protein: 6.1 g/dL (ref 6.0–8.5)
eGFR: 87 mL/min/{1.73_m2} (ref >59)

## 2021-08-08 LAB — LIPID PANEL
Chol/HDL Ratio: 2.4 ratio (ref 0.0–4.4)
Cholesterol, Total: 159 mg/dL (ref 100–199)
HDL: 65 mg/dL (ref >39)
LDL Chol Calc (NIH): 71 mg/dL (ref 0–99)
Triglycerides: 133 mg/dL (ref 0–149)
VLDL Cholesterol Cal: 23 mg/dL (ref 5–40)

## 2021-08-08 LAB — CBC WITH DIFF/PLATELET
Basophils Absolute: 0.1 10*3/uL (ref 0.0–0.2)
Basos: 1 %
EOS (ABSOLUTE): 0.2 10*3/uL (ref 0.0–0.4)
Eos: 2 %
Hematocrit: 39.6 % (ref 34.0–46.6)
Hemoglobin: 13.2 g/dL (ref 11.1–15.9)
Immature Grans (Abs): 0 10*3/uL (ref 0.0–0.1)
Immature Granulocytes: 0 %
Lymphocytes Absolute: 1.6 10*3/uL (ref 0.7–3.1)
Lymphs: 16 %
MCH: 28.9 pg (ref 26.6–33.0)
MCHC: 33.3 g/dL (ref 31.5–35.7)
MCV: 87 fL (ref 79–97)
Monocytes Absolute: 0.6 10*3/uL (ref 0.1–0.9)
Monocytes: 6 %
Neutrophils Absolute: 7.3 10*3/uL — ABNORMAL HIGH (ref 1.4–7.0)
Neutrophils: 75 %
Platelets: 507 10*3/uL — ABNORMAL HIGH (ref 150–450)
RBC: 4.57 x10E6/uL (ref 3.77–5.28)
RDW: 13.5 % (ref 11.7–15.4)
WBC: 9.7 10*3/uL (ref 3.4–10.8)

## 2021-08-08 LAB — TSH: TSH: 4.79 u[IU]/mL — ABNORMAL HIGH (ref 0.450–4.500)

## 2021-08-08 MED ORDER — LEVOTHYROXINE SODIUM 50 MCG PO TABS
50.0000 ug | ORAL_TABLET | Freq: Every morning | ORAL | 3 refills | Status: DC
Start: 2021-08-08 — End: 2021-08-09

## 2021-08-08 MED ORDER — LEVOCETIRIZINE DIHYDROCHLORIDE 5 MG PO TABS: ORAL_TABLET | ORAL | 0 refills | Status: DC

## 2021-08-08 NOTE — Progress Notes (Signed)
Blood count normal except platelet elevated. Recommend send results to hematology and endocrinology.   ?Liver function normal.  ?Kidney function normal.  ?Thyroid function abnormal. Recommend change synthroid to 75 mcg once daily. Recheck in 6 weeks.  ?Cholesterol: good ? ?

## 2021-08-09 ENCOUNTER — Other Ambulatory Visit: Payer: Self-pay

## 2021-08-09 DIAGNOSIS — E039 Hypothyroidism, unspecified: Secondary | ICD-10-CM

## 2021-08-09 MED ORDER — LEVOTHYROXINE SODIUM 75 MCG PO TABS
75.0000 ug | ORAL_TABLET | Freq: Every day | ORAL | 1 refills | Status: DC
Start: 2021-08-09 — End: 2021-09-01

## 2021-08-14 ENCOUNTER — Telehealth: Payer: Self-pay

## 2021-08-14 NOTE — Telephone Encounter (Signed)
Attempted to call patient. No answer, left VM for return call.  ? ?Connie Coffey 08/14/21 4:19 PM ? ?

## 2021-08-14 NOTE — Telephone Encounter (Signed)
General/Other - medication management ? ?Pharmacy requested patient reach out to [place] due to xyzal becoming harder to get. Patient requesting alternative.  ? ?Harrell Lark 08/14/21 11:57 AM ? ? ?

## 2021-08-15 ENCOUNTER — Other Ambulatory Visit: Payer: Self-pay

## 2021-08-15 MED ORDER — CETIRIZINE HCL 10 MG PO TABS: 10.0000 mg | ORAL_TABLET | Freq: Every day | ORAL | 2 refills | Status: DC

## 2021-08-15 NOTE — Telephone Encounter (Signed)
Connie Coffey was notified. Zyrtec was sent to pharmacy as requested.   ?

## 2021-08-16 DIAGNOSIS — Z9689 Presence of other specified functional implants: Secondary | ICD-10-CM | POA: Diagnosis not present

## 2021-08-16 DIAGNOSIS — M961 Postlaminectomy syndrome, not elsewhere classified: Secondary | ICD-10-CM | POA: Diagnosis not present

## 2021-08-16 DIAGNOSIS — G894 Chronic pain syndrome: Secondary | ICD-10-CM | POA: Diagnosis not present

## 2021-08-20 ENCOUNTER — Encounter: Payer: Self-pay | Admitting: Family Medicine

## 2021-08-20 DIAGNOSIS — F17200 Nicotine dependence, unspecified, uncomplicated: Secondary | ICD-10-CM | POA: Insufficient documentation

## 2021-08-20 DIAGNOSIS — Z9889 Other specified postprocedural states: Secondary | ICD-10-CM | POA: Insufficient documentation

## 2021-08-20 NOTE — Assessment & Plan Note (Signed)
Refuses to quit. Brief education given.  ?

## 2021-08-20 NOTE — Assessment & Plan Note (Signed)
Refuses to quit at this time.  ?

## 2021-08-20 NOTE — Assessment & Plan Note (Signed)
Management per specialist. 

## 2021-08-20 NOTE — Assessment & Plan Note (Signed)
The current medical regimen is effective;  continue present plan and medications. Continue spiriva. Continue singulair for allergies.  ?Recommend smoking cessation. Patient refused. ?

## 2021-08-20 NOTE — Assessment & Plan Note (Signed)
Continue cortef 30 mg daily.  ?Management per specialist. Endocrinology.  ? ?

## 2021-08-20 NOTE — Assessment & Plan Note (Signed)
Continue vitamin D 50K weekly. 

## 2021-08-20 NOTE — Assessment & Plan Note (Signed)
Continue current medications.  ?Well controlled on buspirone 30 mg twice daily, duloxetine 60 mg once daily, and buspirone 30 mg twice daily, and clonazepam 0.5 mg twice daily. Sees psychiatry.  ?

## 2021-08-20 NOTE — Assessment & Plan Note (Signed)
Check tsh. Results not therapeutic. Recommend change synthroid to 75 mcg once daily. Recheck in 6 weeks.  ?

## 2021-08-20 NOTE — Assessment & Plan Note (Signed)
Well controlled.  No changes to medicines. Continue  lipitor 40 mg daily and fenofibrate 160 mg daily.  Continue to work on eating a healthy diet and exercise.  Labs drawn today.   

## 2021-08-20 NOTE — Assessment & Plan Note (Signed)
Continue amitiza 8 mcg twice daily. ?

## 2021-08-23 ENCOUNTER — Other Ambulatory Visit: Payer: Self-pay | Admitting: Family Medicine

## 2021-08-23 NOTE — Telephone Encounter (Signed)
Refill sent to pharmacy.   

## 2021-08-29 ENCOUNTER — Other Ambulatory Visit: Payer: Self-pay | Admitting: Legal Medicine

## 2021-08-31 ENCOUNTER — Telehealth: Payer: Self-pay

## 2021-08-31 NOTE — Progress Notes (Signed)
? ? ?Chronic Care Management ?Pharmacy Assistant  ? ?Name: Connie Coffey  MRN: [id number] DOB: 09-Oct-1955 ? ? ?Reason for Encounter: General Adherence Call  ?  ?Recent office visits:  ?08/14/21 Connie Brome MD. Orders Only. Recommend zyrtec 10 mg before bed ? ?08/07/21 Connie Brome MD. Seen for HLD and Hypothyroidism.  Recommend decrease omeprazole 40 mg daily. ? ?Recent consult visits:  ?None ? ?Hospital visits:  ?None ? ?Medications: ?Outpatient Encounter Medications as of 08/31/2021  ?Medication Sig  ? Abaloparatide (TYMLOS) 3120 MCG/1.56ML SOPN Inject 80 mcg into the skin daily.  ? albuterol (VENTOLIN HFA) 108 (90 Base) MCG/ACT inhaler Inhale 2 puffs into the lungs every 4 (four) hours as needed.  ? atorvastatin (LIPITOR) 40 MG tablet TAKE 1 TABLET BY MOUTH  DAILY  ? bethanechol (URECHOLINE) 25 MG tablet Take 1 tablet (25 mg total) by mouth in the morning and at bedtime.  ? busPIRone (BUSPAR) 30 MG tablet TAKE 1 TABLET BY MOUTH 2 TIMES DAILY.  ? cetirizine (ZYRTEC) 10 MG tablet Take 1 tablet (10 mg total) by mouth at bedtime.  ? clonazePAM (KLONOPIN) 0.5 MG tablet TAKE 1 TABLET BY MOUTH TWICE A DAY  ? cyclobenzaprine (FLEXERIL) 10 MG tablet TAKE 1 TABLET BY MOUTH 3  TIMES DAILY  ? D-Mannose 500 MG CAPS Take 500 mg by mouth in the morning and at bedtime.  ? diclofenac Sodium (VOLTAREN) 1 % GEL Apply 20 g topically 4 (four) times daily. (Patient taking differently: Apply 2 g topically 4 (four) times daily as needed (pain).)  ? DULoxetine (CYMBALTA) 60 MG capsule Take 1 capsule (60 mg total) by mouth daily.  ? DYMISTA 137-50 MCG/ACT SUSP One spray each nostril twice a day.  ? fenofibrate 160 MG tablet Take 1 tablet (160 mg total) by mouth daily.  ? gabapentin (NEURONTIN) 600 MG tablet TAKE 1 TABLET BY MOUTH 4  TIMES DAILY  ? guaiFENesin (MUCINEX) 600 MG 12 hr tablet Take 600 mg by mouth 2 (two) times daily as needed for cough or to loosen phlegm.  ? hydrocortisone (CORTEF) 10 MG tablet Take 10 mg by mouth 2 (two) times  daily.  ? Insulin Pen Needle (GLOBAL EASY GLIDE PEN NEEDLES) 32G X 4 MM MISC Use one needle daily as directed  ? levothyroxine (SYNTHROID) 75 MCG tablet Take 1 tablet (75 mcg total) by mouth daily before breakfast.  ? lubiprostone (AMITIZA) 8 MCG capsule TAKE 1 CAPSULE BY MOUTH TWICE  DAILY WITH MEALS  ? montelukast (SINGULAIR) 10 MG tablet Take 1 tablet (10 mg total) by mouth daily.  ? Multiple Vitamin (MULTI-VITAMINS) TABS Take 1 tablet by mouth daily.  ? nitrofurantoin, macrocrystal-monohydrate, (MACROBID) 100 MG capsule Take 100 mg by mouth daily.  ? olopatadine (PATANOL) 0.1 % ophthalmic solution Place 1 drop into both eyes daily as needed for allergies.  ? omeprazole (PRILOSEC) 40 MG capsule Take 1 capsule (40 mg total) by mouth 2 (two) times daily.  ? oxyCODONE-acetaminophen (PERCOCET) 10-325 MG tablet TAKE 1 TAB EVERY 4 HOURS FOR 5 DAYS THEN 1 TAB TWICE A DAY AS NEEDED FOR SEVERE BREAKTHROUGH PAIN  ? primidone (MYSOLINE) 50 MG tablet Take 1 tablet (50 mg total) by mouth 2 (two) times daily.  ? Propylene Glycol (SYSTANE BALANCE) 0.6 % SOLN Place 1 drop into both eyes daily as needed (dry eyes).  ? SPIRIVA RESPIMAT 2.5 MCG/ACT AERS USE 1 INHALATION BY MOUTH  TWICE DAILY  ? Vitamin D, Ergocalciferol, (DRISDOL) 50000 units CAPS capsule Take 50,000 Units by  mouth every Wednesday.  ? ?No facility-administered encounter medications on file as of 08/31/2021.  ? ? ?Contacted Connie Coffey for general disease state and medication adherence call.  ? ?Patient is not > 5 days past due for refill on the following medications per chart history: ? ?[facility] Medications: ?Medication Name/mg Last Fill Days Supply ?Atorvastatin 40 mg                 08/29/21  100ds ?05/31/21              90ds ? ?Care Gaps: ?Last annual wellness visit?02/06/21 ?\Mammogram: 04/07/21 ?Colonoscopy: 06/04/19 ?Dexa Scan: 04/06/20 ? ?Unable to reach pt to complete this call  ? ? ?Connie Coffey, CMA ?Clinical Pharmacist Assistant  ?[phone]  ?

## 2021-09-01 ENCOUNTER — Other Ambulatory Visit: Payer: Self-pay | Admitting: Family Medicine

## 2021-09-06 ENCOUNTER — Ambulatory Visit (INDEPENDENT_AMBULATORY_CARE_PROVIDER_SITE_OTHER): Payer: Medicare Other | Admitting: Nurse Practitioner

## 2021-09-06 ENCOUNTER — Encounter: Payer: Self-pay | Admitting: Nurse Practitioner

## 2021-09-06 VITALS — BP 118/68 | HR 97 | Temp 97.3°F | Wt 142.0 lb

## 2021-09-06 DIAGNOSIS — B3781 Candidal esophagitis: Secondary | ICD-10-CM | POA: Diagnosis not present

## 2021-09-06 DIAGNOSIS — B370 Candidal stomatitis: Secondary | ICD-10-CM

## 2021-09-06 MED ORDER — FLUCONAZOLE 200 MG PO TABS: 200.0000 mg | ORAL_TABLET | Freq: Every day | ORAL | 0 refills | Status: DC

## 2021-09-06 MED ORDER — NYSTATIN 100000 UNIT/ML MT SUSP: 5.0000 mL | Freq: Four times a day (QID) | OROMUCOSAL | 2 refills | Status: DC

## 2021-09-06 NOTE — Progress Notes (Signed)
? ?Acute Office Visit ? ?Subjective:  ? ? Patient ID: Connie Coffey, female    DOB: 1956/01/09, 66 y.o.   MRN: [id number] ? ?CC: ?Sore throat ? ?HPI: ?Patient is in today for sore throat x 2 days, states her tongue started bleeding from brushing it, dry throat. Denies fever or known exposure to ill contacts. Uses dymista spray BID and Zyrtec as needed for chronic allergic rhinitis. Denies trying treatments at home. ? ?Past Medical History:  ?Diagnosis Date  ? Adrenal insufficiency ([place])   ? Anxiety   ? Arthritis   ? Carpal tunnel syndrome   ? Both hands, worse in right  ? Chronic constipation   ? COPD (chronic obstructive pulmonary disease) ([place])   ? CVA (cerebral vascular accident) [facility])   ? Depression   ? Facet syndrome   ? Fibromyalgia   ? GERD (gastroesophageal reflux disease)   ? Heart murmur   ? Hypercholesteremia   ? IBS (irritable bowel syndrome)   ? Ischemic stroke ([place])   ? 07/19/2004  ? Lumbar radiculopathy   ? Osteoporosis   ? Radiculopathy, lumbar region 05/02/2021  ? RLS (restless legs syndrome)   ? Spinal stenosis   ? TMJ (temporomandibular joint disorder)   ? Trochanteric bursitis of both hips   ? Vascular malformation   ? spinal cord  ? ? ?Past Surgical History:  ?Procedure Laterality Date  ? APPENDECTOMY    ? CARPAL TUNNEL RELEASE Right 12/13/2017  ? surgery  ? CHOLECYSTECTOMY    ? COLONOSCOPY  05/06/2017  ? Colonic polyps status post polypectomy. Interan land external hemorrhoids.   ? ESOPHAGOGASTRODUODENOSCOPY  04/28/2007  ? Mild gastrtiis. Otherwise normal EGD.   ? HEMORROIDECTOMY    ? LAMINECTOMY AND MICRODISCECTOMY LUMBAR SPINE  01/03/2005  ? NECK SURGERY    ? x2   ? OTHER SURGICAL HISTORY  04/24/2004  ? anterior cervical discectomy and fusion at C5-C6  ? SPINAL CORD STIMULATOR INSERTION N/A 07/21/2021  ? Procedure: Permanant Spinal cord stimulator placement leads and battery under fluroscopy;  Surgeon: Reece Agar, MD;  Location: [facility];  Service: Neurosurgery;  Laterality: N/A;  ?  TONSILLECTOMY    ? TOTAL ABDOMINAL HYSTERECTOMY    ? ? ?Family History  ?Problem Relation Age of Onset  ? Supraventricular tachycardia Mother   ? Dementia Mother   ? Hyperlipidemia Mother   ? Hypertension Mother   ? Osteoarthritis Mother   ? Osteoporosis Mother   ? Breast cancer Other   ? Breast cancer Other   ? Colon cancer Neg Hx   ? Esophageal cancer Neg Hx   ? ? ?Social History  ? ?Socioeconomic History  ? Marital status: Divorced  ?  Spouse name: Not on file  ? Number of children: 1  ? Years of education: Not on file  ? Highest education level: Not on file  ?Occupational History  ? Occupation: Disabled  ?Tobacco Use  ? Smoking status: Every Day  ?  Packs/day: 1.00  ?  Types: Cigarettes  ? Smokeless tobacco: Never  ?Vaping Use  ? Vaping Use: Never used  ?Substance and Sexual Activity  ? Alcohol use: Not Currently  ?  Comment: quit 19 years  ? Drug use: Never  ? Sexual activity: Not Currently  ?Other Topics Concern  ? Not on file  ?Social History Narrative  ? Disabled son lives with her sometimes and his father sometimes.  Dorreen is working hard to have a relationship with her son's father and his  wife.  They help her when needed.  ? ?Social Determinants of Health  ? ?Financial Resource Strain: Not on file  ?Food Insecurity: Not on file  ?Transportation Needs: Unmet Transportation Needs  ? Lack of Transportation (Medical): Yes  ? Lack of Transportation (Non-Medical): Yes  ?Physical Activity: Not on file  ?Stress: Not on file  ?Social Connections: Not on file  ?Intimate Partner Violence: Not on file  ? ? ?Outpatient Medications Prior to Visit  ?Medication Sig Dispense Refill  ? Abaloparatide (TYMLOS) 3120 MCG/1.56ML SOPN Inject 80 mcg into the skin daily. 4.68 mL 1  ? albuterol (VENTOLIN HFA) 108 (90 Base) MCG/ACT inhaler Inhale 2 puffs into the lungs every 4 (four) hours as needed. 18 g 2  ? atorvastatin (LIPITOR) 40 MG tablet TAKE 1 TABLET BY MOUTH  DAILY 90 tablet 3  ? bethanechol (URECHOLINE) 25 MG tablet Take  1 tablet (25 mg total) by mouth in the morning and at bedtime. 180 tablet 1  ? busPIRone (BUSPAR) 30 MG tablet TAKE 1 TABLET BY MOUTH 2 TIMES DAILY. 180 tablet 1  ? cetirizine (ZYRTEC) 10 MG tablet Take 1 tablet (10 mg total) by mouth at bedtime. 90 tablet 2  ? clonazePAM (KLONOPIN) 0.5 MG tablet TAKE 1 TABLET BY MOUTH TWICE A DAY 60 tablet 2  ? cyclobenzaprine (FLEXERIL) 10 MG tablet TAKE 1 TABLET BY MOUTH 3  TIMES DAILY 270 tablet 1  ? D-Mannose 500 MG CAPS Take 500 mg by mouth in the morning and at bedtime.    ? diclofenac Sodium (VOLTAREN) 1 % GEL Apply 20 g topically 4 (four) times daily. (Patient taking differently: Apply 2 g topically 4 (four) times daily as needed (pain).) 20 g 0  ? DULoxetine (CYMBALTA) 60 MG capsule Take 1 capsule (60 mg total) by mouth daily. 180 capsule 1  ? DYMISTA 137-50 MCG/ACT SUSP One spray each nostril twice a day. 23 g 3  ? fenofibrate 160 MG tablet Take 1 tablet (160 mg total) by mouth daily. 90 tablet 1  ? gabapentin (NEURONTIN) 600 MG tablet TAKE 1 TABLET BY MOUTH 4  TIMES DAILY 360 tablet 3  ? guaiFENesin (MUCINEX) 600 MG 12 hr tablet Take 600 mg by mouth 2 (two) times daily as needed for cough or to loosen phlegm.    ? hydrocortisone (CORTEF) 10 MG tablet Take 10 mg by mouth 2 (two) times daily.    ? Insulin Pen Needle (GLOBAL EASY GLIDE PEN NEEDLES) 32G X 4 MM MISC Use one needle daily as directed    ? levothyroxine (SYNTHROID) 75 MCG tablet TAKE 1 TABLET BY MOUTH DAILY BEFORE BREAKFAST. 30 tablet 0  ? lubiprostone (AMITIZA) 8 MCG capsule TAKE 1 CAPSULE BY MOUTH TWICE  DAILY WITH MEALS 180 capsule 3  ? montelukast (SINGULAIR) 10 MG tablet Take 1 tablet (10 mg total) by mouth daily. 90 tablet 3  ? Multiple Vitamin (MULTI-VITAMINS) TABS Take 1 tablet by mouth daily.    ? nitrofurantoin, macrocrystal-monohydrate, (MACROBID) 100 MG capsule Take 100 mg by mouth daily.    ? olopatadine (PATANOL) 0.1 % ophthalmic solution Place 1 drop into both eyes daily as needed for allergies.     ? omeprazole (PRILOSEC) 40 MG capsule Take 1 capsule (40 mg total) by mouth 2 (two) times daily. 180 capsule 3  ? oxyCODONE-acetaminophen (PERCOCET) 10-325 MG tablet TAKE 1 TAB EVERY 4 HOURS FOR 5 DAYS THEN 1 TAB TWICE A DAY AS NEEDED FOR SEVERE BREAKTHROUGH PAIN  0  ? primidone (  MYSOLINE) 50 MG tablet Take 1 tablet (50 mg total) by mouth 2 (two) times daily. 180 tablet 1  ? Propylene Glycol (SYSTANE BALANCE) 0.6 % SOLN Place 1 drop into both eyes daily as needed (dry eyes).    ? SPIRIVA RESPIMAT 2.5 MCG/ACT AERS USE 1 INHALATION BY MOUTH  TWICE DAILY 12 g 3  ? Vitamin D, Ergocalciferol, (DRISDOL) 50000 units CAPS capsule Take 50,000 Units by mouth every Wednesday.    ? ?No facility-administered medications prior to visit.  ? ? ?Allergies  ?Allergen Reactions  ? Beta Vulgaris Anaphylaxis  ?  Beets   ? Penicillins Shortness Of Breath  ? Celebrex [Celecoxib]   ?  "because of stomach ulcers"  ? Chantix [Varenicline]   ?  Makes pt sick on her stomach   ? Dog Epithelium Allergy Skin Test   ?  Cats and dogs  ? Fish Oil Other (See Comments)  ?  hemorrhoids  ? Gramineae Pollens   ? Molds & Smuts   ? Nsaids Other (See Comments)  ?  GI Upset. ?Ulcers  ? Nicotrol [Nicotine] Nausea Only  ? ? ?Review of Systems  ?Constitutional:  Negative for chills and fever.  ?HENT:  Positive for sore throat. Negative for congestion, postnasal drip, rhinorrhea, sinus pressure and sinus pain.   ?Respiratory:  Negative for cough.   ? ?   ?Objective:  ?  ?Physical Exam ?Vitals reviewed.  ?HENT:  ?   Right Ear: Tympanic membrane normal.  ?   Left Ear: Tympanic membrane normal.  ?   Nose: Congestion and rhinorrhea present.  ?   Mouth/Throat:  ?   Dentition: Has dentures.  ?   Pharynx: Uvula midline. Posterior oropharyngeal erythema present.  ?   Comments: Candidiasis white patches to anterior tongue and pharynx ?Cardiovascular:  ?   Rate and Rhythm: Normal rate and regular rhythm.  ?Pulmonary:  ?   Effort: Pulmonary effort is normal.  ?   Breath  sounds: Normal breath sounds.  ?Skin: ?   General: Skin is warm and dry.  ?   Capillary Refill: Capillary refill takes less than 2 seconds.  ?Neurological:  ?   Mental Status: She is alert.  ?Psychiatric:     ?

## 2021-09-06 NOTE — Patient Instructions (Addendum)
Warm salt water gargles ?Replace toothbrush ?Rest and push fluids ?Complete Diflucan as prescribed ?Follow-up as needed ? ?Oral Thrush, Adult ?Oral thrush is an infection in your mouth and throat and on your tongue. It causes white patches to form in your mouth and on your tongue. ?Many cases of thrush are mild. But, sometimes, thrush can be serious. People who have a weak body defense system (immune system) or other diseases can be affected more. ?What are the causes? ?This condition is caused by a type of fungus called yeast. The fungus is normally present in small amounts in the mouth and nose. If a person has a Dock[place]-term illness or a weak body defense system, the fungus can grow and spread quickly. This causes thrush. ?What increases the risk? ?You are more likely to develop this condition if: ?You have a weak body defense system. ?You are an older adult. ?You have diabetes, cancer, or HIV. ?You have a dry mouth. ?You are pregnant or breastfeeding. ?You do not take good care of your teeth. This risk is greater for people who have false teeth (dentures). ?You use antibiotic or steroid medicines. ?What are the signs or symptoms? ?Symptoms of this condition include: ?A burning feeling in the mouth and throat. ?White patches that stick to the mouth and tongue. ?A bad taste in the mouth or trouble tasting foods. ?A feeling like you have cotton in your mouth. ?Pain when you eat and swallow. ?Not wanting to eat as much as usual. ?Cracking at the corners of the mouth. ?How is this treated? ?This condition is treated with medicines called antifungals. These medicines prevent a fungus from growing. The medicines are either put right on the area (topical) or swallowed (oral). ?Your doctor will also treat other problems that you may have, such as diabetes or HIV. ?Follow these instructions at home: ?Helping with pain and soreness ?To lessen your pain: ?Drink cold liquids, like water and iced tea. ?Eat frozen ice pops or  frozen juices. ?Eat foods that are easy to swallow, like gelatin and ice cream. ?Drink from a straw if you have too much pain in your mouth. ? ?General instructions ?Take or use over-the-counter and prescription medicines only as told by your doctor. ?Eat plain yogurt that has live cultures in it. Read the label to make sure that there are live cultures in your yogurt. ?If you wear false teeth: ?Take them out before you go to bed. ?Brush them well. ?Soak them in a cleaner. ?Rinse your mouth with warm salt-water many times a day. To make the salt-water mixture, dissolve ?-1 teaspoon (3-6 g) of salt in 1 cup (237 mL) of warm water. ?Contact a doctor if: ?Your problems do not get better within 7 days of treatment. ?Your infection is spreading. This may show as white areas on the skin outside of your mouth. ?You are nursing your baby and you have redness and pain in the nipples. ?Summary ?Oral thrush is an infection in your mouth and throat. It is caused by a fungus. ?You are more likely to get this condition if you have a weak body defense system. Diseases like diabetes, cancer, or HIV also add to your risk. ?This condition is treated with medicines called antifungals. ?Contact a doctor if you do not get better within 7 days of starting treatment. ?This information is not intended to replace advice given to you by your health care provider. Make sure you discuss any questions you have with your health care provider. ?Document Revised:  01/11/2021 Document Reviewed: 03/20/2019 ?Elsevier Patient Education ? 2022 [place]. ? ? ? ?Pharyngitis ?Pharyngitis is a sore throat (pharynx). This is when there is redness, pain, and swelling in your throat. Most of the time, this condition gets better on its own. In some cases, you may need medicine. ?What are the causes? ?An infection from a virus. ?An infection from bacteria. ?Allergies. ?What increases the risk? ?Being 26-52 years old. ?Being in crowded environments. These  include: ?Daycares. ?Schools. ?Dormitories. ?Living in a place with cold temperatures outside. ?Having a weakened disease-fighting (immune) system. ?What are the signs or symptoms? ?Symptoms may vary depending on the cause. Common symptoms include: ?Sore throat. ?Tiredness (fatigue). ?Low-grade fever. ?Stuffy nose. ?Cough. ?Headache. ?Other symptoms may include: ?Glands in the neck (lymph nodes) that are swollen. ?Skin rashes. ?Film on the throat or tonsils. This can be caused by an infection from bacteria. ?Vomiting. ?Red, itchy eyes. ?Loss of appetite. ?Joint pain and muscle aches. ?Tonsils that are temporarily bigger than usual (enlarged). ?How is this treated? ?Many times, treatment is not needed. This condition usually gets better in 3-4 days without treatment. ?If the infection is caused by a bacteria, you may be need to take antibiotics. ?Follow these instructions at home: ?Medicines ?Take over-the-counter and prescription medicines only as told by your doctor. ?If you were prescribed an antibiotic medicine, take it as told by your doctor. Do not stop taking the antibiotic even if you start to feel better. ?Use throat lozenges or sprays to soothe your throat as told by your doctor. ?Children can get pharyngitis. Do not give your child aspirin. ?Managing pain ?To help with pain, try: ?Sipping warm liquids, such as: ?Broth. ?Herbal tea. ?Warm water. ?Eating or drinking cold or frozen liquids, such as frozen ice pops. ?Rinsing your mouth (gargle) with a salt water mixture 3-4 times a day or as needed. ?To make salt water, dissolve ?-1 tsp (3-6 g) of salt in 1 cup (237 mL) of warm water. ?Do not swallow this mixture. ?Sucking on hard candy or throat lozenges. ?Putting a cool-mist humidifier in your bedroom at night to moisten the air. ?Sitting in the bathroom with the door closed for 5-10 minutes while you run hot water in the shower. ? ?General instructions ? ?Do not smoke or use any products that contain  nicotine or tobacco. If you need help quitting, ask your doctor. ?Rest as told by your doctor. ?Drink enough fluid to keep your pee (urine) pale yellow. ?How is this prevented? ?Wash your hands often for at least 20 seconds with soap and water. If soap and water are not available, use hand sanitizer. ?Do not touch your eyes, nose, or mouth with unwashed hands. Wash hands after touching these areas. ?Do not share cups or eating utensils. ?Avoid close contact with people who are sick. ?Contact a doctor if: ?You have large, tender lumps in your neck. ?You have a rash. ?You cough up green, yellow-brown, or bloody spit. ?Get help right away if: ?You have a stiff neck. ?You drool or cannot swallow liquids. ?You cannot drink or take medicines without vomiting. ?You have very bad pain that does not go away with medicine. ?You have problems breathing, and it is not from a stuffy nose. ?You have new pain and swelling in your knees, ankles, wrists, or elbows. ?These symptoms may be an emergency. Get help right away. Call your local emergency services (911 in the [place] ?Do not wait to see if the symptoms will go away. ?Do  not drive yourself to the hospital. ?Summary ?Pharyngitis is a sore throat (pharynx). This is when there is redness, pain, and swelling in your throat. ?Most of the time, pharyngitis gets better on its own. Sometimes, you may need medicine. ?If you were prescribed an antibiotic medicine, take it as told by your doctor. Do not stop taking the antibiotic even if you start to feel better. ?This information is not intended to replace advice given to you by your health care provider. Make sure you discuss any questions you have with your health care provider. ?Document Revised: 08/10/2020 Document Reviewed: 08/10/2020 ?Elsevier Patient Education ? [place]. ? ?

## 2021-09-20 ENCOUNTER — Ambulatory Visit: Payer: Medicare Other | Admitting: Nurse Practitioner

## 2021-09-20 ENCOUNTER — Other Ambulatory Visit: Payer: Medicare Other

## 2021-09-21 ENCOUNTER — Encounter: Payer: Self-pay | Admitting: Nurse Practitioner

## 2021-09-21 ENCOUNTER — Ambulatory Visit (INDEPENDENT_AMBULATORY_CARE_PROVIDER_SITE_OTHER): Payer: Medicare Other | Admitting: Nurse Practitioner

## 2021-09-21 VITALS — BP 136/76 | HR 103 | Temp 97.3°F | Ht 63.0 in | Wt 139.0 lb

## 2021-09-21 DIAGNOSIS — H6121 Impacted cerumen, right ear: Secondary | ICD-10-CM

## 2021-09-21 DIAGNOSIS — J441 Chronic obstructive pulmonary disease with (acute) exacerbation: Secondary | ICD-10-CM

## 2021-09-21 DIAGNOSIS — H6502 Acute serous otitis media, left ear: Secondary | ICD-10-CM | POA: Diagnosis not present

## 2021-09-21 DIAGNOSIS — J3089 Other allergic rhinitis: Secondary | ICD-10-CM | POA: Diagnosis not present

## 2021-09-21 MED ORDER — DOXYCYCLINE HYCLATE 100 MG PO TABS
100.0000 mg | ORAL_TABLET | Freq: Two times a day (BID) | ORAL | 0 refills | Status: DC
Start: 2021-09-21 — End: 2021-12-08

## 2021-09-21 NOTE — Patient Instructions (Addendum)
Take Doxycycline 100 mg twice daily for 7 days ?Use Flonase nasal spray daily ?Continue inhalers and allergy medications ?Follow-up as needed ? ?Chronic Obstructive Pulmonary Disease Exacerbation ?Otitis Media, Adult ? ?Otitis media occurs when there is inflammation and fluid in the middle ear with signs and symptoms of an acute infection. The middle ear is a part of the ear that contains bones for hearing as well as air that helps send sounds to the brain. When infected fluid builds up in this space, it causes pressure and can lead to an ear infection. The eustachian tube connects the middle ear to the back of the nose (nasopharynx) and normally allows air into the middle ear. If the eustachian tube becomes blocked, fluid can build up and become infected. ?What are the causes? ?This condition is caused by a blockage in the eustachian tube. This can be caused by mucus or by swelling of the tube. Problems that can cause a blockage include: ?A cold or other upper respiratory infection. ?Allergies. ?An irritant, such as tobacco smoke. ?Enlarged adenoids. The adenoids are areas of soft tissue located high in the back of the throat, behind the nose and the roof of the mouth. They are part of the body's defense system (immune system). ?A mass in the nasopharynx. ?Damage to the ear caused by pressure changes (barotrauma). ?What increases the risk? ?You are more likely to develop this condition if you: ?Smoke or are exposed to tobacco smoke. ?Have an opening in the roof of your mouth (cleft palate). ?Have gastroesophageal reflux. ?Have an immune system disorder. ?What are the signs or symptoms? ?Symptoms of this condition include: ?Ear pain. ?Fever. ?Decreased hearing. ?Tiredness (lethargy). ?Fluid leaking from the ear, if the eardrum is ruptured or has burst. ?Ringing in the ear. ?How is this diagnosed? ? ?This condition is diagnosed with a physical exam. During the exam, your health care provider will use an instrument  called an otoscope to look in your ear and check for redness, swelling, and fluid. He or she will also ask about your symptoms. ?Your health care provider may also order tests, such as: ?A pneumatic otoscopy. This is a test to check the movement of the eardrum. It is done by squeezing a small amount of air into the ear. ?A tympanogram. This is a test that shows how well the eardrum moves in response to air pressure in the ear canal. It provides a graph for your health care provider to review. ?How is this treated? ?This condition can go away on its own within 3-5 days. But if the condition is caused by a bacterial infection and does not go away on its own, or if it keeps coming back, your health care provider may: ?Prescribe antibiotic medicine to treat the infection. ?Prescribe or recommend medicines to control pain. ?Follow these instructions at home: ?Take over-the-counter and prescription medicines only as told by your health care provider. ?If you were prescribed an antibiotic medicine, take it as told by your health care provider. Do not stop taking the antibiotic even if you start to feel better. ?Keep all follow-up visits. This is important. ?Contact a health care provider if: ?You have bleeding from your nose. ?There is a lump on your neck. ?You are not feeling better in 5 days. ?You feel worse instead of better. ?Get help right away if: ?You have severe pain that is not controlled with medicine. ?You have swelling, redness, or pain around your ear. ?You have stiffness in your neck. ?A part  of your face is not moving (paralyzed). ?The bone behind your ear (mastoid bone) is tender when you touch it. ?You develop a severe headache. ?Summary ?Otitis media is redness, soreness, and swelling of the middle ear, usually resulting in pain and decreased hearing. ?This condition can go away on its own within 3-5 days. ?If the problem does not go away in 3-5 days, your health care provider may give you medicines to  treat the infection. ?If you were prescribed an antibiotic medicine, take it as told by your health care provider. ?Follow all instructions that were given to you by your health care provider. ?This information is not intended to replace advice given to you by your health care provider. Make sure you discuss any questions you have with your health care provider. ?Document Revised: 08/22/2020 Document Reviewed: 08/22/2020 ?Elsevier Patient Education ? [place]. ? ?Chronic obstructive pulmonary disease (COPD) is a [place]-term (chronic) condition that affects the lungs. COPD is a general term that can be used to describe many different lung problems that cause lung inflammation and limit airflow, including chronic bronchitis and emphysema. COPD exacerbations are episodes when breathing symptoms flare up, become much worse, and require extra treatment. ?COPD exacerbations are usually caused by infections. Without treatment, COPD exacerbations can be severe and even life threatening. Frequent COPD exacerbations can cause further damage to the lungs. ?What are the causes? ?This condition may be caused by: ?Respiratory infections, including viral and bacterial infections. ?Exposure to smoke. ?Exposure to air pollution, chemical fumes, or dust. ?Things that can cause an allergic reaction (allergens). ?Not taking your usual COPD medicines as directed. ?Underlying medical problems, such as congestive heart failure or infections not involving the lungs. ?In many cases, the cause of this condition is not known. ?What increases the risk? ?The following factors may make you more likely to develop this condition: ?Smoking cigarettes. ?Being an older adult. ?Having frequent prior COPD exacerbations. ?What are the signs or symptoms? ?Symptoms of this condition include: ?Increased coughing. ?Increased production of mucus from your lungs. ?Increased wheezing and shortness of breath. ?Rapid or labored breathing. ?Chest  tightness. ?Less energy than usual. ?Sleep disruption from symptoms. ?Confusion ?Increased sleepiness. ?Often, these symptoms happen or get worse even with the use of medicines. ?How is this diagnosed? ?This condition is diagnosed based on: ?Your medical history. ?A physical exam. ?You may also have tests, including: ?A chest X-ray. ?Blood tests. ?Lung (pulmonary) function tests. ?How is this treated? ?Treatment for this condition depends on the severity and cause of the symptoms. You may need to be admitted to a hospital for treatment. Some of the treatments commonly used to treat COPD exacerbations are: ?Antibiotic medicines. These may be used for severe exacerbations caused by a lung infection, such as pneumonia. ?Bronchodilators. These are inhaled medicines that expand the air passages and allow increased airflow. They may make your breathing more comfortable. ?Steroid medicines. These act to reduce inflammation in the airways. They may be given with an inhaler, taken by mouth, or given through an IV tube inserted into one of your veins. ?Supplemental oxygen therapy. ?Airway clearing techniques, such as noninvasive ventilation (NIV) and positive expiratory pressure (PEP). These provide respiratory support through a mask or other noninvasive device. An example of this would be using a continuous positive airway pressure (CPAP) machine to improve delivery of oxygen into your lungs. ?Follow these instructions at home: ?Medicines ?Take over-the-counter and prescription medicines only as told by your health care provider. ?It is important to  use correct technique with inhaled medicines. ?If you were prescribed an antibiotic medicine or oral steroid, take it as told by your health care provider. Do not stop taking the medicine even if you start to feel better. ?Lifestyle ?Do not use any products that contain nicotine or tobacco. These products include cigarettes, chewing tobacco, and vaping devices, such as  e-cigarettes. If you need help quitting, ask your health care provider. ?Eat a healthy diet. ?Exercise regularly. ?Get enough sleep. Most adults need 7 or more hours per night. ?Avoid exposure to all substances that irritat

## 2021-09-21 NOTE — Progress Notes (Addendum)
? ?Acute Office Visit ? ?Subjective:  ? ? Patient ID: Connie Coffey, female    DOB: 1955/12/12, 66 y.o.   MRN: [id number] ? ?Chief Complaint  ?Patient presents with  ?? Sore Throat  ? ? ?HPI: ?Patient is in today for a sore throat, cough, rhinorrhea, and left ear pain. Reports symptoms began 3 days ago after cleaning an old abandoned house of rat droppings. Treatment has included nasal sprays and inhalers prescribed for allergic rhinitis and COPD. She is currently a cigarette smoker.  ? ?Past Medical History:  ?Diagnosis Date  ?? Adrenal insufficiency ([place])   ?? Anxiety   ?? Arthritis   ?? Carpal tunnel syndrome   ? Both hands, worse in right  ?? Chronic constipation   ?? COPD (chronic obstructive pulmonary disease) ([place])   ?? CVA (cerebral vascular accident) ([place])   ?? Depression   ?? Facet syndrome   ?? Fibromyalgia   ?? GERD (gastroesophageal reflux disease)   ?? Heart murmur   ?? Hypercholesteremia   ?? IBS (irritable bowel syndrome)   ?? Ischemic stroke ([place])   ? 07/19/2004  ?? Lumbar radiculopathy   ?? Osteoporosis   ?? Radiculopathy, lumbar region 05/02/2021  ?? RLS (restless legs syndrome)   ?? Spinal stenosis   ?? TMJ (temporomandibular joint disorder)   ?? Trochanteric bursitis of both hips   ?? Vascular malformation   ? spinal cord  ? ? ?Past Surgical History:  ?Procedure Laterality Date  ?? APPENDECTOMY    ?? CARPAL TUNNEL RELEASE Right 12/13/2017  ? surgery  ?? CHOLECYSTECTOMY    ?? COLONOSCOPY  05/06/2017  ? Colonic polyps status post polypectomy. Interan land external hemorrhoids.   ?? ESOPHAGOGASTRODUODENOSCOPY  04/28/2007  ? Mild gastrtiis. Otherwise normal EGD.   ?? HEMORROIDECTOMY    ?? LAMINECTOMY AND MICRODISCECTOMY LUMBAR SPINE  01/03/2005  ?? NECK SURGERY    ? x2   ?? OTHER SURGICAL HISTORY  04/24/2004  ? anterior cervical discectomy and fusion at C5-C6  ?? SPINAL CORD STIMULATOR INSERTION N/A 07/21/2021  ? Procedure: Permanant Spinal cord stimulator placement leads and battery under fluroscopy;   Surgeon: Reece Agar, MD;  Location: [facility];  Service: Neurosurgery;  Laterality: N/A;  ?? TONSILLECTOMY    ?? TOTAL ABDOMINAL HYSTERECTOMY    ? ? ?Family History  ?Problem Relation Age of Onset  ?? Supraventricular tachycardia Mother   ?? Dementia Mother   ?? Hyperlipidemia Mother   ?? Hypertension Mother   ?? Osteoarthritis Mother   ?? Osteoporosis Mother   ?? Breast cancer Other   ?? Breast cancer Other   ?? Colon cancer Neg Hx   ?? Esophageal cancer Neg Hx   ? ? ?Social History  ? ?Socioeconomic History  ?? Marital status: Divorced  ?  Spouse name: Not on file  ?? Number of children: 1  ?? Years of education: Not on file  ?? Highest education level: Not on file  ?Occupational History  ?? Occupation: Disabled  ?Tobacco Use  ?? Smoking status: Every Day  ?  Packs/day: 1.00  ?  Types: Cigarettes  ?? Smokeless tobacco: Never  ?Vaping Use  ?? Vaping Use: Never used  ?Substance and Sexual Activity  ?? Alcohol use: Not Currently  ?  Comment: quit 19 years  ?? Drug use: Never  ?? Sexual activity: Not Currently  ?Other Topics Concern  ?? Not on file  ?Social History Narrative  ? Disabled son lives with her sometimes and his father sometimes.  Jenny Reichmann  is working hard to have a relationship with her son's father and his wife.  They help her when needed.  ? ?Social Determinants of Health  ? ?Financial Resource Strain: Not on file  ?Food Insecurity: Not on file  ?Transportation Needs: Unmet Transportation Needs  ?? Lack of Transportation (Medical): Yes  ?? Lack of Transportation (Non-Medical): Yes  ?Physical Activity: Not on file  ?Stress: Not on file  ?Social Connections: Not on file  ?Intimate Partner Violence: Not on file  ? ? ?Outpatient Medications Prior to Visit  ?Medication Sig Dispense Refill  ?? Abaloparatide (TYMLOS) 3120 MCG/1.56ML SOPN Inject 80 mcg into the skin daily. 4.68 mL 1  ?? albuterol (VENTOLIN HFA) 108 (90 Base) MCG/ACT inhaler Inhale 2 puffs into the lungs every 4 (four) hours as needed. 18 g 2  ??  atorvastatin (LIPITOR) 40 MG tablet TAKE 1 TABLET BY MOUTH  DAILY 90 tablet 3  ?? bethanechol (URECHOLINE) 25 MG tablet Take 1 tablet (25 mg total) by mouth in the morning and at bedtime. 180 tablet 1  ?? busPIRone (BUSPAR) 30 MG tablet TAKE 1 TABLET BY MOUTH 2 TIMES DAILY. 180 tablet 1  ?? cetirizine (ZYRTEC) 10 MG tablet Take 1 tablet (10 mg total) by mouth at bedtime. 90 tablet 2  ?? clonazePAM (KLONOPIN) 0.5 MG tablet TAKE 1 TABLET BY MOUTH TWICE A DAY 60 tablet 2  ?? cyclobenzaprine (FLEXERIL) 10 MG tablet TAKE 1 TABLET BY MOUTH 3  TIMES DAILY 270 tablet 1  ?? D-Mannose 500 MG CAPS Take 500 mg by mouth in the morning and at bedtime.    ?? diclofenac Sodium (VOLTAREN) 1 % GEL Apply 20 g topically 4 (four) times daily. (Patient taking differently: Apply 2 g topically 4 (four) times daily as needed (pain).) 20 g 0  ?? DULoxetine (CYMBALTA) 60 MG capsule Take 1 capsule (60 mg total) by mouth daily. 180 capsule 1  ?? DYMISTA 137-50 MCG/ACT SUSP One spray each nostril twice a day. 23 g 3  ?? fenofibrate 160 MG tablet Take 1 tablet (160 mg total) by mouth daily. 90 tablet 1  ?? fluconazole (DIFLUCAN) 200 MG tablet Take 1 tablet (200 mg total) by mouth daily. Take for 2 days 2 tablet 0  ?? gabapentin (NEURONTIN) 600 MG tablet TAKE 1 TABLET BY MOUTH 4  TIMES DAILY 360 tablet 3  ?? guaiFENesin (MUCINEX) 600 MG 12 hr tablet Take 600 mg by mouth 2 (two) times daily as needed for cough or to loosen phlegm.    ?? hydrocortisone (CORTEF) 10 MG tablet Take 10 mg by mouth 2 (two) times daily.    ?? Insulin Pen Needle (GLOBAL EASY GLIDE PEN NEEDLES) 32G X 4 MM MISC Use one needle daily as directed    ?? levothyroxine (SYNTHROID) 75 MCG tablet TAKE 1 TABLET BY MOUTH DAILY BEFORE BREAKFAST. 30 tablet 0  ?? lubiprostone (AMITIZA) 8 MCG capsule TAKE 1 CAPSULE BY MOUTH TWICE  DAILY WITH MEALS 180 capsule 3  ?? montelukast (SINGULAIR) 10 MG tablet Take 1 tablet (10 mg total) by mouth daily. 90 tablet 3  ?? Multiple Vitamin  (MULTI-VITAMINS) TABS Take 1 tablet by mouth daily.    ?? nitrofurantoin, macrocrystal-monohydrate, (MACROBID) 100 MG capsule Take 100 mg by mouth daily.    ?? nystatin (MYCOSTATIN) 100000 UNIT/ML suspension Take 5 mLs (500,000 Units total) by mouth 4 (four) times daily. 60 mL 2  ?? olopatadine (PATANOL) 0.1 % ophthalmic solution Place 1 drop into both eyes daily as needed for  allergies.    ?? omeprazole (PRILOSEC) 40 MG capsule Take 1 capsule (40 mg total) by mouth 2 (two) times daily. 180 capsule 3  ?? oxyCODONE-acetaminophen (PERCOCET) 10-325 MG tablet TAKE 1 TAB EVERY 4 HOURS FOR 5 DAYS THEN 1 TAB TWICE A DAY AS NEEDED FOR SEVERE BREAKTHROUGH PAIN  0  ?? primidone (MYSOLINE) 50 MG tablet Take 1 tablet (50 mg total) by mouth 2 (two) times daily. 180 tablet 1  ?? Propylene Glycol (SYSTANE BALANCE) 0.6 % SOLN Place 1 drop into both eyes daily as needed (dry eyes).    ?? SPIRIVA RESPIMAT 2.5 MCG/ACT AERS USE 1 INHALATION BY MOUTH  TWICE DAILY 12 g 3  ?? Vitamin D, Ergocalciferol, (DRISDOL) 50000 units CAPS capsule Take 50,000 Units by mouth every Wednesday.    ? ?No facility-administered medications prior to visit.  ? ? ?Allergies  ?Allergen Reactions  ?? Beta Vulgaris Anaphylaxis  ?  Beets   ?? Penicillins Shortness Of Breath  ?? Celebrex [Celecoxib]   ?  "because of stomach ulcers"  ?? Chantix [Varenicline]   ?  Makes pt sick on her stomach   ?? Dog Epithelium Allergy Skin Test   ?  Cats and dogs  ?? Fish Oil Other (See Comments)  ?  hemorrhoids  ?? Gramineae Pollens   ?? Molds & Smuts   ?? Nsaids Other (See Comments)  ?  GI Upset. ?Ulcers  ?? Nicotrol [Nicotine] Nausea Only  ? ? ?Review of Systems  ?Constitutional:  Negative for chills, fatigue and fever.  ?HENT:  Positive for rhinorrhea and sore throat. Negative for congestion, ear pain, postnasal drip, sinus pressure and sinus pain.   ?Respiratory:  Positive for cough. Negative for shortness of breath.   ?Cardiovascular:  Negative for chest pain.   ?Allergic/Immunologic: Positive for environmental allergies.  ? ?   ?Objective:  ?  ?Physical Exam ?Vitals reviewed.  ?HENT:  ?   Left Ear: Tenderness present. Tympanic membrane is erythematous.  ?   Ears:  ?   Comments: Right

## 2021-09-26 ENCOUNTER — Other Ambulatory Visit: Payer: Self-pay | Admitting: Family Medicine

## 2021-10-03 ENCOUNTER — Telehealth: Payer: Self-pay

## 2021-10-03 NOTE — Progress Notes (Signed)
? ? ?Chronic Care Management ?Pharmacy Assistant  ? ?Name: Connie Coffey  MRN: [id number] DOB: [birth year] ? ? ?Reason for Encounter: General Adherence Call  ?  ?Recent office visits:  ?09/21/21 [name] NP. Seen for COPD. Started on Doxycycline Hyclate '100mg'$ . ? ?09/06/21 [name] NP. Seen for Thrush. Started on Fluconazole '200mg'$  and Nystatin 500,000 units.  ? ?Recent consult visits:  ?None ? ?Hospital visits:  ?None ? ?Medications: ?Outpatient Encounter Medications as of 10/03/2021  ?Medication Sig  ? Abaloparatide (TYMLOS) 3120 MCG/1.56ML SOPN Inject 80 mcg into the skin daily.  ? albuterol (VENTOLIN HFA) 108 (90 Base) MCG/ACT inhaler Inhale 2 puffs into the lungs every 4 (four) hours as needed.  ? atorvastatin (LIPITOR) 40 MG tablet TAKE 1 TABLET BY MOUTH  DAILY  ? bethanechol (URECHOLINE) 25 MG tablet Take 1 tablet (25 mg total) by mouth in the morning and at bedtime.  ? busPIRone (BUSPAR) 30 MG tablet TAKE 1 TABLET BY MOUTH 2 TIMES DAILY.  ? cetirizine (ZYRTEC) 10 MG tablet Take 1 tablet (10 mg total) by mouth at bedtime.  ? clonazePAM (KLONOPIN) 0.5 MG tablet TAKE 1 TABLET BY MOUTH TWICE A DAY  ? cyclobenzaprine (FLEXERIL) 10 MG tablet TAKE 1 TABLET BY MOUTH 3  TIMES DAILY  ? D-Mannose 500 MG CAPS Take 500 mg by mouth in the morning and at bedtime.  ? diclofenac Sodium (VOLTAREN) 1 % GEL Apply 20 g topically 4 (four) times daily. (Patient taking differently: Apply 2 g topically 4 (four) times daily as needed (pain).)  ? doxycycline (VIBRA-TABS) 100 MG tablet Take 1 tablet (100 mg total) by mouth 2 (two) times daily.  ? DULoxetine (CYMBALTA) 60 MG capsule Take 1 capsule (60 mg total) by mouth daily.  ? DYMISTA 137-50 MCG/ACT SUSP One spray each nostril twice a day.  ? fenofibrate 160 MG tablet Take 1 tablet (160 mg total) by mouth daily.  ? fluconazole (DIFLUCAN) 200 MG tablet Take 1 tablet (200 mg total) by mouth daily. Take for 2 days  ? gabapentin (NEURONTIN) 600 MG tablet TAKE 1 TABLET BY MOUTH 4   TIMES DAILY  ? guaiFENesin (MUCINEX) 600 MG 12 hr tablet Take 600 mg by mouth 2 (two) times daily as needed for cough or to loosen phlegm.  ? hydrocortisone (CORTEF) 10 MG tablet Take 10 mg by mouth 2 (two) times daily.  ? Insulin Pen Needle (GLOBAL EASY GLIDE PEN NEEDLES) 32G X 4 MM MISC Use one needle daily as directed  ? levothyroxine (SYNTHROID) 75 MCG tablet TAKE 1 TABLET BY MOUTH DAILY BEFORE BREAKFAST.  ? lubiprostone (AMITIZA) 8 MCG capsule TAKE 1 CAPSULE BY MOUTH TWICE  DAILY WITH MEALS  ? montelukast (SINGULAIR) 10 MG tablet Take 1 tablet (10 mg total) by mouth daily.  ? Multiple Vitamin (MULTI-VITAMINS) TABS Take 1 tablet by mouth daily.  ? nitrofurantoin, macrocrystal-monohydrate, (MACROBID) 100 MG capsule Take 100 mg by mouth daily.  ? nystatin (MYCOSTATIN) 100000 UNIT/ML suspension Take 5 mLs (500,000 Units total) by mouth 4 (four) times daily.  ? olopatadine (PATANOL) 0.1 % ophthalmic solution Place 1 drop into both eyes daily as needed for allergies.  ? omeprazole (PRILOSEC) 40 MG capsule Take 1 capsule (40 mg total) by mouth 2 (two) times daily.  ? oxyCODONE-acetaminophen (PERCOCET) 10-325 MG tablet TAKE 1 TAB EVERY 4 HOURS FOR 5 DAYS THEN 1 TAB TWICE A DAY AS NEEDED FOR SEVERE BREAKTHROUGH PAIN  ? primidone (MYSOLINE) 50 MG tablet TAKE 1 TABLET BY MOUTH TWICE  A DAY  ? Propylene Glycol (SYSTANE BALANCE) 0.6 % SOLN Place 1 drop into both eyes daily as needed (dry eyes).  ? SPIRIVA RESPIMAT 2.5 MCG/ACT AERS USE 1 INHALATION BY MOUTH  TWICE DAILY  ? Vitamin D, Ergocalciferol, (DRISDOL) 50000 units CAPS capsule Take 50,000 Units by mouth every Wednesday.  ? ?No facility-administered encounter medications on file as of 10/03/2021.  ? ? ?Contacted Vangie Bicker Trueba for General Review Call ? ? ?Chart Review: ? ?Have there been any documented new, changed, or discontinued medications since last visit? No  ?Has there been any documented recent hospitalizations or ED visits since last visit with Clinical Pharmacist?  Yes ?Brief Summary (including medication and/or Diagnosis changes): Pt has surgery on 07/21/21 to have a spinal cord stimulator placed.  ? ? ?Adherence Review: ? ?Does the Clinical Pharmacist Assistant have access to adherence rates? Yes ?Adherence rates for STAR metric medications (List medication(s)/day supply/ last 2 fill dates).Atorvastatin '40mg'$  08/29/21 100ds, 05/31/21 90ds ?Does the patient have >5 day gap between last estimated fill dates for any of the above medications or other medication gaps? No ?Reason for medication gaps.None ? ? ?Disease State Questions: ? ?Able to connect with Patient? Yes ?Did patient have any problems with their health recently? No ?Note problems and Concerns: ?Have you had any admissions or emergency room visits or worsening of your condition(s) since last visit? Yes ?Details of ED visit, hospital visit and/or worsening condition(s):Pt had back surgery on 07/21/21 and had a spinal cord stimulator placement  ?Have you had any visits with new specialists or providers since your last visit? Yes ?Explain:Yes she shes a provider in the pain clinic  ?Have you had any new health care problem(s) since your last visit? No ?New problem(s) reported: ?Have you run out of any of your medications since you last spoke with clinical pharmacist? No ?What caused you to run out of your medications? ?Are there any medications you are not taking as prescribed? No ?What kept you from taking your medications as prescribed? ?Are you having any issues or side effects with your medications? No ?Note of issues or side effects: ?Do you have any other health concerns or questions you want to discuss with your Clinical Pharmacist before your next visit? Yes, pt wants to discuss pain in her legs. F/u scheduled for 11/15/21 '@1'$ :00pm ?Note additional concerns and questions from Patient. ?Are there any health concerns that you feel we can do a better job addressing? No ?Note Patient's response. ?Are you having any problems  with any of the following since the last visit: (select all that apply) ? Ambulating/Walking and Climbing stairs ? Details:Pt had back surgery in February  ?12. Any falls since last visit? No ? Details: ?13. Any increased or uncontrolled pain since last visit? Yes ? Details:Pt stated around her lower back since her back surgery.She is unable to do a lot of standing for Carmer periods of time. Pain level is 7 out of 10. Pt will go back and see the provider in the pain clinic in a month.  ?14. Next visit Type: Telephone  ?      Visit with: [name] ?       Date:11/15/21 ?       Time:1:00pm ? ?15. Additional Details? No  ? ?[name], CMA ?Clinical Pharmacist Assistant  ?[phone] ?

## 2021-10-04 ENCOUNTER — Other Ambulatory Visit: Payer: Self-pay

## 2021-10-04 MED ORDER — DYMISTA 137-50 MCG/ACT NA SUSP: NASAL | 1 refills | Status: DC

## 2021-10-23 NOTE — Progress Notes (Incomplete)
Connie Coffey  [address] [place],  [place]  [address] [phone]  Clinic Day:  10/23/2021  Referring physician: [name], MD  HISTORY OF PRESENT ILLNESS:  The patient is a 66 y.o. female with chronic leukocytosis and thrombocythemia.  Extensive lab work in the past did not reveal any evidence of a myeloproliferative disorder being present.  She comes in today to review her peripheral counts.  Since her last visit, the patient has been doing okay.  She is currently dealing with lower back problems which may ultimately require her to undergo surgery.  As it pertains to her clinical counts, she denies having any B symptoms which concern her for her elevated counts being due to an underlying hematologic malignancy.   PHYSICAL EXAM:  There were no vitals taken for this visit. Wt Readings from Last 3 Encounters:  09/21/21 139 lb (63 kg)  09/06/21 142 lb (64.4 kg)  08/07/21 142 lb (64.4 kg)   There is no height or weight on file to calculate BMI. Performance status (ECOG): 1 - Symptomatic but completely ambulatory Physical Exam Constitutional:      Appearance: Normal appearance. She is not ill-appearing.  HENT:     Mouth/Throat:     Mouth: Mucous membranes are moist.     Pharynx: Oropharynx is clear. No oropharyngeal exudate or posterior oropharyngeal erythema.  Cardiovascular:     Rate and Rhythm: Normal rate and regular rhythm.     Heart sounds: No murmur heard.   No friction rub. No gallop.  Pulmonary:     Effort: Pulmonary effort is normal. No respiratory distress.     Breath sounds: Normal breath sounds. No wheezing, rhonchi or rales.  Abdominal:     General: Bowel sounds are normal. There is no distension.     Palpations: Abdomen is soft. There is no mass.     Tenderness: There is no abdominal tenderness.  Musculoskeletal:        General: No swelling.     Right lower leg: No edema.     Left lower leg: No edema.  Lymphadenopathy:      Cervical: No cervical adenopathy.     Upper Body:     Right upper body: No supraclavicular or axillary adenopathy.     Left upper body: No supraclavicular or axillary adenopathy.     Lower Body: No right inguinal adenopathy. No left inguinal adenopathy.  Skin:    General: Skin is warm.     Coloration: Skin is not jaundiced.     Findings: No lesion or rash.  Neurological:     General: No focal deficit present.     Mental Status: She is alert and oriented to person, place, and time. Mental status is at baseline.  Psychiatric:        Mood and Affect: Mood normal.        Behavior: Behavior normal.        Thought Content: Thought content normal.   LABS:    ASSESSMENT & PLAN:  A 66 y.o. female with leukocytosis and thrombocythemia.  Although her white cells and platelets remain high, they are not much different than what they have been over these past few visits.  None of her peripheral counts is alarmingly high to where intervention appears necessary.  Her leukocytosis and thrombocythemia will continued to be followed conservatively.  I will see her back in 6 months for repeat clinical assessment.  If her counts precipitously rise over time, a bone  marrow biopsy would be considered.  The patient understands all the plans discussed today and is in agreement with them.   I, Rita Ohara, am acting as scribe for Marice Potter, MD    I have reviewed this report as typed by the medical scribe, and it is complete and accurate.  Vallorie Niccoli Macarthur Critchley, MD

## 2021-10-24 ENCOUNTER — Ambulatory Visit: Payer: Medicare Other | Admitting: Oncology

## 2021-10-24 ENCOUNTER — Other Ambulatory Visit: Payer: Medicare Other

## 2021-10-25 DIAGNOSIS — G894 Chronic pain syndrome: Secondary | ICD-10-CM | POA: Diagnosis not present

## 2021-10-25 DIAGNOSIS — M961 Postlaminectomy syndrome, not elsewhere classified: Secondary | ICD-10-CM | POA: Diagnosis not present

## 2021-10-25 DIAGNOSIS — Z9689 Presence of other specified functional implants: Secondary | ICD-10-CM | POA: Diagnosis not present

## 2021-10-26 ENCOUNTER — Other Ambulatory Visit: Payer: Self-pay | Admitting: Family Medicine

## 2021-10-30 DIAGNOSIS — Z8639 Personal history of other endocrine, nutritional and metabolic disease: Secondary | ICD-10-CM | POA: Diagnosis not present

## 2021-10-30 DIAGNOSIS — E559 Vitamin D deficiency, unspecified: Secondary | ICD-10-CM | POA: Diagnosis not present

## 2021-10-30 DIAGNOSIS — M810 Age-related osteoporosis without current pathological fracture: Secondary | ICD-10-CM | POA: Diagnosis not present

## 2021-10-30 DIAGNOSIS — E2740 Unspecified adrenocortical insufficiency: Secondary | ICD-10-CM | POA: Diagnosis not present

## 2021-10-30 DIAGNOSIS — E039 Hypothyroidism, unspecified: Secondary | ICD-10-CM | POA: Diagnosis not present

## 2021-11-01 ENCOUNTER — Other Ambulatory Visit: Payer: Self-pay | Admitting: Physician Assistant

## 2021-11-02 ENCOUNTER — Other Ambulatory Visit: Payer: Self-pay | Admitting: Family Medicine

## 2021-11-02 ENCOUNTER — Other Ambulatory Visit: Payer: Self-pay | Admitting: Legal Medicine

## 2021-11-02 NOTE — Progress Notes (Signed)
Connie Coffey  [address] [place],  [place]  [address] [phone]  Clinic Day: 11/03/2021  Referring physician: [name], MD  HISTORY OF PRESENT ILLNESS:  The patient is a 66 y.o. female with chronic leukocytosis and thrombocythemia.  Extensive lab work in the past did not reveal any evidence of a myeloproliferative disorder being present.  She comes in today to reassess her peripheral counts.  Since her last visit, the patient has been doing okay.  As it pertains to her peripheral counts, she denies having any B symptoms which concern her for her elevated counts being due to an underlying hematologic malignancy.   PHYSICAL EXAM:  Blood pressure 139/67, pulse 91, temperature 98.5 F (36.9 C), resp. rate 16, height '5\' 3"'$  (1.6 m), weight 138 lb 11.2 oz (62.9 kg), SpO2 94 %. Wt Readings from Last 3 Encounters:  11/03/21 138 lb 11.2 oz (62.9 kg)  09/21/21 139 lb (63 kg)  09/06/21 142 lb (64.4 kg)   Body mass index is 24.57 kg/m. Performance status (ECOG): 1 - Symptomatic but completely ambulatory Physical Exam Constitutional:      Appearance: Normal appearance. She is not ill-appearing.  HENT:     Mouth/Throat:     Mouth: Mucous membranes are moist.     Pharynx: Oropharynx is clear. No oropharyngeal exudate or posterior oropharyngeal erythema.  Cardiovascular:     Rate and Rhythm: Normal rate and regular rhythm.     Heart sounds: No murmur heard.    No friction rub. No gallop.  Pulmonary:     Effort: Pulmonary effort is normal. No respiratory distress.     Breath sounds: Normal breath sounds. No wheezing, rhonchi or rales.  Abdominal:     General: Bowel sounds are normal. There is no distension.     Palpations: Abdomen is soft. There is no mass.     Tenderness: There is no abdominal tenderness.  Musculoskeletal:        General: No swelling.     Right lower leg: No edema.     Left lower leg: No edema.  Lymphadenopathy:     Cervical: No  cervical adenopathy.     Upper Body:     Right upper body: No supraclavicular or axillary adenopathy.     Left upper body: No supraclavicular or axillary adenopathy.     Lower Body: No right inguinal adenopathy. No left inguinal adenopathy.  Skin:    General: Skin is warm.     Coloration: Skin is not jaundiced.     Findings: No lesion or rash.  Neurological:     General: No focal deficit present.     Mental Status: She is alert and oriented to person, place, and time. Mental status is at baseline.  Psychiatric:        Mood and Affect: Mood normal.        Behavior: Behavior normal.        Thought Content: Thought content normal.    LABS:    ASSESSMENT & PLAN:  A 66 y.o. female with leukocytosis and thrombocythemia.  Although her white cells and platelets remain elevated, they are lower than what they have been previously.  This is encouraging for a bone marrow process likely not being present.  Clinically, she appears to be doing well.   I will see her back in 6 months for repeat clinical assessment.  The patient understands all the plans discussed today and is in agreement with them.  Rashmi Tallent A  Bobby Rumpf, MD

## 2021-11-03 ENCOUNTER — Other Ambulatory Visit: Payer: Self-pay

## 2021-11-03 ENCOUNTER — Inpatient Hospital Stay: Payer: Medicare Other | Attending: Oncology | Admitting: Oncology

## 2021-11-03 ENCOUNTER — Inpatient Hospital Stay: Payer: Medicare Other

## 2021-11-03 ENCOUNTER — Other Ambulatory Visit: Payer: Self-pay | Admitting: Oncology

## 2021-11-03 ENCOUNTER — Other Ambulatory Visit: Payer: Self-pay | Admitting: Hematology and Oncology

## 2021-11-03 VITALS — BP 139/67 | HR 91 | Temp 98.5°F | Resp 16 | Ht 63.0 in | Wt 138.7 lb

## 2021-11-03 DIAGNOSIS — D72829 Elevated white blood cell count, unspecified: Secondary | ICD-10-CM

## 2021-11-03 DIAGNOSIS — D75839 Thrombocytosis, unspecified: Secondary | ICD-10-CM

## 2021-11-03 LAB — CBC AND DIFFERENTIAL
HCT: 42 (ref 36–46)
Hemoglobin: 13.1 (ref 12.0–16.0)
Neutrophils Absolute: 9.05
Platelets: 483 10*3/uL — AB (ref 150–400)
WBC: 12.4

## 2021-11-03 LAB — CBC
MCV: 87 (ref 80–94)
RBC: 4.76 (ref 3.87–5.11)

## 2021-11-05 ENCOUNTER — Other Ambulatory Visit: Payer: Self-pay | Admitting: Family Medicine

## 2021-11-07 ENCOUNTER — Other Ambulatory Visit: Payer: Self-pay | Admitting: Legal Medicine

## 2021-11-15 ENCOUNTER — Ambulatory Visit (INDEPENDENT_AMBULATORY_CARE_PROVIDER_SITE_OTHER): Payer: Medicare Other

## 2021-11-15 NOTE — Patient Instructions (Signed)
Visit Information   Goals Addressed   None    Patient Care Plan: CCM Pharmacy Care Plan     Problem Identified: copd, htn, hld, osteoporosis   Priority: High  Onset Date: 10/06/2020     [place] Goal: Disease State Management   Start Date: 10/06/2020  Expected End Date: 10/06/2021  Recent Progress: On track  Priority: High  Note:    Current Barriers:  Unable to achieve control of osteoporosis   Pharmacist Clinical Goal(s):  Patient will achieve control of osteoporosis as evidenced by dexa scan through collaboration with PharmD and provider.   Interventions: 1:1 collaboration with Cox, Kirsten, MD regarding development and update of comprehensive plan of care as evidenced by provider attestation and co-signature Inter-disciplinary care team collaboration (see longitudinal plan of care) Comprehensive medication review performed; medication list updated in electronic medical record  Hyperlipidemia: (LDL goal < 55) -Not ideally controlled -Current treatment: atorvastatin 40 mg daily Appropriate, Effective, Safe, Accessible Fenofibrate 160 mg daily Appropriate, Effective, Safe, Accessible -Medications previously tried: none reported  -Current dietary patterns:  barbecue chicken, red potatoes, green beans, carrots for supper the other night. Fruit. Avoids fried foods.  -Current exercise habits: limited due to pinched nerve -Educated on Cholesterol goals;  Benefits of statin for ASCVD risk reduction; Importance of limiting foods high in cholesterol; -Counseled on diet and exercise extensively Recommended to continue current medication  COPD (Goal: control symptoms and prevent exacerbations) -Controlled -Current treatment  albuterol inhaler 2 puffs every 4 hours prn - using 2-3 times weekly during allergy season Appropriate, Effective, Safe, Accessible Dymista 137-50 mcg/act 1 spray each nostril twice daily Appropriate, Effective, Safe, Accessible Montelukast 10 mg daily  Appropriate, Effective, Safe, Accessible Spiriva 2.5 mcg/act 1 puff twice daily Appropriate, Effective, Safe, Accessible Cetirizine '10mg'$  Query Appropriate,  Levocetirizine '5mg'$  Query Appropriate,  -Medications previously tried:  Librarian, academic, cetirizine, Trelegy,  -Gold Grade: Gold 2 (FEV1 50-79%) -Pulmonary function testing: ratio 53%, FEV1 56%, FVC 84% -Exacerbations requiring treatment in last 6 months: none -Patient reports consistent use of maintenance inhaler -Frequency of rescue inhaler use: 2-3 times weekly during allergy season  -Counseled on Proper inhaler technique; Benefits of consistent maintenance inhaler use June 2023: Recommend patient use Cetirizine or Levocetirizine. She said she'd prefer Levo since she is out of Cetirizine. Updated medslist  Depression/Anxiety (Goal: manage symptoms of depression) -Not ideally controlled -Current treatment: buspirone 30 mg bid Appropriate, Effective, Safe, Accessible duloxetine 60 mg daily Appropriate, Effective, Safe, Accessible Clonazepam 0.5 mg bid Appropriate, Effective, Safe, Accessible -Medications previously tried/failed: paroxetine, trazodone  -PHQ9:     05/08/2021   10:39 AM 02/06/2021   10:56 AM 02/01/2021    9:53 AM  Depression screen PHQ 2/9  Decreased Interest 0 0 0  Down, Depressed, Hopeless 0 1 1  PHQ - 2 Score 0 1 1  Altered sleeping  1 1  Tired, decreased energy  1 1  Change in appetite  2 1  Feeling bad or failure about yourself   0 0  Trouble concentrating  0 0  Moving slowly or fidgety/restless  0 0  Suicidal thoughts  0 0  PHQ-9 Score  5 4  Difficult doing work/chores  Somewhat difficult Somewhat difficult   -Educated on Benefits of medication for symptom control -Recommended to continue current medication November 2022: Unable to conduct PHQ9 today, each time I started patient went on tangent about her house or son. Was unable to go over individual meds for the same reason June 2023: Unable to  conduct PHQ9.  Patient's BF broke up with her and unable to discuss  Tobacco use (Goal reduce smoking) -Uncontrolled -Previous quit attempts: cold [place], chantix (made her sick), patches -Current treatment  None reported -Patient smokes Within 30 minutes of waking -Patient triggers include: stress and anxiety -On a scale of 1-10, reports MOTIVATION to quit is 1 -On a scale of 1-10, reports CONFIDENCE in quitting is 1 -Provided contact information for [place] [facility] (1-800-QUIT-NOW) and encouraged patient to reach out to this group for support. -Counseled on importance of smoking cessation.   Osteoporosis / Osteopenia (Goal reduce risk of fracture ) -Managed by Peri Jefferson, Endo -Uncontrolled -Last DEXA Scan:  04/06/2020  T-Score femoral neck: -2.1   T-Score forearm radius:  - 3.9  -Patient is a candidate for pharmacologic treatment due to T-Score < -2.5 in femoral neck -Current treatment  Vitamin D 50,000 units weekly Appropriate, Effective, Safe, Accessible Tymlos (July 1st, 2022) Appropriate, Effective, Safe, Accessible -Medications previously tried:  Actor, Alendronate (August 2018-June 2022) -Recommend [phone] units of vitamin D daily. Recommend 1200 mg of calcium daily from dietary and supplemental sources. Counseled on oral bisphosphonate administration: take in the morning, 30 minutes prior to food with 6-8 oz of water. Do not lie down for at least 30 minutes after taking. Recommend weight-bearing and muscle strengthening exercises for building and maintaining bone density.  -Counseled on diet and exercise extensively Recommended to continue current medication Counseled on calcium supplementation  November 2022: Patient unsure of Alendronate start date but will defer to specialist June 2023: Defer to specialist   Hypothyroidism (Goal: TSH 2-3) -Not ideally controlled -Current treatment  levothyroxine 75 mcg daily in the morning  Appropriate, Effective, Safe, Accessible -Medications  previously tried: none reported  -Recommended considering dose adjustment for goal TSH 2-3.      Patient Goals/Self-Care Activities Patient will:  - take medications as prescribed focus on medication adherence by using pill box  Follow Up Plan: Telephone follow up appointment with care management team member scheduled for: Jan 2024  [name], Pharm.D. - [phone]      Ms. Emmick was given information about Chronic Care Management services today including:  CCM service includes personalized support from designated clinical staff supervised by her physician, including individualized plan of care and coordination with other care providers 24/7 contact phone numbers for assistance for urgent and routine care needs. Standard insurance, coinsurance, copays and deductibles apply for chronic care management only during months in which we provide at least 20 minutes of these services. Most insurances cover these services at 100%, however patients may be responsible for any copay, coinsurance and/or deductible if applicable. This service may help you avoid the need for more expensive face-to-face services. Only one practitioner may furnish and bill the service in a calendar month. The patient may stop CCM services at any time (effective at the end of the month) by phone call to the office staff.  Patient agreed to services and verbal consent obtained.   The patient verbalized understanding of instructions, educational materials, and care plan provided today and DECLINED offer to receive copy of patient instructions, educational materials, and care plan.  The pharmacy team will reach out to the patient again over the next 60 days.   Lane Hacker, [place]

## 2021-11-15 NOTE — Progress Notes (Cosign Needed)
Chronic Care Management Pharmacy Note  11/15/2021 Name:  Connie Coffey MRN:  [id number] DOB:  Sep 19, 1955   Plan Updates:  Patient very confused regarding meds. She was reading from a medslist that was old and had meds she was no longer taking. Once she started looking at the bottles, things matched up  Subjective: Connie Coffey is an 66 y.o. year old female who is a primary patient of Cox, Kirsten, MD.  The CCM team was consulted for assistance with disease management and care coordination needs.    Engaged with patient by telephone for follow up visit in response to provider referral for pharmacy case management and/or care coordination services.   Consent to Services:  The patient was given the following information about Chronic Care Management services today, agreed to services, and gave verbal consent: 1. CCM service includes personalized support from designated clinical staff supervised by the primary care provider, including individualized plan of care and coordination with other care providers 2. 24/7 contact phone numbers for assistance for urgent and routine care needs. 3. Service will only be billed when office clinical staff spend 20 minutes or more in a month to coordinate care. 4. Only one practitioner may furnish and bill the service in a calendar month. 5.The patient may stop CCM services at any time (effective at the end of the month) by phone call to the office staff. 6. The patient will be responsible for cost sharing (co-pay) of up to 20% of the service fee (after annual deductible is met). Patient agreed to services and consent obtained.  Patient Care Team: [name], MD as PCP - General (Family Medicine) Revankar, Reita Cliche, MD as PCP - Cardiology (Cardiology) Jackquline Denmark, MD as PCP - Gastroenterology (Gastroenterology) Eustace Moore, MD as Consulting Physician (Neurosurgery) Landis Martins, DPM as Consulting Physician (Podiatry) Joie Bimler, MD as Consulting  Physician (Urology) Marice Potter, MD as Consulting Physician (Oncology) Lane Hacker, [facility] (Pharmacist)  Recent office visits:  09/21/21 [name] NP. Seen for COPD. Started on Doxycycline Hyclate 132m.   09/06/21 HJerrell BelfastNP. Seen for Thrush. Started on Fluconazole 2067mand Nystatin 500,000 units.    Recent consult visits:  None   Hospital visits:  None    Objective:  Lab Results  Component Value Date   CREATININE 0.75 08/07/2021   BUN 13 08/07/2021   GFRNONAA >60 07/21/2021   GFRAA 91 04/26/2020   NA 143 08/07/2021   K 4.1 08/07/2021   CALCIUM 9.8 08/07/2021   CO2 24 08/07/2021   GLUCOSE 88 08/07/2021    No results found for: "HGBA1C", "FRUCTOSAMINE", "GFR", "MICROALBUR"  Last diabetic Eye exam: No results found for: "HMDIABEYEEXA"  Last diabetic Foot exam: No results found for: "HMDIABFOOTEX"   Lab Results  Component Value Date   CHOL 159 08/07/2021   HDL 65 08/07/2021   LDLCALC 71 08/07/2021   TRIG 133 08/07/2021   CHOLHDL 2.4 08/07/2021       Latest Ref Rng & Units 08/07/2021   10:08 AM 05/08/2021   11:37 AM 02/01/2021   10:40 AM  Hepatic Function  Total Protein 6.0 - 8.5 g/dL 6.1  6.5  6.2   Albumin 3.8 - 4.8 g/dL 4.2  4.4  4.3   AST 0 - 40 IU/L '18  19  14   ' ALT 0 - 32 IU/L '14  13  11   ' Alk Phosphatase 44 - 121 IU/L 54  44  42   Total Bilirubin 0.0 -  1.2 mg/dL 0.3  <0.2  <0.2     Lab Results  Component Value Date/Time   TSH 4.790 (H) 08/07/2021 10:08 AM   TSH 1.550 02/01/2021 10:40 AM   FREET4 1.33 07/26/2020 11:17 AM       Latest Ref Rng & Units 11/03/2021   12:00 AM 08/07/2021   10:08 AM 07/21/2021    6:25 AM  CBC  WBC  12.4     9.7  10.1   Hemoglobin 12.0 - 16.0 13.1     13.2  13.4   Hematocrit 36 - 46 42     39.6  41.5   Platelets 150 - 400 K/uL 483     507  429      This result is from an external source.    Lab Results  Component Value Date/Time   VD25OH 49.3 05/08/2021 11:37 AM   VD25OH 55.0 07/26/2020 11:17  AM    Clinical ASCVD: No  The 10-year ASCVD risk score (Arnett DK, et al., 2019) is: 11.9%   Values used to calculate the score:     Age: 62 years     Sex: Female     Is Non-Hispanic African American: No     Diabetic: No     Tobacco smoker: Yes     Systolic Blood Pressure: 166 mmHg     Is BP treated: No     HDL Cholesterol: 65 mg/dL     Total Cholesterol: 159 mg/dL       05/08/2021   10:39 AM 02/06/2021   10:56 AM 02/01/2021    9:53 AM  Depression screen PHQ 2/9  Decreased Interest 0 0 0  Down, Depressed, Hopeless 0 1 1  PHQ - 2 Score 0 1 1  Altered sleeping  1 1  Tired, decreased energy  1 1  Change in appetite  2 1  Feeling bad or failure about yourself   0 0  Trouble concentrating  0 0  Moving slowly or fidgety/restless  0 0  Suicidal thoughts  0 0  PHQ-9 Score  5 4  Difficult doing work/chores  Somewhat difficult Somewhat difficult     Social History   Tobacco Use  Smoking Status Every Day   Packs/day: 1.00   Types: Cigarettes  Smokeless Tobacco Never   BP Readings from Last 3 Encounters:  11/03/21 139/67  09/21/21 136/76  09/06/21 118/68   Pulse Readings from Last 3 Encounters:  11/03/21 91  09/21/21 (!) 103  09/06/21 97   Wt Readings from Last 3 Encounters:  11/03/21 138 lb 11.2 oz (62.9 kg)  09/21/21 139 lb (63 kg)  09/06/21 142 lb (64.4 kg)   BMI Readings from Last 3 Encounters:  11/03/21 24.57 kg/m  09/21/21 24.62 kg/m  09/06/21 26.83 kg/m    Assessment/Interventions: Review of patient past medical history, allergies, medications, health status, including review of consultants reports, laboratory and other test data, was performed as part of comprehensive evaluation and provision of chronic care management services.   SDOH:  (Social Determinants of Health) assessments and interventions performed: Yes SDOH Interventions    Flowsheet Row Most Recent Value  SDOH Interventions   Financial Strain Interventions Intervention Not Indicated   Physical Activity Interventions Other (Comments)  [Patient requires wheelchair to get around]      SDOH Screenings   Alcohol Screen: Low Risk  (06/30/2019)   Alcohol Screen    Last Alcohol Screening Score (AUDIT): 0  Depression (PHQ2-9): Low Risk  (05/08/2021)  Depression (PHQ2-9)    PHQ-2 Score: 0  Financial Resource Strain: Low Risk  (11/15/2021)   Overall Financial Resource Strain (CARDIA)    Difficulty of Paying Living Expenses: Not hard at all  Food Insecurity: No Food Insecurity (06/30/2019)   Hunger Vital Sign    Worried About [place] in the Last Year: Never true    [place] in the Last Year: Never true  Housing: Low Risk  (10/06/2020)   Housing    Last Housing Risk Score: 0  Physical Activity: Inactive (11/15/2021)   Exercise Vital Sign    Days of Exercise per Week: 0 days    Minutes of Exercise per Session: 0 min  Social Connections: Not on file  Stress: Stress Concern Present (06/30/2019)   [place]    Feeling of Stress : Very much  Tobacco Use: High Risk (09/21/2021)   Patient History    Smoking Tobacco Use: Every Day    Smokeless Tobacco Use: Never    Passive Exposure: Not on file  Transportation Needs: Unmet Transportation Needs (04/12/2021)   PRAPARE - Hydrologist (Medical): Yes    Lack of Transportation (Non-Medical): Yes    CCM Care Plan  Allergies  Allergen Reactions   Beta Vulgaris Anaphylaxis    Beets    Penicillins Shortness Of Breath   Celebrex [Celecoxib]     "because of stomach ulcers"   Chantix [Varenicline]     Makes pt sick on her stomach    Dog Epithelium Allergy Skin Test     Cats and dogs   Fish Oil Other (See Comments)    hemorrhoids   Gramineae Pollens    Molds & Smuts    Nsaids Other (See Comments)    GI Upset. Ulcers   Nicotrol [Nicotine] Nausea Only    Medications Reviewed Today     Reviewed by Lane Hacker, [facility] (Pharmacist) on 11/15/21 at 1455  Med List Status: <None>   Medication Order Taking? Sig Documenting Provider Last Dose Status Informant  Abaloparatide (TYMLOS) 3120 MCG/1.56ML SOPN [id number]  Inject 80 mcg into the skin daily. Lillard Anes, MD  Active Self  albuterol (VENTOLIN HFA) 108 [phone] Base) MCG/ACT inhaler [id number] Yes Inhale 2 puffs into the lungs every 4 (four) hours as needed. Lillard Anes, MD Taking Active Self  atorvastatin (LIPITOR) 40 MG tablet [id number] Yes TAKE 1 TABLET BY MOUTH  DAILY Cox, Kirsten, MD Taking Active Self  bethanechol (URECHOLINE) 25 MG tablet [id number] Yes Take 1 tablet (25 mg total) by mouth in the morning and at bedtime. Lillard Anes, MD Taking Active Self  busPIRone (BUSPAR) 30 MG tablet [id number] Yes TAKE 1 TABLET BY MOUTH TWICE A DAY Marge Duncans, PA-C Taking Active   cetirizine (ZYRTEC) 10 MG tablet [id number] No Take 1 tablet (10 mg total) by mouth at bedtime.  Patient not taking: Reported on 11/15/2021   [name], MD Not Taking Consider Medication Status and Discontinue   clonazePAM (KLONOPIN) 0.5 MG tablet [id number] Yes TAKE 1 TABLET BY MOUTH TWICE A DAY Cox, Kirsten, MD Taking Active   cyclobenzaprine (FLEXERIL) 10 MG tablet [id number] Yes TAKE 1 TABLET BY MOUTH 3  TIMES DAILY Cox, Kirsten, MD Taking Active   D-Mannose 500 MG CAPS [id number] Yes Take 500 mg by mouth in the morning and at bedtime. [provider] Taking Active Self  diclofenac Sodium (VOLTAREN) 1 % GEL  [id number]  Apply 20 g topically 4 (four) times daily.  Patient taking differently: Apply 2 g topically 4 (four) times daily as needed (pain).   Rip Harbour, NP  Active Self  doxycycline (VIBRA-TABS) 100 MG tablet [id number]  Take 1 tablet (100 mg total) by mouth 2 (two) times daily. Rip Harbour, NP  Active   DULoxetine (CYMBALTA) 60 MG capsule [id number] Yes TAKE 1 CAPSULE BY MOUTH  DAILY [name], MD Taking Active   DYMISTA 137-50  MCG/ACT SUSP [id number] Yes One spray each nostril twice a day. [name], MD Taking Active   fenofibrate 160 MG tablet [id number] Yes Take 1 tablet (160 mg total) by mouth daily. Cox, Kirsten, MD Taking Active Self  fluconazole (DIFLUCAN) 200 MG tablet [id number]  Take 1 tablet (200 mg total) by mouth daily. Take for 2 days Rip Harbour, NP  Active   gabapentin (NEURONTIN) 600 MG tablet [id number] Yes TAKE 1 TABLET BY MOUTH 4  TIMES DAILY Lillard Anes, MD Taking Active Self  guaiFENesin (MUCINEX) 600 MG 12 hr tablet [id number]  Take 600 mg by mouth 2 (two) times daily as needed for cough or to loosen phlegm. [provider]  Active Self  hydrocortisone (CORTEF) 10 MG tablet [id number]  Take 10 mg by mouth 2 (two) times daily. [provider]  Active Self  Insulin Pen Needle (GLOBAL EASY GLIDE PEN NEEDLES) 32G X 4 MM MISC [id number]  Use one needle daily as directed [provider]  Active Self  levocetirizine (XYZAL) 5 MG tablet [id number] Yes Take 5 mg by mouth every evening. [provider] Taking Active   levothyroxine (SYNTHROID) 75 MCG tablet [id number] Yes TAKE 1 TABLET BY MOUTH EVERY DAY BEFORE BREAKFAST Cox, Kirsten, MD Taking Active   lubiprostone (AMITIZA) 8 MCG capsule [id number] Yes TAKE 1 CAPSULE BY MOUTH TWICE  DAILY WITH MEALS Cox, Kirsten, MD Taking Active   montelukast (SINGULAIR) 10 MG tablet [id number] Yes TAKE 1 TABLET BY MOUTH  DAILY Cox, Kirsten, MD Taking Active   Multiple Vitamin (MULTI-VITAMINS) TABS [phone]  Take 1 tablet by mouth daily. [provider]  Active Self  nitrofurantoin, macrocrystal-monohydrate, (MACROBID) 100 MG capsule [id number] Yes Take 100 mg by mouth daily. [provider] Taking Active Self  nystatin (MYCOSTATIN) 100000 UNIT/ML suspension [id number]  Take 5 mLs (500,000 Units total) by mouth 4 (four) times daily. Rip Harbour, NP  Active   olopatadine (PATANOL) 0.1 % ophthalmic solution  [id number]  Place 1 drop into both eyes daily as needed for allergies. [provider]  Active Self  omeprazole (PRILOSEC) 40 MG capsule [id number] Yes Take 1 capsule (40 mg total) by mouth 2 (two) times daily. Lillard Anes, MD Taking Active Self  oxyCODONE-acetaminophen (PERCOCET) 10-325 MG tablet [id number] Yes TAKE 1 TAB EVERY 4 HOURS FOR 5 DAYS THEN 1 TAB TWICE A DAY AS NEEDED FOR SEVERE BREAKTHROUGH PAIN Dorna Leitz, MD Taking Active Self  primidone (MYSOLINE) 50 MG tablet [id number] Yes TAKE 1 TABLET BY MOUTH TWICE A DAY Marge Duncans, PA-C Taking Active   Propylene Glycol (SYSTANE BALANCE) 0.6 % SOLN [id number]  Place 1 drop into both eyes daily as needed (dry eyes). [provider]  Active Self  [place] 2.5 MCG/ACT AERS [id number]  USE 1 INHALATION BY MOUTH  TWICE DAILY Lillard Anes, MD  Active   Vitamin D, Ergocalciferol, (DRISDOL) 50000 units CAPS capsule [id number]  Take 50,000 Units by mouth every Wednesday. Riccardo Dubin,  PA-C  Active Self            Patient Active Problem List   Diagnosis Date Noted   H/O Spinal surgery 08/20/2021   Nicotine dependence 08/20/2021   GERD (gastroesophageal reflux disease) 08/07/2021   Therapeutic opioid induced constipation 05/26/2021   Steroid-induced osteoporosis 05/26/2021   Absence of bladder continence 94/85/4627   Uncomplicated opioid dependence ([place]) 02/01/2021   Greater trochanteric pain syndrome 11/06/2020   Thrombocythemia 10/10/2020   Trochanteric bursitis, right hip 09/20/2020   Status post cervical spinal fusion 09/14/2020   Simple chronic bronchitis ([place]) 02/16/2020   Mixed hyperlipidemia 10/19/2019   Adrenal insufficiency (Addison's disease) ([place]) 10/19/2019   Sciatica of right side associated with disorder of lumbar spine 07/18/2019   History of colonic polyps 02/16/2019   Thoracic degenerative disc disease 02/09/2019   Lung nodules 07/18/2018   Cigarette smoker  03/11/2018   Chronic pain syndrome 04/15/2017   Depression, major, recurrent, mild ([place]) 03/21/2017   Acquired hypothyroidism 09/22/2015   Hyperparathyroidism ([place]) 09/22/2015   Vitamin D deficiency 09/22/2015   Anxiety state 08/06/2013   Lumbar post-laminectomy syndrome 03/19/2013   Neck pain 01/21/2013   Low back pain 11/21/2012    Immunization History  Administered Date(s) Administered   Fluad Quad(high Dose 65+) 02/01/2021   Influenza-Unspecified 01/26/2018, 02/06/2019, 02/05/2020   PFIZER(Purple Top)SARS-COV-2 Vaccination 01/30/2020, 04/28/2020   Pfizer Covid-19 Vaccine Bivalent Booster 106yr & up 05/08/2021   Pneumococcal Conjugate-13 12/16/2014   Pneumococcal Polysaccharide-23 03/26/2013, 02/06/2021   Td 03/13/2015    Conditions to be addressed/monitored:  Hypertension, Hyperlipidemia, Heart Failure, COPD, Hypothyroidism, Anxiety, Osteoporosis and Chronic Pain, Vitamin D Deficiency  Care Plan : CCM Pharmacy Care Plan  Updates made by KLane Hacker RPH since 11/15/2021 12:00 AM     Problem: copd, htn, hld, osteoporosis   Priority: High  Onset Date: 10/06/2020     [place] Goal: Disease State Management   Start Date: 10/06/2020  Expected End Date: 10/06/2021  Recent Progress: On track  Priority: High  Note:    Current Barriers:  Unable to achieve control of osteoporosis   Pharmacist Clinical Goal(s):  Patient will achieve control of osteoporosis as evidenced by dexa scan through collaboration with PharmD and provider.   Interventions: 1:1 collaboration with Cox, Kirsten, MD regarding development and update of comprehensive plan of care as evidenced by provider attestation and co-signature Inter-disciplinary care team collaboration (see longitudinal plan of care) Comprehensive medication review performed; medication list updated in electronic medical record  Hyperlipidemia: (LDL goal < 55) -Not ideally controlled -Current treatment: atorvastatin 40 mg  daily Appropriate, Effective, Safe, Accessible Fenofibrate 160 mg daily Appropriate, Effective, Safe, Accessible -Medications previously tried: none reported  -Current dietary patterns:  barbecue chicken, red potatoes, green beans, carrots for supper the other night. Fruit. Avoids fried foods.  -Current exercise habits: limited due to pinched nerve -Educated on Cholesterol goals;  Benefits of statin for ASCVD risk reduction; Importance of limiting foods high in cholesterol; -Counseled on diet and exercise extensively Recommended to continue current medication  COPD (Goal: control symptoms and prevent exacerbations) -Controlled -Current treatment  albuterol inhaler 2 puffs every 4 hours prn - using 2-3 times weekly during allergy season Appropriate, Effective, Safe, Accessible Dymista 137-50 mcg/act 1 spray each nostril twice daily Appropriate, Effective, Safe, Accessible Montelukast 10 mg daily Appropriate, Effective, Safe, Accessible Spiriva 2.5 mcg/act 1 puff twice daily Appropriate, Effective, Safe, Accessible Cetirizine 154mQuery Appropriate,  Levocetirizine 54m9muery Appropriate,  -Medications previously tried:  Breztri, cetirizine,  Trelegy,  -Gold Grade: Gold 2 (FEV1 50-79%) -Pulmonary function testing: ratio 53%, FEV1 56%, FVC 84% -Exacerbations requiring treatment in last 6 months: none -Patient reports consistent use of maintenance inhaler -Frequency of rescue inhaler use: 2-3 times weekly during allergy season  -Counseled on Proper inhaler technique; Benefits of consistent maintenance inhaler use June 2023: Recommend patient use Cetirizine or Levocetirizine. She said she'd prefer Levo since she is out of Cetirizine. Updated medslist  Depression/Anxiety (Goal: manage symptoms of depression) -Not ideally controlled -Current treatment: buspirone 30 mg bid Appropriate, Effective, Safe, Accessible duloxetine 60 mg daily Appropriate, Effective, Safe, Accessible Clonazepam 0.5  mg bid Appropriate, Effective, Safe, Accessible -Medications previously tried/failed: paroxetine, trazodone  -PHQ9:     05/08/2021   10:39 AM 02/06/2021   10:56 AM 02/01/2021    9:53 AM  Depression screen PHQ 2/9  Decreased Interest 0 0 0  Down, Depressed, Hopeless 0 1 1  PHQ - 2 Score 0 1 1  Altered sleeping  1 1  Tired, decreased energy  1 1  Change in appetite  2 1  Feeling bad or failure about yourself   0 0  Trouble concentrating  0 0  Moving slowly or fidgety/restless  0 0  Suicidal thoughts  0 0  PHQ-9 Score  5 4  Difficult doing work/chores  Somewhat difficult Somewhat difficult   -Educated on Benefits of medication for symptom control -Recommended to continue current medication November 2022: Unable to conduct PHQ9 today, each time I started patient went on tangent about her house or son. Was unable to go over individual meds for the same reason June 2023: Unable to conduct PHQ9. Patient's BF broke up with her and unable to discuss  Tobacco use (Goal reduce smoking) -Uncontrolled -Previous quit attempts: cold [place], chantix (made her sick), patches -Current treatment  None reported -Patient smokes Within 30 minutes of waking -Patient triggers include: stress and anxiety -On a scale of 1-10, reports MOTIVATION to quit is 1 -On a scale of 1-10, reports CONFIDENCE in quitting is 1 -Provided contact information for [place] [facility]uit Line (1-800-QUIT-NOW) and encouraged patient to reach out to this group for support. -Counseled on importance of smoking cessation.   Osteoporosis / Osteopenia (Goal reduce risk of fracture ) -Managed by Peri Jefferson, Endo -Uncontrolled -Last DEXA Scan:  04/06/2020  T-Score femoral neck: -2.1   T-Score forearm radius:  - 3.9  -Patient is a candidate for pharmacologic treatment due to T-Score < -2.5 in femoral neck -Current treatment  Vitamin D 50,000 units weekly Appropriate, Effective, Safe, Accessible Tymlos (July 1st, 2022) Appropriate,  Effective, Safe, Accessible -Medications previously tried:  Actor, Alendronate (August 2018-June 2022) -Recommend [phone] units of vitamin D daily. Recommend 1200 mg of calcium daily from dietary and supplemental sources. Counseled on oral bisphosphonate administration: take in the morning, 30 minutes prior to food with 6-8 oz of water. Do not lie down for at least 30 minutes after taking. Recommend weight-bearing and muscle strengthening exercises for building and maintaining bone density.  -Counseled on diet and exercise extensively Recommended to continue current medication Counseled on calcium supplementation  November 2022: Patient unsure of Alendronate start date but will defer to specialist June 2023: Defer to specialist   Hypothyroidism (Goal: TSH 2-3) -Not ideally controlled -Current treatment  levothyroxine 75 mcg daily in the morning  Appropriate, Effective, Safe, Accessible -Medications previously tried: none reported  -Recommended considering dose adjustment for goal TSH 2-3.      Patient Goals/Self-Care Activities Patient will:  -  take medications as prescribed focus on medication adherence by using pill box  Follow Up Plan: Telephone follow up appointment with care management team member scheduled for: Jan 2024  [name], [place].D. - [phone]       Medication Assistance: None required.  Patient affirms current coverage meets needs.  Patient's preferred pharmacy is:  [employer] ([place], [place] [facility] [place] [address] [place] [address] Phone: [phone] Fax: [phone]  [id number] [place]Phone: [phone]: [phone] [facility] [place] [address]Phone: [phone]:  [phone]  Uses pill box? Yes - has four times daily dosing in pill box Pt endorses good compliance  We discussed: Current pharmacy is preferred with insurance plan and patient is satisfied with pharmacy services Patient decided to: Continue current medication management strategy  Care Plan and Follow Up Patient Decision:  Patient agrees to Care Plan and Follow-up.  Plan: Telephone follow up appointment with care management team member scheduled for:  Jan 2024  NArizona Constable PFloridaD. -- [phone]

## 2021-11-21 ENCOUNTER — Other Ambulatory Visit: Payer: Self-pay | Admitting: Family Medicine

## 2021-11-24 DIAGNOSIS — F17210 Nicotine dependence, cigarettes, uncomplicated: Secondary | ICD-10-CM | POA: Diagnosis not present

## 2021-11-24 DIAGNOSIS — E785 Hyperlipidemia, unspecified: Secondary | ICD-10-CM

## 2021-11-24 DIAGNOSIS — M8580 Other specified disorders of bone density and structure, unspecified site: Secondary | ICD-10-CM | POA: Diagnosis not present

## 2021-11-24 DIAGNOSIS — M810 Age-related osteoporosis without current pathological fracture: Secondary | ICD-10-CM

## 2021-11-24 DIAGNOSIS — J449 Chronic obstructive pulmonary disease, unspecified: Secondary | ICD-10-CM | POA: Diagnosis not present

## 2021-11-24 DIAGNOSIS — E039 Hypothyroidism, unspecified: Secondary | ICD-10-CM

## 2021-11-27 ENCOUNTER — Telehealth: Payer: Self-pay

## 2021-11-27 NOTE — Progress Notes (Signed)
Chronic Care Management Pharmacy Assistant   Name: Connie Coffey  MRN: [id number] DOB: 07-Apr-1956   Reason for Encounter: General Adherence Call   Recent office visits:  None  Recent consult visits:  None  Hospital visits:  None  Medications: Outpatient Encounter Medications as of 11/27/2021  Medication Sig   Abaloparatide (TYMLOS) 3120 MCG/1.56ML SOPN Inject 80 mcg into the skin daily.   albuterol (VENTOLIN HFA) 108 (90 Base) MCG/ACT inhaler Inhale 2 puffs into the lungs every 4 (four) hours as needed.   atorvastatin (LIPITOR) 40 MG tablet TAKE 1 TABLET BY MOUTH  DAILY   bethanechol (URECHOLINE) 25 MG tablet Take 1 tablet (25 mg total) by mouth in the morning and at bedtime.   busPIRone (BUSPAR) 30 MG tablet TAKE 1 TABLET BY MOUTH TWICE A DAY   cetirizine (ZYRTEC) 10 MG tablet Take 1 tablet (10 mg total) by mouth at bedtime. (Patient not taking: Reported on 11/15/2021)   clonazePAM (KLONOPIN) 0.5 MG tablet TAKE 1 TABLET BY MOUTH TWICE A DAY   cyclobenzaprine (FLEXERIL) 10 MG tablet TAKE 1 TABLET BY MOUTH 3  TIMES DAILY   D-Mannose 500 MG CAPS Take 500 mg by mouth in the morning and at bedtime.   diclofenac Sodium (VOLTAREN) 1 % GEL Apply 20 g topically 4 (four) times daily. (Patient taking differently: Apply 2 g topically 4 (four) times daily as needed (pain).)   doxycycline (VIBRA-TABS) 100 MG tablet Take 1 tablet (100 mg total) by mouth 2 (two) times daily.   DULoxetine (CYMBALTA) 60 MG capsule TAKE 1 CAPSULE BY MOUTH  DAILY   DYMISTA 137-50 MCG/ACT SUSP One spray each nostril twice a day.   fenofibrate 160 MG tablet Take 1 tablet (160 mg total) by mouth daily.   fluconazole (DIFLUCAN) 200 MG tablet Take 1 tablet (200 mg total) by mouth daily. Take for 2 days   gabapentin (NEURONTIN) 600 MG tablet TAKE 1 TABLET BY MOUTH 4  TIMES DAILY   guaiFENesin (MUCINEX) 600 MG 12 hr tablet Take 600 mg by mouth 2 (two) times daily as needed for cough or to loosen phlegm.   hydrocortisone  (CORTEF) 10 MG tablet Take 10 mg by mouth 2 (two) times daily.   Insulin Pen Needle (GLOBAL EASY GLIDE PEN NEEDLES) 32G X 4 MM MISC Use one needle daily as directed   levocetirizine (XYZAL) 5 MG tablet Take 5 mg by mouth every evening.   levothyroxine (SYNTHROID) 75 MCG tablet TAKE 1 TABLET BY MOUTH EVERY DAY BEFORE BREAKFAST   lubiprostone (AMITIZA) 8 MCG capsule TAKE 1 CAPSULE BY MOUTH TWICE  DAILY WITH MEALS   montelukast (SINGULAIR) 10 MG tablet TAKE 1 TABLET BY MOUTH  DAILY   Multiple Vitamin (MULTI-VITAMINS) TABS Take 1 tablet by mouth daily.   nitrofurantoin, macrocrystal-monohydrate, (MACROBID) 100 MG capsule Take 100 mg by mouth daily.   nystatin (MYCOSTATIN) 100000 UNIT/ML suspension Take 5 mLs (500,000 Units total) by mouth 4 (four) times daily.   olopatadine (PATANOL) 0.1 % ophthalmic solution Place 1 drop into both eyes daily as needed for allergies.   omeprazole (PRILOSEC) 40 MG capsule Take 1 capsule (40 mg total) by mouth 2 (two) times daily.   oxyCODONE-acetaminophen (PERCOCET) 10-325 MG tablet TAKE 1 TAB EVERY 4 HOURS FOR 5 DAYS THEN 1 TAB TWICE A DAY AS NEEDED FOR SEVERE BREAKTHROUGH PAIN   primidone (MYSOLINE) 50 MG tablet TAKE 1 TABLET BY MOUTH TWICE A DAY   Propylene Glycol (SYSTANE BALANCE) 0.6 % SOLN Place  1 drop into both eyes daily as needed (dry eyes).   SPIRIVA RESPIMAT 2.5 MCG/ACT AERS USE 1 INHALATION BY MOUTH  TWICE DAILY   Vitamin D, Ergocalciferol, (DRISDOL) 50000 units CAPS capsule Take 50,000 Units by mouth every Wednesday.   No facility-administered encounter medications on file as of 11/27/2021.    [facility] for General Review Call   Chart Review:  Have there been any documented new, changed, or discontinued medications since last visit? No Has there been any documented recent hospitalizations or ED visits since last visit with Clinical Pharmacist? No Brief Summary (including medication and/or Diagnosis changes):   Adherence  Review:  Does the Clinical Pharmacist Assistant have access to adherence rates? Yes Adherence rates for STAR metric medications (List medication(s)/day supply/ last 2 fill dates). Atorvastatin 08/29/21 100ds 05/31/21 90ds Adherence rates for medications indicated for disease state being reviewed (List medication(s)/day supply/ last 2 fill dates). Does the patient have >5 day gap between last estimated fill dates for any of the above medications or other medication gaps? No Reason for medication gaps.   Disease State Questions:  Able to connect with Patient? Yes Did patient have any problems with their health recently? No Note problems and Concerns: Have you had any admissions or emergency room visits or worsening of your condition(s) since last visit? No Details of ED visit, hospital visit and/or worsening condition(s): Have you had any visits with new specialists or providers since your last visit? No Explain: Have you had any new health care problem(s) since your last visit? No New problem(s) reported: Have you run out of any of your medications since you last spoke with clinical pharmacist? No What caused you to run out of your medications? Are there any medications you are not taking as prescribed? No What kept you from taking your medications as prescribed? Are you having any issues or side effects with your medications? No Note of issues or side effects: Do you have any other health concerns or questions you want to discuss with your Clinical Pharmacist before your next visit? No Note additional concerns and questions from Patient. Are there any health concerns that you feel we can do a better job addressing? No Note Patient's response. Are you having any problems with any of the following since the last visit: (select all that apply)  Sleeping  Details:Pt wakes up in the middle of the night due to her hips hurting, She has to toss and turn to get comfortable 12. Any falls since  last visit? No  Details: 13. Any increased or uncontrolled pain since last visit? No  Details: 14. Next visit Type: office       Visit with:Dr. Cox        Date:12/08/21        Time:9:20am  15. Additional Details? Yes,pt has been depressed. Pt and her boyfriend just broke up and she has been in pain since her back surgery and has been able to get around a lot. She wants to get out of the house but with the rain she has been cooped up. Pt stated she does not have a lot of help to help her with yard work and so everything is overgrown. She has a son but he doesn't do a lot to help out. We just talked and she vented but overall seems to be doing ok. She gets up and does some housework but is limited due to her back. She will follow up with physician next month  [name], [facility] Pharmacist Assistant  [phone]

## 2021-12-01 ENCOUNTER — Other Ambulatory Visit: Payer: Self-pay

## 2021-12-01 NOTE — Telephone Encounter (Signed)
She has been taking both cetirizine and levocetirizine. She is unsure if she should be taking both.

## 2021-12-05 ENCOUNTER — Other Ambulatory Visit: Payer: Self-pay | Admitting: Legal Medicine

## 2021-12-06 ENCOUNTER — Other Ambulatory Visit: Payer: Self-pay

## 2021-12-06 MED ORDER — ATORVASTATIN CALCIUM 40 MG PO TABS: 40.0000 mg | ORAL_TABLET | Freq: Every day | ORAL | 3 refills | Status: DC

## 2021-12-08 ENCOUNTER — Encounter: Payer: Self-pay | Admitting: Family Medicine

## 2021-12-08 ENCOUNTER — Ambulatory Visit (INDEPENDENT_AMBULATORY_CARE_PROVIDER_SITE_OTHER): Payer: Medicare Other | Admitting: Family Medicine

## 2021-12-08 VITALS — BP 122/72 | HR 84 | Temp 98.3°F | Ht 63.0 in | Wt 137.0 lb

## 2021-12-08 DIAGNOSIS — K5903 Drug induced constipation: Secondary | ICD-10-CM | POA: Diagnosis not present

## 2021-12-08 DIAGNOSIS — K219 Gastro-esophageal reflux disease without esophagitis: Secondary | ICD-10-CM

## 2021-12-08 DIAGNOSIS — F17218 Nicotine dependence, cigarettes, with other nicotine-induced disorders: Secondary | ICD-10-CM

## 2021-12-08 DIAGNOSIS — M818 Other osteoporosis without current pathological fracture: Secondary | ICD-10-CM

## 2021-12-08 DIAGNOSIS — J410 Simple chronic bronchitis: Secondary | ICD-10-CM

## 2021-12-08 DIAGNOSIS — E782 Mixed hyperlipidemia: Secondary | ICD-10-CM

## 2021-12-08 DIAGNOSIS — E559 Vitamin D deficiency, unspecified: Secondary | ICD-10-CM

## 2021-12-08 DIAGNOSIS — T402X5A Adverse effect of other opioids, initial encounter: Secondary | ICD-10-CM

## 2021-12-08 DIAGNOSIS — E271 Primary adrenocortical insufficiency: Secondary | ICD-10-CM

## 2021-12-08 DIAGNOSIS — E039 Hypothyroidism, unspecified: Secondary | ICD-10-CM | POA: Diagnosis not present

## 2021-12-08 DIAGNOSIS — M810 Age-related osteoporosis without current pathological fracture: Secondary | ICD-10-CM

## 2021-12-08 DIAGNOSIS — T380X5A Adverse effect of glucocorticoids and synthetic analogues, initial encounter: Secondary | ICD-10-CM

## 2021-12-08 DIAGNOSIS — D75839 Thrombocytosis, unspecified: Secondary | ICD-10-CM

## 2021-12-08 DIAGNOSIS — F330 Major depressive disorder, recurrent, mild: Secondary | ICD-10-CM

## 2021-12-08 DIAGNOSIS — F1120 Opioid dependence, uncomplicated: Secondary | ICD-10-CM

## 2021-12-08 MED ORDER — DYMISTA 137-50 MCG/ACT NA SUSP
NASAL | 1 refills | Status: DC
Start: 2021-12-08 — End: 2022-01-23

## 2021-12-08 MED ORDER — ONDANSETRON HCL 4 MG PO TABS: 4.0000 mg | ORAL_TABLET | Freq: Three times a day (TID) | ORAL | 0 refills | Status: DC | PRN

## 2021-12-08 NOTE — Progress Notes (Signed)
Subjective:  Patient ID: Connie Coffey, female    DOB: 10-12-1955  Age: 66 y.o. MRN: [id number]  Chief Complaint  Patient presents with   Hyperlipidemia   Hypothyroidism   HPI: Chronic back pain: spine stimulator  helps.  Sees Dr. Lorrine Kin. Pain clinic: on percocet 10/325 mg four times a day as needed, gabapentin 600 mg four times a day, cyclobenzaprine 10 mg three times a day. Medications: unchanged.  Hyperlipidemia: on lipitor 40 mg daily and fenofibrate 160 mg daily. Not eating healthy or exercise.   Depression with anxiety: Worsened due to stress from her son. Just broke up with boyfriend. On buspirone 30 mg twice daily, duloxetine 60 mg once daily, and buspirone 30 mg twice daily, and clonazepam 0.5 mg twice daily.      12/08/2021    9:29 AM 05/08/2021   10:39 AM 02/06/2021   10:56 AM  PHQ9 SCORE ONLY  PHQ-9 Total Score 5 0 5   Vitamin D deficiency: Vitamin D 50K weekly.   Endocrinology manages hypothyroidism, adrenal insufficiency, and osteoporosis.   Hypothyroidism: on synthroid 75 mcg once daily in am.  Adrenal insufficiency: on cortef 20 mg in am and 10 mg in pm.    GERD: on omeprazole 40 mg one twice daily. Well controlled.  Chronic UTIs: takes macrobid 100 mg once daily. Patient sees Lasandra Beech, NP.  COPD: on singulair 10 mg before bed, spiriva respimat 2.5 mg one inhalation twice daily.  Tobacco use: patient has refused to quit. Smoking 1 ppd.    Current Outpatient Medications on File Prior to Visit  Medication Sig Dispense Refill   Abaloparatide (TYMLOS) 3120 MCG/1.56ML SOPN Inject 80 mcg into the skin daily. 4.68 mL 1   albuterol (VENTOLIN HFA) 108 (90 Base) MCG/ACT inhaler Inhale 2 puffs into the lungs every 4 (four) hours as needed. 18 g 2   atorvastatin (LIPITOR) 40 MG tablet Take 1 tablet (40 mg total) by mouth daily. 90 tablet 3   bethanechol (URECHOLINE) 25 MG tablet Take 1 tablet (25 mg total) by mouth in the morning and at bedtime. 180 tablet 1    busPIRone (BUSPAR) 30 MG tablet TAKE 1 TABLET BY MOUTH TWICE A DAY 180 tablet 1   cetirizine (ZYRTEC) 10 MG tablet Take 1 tablet (10 mg total) by mouth at bedtime. 90 tablet 2   clonazePAM (KLONOPIN) 0.5 MG tablet TAKE 1 TABLET BY MOUTH TWICE A DAY 60 tablet 2   cyclobenzaprine (FLEXERIL) 10 MG tablet TAKE 1 TABLET BY MOUTH 3  TIMES DAILY 270 tablet 1   D-Mannose 500 MG CAPS Take 500 mg by mouth in the morning and at bedtime.     diclofenac Sodium (VOLTAREN) 1 % GEL Apply 20 g topically 4 (four) times daily. (Patient taking differently: Apply 2 g topically 4 (four) times daily as needed (pain).) 20 g 0   DULoxetine (CYMBALTA) 60 MG capsule TAKE 1 CAPSULE BY MOUTH  DAILY 90 capsule 3   fenofibrate 160 MG tablet Take 1 tablet (160 mg total) by mouth daily. 90 tablet 1   gabapentin (NEURONTIN) 600 MG tablet TAKE 1 TABLET BY MOUTH 4  TIMES DAILY 360 tablet 3   guaiFENesin (MUCINEX) 600 MG 12 hr tablet Take 600 mg by mouth 2 (two) times daily as needed for cough or to loosen phlegm.     hydrocortisone (CORTEF) 10 MG tablet Take 10 mg by mouth 2 (two) times daily.     Insulin Pen Needle (GLOBAL EASY GLIDE PEN NEEDLES)  32G X 4 MM MISC Use one needle daily as directed     levocetirizine (XYZAL) 5 MG tablet Take 5 mg by mouth every evening.     levothyroxine (SYNTHROID) 75 MCG tablet TAKE 1 TABLET BY MOUTH EVERY DAY BEFORE BREAKFAST 90 tablet 0   lubiprostone (AMITIZA) 8 MCG capsule TAKE 1 CAPSULE BY MOUTH TWICE  DAILY WITH MEALS 180 capsule 3   montelukast (SINGULAIR) 10 MG tablet TAKE 1 TABLET BY MOUTH  DAILY 90 tablet 3   Multiple Vitamin (MULTI-VITAMINS) TABS Take 1 tablet by mouth daily.     nystatin (MYCOSTATIN) 100000 UNIT/ML suspension Take 5 mLs (500,000 Units total) by mouth 4 (four) times daily. 60 mL 2   olopatadine (PATANOL) 0.1 % ophthalmic solution Place 1 drop into both eyes daily as needed for allergies.     omeprazole (PRILOSEC) 40 MG capsule TAKE 1 CAPSULE BY MOUTH  TWICE DAILY 180  capsule 3   oxyCODONE-acetaminophen (PERCOCET) 10-325 MG tablet TAKE 1 TAB EVERY 4 HOURS FOR 5 DAYS THEN 1 TAB TWICE A DAY AS NEEDED FOR SEVERE BREAKTHROUGH PAIN  0   primidone (MYSOLINE) 50 MG tablet TAKE 1 TABLET BY MOUTH TWICE A DAY 180 tablet 1   Propylene Glycol (SYSTANE BALANCE) 0.6 % SOLN Place 1 drop into both eyes daily as needed (dry eyes).     SPIRIVA RESPIMAT 2.5 MCG/ACT AERS USE 1 INHALATION BY MOUTH  TWICE DAILY 12 g 3   Vitamin D, Ergocalciferol, (DRISDOL) 50000 units CAPS capsule Take 50,000 Units by mouth every Wednesday.     No current facility-administered medications on file prior to visit.   Past Medical History:  Diagnosis Date   Adrenal insufficiency (HCC)    Anxiety    Arthritis    Carpal tunnel syndrome    Both hands, worse in right   Chronic constipation    COPD (chronic obstructive pulmonary disease) (HCC)    CVA (cerebral vascular accident) (HCC)    Depression    Facet syndrome    Fibromyalgia    GERD (gastroesophageal reflux disease)    Heart murmur    Hypercholesteremia    IBS (irritable bowel syndrome)    Ischemic stroke (HCC)    07/19/2004   Lumbar radiculopathy    Osteoporosis    Radiculopathy, lumbar region 05/02/2021   RLS (restless legs syndrome)    Spinal stenosis    TMJ (temporomandibular joint disorder)    Trochanteric bursitis of both hips    Vascular malformation    spinal cord   Past Surgical History:  Procedure Laterality Date   APPENDECTOMY     CARPAL TUNNEL RELEASE Right 12/13/2017   surgery   CHOLECYSTECTOMY     COLONOSCOPY  05/06/2017   Colonic polyps status post polypectomy. Interan land external hemorrhoids.    ESOPHAGOGASTRODUODENOSCOPY  04/28/2007   Mild gastrtiis. Otherwise normal EGD.    HEMORROIDECTOMY     LAMINECTOMY AND MICRODISCECTOMY LUMBAR SPINE  01/03/2005   NECK SURGERY     x2    OTHER SURGICAL HISTORY  04/24/2004   anterior cervical discectomy and fusion at C5-C6   SPINAL CORD STIMULATOR INSERTION N/A  07/21/2021   Procedure: Permanant Spinal cord stimulator placement leads and battery under fluroscopy;  Surgeon: Renaldo Fiddler, MD;  Location: [facility] OR;  Service: Neurosurgery;  Laterality: N/A;   TONSILLECTOMY     TOTAL ABDOMINAL HYSTERECTOMY      Family History  Problem Relation Age of Onset   Supraventricular tachycardia Mother  Dementia Mother    Hyperlipidemia Mother    Hypertension Mother    Osteoarthritis Mother    Osteoporosis Mother    Breast cancer Other    Breast cancer Other    Colon cancer Neg Hx    Esophageal cancer Neg Hx    Social History   Socioeconomic History   Marital status: Divorced    Spouse name: Not on file   Number of children: 1   Years of education: Not on file   Highest education level: Not on file  Occupational History   Occupation: Disabled  Tobacco Use   Smoking status: Every Day    Packs/day: 1.00    Types: Cigarettes   Smokeless tobacco: Never  Vaping Use   Vaping Use: Never used  Substance and Sexual Activity   Alcohol use: Not Currently    Comment: quit 19 years   Drug use: Never   Sexual activity: Not Currently  Other Topics Concern   Not on file  Social History Narrative   Disabled son lives with her sometimes and his father sometimes.  Mili is working hard to have a relationship with her son's father and his wife.  They help her when needed.   Social Determinants of Health   Financial Resource Strain: Low Risk  (11/15/2021)   Overall Financial Resource Strain (CARDIA)    Difficulty of Paying Living Expenses: Not hard at all  Food Insecurity: No Food Insecurity (06/30/2019)   Hunger Vital Sign    Worried About Running Out of Food in the Last Year: Never true    Ran Out of Food in the Last Year: Never true  Transportation Needs: Unmet Transportation Needs (04/12/2021)   PRAPARE - Administrator, Civil Service (Medical): Yes    Lack of Transportation (Non-Medical): Yes  Physical Activity: Inactive (11/15/2021)    Exercise Vital Sign    Days of Exercise per Week: 0 days    Minutes of Exercise per Session: 0 min  Stress: Stress Concern Present (06/30/2019)   [employer] of Occupational Health - Occupational Stress Questionnaire    Feeling of Stress : Very much  Social Connections: Not on file    Review of Systems  Constitutional:  Negative for appetite change, fatigue and fever.  HENT:  Negative for congestion, ear pain, sinus pressure and sore throat.   Respiratory:  Negative for cough, chest tightness, shortness of breath and wheezing.   Cardiovascular:  Negative for chest pain and palpitations.  Gastrointestinal:  Negative for abdominal pain, constipation, diarrhea, nausea and vomiting.  Genitourinary:  Negative for dysuria and hematuria.  Musculoskeletal:  Negative for arthralgias, back pain, joint swelling and myalgias.  Skin:  Negative for rash.  Neurological:  Negative for dizziness, weakness and headaches.  Psychiatric/Behavioral:  Negative for dysphoric mood and suicidal ideas. The patient is nervous/anxious.      Objective:  BP 122/72 (BP Location: Left Arm, Patient Position: Sitting)   Pulse 84   Temp 98.3 F (36.8 C) (Oral)   Ht 5\' 3"  (1.6 m)   Wt 137 lb (62.1 kg)   SpO2 91%   BMI 24.27 kg/m      12/08/2021    9:31 AM 11/03/2021    1:42 PM 09/21/2021    2:45 PM  BP/Weight  Systolic BP 122 139 136  Diastolic BP 72 67 76  Wt. (Lbs) 137 138.7 139  BMI 24.27 kg/m2 24.57 kg/m2 24.62 kg/m2    Physical Exam Vitals reviewed.  Constitutional:  Appearance: Normal appearance. She is normal weight.  Cardiovascular:     Rate and Rhythm: Normal rate and regular rhythm.     Heart sounds: Normal heart sounds.  Pulmonary:     Effort: Pulmonary effort is normal.     Breath sounds: Normal breath sounds.  Abdominal:     General: Abdomen is flat. Bowel sounds are normal.     Palpations: Abdomen is soft.     Tenderness: There is no abdominal tenderness.  Neurological:      Mental Status: She is alert and oriented to person, place, and time.  Psychiatric:        Mood and Affect: Mood normal.        Behavior: Behavior normal.    Diabetic Foot Exam - Simple   No data filed      Lab Results  Component Value Date   WBC 9.9 12/08/2021   HGB 13.4 12/08/2021   HCT 41.2 12/08/2021   PLT 453 (H) 12/08/2021   GLUCOSE 90 12/08/2021   CHOL 165 12/08/2021   TRIG 124 12/08/2021   HDL 77 12/08/2021   LDLCALC 67 12/08/2021   ALT 11 12/08/2021   AST 16 12/08/2021   NA 143 12/08/2021   K 4.5 12/08/2021   CL 103 12/08/2021   CREATININE 0.80 12/08/2021   BUN 11 12/08/2021   CO2 24 12/08/2021   TSH 4.790 (H) 08/07/2021      Assessment & Plan:   Problem List Items Addressed This Visit       Respiratory   Simple chronic bronchitis (HCC)    Continue spiriva.  Continue albuterol.        Digestive   Therapeutic opioid induced constipation    Continue amitiza 8 mcg one twice daily.       GERD (gastroesophageal reflux disease)    The current medical regimen is effective;  continue present plan and medications. Continue omeprazole 40 mg twice daily.       Relevant Medications   ondansetron (ZOFRAN) 4 MG tablet     Endocrine   Acquired hypothyroidism - Primary    Previously well controlled Continue Synthroid at current dose  Recheck TSH and adjust Synthroid as indicated       Adrenal insufficiency (Addison's disease) (HCC)    Continue cortef 10 mg one twice daily.         Musculoskeletal and Integument   Steroid-induced osteoporosis    The current medical regimen is effective;  continue present plan and medications. MANAGEMENT per endocrinology.         Hematopoietic and Hemostatic   Thrombocythemia    Check cbc.         Other   Depression, major, recurrent, mild (HCC)    The current medical regimen is effective;  continue present plan and medications. Continue buspirone 30 mg twice daily, duloxetine 60 mg once daily, and  buspirone 30 mg twice daily, and clonazepam 0.5 mg twice daily.       Vitamin D deficiency    The current medical regimen is effective;  continue present plan and medications. Continue vitamin D 50 K once weekly.       Mixed hyperlipidemia    Well controlled.  No changes to medicines. Continue  lipitor 40 mg daily and fenofibrate 160 mg daily.  Continue to work on eating a healthy diet and exercise.  Labs drawn today.        Relevant Orders   CBC with Differential/Platelet (Completed)   Comprehensive metabolic  panel (Completed)   Lipid panel (Completed)   Uncomplicated opioid dependence (HCC)    Managed by pain clinic.       Nicotine dependence    Recommended cessation.      Other Visit Diagnoses     Age-related osteoporosis without current pathological fracture         .  Meds ordered this encounter  Medications   DYMISTA 137-50 MCG/ACT SUSP    Sig: One spray each nostril twice a day.    Dispense:  69 g    Refill:  1   ondansetron (ZOFRAN) 4 MG tablet    Sig: Take 1 tablet (4 mg total) by mouth every 8 (eight) hours as needed for nausea or vomiting.    Dispense:  40 tablet    Refill:  0    Orders Placed This Encounter  Procedures   CBC with Differential/Platelet   Comprehensive metabolic panel   Lipid panel     Follow-up: Return in about 3 months (around 03/10/2022) for chronic fasting.  An After Visit Summary was printed and given to the patient.  I,Lauren M Auman,acting as a scribe for Blane Ohara, MD.,have documented all relevant documentation on the behalf of Blane Ohara, MD,as directed by  Blane Ohara, MD while in the presence of Blane Ohara, MD.    Blane Ohara, MD [facility] [phone]

## 2021-12-09 LAB — COMPREHENSIVE METABOLIC PANEL
ALT: 11 IU/L (ref 0–32)
AST: 16 IU/L (ref 0–40)
Albumin/Globulin Ratio: 2.4 — ABNORMAL HIGH (ref 1.2–2.2)
Albumin: 4.6 g/dL (ref 3.9–4.9)
Alkaline Phosphatase: 47 IU/L (ref 44–121)
BUN/Creatinine Ratio: 14 (ref 12–28)
BUN: 11 mg/dL (ref 8–27)
Bilirubin Total: 0.2 mg/dL (ref 0.0–1.2)
CO2: 24 mmol/L (ref 20–29)
Calcium: 9.7 mg/dL (ref 8.7–10.3)
Chloride: 103 mmol/L (ref 96–106)
Creatinine, Ser: 0.8 mg/dL (ref 0.57–1.00)
Globulin, Total: 1.9 g/dL (ref 1.5–4.5)
Glucose: 90 mg/dL (ref 70–99)
Potassium: 4.5 mmol/L (ref 3.5–5.2)
Sodium: 143 mmol/L (ref 134–144)
Total Protein: 6.5 g/dL (ref 6.0–8.5)
eGFR: 81 mL/min/{1.73_m2} (ref >59)

## 2021-12-09 LAB — CBC WITH DIFFERENTIAL/PLATELET
Basophils Absolute: 0.1 10*3/uL (ref 0.0–0.2)
Basos: 1 %
EOS (ABSOLUTE): 0.1 10*3/uL (ref 0.0–0.4)
Eos: 1 %
Hematocrit: 41.2 % (ref 34.0–46.6)
Hemoglobin: 13.4 g/dL (ref 11.1–15.9)
Immature Grans (Abs): 0 10*3/uL (ref 0.0–0.1)
Immature Granulocytes: 0 %
Lymphocytes Absolute: 1.7 10*3/uL (ref 0.7–3.1)
Lymphs: 17 %
MCH: 28.2 pg (ref 26.6–33.0)
MCHC: 32.5 g/dL (ref 31.5–35.7)
MCV: 87 fL (ref 79–97)
Monocytes Absolute: 0.5 10*3/uL (ref 0.1–0.9)
Monocytes: 5 %
Neutrophils Absolute: 7.5 10*3/uL — ABNORMAL HIGH (ref 1.4–7.0)
Neutrophils: 76 %
Platelets: 453 10*3/uL — ABNORMAL HIGH (ref 150–450)
RBC: 4.75 x10E6/uL (ref 3.77–5.28)
RDW: 13.3 % (ref 11.7–15.4)
WBC: 9.9 10*3/uL (ref 3.4–10.8)

## 2021-12-09 LAB — LIPID PANEL
Chol/HDL Ratio: 2.1 ratio (ref 0.0–4.4)
Cholesterol, Total: 165 mg/dL (ref 100–199)
HDL: 77 mg/dL (ref >39)
LDL Chol Calc (NIH): 67 mg/dL (ref 0–99)
Triglycerides: 124 mg/dL (ref 0–149)
VLDL Cholesterol Cal: 21 mg/dL (ref 5–40)

## 2021-12-11 NOTE — Assessment & Plan Note (Signed)
The current medical regimen is effective;  continue present plan and medications. Continue buspirone 30 mg twice daily, duloxetine 60 mg once daily, and buspirone 30 mg twice daily, and clonazepam 0.5 mg twice daily.

## 2021-12-11 NOTE — Assessment & Plan Note (Signed)
The current medical regimen is effective;  continue present plan and medications. Continue omeprazole 40 mg twice daily.  

## 2021-12-11 NOTE — Assessment & Plan Note (Signed)
The current medical regimen is effective;  continue present plan and medications. Continue vitamin D 50 K once weekly.

## 2021-12-11 NOTE — Assessment & Plan Note (Signed)
Managed by pain clinic 

## 2021-12-11 NOTE — Assessment & Plan Note (Signed)
Continue amitiza 8 mcg one twice daily.

## 2021-12-11 NOTE — Assessment & Plan Note (Signed)
The current medical regimen is effective;  continue present plan and medications. MANAGEMENT per endocrinology.

## 2021-12-11 NOTE — Assessment & Plan Note (Signed)
Continue spiriva.  Continue albuterol.

## 2021-12-11 NOTE — Assessment & Plan Note (Signed)
Recommended cessation.  

## 2021-12-11 NOTE — Assessment & Plan Note (Signed)
Continue cortef 10 mg one twice daily.

## 2021-12-11 NOTE — Assessment & Plan Note (Signed)
Previously well controlled Continue Synthroid at current dose  Recheck TSH and adjust Synthroid as indicated   

## 2021-12-11 NOTE — Assessment & Plan Note (Signed)
Well controlled.  No changes to medicines. Continue  lipitor 40 mg daily and fenofibrate 160 mg daily.  Continue to work on eating a healthy diet and exercise.  Labs drawn today.

## 2021-12-11 NOTE — Assessment & Plan Note (Signed)
Check cbc 

## 2021-12-27 ENCOUNTER — Telehealth: Payer: Self-pay

## 2021-12-27 NOTE — Telephone Encounter (Signed)
Patient left VM, dymista nasal spray has been on back order at several pharmacies. Patient is requesting alternative or further advisement.  Connie Coffey, [place] 12/27/21 5:03 PM

## 2021-12-28 ENCOUNTER — Telehealth: Payer: Self-pay

## 2021-12-28 ENCOUNTER — Other Ambulatory Visit: Payer: Self-pay | Admitting: Family Medicine

## 2021-12-28 MED ORDER — AZELASTINE HCL 137 MCG/SPRAY NA SOLN: 2.0000 | Freq: Two times a day (BID) | NASAL | 11 refills | Status: DC

## 2021-12-28 MED ORDER — FLUTICASONE PROPIONATE 50 MCG/ACT NA SUSP: 2.0000 | Freq: Every day | NASAL | 11 refills | Status: DC

## 2021-12-28 NOTE — Progress Notes (Signed)
Chronic Care Management Pharmacy Assistant   Name: Connie Coffey  MRN: [id number] DOB: [birth year]   Reason for Encounter: General Adherence Call    Recent office visits:  12/28/21 [name] MD. Orders Only. Orders Flonase Nasal Spray and Azelastine Nasal Spray.   12/08/21 [name] MD. Seen for routine visit. Started on Ondansetron HCI '4mg'$ .   Recent consult visits:  None  Hospital visits:  None  Medications: Outpatient Encounter Medications as of 12/28/2021  Medication Sig   Abaloparatide (TYMLOS) 3120 MCG/1.56ML SOPN Inject 80 mcg into the skin daily.   albuterol (VENTOLIN HFA) 108 (90 Base) MCG/ACT inhaler Inhale 2 puffs into the lungs every 4 (four) hours as needed.   atorvastatin (LIPITOR) 40 MG tablet Take 1 tablet (40 mg total) by mouth daily.   Azelastine HCl 137 MCG/SPRAY SOLN Place 2 sprays into the nose in the morning and at bedtime.   bethanechol (URECHOLINE) 25 MG tablet Take 1 tablet (25 mg total) by mouth in the morning and at bedtime.   busPIRone (BUSPAR) 30 MG tablet TAKE 1 TABLET BY MOUTH TWICE A DAY   cetirizine (ZYRTEC) 10 MG tablet Take 1 tablet (10 mg total) by mouth at bedtime.   clonazePAM (KLONOPIN) 0.5 MG tablet TAKE 1 TABLET BY MOUTH TWICE A DAY   cyclobenzaprine (FLEXERIL) 10 MG tablet TAKE 1 TABLET BY MOUTH 3  TIMES DAILY   D-Mannose 500 MG CAPS Take 500 mg by mouth in the morning and at bedtime.   diclofenac Sodium (VOLTAREN) 1 % GEL Apply 20 g topically 4 (four) times daily. (Patient taking differently: Apply 2 g topically 4 (four) times daily as needed (pain).)   DULoxetine (CYMBALTA) 60 MG capsule TAKE 1 CAPSULE BY MOUTH  DAILY   DYMISTA 137-50 MCG/ACT SUSP One spray each nostril twice a day.   fenofibrate 160 MG tablet Take 1 tablet (160 mg total) by mouth daily.   fluticasone (FLONASE) 50 MCG/ACT nasal spray Place 2 sprays into both nostrils daily.   gabapentin (NEURONTIN) 600 MG tablet TAKE 1 TABLET BY MOUTH 4  TIMES DAILY   guaiFENesin  (MUCINEX) 600 MG 12 hr tablet Take 600 mg by mouth 2 (two) times daily as needed for cough or to loosen phlegm.   hydrocortisone (CORTEF) 10 MG tablet Take 10 mg by mouth 2 (two) times daily.   Insulin Pen Needle (GLOBAL EASY GLIDE PEN NEEDLES) 32G X 4 MM MISC Use one needle daily as directed   levocetirizine (XYZAL) 5 MG tablet Take 5 mg by mouth every evening.   levothyroxine (SYNTHROID) 75 MCG tablet TAKE 1 TABLET BY MOUTH EVERY DAY BEFORE BREAKFAST   lubiprostone (AMITIZA) 8 MCG capsule TAKE 1 CAPSULE BY MOUTH TWICE  DAILY WITH MEALS   montelukast (SINGULAIR) 10 MG tablet TAKE 1 TABLET BY MOUTH  DAILY   Multiple Vitamin (MULTI-VITAMINS) TABS Take 1 tablet by mouth daily.   nystatin (MYCOSTATIN) 100000 UNIT/ML suspension Take 5 mLs (500,000 Units total) by mouth 4 (four) times daily.   olopatadine (PATANOL) 0.1 % ophthalmic solution Place 1 drop into both eyes daily as needed for allergies.   omeprazole (PRILOSEC) 40 MG capsule TAKE 1 CAPSULE BY MOUTH  TWICE DAILY   ondansetron (ZOFRAN) 4 MG tablet Take 1 tablet (4 mg total) by mouth every 8 (eight) hours as needed for nausea or vomiting.   oxyCODONE-acetaminophen (PERCOCET) 10-325 MG tablet TAKE 1 TAB EVERY 4 HOURS FOR 5 DAYS THEN 1 TAB TWICE A DAY AS NEEDED FOR  SEVERE BREAKTHROUGH PAIN   primidone (MYSOLINE) 50 MG tablet TAKE 1 TABLET BY MOUTH TWICE A DAY   Propylene Glycol (SYSTANE BALANCE) 0.6 % SOLN Place 1 drop into both eyes daily as needed (dry eyes).   SPIRIVA RESPIMAT 2.5 MCG/ACT AERS USE 1 INHALATION BY MOUTH  TWICE DAILY   Vitamin D, Ergocalciferol, (DRISDOL) 50000 units CAPS capsule Take 50,000 Units by mouth every Wednesday.   No facility-administered encounter medications on file as of 12/28/2021.    [facility] for General Review Call   Chart Review:  Have there been any documented new, changed, or discontinued medications since last visit? Yes Has there been any documented recent hospitalizations or ED visits  since last visit with Clinical Pharmacist? No Brief Summary (including medication and/or Diagnosis changes):   Adherence Review:  Does the Clinical Pharmacist Assistant have access to adherence rates? Yes Adherence rates for STAR metric medications (List medication(s)/day supply/ last 2 fill /dates). Atorvastatin 12/06/21 - 08/29/21 100ds  Adherence rates for medications indicated for /disease state being reviewed (List medication(s)/day supply/ last 2 fill dates). Does the patient have >5 day gap between last estimated fill dates for any of the above medications or other medication gaps? No Reason for medication gaps.None   Disease State Questions:  Able to connect with Patient? Yes Did patient have any problems with their health recently? Yes Note problems and Concerns:Pt stated she has been experiencing diarrhea and shaking. She is started to feel better today when I called her.She has been exposed to Covid recently.  Have you had any admissions or emergency room visits or worsening of your condition(s) since last visit? No Details of ED visit, hospital visit and/or worsening condition(s): Have you had any visits with new specialists or providers since your last visit? No Explain: Have you had any new health care problem(s) since your last visit? No New problem(s) reported: Have you run out of any of your medications since you last spoke with clinical pharmacist? No What caused you to run out of your medications? Are there any medications you are not taking as prescribed? No What kept you from taking your medications as prescribed? Are you having any issues or side effects with your medications? No Note of issues or side effects: Do you have any other health concerns or questions you want to discuss with your Clinical Pharmacist before your next visit? No Note additional concerns and questions from Patient. Are there any health concerns that you feel we can do a better job addressing?  No Note Patient's response. Are you having any problems with any of the following since the last visit: (select all that apply)  Paying bills  Details:She stated she loaned some money to a friend that is refusing to pay her back and she has her home taxes due soon and stated she needed that money back that she loaned to her friend. She has been stressed about this recently.  12. Any falls since last visit? No  Details: 13. Any increased or uncontrolled pain since last visit? Yes  Details:Pt went to the pain clinic and stated she is having bad pain through her neck down to her back. She has issues laying down and sitting up. She stated she is on some much medicine that they would not give her anymore. I sent her the link to "find help" to help her with any financial assistant she may need. Pt appreciated it and will have someone to help her look through it  and apply for any assistance she needs. 14. Next visit Type: telephone       Visit with:Kimberly Tamala Julian, LPN        TGGY:6/94/85        Time:2:00pm  15. Additional Details? No    [name], [employer] [facility] [employer] manager  [phone]

## 2021-12-28 NOTE — Telephone Encounter (Addendum)
Spoke with patient, she would like these to go to [facility] on [address]. These were sent.

## 2022-01-12 ENCOUNTER — Other Ambulatory Visit: Payer: Self-pay | Admitting: Physician Assistant

## 2022-01-12 DIAGNOSIS — E782 Mixed hyperlipidemia: Secondary | ICD-10-CM

## 2022-01-19 ENCOUNTER — Telehealth: Payer: Self-pay

## 2022-01-19 NOTE — Telephone Encounter (Signed)
Patient called and stated that her pain doctor wanted her to let you know that her clonazepam needs to be change due to the pain medications that she is on. Please advise.

## 2022-01-22 ENCOUNTER — Other Ambulatory Visit: Payer: Self-pay

## 2022-01-22 NOTE — Telephone Encounter (Signed)
Continue with Buspar 30 mg twice a day and patient needs to look for counselor in the area. I did not send buspar 5 mg TID.

## 2022-01-22 NOTE — Telephone Encounter (Signed)
I left message on voicemail to call us back. 

## 2022-01-23 ENCOUNTER — Other Ambulatory Visit: Payer: Self-pay | Admitting: Family Medicine

## 2022-01-23 ENCOUNTER — Ambulatory Visit (INDEPENDENT_AMBULATORY_CARE_PROVIDER_SITE_OTHER): Payer: 59 | Admitting: Family Medicine

## 2022-01-23 VITALS — BP 120/68 | HR 100 | Temp 97.3°F | Resp 16 | Ht 64.0 in | Wt 139.0 lb

## 2022-01-23 DIAGNOSIS — T380X5A Adverse effect of glucocorticoids and synthetic analogues, initial encounter: Secondary | ICD-10-CM

## 2022-01-23 DIAGNOSIS — E039 Hypothyroidism, unspecified: Secondary | ICD-10-CM

## 2022-01-23 DIAGNOSIS — M818 Other osteoporosis without current pathological fracture: Secondary | ICD-10-CM

## 2022-01-23 DIAGNOSIS — R5383 Other fatigue: Secondary | ICD-10-CM | POA: Diagnosis not present

## 2022-01-23 DIAGNOSIS — R202 Paresthesia of skin: Secondary | ICD-10-CM | POA: Diagnosis not present

## 2022-01-23 NOTE — Patient Instructions (Signed)
Decrease clonazepam 0.5 mg once in am x 2 weeks, then discontinue it.  Recommend counseling.  Labs today.

## 2022-01-23 NOTE — Telephone Encounter (Signed)
Dr. Tobie Poet reviewed with patient at her scheduled appointment.

## 2022-01-23 NOTE — Progress Notes (Signed)
Acute Office Visit  Subjective:    Patient ID: Connie Coffey, female    DOB: 08-12-1955, 66 y.o.   MRN: [id number]  Chief Complaint  Patient presents with   Fatigue    HPI: Patient is in today for fatigue.  Patient's is not sleeping well.  She sleeps 3 hours at a time.  She gets up to go to the bathroom she gets up because her hands hurt.  She gets up because she is worried.  She was previously on trazodone which helped.  She then just did not feel like she needed it so discontinued it.  Pain clinic recommended she come off of the clonazepam.  We left her message yesterday but I have rediscussed that with her today.  Denies snoring.  Denies daytime somnolence.  Past Medical History:  Diagnosis Date   Adrenal insufficiency (HCC)    Anxiety    Arthritis    Carpal tunnel syndrome    Both hands, worse in right   Chronic constipation    COPD (chronic obstructive pulmonary disease) (HCC)    CVA (cerebral vascular accident) ([place])    Depression    Facet syndrome    Fibromyalgia    GERD (gastroesophageal reflux disease)    Heart murmur    Hypercholesteremia    IBS (irritable bowel syndrome)    Ischemic stroke ([place])    07/19/2004   Lumbar radiculopathy    Osteoporosis    Radiculopathy, lumbar region 05/02/2021   RLS (restless legs syndrome)    Spinal stenosis    TMJ (temporomandibular joint disorder)    Trochanteric bursitis of both hips    Vascular malformation    spinal cord    Past Surgical History:  Procedure Laterality Date   APPENDECTOMY     CARPAL TUNNEL RELEASE Right 12/13/2017   surgery   CHOLECYSTECTOMY     COLONOSCOPY  05/06/2017   Colonic polyps status post polypectomy. Interan land external hemorrhoids.    ESOPHAGOGASTRODUODENOSCOPY  04/28/2007   Mild gastrtiis. Otherwise normal EGD.    HEMORROIDECTOMY     LAMINECTOMY AND MICRODISCECTOMY LUMBAR SPINE  01/03/2005   NECK SURGERY     x2    OTHER SURGICAL HISTORY  04/24/2004   anterior cervical discectomy and  fusion at C5-C6   SPINAL CORD STIMULATOR INSERTION N/A 07/21/2021   Procedure: Permanant Spinal cord stimulator placement leads and battery under fluroscopy;  Surgeon: Reece Agar, MD;  Location: [facility];  Service: Neurosurgery;  Laterality: N/A;   TONSILLECTOMY     TOTAL ABDOMINAL HYSTERECTOMY      Family History  Problem Relation Age of Onset   Supraventricular tachycardia Mother    Dementia Mother    Hyperlipidemia Mother    Hypertension Mother    Osteoarthritis Mother    Osteoporosis Mother    Breast cancer Other    Breast cancer Other    Colon cancer Neg Hx    Esophageal cancer Neg Hx     Social History   Socioeconomic History   Marital status: Divorced    Spouse name: Not on file   Number of children: 1   Years of education: Not on file   Highest education level: Not on file  Occupational History   Occupation: Disabled  Tobacco Use   Smoking status: Every Day    Packs/day: 1.00    Types: Cigarettes   Smokeless tobacco: Never  Vaping Use   Vaping Use: Never used  Substance and Sexual Activity   Alcohol use:  Not Currently    Comment: quit 19 years   Drug use: Never   Sexual activity: Not Currently  Other Topics Concern   Not on file  Social History Narrative   Disabled son lives with her sometimes and his father sometimes.  Laynee is working hard to have a relationship with her son's father and his wife.  They help her when needed.   Social Determinants of Health   Financial Resource Strain: Low Risk  (11/15/2021)   Overall Financial Resource Strain (CARDIA)    Difficulty of Paying Living Expenses: Not hard at all  Food Insecurity: No Food Insecurity (06/30/2019)   Hunger Vital Sign    Worried About Running Out of Food in the Last Year: Never true    [place] in the Last Year: Never true  Transportation Needs: Unmet Transportation Needs (04/12/2021)   PRAPARE - Hydrologist (Medical): Yes    Lack of Transportation  (Non-Medical): Yes  Physical Activity: Inactive (11/15/2021)   Exercise Vital Sign    Days of Exercise per Week: 0 days    Minutes of Exercise per Session: 0 min  Stress: Stress Concern Present (06/30/2019)   [place]    Feeling of Stress : Very much  Social Connections: Not on file  Intimate Partner Violence: Not on file    Outpatient Medications Prior to Visit  Medication Sig Dispense Refill   Abaloparatide (TYMLOS) 3120 MCG/1.56ML SOPN Inject 80 mcg into the skin daily. 4.68 mL 1   albuterol (VENTOLIN HFA) 108 (90 Base) MCG/ACT inhaler Inhale 2 puffs into the lungs every 4 (four) hours as needed. 18 g 2   atorvastatin (LIPITOR) 40 MG tablet Take 1 tablet (40 mg total) by mouth daily. 90 tablet 3   Azelastine HCl 137 MCG/SPRAY SOLN Place 2 sprays into the nose in the morning and at bedtime. 30 mL 11   bethanechol (URECHOLINE) 25 MG tablet Take 1 tablet (25 mg total) by mouth in the morning and at bedtime. 180 tablet 1   busPIRone (BUSPAR) 30 MG tablet TAKE 1 TABLET BY MOUTH TWICE A DAY 180 tablet 1   clonazePAM (KLONOPIN) 0.5 MG tablet TAKE 1 TABLET BY MOUTH TWICE A DAY 60 tablet 2   cyclobenzaprine (FLEXERIL) 10 MG tablet TAKE 1 TABLET BY MOUTH 3  TIMES DAILY 270 tablet 1   DULoxetine (CYMBALTA) 60 MG capsule TAKE 1 CAPSULE BY MOUTH  DAILY 90 capsule 3   fenofibrate 160 MG tablet TAKE 1 TABLET BY MOUTH EVERY DAY 90 tablet 1   fluticasone (FLONASE) 50 MCG/ACT nasal spray Place 2 sprays into both nostrils daily. 16 g 11   guaiFENesin (MUCINEX) 600 MG 12 hr tablet Take 600 mg by mouth 2 (two) times daily as needed for cough or to loosen phlegm.     hydrocortisone (CORTEF) 10 MG tablet Take 10 mg by mouth 2 (two) times daily.     Insulin Pen Needle (GLOBAL EASY GLIDE PEN NEEDLES) 32G X 4 MM MISC Use one needle daily as directed     levothyroxine (SYNTHROID) 75 MCG tablet TAKE 1 TABLET BY MOUTH EVERY DAY BEFORE BREAKFAST 90  tablet 0   lubiprostone (AMITIZA) 8 MCG capsule TAKE 1 CAPSULE BY MOUTH TWICE  DAILY WITH MEALS 180 capsule 3   montelukast (SINGULAIR) 10 MG tablet TAKE 1 TABLET BY MOUTH  DAILY 90 tablet 3   Multiple Vitamin (MULTI-VITAMINS) TABS Take 1 tablet  by mouth daily.     nitrofurantoin (MACRODANTIN) 100 MG capsule Take 100 mg by mouth daily.     olopatadine (PATANOL) 0.1 % ophthalmic solution Place 1 drop into both eyes daily as needed for allergies.     omeprazole (PRILOSEC) 40 MG capsule TAKE 1 CAPSULE BY MOUTH  TWICE DAILY 180 capsule 3   ondansetron (ZOFRAN) 4 MG tablet Take 1 tablet (4 mg total) by mouth every 8 (eight) hours as needed for nausea or vomiting. 40 tablet 0   oxyCODONE-acetaminophen (PERCOCET) 10-325 MG tablet TAKE 1 TAB EVERY 4 HOURS FOR 5 DAYS THEN 1 TAB TWICE A DAY AS NEEDED FOR SEVERE BREAKTHROUGH PAIN  0   primidone (MYSOLINE) 50 MG tablet TAKE 1 TABLET BY MOUTH TWICE A DAY 180 tablet 1   Propylene Glycol (SYSTANE BALANCE) 0.6 % SOLN Place 1 drop into both eyes daily as needed (dry eyes).     SPIRIVA RESPIMAT 2.5 MCG/ACT AERS USE 1 INHALATION BY MOUTH  TWICE DAILY 12 g 3   Vitamin D, Ergocalciferol, (DRISDOL) 50000 units CAPS capsule Take 50,000 Units by mouth every Wednesday.     cetirizine (ZYRTEC) 10 MG tablet Take 1 tablet (10 mg total) by mouth at bedtime. 90 tablet 2   gabapentin (NEURONTIN) 600 MG tablet TAKE 1 TABLET BY MOUTH 4  TIMES DAILY 360 tablet 3   levocetirizine (XYZAL) 5 MG tablet Take 5 mg by mouth every evening.     D-Mannose 500 MG CAPS Take 500 mg by mouth in the morning and at bedtime.     diclofenac Sodium (VOLTAREN) 1 % GEL Apply 20 g topically 4 (four) times daily. (Patient taking differently: Apply 2 g topically 4 (four) times daily as needed (pain).) 20 g 0   DYMISTA 137-50 MCG/ACT SUSP One spray each nostril twice a day. 69 g 1   nystatin (MYCOSTATIN) 100000 UNIT/ML suspension Take 5 mLs (500,000 Units total) by mouth 4 (four) times daily. 60 mL 2    No facility-administered medications prior to visit.    Allergies  Allergen Reactions   Beta Vulgaris Anaphylaxis    Beets    Penicillins Shortness Of Breath   Celebrex [Celecoxib]     "because of stomach ulcers"   Chantix [Varenicline]     Makes pt sick on her stomach    Dog Epithelium Allergy Skin Test     Cats and dogs   Fish Oil Other (See Comments)    hemorrhoids   Gramineae Pollens    Molds & Smuts    Nsaids Other (See Comments)    GI Upset. Ulcers   Nicotrol [Nicotine] Nausea Only    Review of Systems  Constitutional:  Positive for fatigue. Negative for chills and fever.  Respiratory:  Positive for cough.   Cardiovascular:  Negative for chest pain.  Gastrointestinal:  Positive for constipation.  Musculoskeletal:  Positive for back pain.  Neurological:  Positive for headaches.       Balance issues        Objective:    Physical Exam Vitals reviewed.  Constitutional:      Appearance: Normal appearance. She is normal weight.  HENT:     Right Ear: Tympanic membrane normal.     Left Ear: Tympanic membrane normal.     Nose: Nose normal.     Mouth/Throat:     Pharynx: No oropharyngeal exudate or posterior oropharyngeal erythema.  Neck:     Vascular: No carotid bruit.  Cardiovascular:     Rate  and Rhythm: Normal rate and regular rhythm.     Heart sounds: Normal heart sounds.  Pulmonary:     Effort: Pulmonary effort is normal. No respiratory distress.     Breath sounds: Normal breath sounds.  Abdominal:     General: Bowel sounds are normal.     Palpations: Abdomen is soft.     Tenderness: There is no abdominal tenderness.  Neurological:     Mental Status: She is alert and oriented to person, place, and time.  Psychiatric:        Mood and Affect: Mood normal.        Behavior: Behavior normal.     BP 120/68   Pulse 100   Temp (!) 97.3 F (36.3 C)   Resp 16   Ht '5\' 4"'  (1.626 m)   Wt 139 lb (63 kg)   BMI 23.86 kg/m  Wt Readings from Last 3  Encounters:  01/26/22 140 lb 6.4 oz (63.7 kg)  01/23/22 139 lb (63 kg)  12/08/21 137 lb (62.1 kg)    Health Maintenance Due  Topic Date Due   Zoster Vaccines- Shingrix (1 of 2) Never done   COVID-19 Vaccine (4 - Pfizer risk series) 07/03/2021   INFLUENZA VACCINE  12/26/2021    There are no preventive care reminders to display for this patient.   Lab Results  Component Value Date   TSH 0.896 01/23/2022   Lab Results  Component Value Date   WBC 12.6 (H) 01/23/2022   HGB 13.7 01/23/2022   HCT 42.3 01/23/2022   MCV 87 01/23/2022   PLT 488 (H) 01/23/2022   Lab Results  Component Value Date   NA 145 (H) 01/23/2022   K 4.0 01/23/2022   CO2 28 01/23/2022   GLUCOSE 101 (H) 01/23/2022   BUN 16 01/23/2022   CREATININE 0.78 01/23/2022   BILITOT 0.2 01/23/2022   ALKPHOS 49 01/23/2022   AST 14 01/23/2022   ALT 11 01/23/2022   PROT 6.4 01/23/2022   ALBUMIN 4.4 01/23/2022   CALCIUM 10.2 01/23/2022   ANIONGAP 8 07/21/2021   EGFR 83 01/23/2022   Lab Results  Component Value Date   CHOL 165 12/08/2021   Lab Results  Component Value Date   HDL 77 12/08/2021   Lab Results  Component Value Date   LDLCALC 67 12/08/2021   Lab Results  Component Value Date   TRIG 124 12/08/2021   Lab Results  Component Value Date   CHOLHDL 2.1 12/08/2021   No results found for: "HGBA1C"     Assessment & Plan:   Problem List Items Addressed This Visit       Endocrine   Acquired hypothyroidism   Relevant Orders   T4, free (Completed)   TSH (Completed)     Musculoskeletal and Integument   Steroid-induced osteoporosis   Relevant Orders   VITAMIN D 25 Hydroxy (Vit-D Deficiency, Fractures) (Completed)   Other Visit Diagnoses     Other fatigue    -  Primary   Relevant Orders   CBC with Differential/Platelet (Completed)   Comprehensive metabolic panel (Completed)   Paresthesia       Relevant Orders   Methylmalonic acid, serum (Completed)   B12 and Folate Panel (Completed)       No orders of the defined types were placed in this encounter.   Orders Placed This Encounter  Procedures   CBC with Differential/Platelet   Comprehensive metabolic panel   T4, free   TSH   Methylmalonic  acid, serum   B12 and Folate Panel   VITAMIN D 25 Hydroxy (Vit-D Deficiency, Fractures)     Follow-up: Return in about 6 weeks (around 03/06/2022).  An After Visit Summary was printed and given to the patient.  [name], MD [facility] [phone]

## 2022-01-25 ENCOUNTER — Other Ambulatory Visit: Payer: Self-pay | Admitting: Legal Medicine

## 2022-01-25 LAB — COMPREHENSIVE METABOLIC PANEL
ALT: 11 IU/L (ref 0–32)
AST: 14 IU/L (ref 0–40)
Albumin/Globulin Ratio: 2.2 (ref 1.2–2.2)
Albumin: 4.4 g/dL (ref 3.9–4.9)
Alkaline Phosphatase: 49 IU/L (ref 44–121)
BUN/Creatinine Ratio: 21 (ref 12–28)
BUN: 16 mg/dL (ref 8–27)
Bilirubin Total: 0.2 mg/dL (ref 0.0–1.2)
CO2: 28 mmol/L (ref 20–29)
Calcium: 10.2 mg/dL (ref 8.7–10.3)
Chloride: 103 mmol/L (ref 96–106)
Creatinine, Ser: 0.78 mg/dL (ref 0.57–1.00)
Globulin, Total: 2 g/dL (ref 1.5–4.5)
Glucose: 101 mg/dL — ABNORMAL HIGH (ref 70–99)
Potassium: 4 mmol/L (ref 3.5–5.2)
Sodium: 145 mmol/L — ABNORMAL HIGH (ref 134–144)
Total Protein: 6.4 g/dL (ref 6.0–8.5)
eGFR: 83 mL/min/{1.73_m2} (ref >59)

## 2022-01-25 LAB — VITAMIN D 25 HYDROXY (VIT D DEFICIENCY, FRACTURES): Vit D, 25-Hydroxy: 53.5 ng/mL (ref 30.0–100.0)

## 2022-01-25 LAB — CBC WITH DIFFERENTIAL/PLATELET
Basophils Absolute: 0.1 10*3/uL (ref 0.0–0.2)
Basos: 1 %
EOS (ABSOLUTE): 0.2 10*3/uL (ref 0.0–0.4)
Eos: 1 %
Hematocrit: 42.3 % (ref 34.0–46.6)
Hemoglobin: 13.7 g/dL (ref 11.1–15.9)
Immature Grans (Abs): 0 10*3/uL (ref 0.0–0.1)
Immature Granulocytes: 0 %
Lymphocytes Absolute: 2.2 10*3/uL (ref 0.7–3.1)
Lymphs: 18 %
MCH: 28.3 pg (ref 26.6–33.0)
MCHC: 32.4 g/dL (ref 31.5–35.7)
MCV: 87 fL (ref 79–97)
Monocytes Absolute: 0.8 10*3/uL (ref 0.1–0.9)
Monocytes: 6 %
Neutrophils Absolute: 9.4 10*3/uL — ABNORMAL HIGH (ref 1.4–7.0)
Neutrophils: 74 %
Platelets: 488 10*3/uL — ABNORMAL HIGH (ref 150–450)
RBC: 4.84 x10E6/uL (ref 3.77–5.28)
RDW: 13.7 % (ref 11.7–15.4)
WBC: 12.6 10*3/uL — ABNORMAL HIGH (ref 3.4–10.8)

## 2022-01-25 LAB — B12 AND FOLATE PANEL
Folate: 15.2 ng/mL (ref >3.0)
Vitamin B-12: 813 pg/mL (ref 232–1245)

## 2022-01-25 LAB — METHYLMALONIC ACID, SERUM: Methylmalonic Acid: 207 nmol/L (ref 0–378)

## 2022-01-25 LAB — TSH: TSH: 0.896 u[IU]/mL (ref 0.450–4.500)

## 2022-01-25 LAB — T4, FREE: Free T4: 1.64 ng/dL (ref 0.82–1.77)

## 2022-01-26 ENCOUNTER — Other Ambulatory Visit: Payer: Self-pay | Admitting: Nurse Practitioner

## 2022-01-26 ENCOUNTER — Ambulatory Visit (INDEPENDENT_AMBULATORY_CARE_PROVIDER_SITE_OTHER): Payer: 59 | Admitting: Family Medicine

## 2022-01-26 ENCOUNTER — Encounter: Payer: Self-pay | Admitting: Family Medicine

## 2022-01-26 VITALS — BP 142/68 | HR 79 | Resp 18 | Ht 63.0 in | Wt 140.4 lb

## 2022-01-26 DIAGNOSIS — F411 Generalized anxiety disorder: Secondary | ICD-10-CM | POA: Diagnosis not present

## 2022-01-26 DIAGNOSIS — L509 Urticaria, unspecified: Secondary | ICD-10-CM

## 2022-01-26 DIAGNOSIS — R058 Other specified cough: Secondary | ICD-10-CM

## 2022-01-26 MED ORDER — TRIAMCINOLONE ACETONIDE 40 MG/ML IJ SUSP
80.0000 mg | Freq: Once | INTRAMUSCULAR | Status: AC
  Administered 2022-01-26: 80 mg via INTRAMUSCULAR

## 2022-01-26 MED ORDER — HYDROXYZINE PAMOATE 25 MG PO CAPS: 25.0000 mg | ORAL_CAPSULE | Freq: Three times a day (TID) | ORAL | 2 refills | Status: DC | PRN

## 2022-01-26 NOTE — Patient Instructions (Addendum)
Increase hydrocortisone to 50 mg daily.   Start hydroxyzine 25 mg three times a day as needed anxiety or itching.   Decrease clonazepam to once daily in am x 2 weeks, then stop.

## 2022-01-26 NOTE — Progress Notes (Signed)
Acute Office Visit  Subjective:    Patient ID: Connie Coffey, female    DOB: [birth year], 66 y.o.   MRN: [id number]  Chief Complaint  Patient presents with   Rash    HPI: Patient is in today for rash on neck, face, ears. Rash began Thursday night and has gradually worsened. Complaining of itching, redness, headache, sore throat, ear pain. Denies any new medications, animals, clothes detergent., etc.   Past Medical History:  Diagnosis Date   Adrenal insufficiency (HCC)    Anxiety    Arthritis    Carpal tunnel syndrome    Both hands, worse in right   Chronic constipation    COPD (chronic obstructive pulmonary disease) (HCC)    CVA (cerebral vascular accident) ([place])    Depression    Facet syndrome    Fibromyalgia    GERD (gastroesophageal reflux disease)    Heart murmur    Hypercholesteremia    IBS (irritable bowel syndrome)    Ischemic stroke ([place])    07/19/2004   Lumbar radiculopathy    Osteoporosis    Radiculopathy, lumbar region 05/02/2021   RLS (restless legs syndrome)    Spinal stenosis    TMJ (temporomandibular joint disorder)    Trochanteric bursitis of both hips    Vascular malformation    spinal cord    Past Surgical History:  Procedure Laterality Date   APPENDECTOMY     CARPAL TUNNEL RELEASE Right 12/13/2017   surgery   CHOLECYSTECTOMY     COLONOSCOPY  05/06/2017   Colonic polyps status post polypectomy. Interan land external hemorrhoids.    ESOPHAGOGASTRODUODENOSCOPY  04/28/2007   Mild gastrtiis. Otherwise normal EGD.    HEMORROIDECTOMY     LAMINECTOMY AND MICRODISCECTOMY LUMBAR SPINE  01/03/2005   NECK SURGERY     x2    OTHER SURGICAL HISTORY  04/24/2004   anterior cervical discectomy and fusion at C5-C6   SPINAL CORD STIMULATOR INSERTION N/A 07/21/2021   Procedure: Permanant Spinal cord stimulator placement leads and battery under fluroscopy;  Surgeon: Reece Agar, MD;  Location: [facility];  Service: Neurosurgery;  Laterality: N/A;    TONSILLECTOMY     TOTAL ABDOMINAL HYSTERECTOMY      Family History  Problem Relation Age of Onset   Supraventricular tachycardia Mother    Dementia Mother    Hyperlipidemia Mother    Hypertension Mother    Osteoarthritis Mother    Osteoporosis Mother    Breast cancer Other    Breast cancer Other    Colon cancer Neg Hx    Esophageal cancer Neg Hx     Social History   Socioeconomic History   Marital status: Divorced    Spouse name: Not on file   Number of children: 1   Years of education: Not on file   Highest education level: Not on file  Occupational History   Occupation: Disabled  Tobacco Use   Smoking status: Every Day    Packs/day: 1.00    Types: Cigarettes   Smokeless tobacco: Never  Vaping Use   Vaping Use: Never used  Substance and Sexual Activity   Alcohol use: Not Currently    Comment: quit 19 years   Drug use: Never   Sexual activity: Not Currently  Other Topics Concern   Not on file  Social History Narrative   Disabled son lives with her sometimes and his father sometimes.  Connie Coffey is working hard to have a relationship with her son's father and his wife.  They help her when needed.   Social Determinants of Health   Financial Resource Strain: Low Risk  (11/15/2021)   Overall Financial Resource Strain (CARDIA)    Difficulty of Paying Living Expenses: Not hard at all  Food Insecurity: No Food Insecurity (06/30/2019)   Hunger Vital Sign    Worried About Running Out of Food in the Last Year: Never true    [place] in the Last Year: Never true  Transportation Needs: Unmet Transportation Needs (04/12/2021)   PRAPARE - Hydrologist (Medical): Yes    Lack of Transportation (Non-Medical): Yes  Physical Activity: Inactive (11/15/2021)   Exercise Vital Sign    Days of Exercise per Week: 0 days    Minutes of Exercise per Session: 0 min  Stress: Stress Concern Present (06/30/2019)   [place]    Feeling of Stress : Very much  Social Connections: Not on file  Intimate Partner Violence: Not on file    Outpatient Medications Prior to Visit  Medication Sig Dispense Refill   Abaloparatide (TYMLOS) 3120 MCG/1.56ML SOPN Inject 80 mcg into the skin daily. 4.68 mL 1   albuterol (VENTOLIN HFA) 108 (90 Base) MCG/ACT inhaler Inhale 2 puffs into the lungs every 4 (four) hours as needed. 18 g 2   atorvastatin (LIPITOR) 40 MG tablet Take 1 tablet (40 mg total) by mouth daily. 90 tablet 3   Azelastine HCl 137 MCG/SPRAY SOLN Place 2 sprays into the nose in the morning and at bedtime. 30 mL 11   bethanechol (URECHOLINE) 25 MG tablet Take 1 tablet (25 mg total) by mouth in the morning and at bedtime. 180 tablet 1   busPIRone (BUSPAR) 30 MG tablet TAKE 1 TABLET BY MOUTH TWICE A DAY 180 tablet 1   clonazePAM (KLONOPIN) 0.5 MG tablet TAKE 1 TABLET BY MOUTH TWICE A DAY 60 tablet 2   cyclobenzaprine (FLEXERIL) 10 MG tablet TAKE 1 TABLET BY MOUTH 3  TIMES DAILY 270 tablet 1   DULoxetine (CYMBALTA) 60 MG capsule TAKE 1 CAPSULE BY MOUTH  DAILY 90 capsule 3   fenofibrate 160 MG tablet TAKE 1 TABLET BY MOUTH EVERY DAY 90 tablet 1   fluticasone (FLONASE) 50 MCG/ACT nasal spray Place 2 sprays into both nostrils daily. 16 g 11   gabapentin (NEURONTIN) 600 MG tablet TAKE 1 TABLET BY MOUTH 4  TIMES DAILY 360 tablet 3   guaiFENesin (MUCINEX) 600 MG 12 hr tablet Take 600 mg by mouth 2 (two) times daily as needed for cough or to loosen phlegm.     hydrocortisone (CORTEF) 10 MG tablet Take 10 mg by mouth 2 (two) times daily.     Insulin Pen Needle (GLOBAL EASY GLIDE PEN NEEDLES) 32G X 4 MM MISC Use one needle daily as directed     levocetirizine (XYZAL) 5 MG tablet TAKE 1 TABLET BY MOUTH EVERY DAY IN THE EVENING 90 tablet 3   levothyroxine (SYNTHROID) 75 MCG tablet TAKE 1 TABLET BY MOUTH EVERY DAY BEFORE BREAKFAST 90 tablet 0   lubiprostone (AMITIZA) 8 MCG capsule TAKE 1 CAPSULE BY  MOUTH TWICE  DAILY WITH MEALS 180 capsule 3   montelukast (SINGULAIR) 10 MG tablet TAKE 1 TABLET BY MOUTH  DAILY 90 tablet 3   Multiple Vitamin (MULTI-VITAMINS) TABS Take 1 tablet by mouth daily.     nitrofurantoin (MACRODANTIN) 100 MG capsule Take 100 mg by mouth daily.     olopatadine (  PATANOL) 0.1 % ophthalmic solution Place 1 drop into both eyes daily as needed for allergies.     omeprazole (PRILOSEC) 40 MG capsule TAKE 1 CAPSULE BY MOUTH  TWICE DAILY 180 capsule 3   ondansetron (ZOFRAN) 4 MG tablet Take 1 tablet (4 mg total) by mouth every 8 (eight) hours as needed for nausea or vomiting. 40 tablet 0   oxyCODONE-acetaminophen (PERCOCET) 10-325 MG tablet TAKE 1 TAB EVERY 4 HOURS FOR 5 DAYS THEN 1 TAB TWICE A DAY AS NEEDED FOR SEVERE BREAKTHROUGH PAIN  0   primidone (MYSOLINE) 50 MG tablet TAKE 1 TABLET BY MOUTH TWICE A DAY 180 tablet 1   Propylene Glycol (SYSTANE BALANCE) 0.6 % SOLN Place 1 drop into both eyes daily as needed (dry eyes).     SPIRIVA RESPIMAT 2.5 MCG/ACT AERS USE 1 INHALATION BY MOUTH  TWICE DAILY 12 g 3   Vitamin D, Ergocalciferol, (DRISDOL) 50000 units CAPS capsule Take 50,000 Units by mouth every Wednesday.     No facility-administered medications prior to visit.    Allergies  Allergen Reactions   Beta Vulgaris Anaphylaxis    Beets    Penicillins Shortness Of Breath   Celebrex [Celecoxib]     "because of stomach ulcers"   Chantix [Varenicline]     Makes pt sick on her stomach    Dog Epithelium Allergy Skin Test     Cats and dogs   Fish Oil Other (See Comments)    hemorrhoids   Gramineae Pollens    Molds & Smuts    Nsaids Other (See Comments)    GI Upset. Ulcers   Nicotrol [Nicotine] Nausea Only    Review of Systems  Constitutional:  Positive for fatigue. Negative for appetite change and fever.  HENT:  Positive for ear pain and sore throat. Negative for congestion and sinus pressure.   Respiratory:  Negative for cough, shortness of breath and wheezing.    Cardiovascular:  Negative for chest pain and palpitations.  Gastrointestinal:  Negative for abdominal pain, constipation, diarrhea, nausea and vomiting.  Musculoskeletal:  Negative for arthralgias, back pain, joint swelling and myalgias.  Skin:  Positive for rash.  Neurological:  Positive for headaches. Negative for dizziness and weakness.  Psychiatric/Behavioral:  Negative for dysphoric mood. The patient is not nervous/anxious.        Objective:    Physical Exam Vitals reviewed.  Constitutional:      Appearance: Normal appearance.  HENT:     Mouth/Throat:     Pharynx: No oropharyngeal exudate or posterior oropharyngeal erythema.  Skin:    Findings: Rash present. Rash is urticarial (on left side of neck. on face. behind ears, forearms.).  Neurological:     Mental Status: She is alert.     BP (!) 142/68   Pulse 79   Resp 18   Ht '5\' 3"'  (1.6 m)   Wt 140 lb 6.4 oz (63.7 kg)   SpO2 96%   BMI 24.87 kg/m  Wt Readings from Last 3 Encounters:  01/26/22 140 lb 6.4 oz (63.7 kg)  01/23/22 139 lb (63 kg)  12/08/21 137 lb (62.1 kg)    Health Maintenance Due  Topic Date Due   Zoster Vaccines- Shingrix (1 of 2) Never done   COVID-19 Vaccine (4 - Pfizer risk series) 07/03/2021   INFLUENZA VACCINE  12/26/2021    There are no preventive care reminders to display for this patient.   Lab Results  Component Value Date   TSH 0.896 01/23/2022  Lab Results  Component Value Date   WBC 12.6 (H) 01/23/2022   HGB 13.7 01/23/2022   HCT 42.3 01/23/2022   MCV 87 01/23/2022   PLT 488 (H) 01/23/2022   Lab Results  Component Value Date   NA 145 (H) 01/23/2022   K 4.0 01/23/2022   CO2 28 01/23/2022   GLUCOSE 101 (H) 01/23/2022   BUN 16 01/23/2022   CREATININE 0.78 01/23/2022   BILITOT 0.2 01/23/2022   ALKPHOS 49 01/23/2022   AST 14 01/23/2022   ALT 11 01/23/2022   PROT 6.4 01/23/2022   ALBUMIN 4.4 01/23/2022   CALCIUM 10.2 01/23/2022   ANIONGAP 8 07/21/2021   EGFR 83  01/23/2022   Lab Results  Component Value Date   CHOL 165 12/08/2021   Lab Results  Component Value Date   HDL 77 12/08/2021   Lab Results  Component Value Date   LDLCALC 67 12/08/2021   Lab Results  Component Value Date   TRIG 124 12/08/2021   Lab Results  Component Value Date   CHOLHDL 2.1 12/08/2021   No results found for: "HGBA1C"     Assessment & Plan:   Problem List Items Addressed This Visit       Musculoskeletal and Integument   Urticaria - Primary    Kenalog shot given. Increase cortef to 50 mg total daily x 5 days then return to 30 mg total daily.  Hydroxyzine 25 mg three times a day.       Relevant Medications   triamcinolone acetonide (KENALOG-40) injection 80 mg (Start on 01/26/2022  9:30 AM)     Other   Anxiety state    Start hydroxyzine 25 mg three times a day.  Weaning off clonazepam      Relevant Medications   hydrOXYzine (VISTARIL) 25 MG capsule   Meds ordered this encounter  Medications   hydrOXYzine (VISTARIL) 25 MG capsule    Sig: Take 1 capsule (25 mg total) by mouth every 8 (eight) hours as needed for anxiety or itching.    Dispense:  90 capsule    Refill:  2   triamcinolone acetonide (KENALOG-40) injection 80 mg     Follow-up: Return if symptoms worsen or fail to improve.  An After Visit Summary was printed and given to the patient.   Connie Brome, MD [facility] [phone]

## 2022-01-26 NOTE — Assessment & Plan Note (Signed)
Kenalog shot given. Increase cortef to 50 mg total daily x 5 days then return to 30 mg total daily.  Hydroxyzine 25 mg three times a day.

## 2022-01-26 NOTE — Assessment & Plan Note (Signed)
Start hydroxyzine 25 mg three times a day.  Weaning off clonazepam

## 2022-01-27 ENCOUNTER — Encounter: Payer: Self-pay | Admitting: Family Medicine

## 2022-02-08 ENCOUNTER — Telehealth: Payer: Self-pay

## 2022-02-08 NOTE — Patient Outreach (Signed)
  Care Coordination   Initial Visit Note   02/08/2022 Name: Connie Coffey MRN: [id number] DOB: Jul 26, 1955  Connie Coffey is a 66 y.o. year old female who sees Cox, Kirsten, MD for primary care. I spoke with  Connie Coffey by phone today.  What matters to the patients health and wellness today?  Placed call to patient to review [facility] care coordination program.  Patient reports interested in program.  Appointment scheduled for 02/12/2022      SDOH assessments and interventions completed:  No     Care Coordination Interventions Activated:  No  Care Coordination Interventions:  No, not indicated   Follow up plan: Follow up call scheduled for 02/12/2022    Encounter Outcome:  Pt. Scheduled   Tomasa Rand, RN, BSN, [place] Coordinator [phone]

## 2022-02-08 NOTE — Progress Notes (Signed)
Chronic Care Management Pharmacy Assistant   Name: Connie Coffey  MRN: [id number] DOB: [birth year]    Reason for Encounter: General Adherence Call    Recent office visits:  01/26/22 Connie Brome MD. Seen for Urticaria. Started on Hydroxyzine Pamoate '25mg'$ . Decrease clonazepam to once daily in am x 2 weeks, then stop.Increase hydrocortisone to 50 mg daily.   01/23/22 Connie Brome MD. Seen for fatigue. Decrease clonazepam 0.5 mg once in am x 2 weeks, then discontinue it.   01/19/22 Coffey, Kirsten MD. Orders Only. Recommend decrease clonazepam 0.5 mg once daily x 2 weeks, then discontinue. Recommend start on buspirone 5 mg three times a day  Recent consult visits:  None  Hospital visits:  None  Medications: Outpatient Encounter Medications as of 02/08/2022  Medication Sig   Abaloparatide (TYMLOS) 3120 MCG/1.56ML SOPN Inject 80 mcg into the skin daily.   albuterol (VENTOLIN HFA) 108 (90 Base) MCG/ACT inhaler Inhale 2 puffs into the lungs every 4 (four) hours as needed.   atorvastatin (LIPITOR) 40 MG tablet Take 1 tablet (40 mg total) by mouth daily.   Azelastine HCl 137 MCG/SPRAY SOLN Place 2 sprays into the nose in the morning and at bedtime.   benzonatate (TESSALON) 100 MG capsule TAKE 1 CAPSULE BY MOUTH 2 TIMES DAILY AS NEEDED FOR COUGH.   bethanechol (URECHOLINE) 25 MG tablet Take 1 tablet (25 mg total) by mouth in the morning and at bedtime.   busPIRone (BUSPAR) 30 MG tablet TAKE 1 TABLET BY MOUTH TWICE A DAY   clonazePAM (KLONOPIN) 0.5 MG tablet TAKE 1 TABLET BY MOUTH TWICE A DAY   cyclobenzaprine (FLEXERIL) 10 MG tablet TAKE 1 TABLET BY MOUTH 3  TIMES DAILY   DULoxetine (CYMBALTA) 60 MG capsule TAKE 1 CAPSULE BY MOUTH  DAILY   fenofibrate 160 MG tablet TAKE 1 TABLET BY MOUTH EVERY DAY   fluticasone (FLONASE) 50 MCG/ACT nasal spray Place 2 sprays into both nostrils daily.   gabapentin (NEURONTIN) 600 MG tablet TAKE 1 TABLET BY MOUTH 4 TIMES  DAILY   guaiFENesin (MUCINEX) 600 MG 12  hr tablet Take 600 mg by mouth 2 (two) times daily as needed for cough or to loosen phlegm.   hydrocortisone (CORTEF) 10 MG tablet Take 10 mg by mouth 2 (two) times daily.   hydrOXYzine (VISTARIL) 25 MG capsule Take 1 capsule (25 mg total) by mouth every 8 (eight) hours as needed for anxiety or itching.   Insulin Pen Needle (GLOBAL EASY GLIDE PEN NEEDLES) 32G X 4 MM MISC Use one needle daily as directed   levocetirizine (XYZAL) 5 MG tablet TAKE 1 TABLET BY MOUTH EVERY DAY IN THE EVENING   levothyroxine (SYNTHROID) 75 MCG tablet TAKE 1 TABLET BY MOUTH EVERY DAY BEFORE BREAKFAST   lubiprostone (AMITIZA) 8 MCG capsule TAKE 1 CAPSULE BY MOUTH TWICE  DAILY WITH MEALS   montelukast (SINGULAIR) 10 MG tablet TAKE 1 TABLET BY MOUTH  DAILY   Multiple Vitamin (MULTI-VITAMINS) TABS Take 1 tablet by mouth daily.   nitrofurantoin (MACRODANTIN) 100 MG capsule Take 100 mg by mouth daily.   olopatadine (PATANOL) 0.1 % ophthalmic solution Place 1 drop into both eyes daily as needed for allergies.   omeprazole (PRILOSEC) 40 MG capsule TAKE 1 CAPSULE BY MOUTH  TWICE DAILY   ondansetron (ZOFRAN) 4 MG tablet Take 1 tablet (4 mg total) by mouth every 8 (eight) hours as needed for nausea or vomiting.   oxyCODONE-acetaminophen (PERCOCET) 10-325 MG tablet TAKE 1 TAB EVERY  4 HOURS FOR 5 DAYS THEN 1 TAB TWICE A DAY AS NEEDED FOR SEVERE BREAKTHROUGH PAIN   primidone (MYSOLINE) 50 MG tablet TAKE 1 TABLET BY MOUTH TWICE A DAY   Propylene Glycol (SYSTANE BALANCE) 0.6 % SOLN Place 1 drop into both eyes daily as needed (dry eyes).   SPIRIVA RESPIMAT 2.5 MCG/ACT AERS USE 1 INHALATION BY MOUTH  TWICE DAILY   Vitamin D, Ergocalciferol, (DRISDOL) 50000 units CAPS capsule Take 50,000 Units by mouth every Wednesday.   No facility-administered encounter medications on file as of 02/08/2022.    [place] for General Review Call   Chart Review:  Have there been any documented new, changed, or discontinued medications  since last visit? Yes  Has there been any documented recent hospitalizations or ED visits since last visit with Clinical Pharmacist? No Brief Summary (including medication and/or Diagnosis changes):   Adherence Review:  Does the Clinical Pharmacist Assistant have access to adherence rates? Yes Adherence rates for STAR metric medications (List medication(s)/day supply/ last 2 fill dates). Atorvastatin 12/06/21 - 08/29/21 100ds  Adherence rates for medications indicated for disease state being reviewed (List medication(s)/day supply/ last 2 fill dates). Does the patient have >5 day gap between last estimated fill dates for any of the above medications or other medication gaps? No Reason for medication gaps.None   Disease State Questions:  Able to connect with Patient? Yes Did patient have any problems with their health recently? Yes Note problems and Concerns:Pt has been switched medications recently for depression/anxiety and has been very emotional lately. She is dealing with an ex right now that is given her problems that is adding to her depression. Dr. Tobie Coffey referred her to a counselor to talk about her issues with someone and she is interested but has problems with transportation. I stated there is transportation services which I gave her the information for and some therapist offers virtual appts.  Have you had any admissions or emergency room visits or worsening of your condition(s) since last visit? No  Have you had any visits with new specialists or providers since your last visit? No  Have you had any new health care problem(s) since your last visit? No  Have you run out of any of your medications since you last spoke with clinical pharmacist? No  Are there any medications you are not taking as prescribed? No  Are you having any issues or side effects with your medications? No  Do you have any other health concerns or questions you want to discuss with your Clinical Pharmacist  before your next visit? No  Are there any health concerns that you feel we can do a better job addressing? No . Are you having any problems with any of the following since the last visit: (select all that apply)  Driving and Sleeping, walking   Details:Pt has a lot of back problems and neck problems 12. Any falls since last visit? No  13. Any increased or uncontrolled pain since last visit? Yes  Details:Pt is following up with the pain clinic in regards to her back.  14. Next visit Type: telephone       Visit with:Kimberly Tamala Julian LPN        TKZS:01/0/93        Time:11:00am  15. Additional Details? Yes, the nurse from Care Coordination called her today to help her with social services and is following up with pt on Monday.     [name], [place] Pharmacist Assistant  [phone]  

## 2022-02-12 ENCOUNTER — Ambulatory Visit: Payer: Self-pay

## 2022-02-12 NOTE — Patient Outreach (Signed)
  Care Coordination   02/12/2022 Name: SHANNIA JACUINDE MRN: [id number] DOB: Oct 15, 1955   Care Coordination Outreach Attempts:  An unsuccessful telephone outreach was attempted for a scheduled appointment today.  Follow Up Plan:  Additional outreach attempts will be made to offer the patient care coordination information and services.   Encounter Outcome:  No Answer  Care Coordination Interventions Activated:  No   Care Coordination Interventions:  No, not indicated    Tomasa Rand, RN, BSN, CEN [facility] [employer] [phone]

## 2022-02-14 ENCOUNTER — Telehealth: Payer: Self-pay | Admitting: *Deleted

## 2022-02-14 ENCOUNTER — Telehealth: Payer: Self-pay

## 2022-02-14 NOTE — Chronic Care Management (AMB) (Signed)
  Care Coordination  Outreach Note  02/14/2022 Name: Connie Coffey MRN: [id number] DOB: 01/31/1956   Care Coordination Outreach Attempts: An unsuccessful telephone outreach was attempted today to offer the patient information about available care coordination services as a benefit of their health plan.   Follow Up Plan:  Additional outreach attempts will be made to offer the patient care coordination information and services.   Encounter Outcome:  No Answer  Julian Hy, [place] Direct Dial: [phone]

## 2022-02-14 NOTE — Patient Outreach (Signed)
  Care Coordination   Initial Visit Note   02/14/2022 Name: Connie Coffey MRN: [id number] DOB: 1956/02/06  Connie Coffey is a 66 y.o. year old female who sees Cox, Kirsten, MD for primary care. I spoke with  Connie Coffey by phone today.  What matters to the patients health and wellness today?  " Reports like of energy and back pain"    Goals Addressed               This Visit's Progress     Chronic back pain and lack of energy (pt-stated)        Care Coordination Interventions: Discussed importance of adherence to all scheduled medical appointments Reviewed with patient prescribed pharmacological and nonpharmacological pain relief strategies Assessed social determinant of health barriers Reviewed patient attends pain clinic Reviewed back stimulator that she uses. Reviewed pain scale Reviewed that pain is worse in the am. Turns stimulator down at night. Reviewed with patient her thoughts of the reason for lack of energy. Reports she thinks part of it is smoking.  Reports that she is eating and drinking well.            SDOH assessments and interventions completed:  Yes  SDOH Interventions Today    Flowsheet Row Most Recent Value  SDOH Interventions   Food Insecurity Interventions Intervention Not Indicated  Housing Interventions Intervention Not Indicated  Transportation Interventions Intervention Not Indicated  Utilities Interventions Intervention Not Indicated        Care Coordination Interventions Activated:  Yes  Care Coordination Interventions:  Yes, provided   Follow up plan: Follow up call scheduled for 04/16/2022    Encounter Outcome:  Pt. Visit Completed   Tomasa Rand, RN, BSN, CEN [place] Coordinator [phone]

## 2022-02-17 ENCOUNTER — Other Ambulatory Visit: Payer: Self-pay | Admitting: Family Medicine

## 2022-02-20 ENCOUNTER — Telehealth: Payer: Self-pay

## 2022-02-20 NOTE — Telephone Encounter (Signed)
Connie Coffey called with multiple complaints.  She reports having to use her inhaler frequently but she also has been smoking more.  She has been having pain in her back and chest with more shortness of breath than normal.  She was able to have a normal conversation on the phone without shortness of breath.  I advised that she go to the ED for evaluation and treatment but she declined.  She has had no fever or chills.  Appointment scheduled for Thursday morning with Dr. Tobie Poet.

## 2022-02-22 ENCOUNTER — Ambulatory Visit (INDEPENDENT_AMBULATORY_CARE_PROVIDER_SITE_OTHER): Payer: 59 | Admitting: Family Medicine

## 2022-02-22 VITALS — BP 104/60 | HR 100 | Temp 97.1°F | Resp 18 | Ht 63.0 in | Wt 133.4 lb

## 2022-02-22 DIAGNOSIS — F332 Major depressive disorder, recurrent severe without psychotic features: Secondary | ICD-10-CM | POA: Diagnosis not present

## 2022-02-22 DIAGNOSIS — R0789 Other chest pain: Secondary | ICD-10-CM | POA: Diagnosis not present

## 2022-02-22 DIAGNOSIS — F411 Generalized anxiety disorder: Secondary | ICD-10-CM | POA: Diagnosis not present

## 2022-02-22 DIAGNOSIS — J440 Chronic obstructive pulmonary disease with acute lower respiratory infection: Secondary | ICD-10-CM | POA: Diagnosis not present

## 2022-02-22 DIAGNOSIS — J209 Acute bronchitis, unspecified: Secondary | ICD-10-CM

## 2022-02-22 DIAGNOSIS — F17218 Nicotine dependence, cigarettes, with other nicotine-induced disorders: Secondary | ICD-10-CM

## 2022-02-22 DIAGNOSIS — F330 Major depressive disorder, recurrent, mild: Secondary | ICD-10-CM

## 2022-02-22 MED ORDER — ARIPIPRAZOLE 2 MG PO TABS: 2.0000 mg | ORAL_TABLET | Freq: Every day | ORAL | 1 refills | Status: DC

## 2022-02-22 NOTE — Patient Instructions (Addendum)
Start abilify 2 mg once daily.  Continue hydroxyzine 25 mg three times a day.   GET LABS AND CXR AT [place].
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
## 2022-02-23 DIAGNOSIS — J441 Chronic obstructive pulmonary disease with (acute) exacerbation: Secondary | ICD-10-CM | POA: Insufficient documentation

## 2022-02-23 DIAGNOSIS — F332 Major depressive disorder, recurrent severe without psychotic features: Secondary | ICD-10-CM | POA: Insufficient documentation

## 2022-02-23 DIAGNOSIS — J209 Acute bronchitis, unspecified: Secondary | ICD-10-CM | POA: Insufficient documentation

## 2022-02-23 NOTE — Assessment & Plan Note (Signed)
Start abilify 2 mg once daily.  Continue hydroxyzine 25 mg three times a day.

## 2022-02-23 NOTE — Assessment & Plan Note (Signed)
[facility] Labs and Chest X-Ray. Covid 19 negative.

## 2022-02-24 ENCOUNTER — Encounter: Payer: Self-pay | Admitting: Family Medicine

## 2022-02-24 DIAGNOSIS — R0789 Other chest pain: Secondary | ICD-10-CM | POA: Insufficient documentation

## 2022-02-24 LAB — POC COVID19 BINAXNOW: SARS Coronavirus 2 Ag: NEGATIVE

## 2022-02-24 NOTE — Assessment & Plan Note (Signed)
Start abilify 2 mg once daily.  Continue duloxetine 60 mg daily.  Continue buspirone 30 mg twice daily.  Continue hydroxyzine 25 mg three times a day.

## 2022-02-24 NOTE — Assessment & Plan Note (Signed)
Recommend cessation. ?

## 2022-02-24 NOTE — Assessment & Plan Note (Signed)
Stat troponin I, cbc, cmp.  EKG: NSR. No ST changes.

## 2022-02-28 NOTE — Chronic Care Management (AMB) (Signed)
Pt spoke with RN and follow up scheduled

## 2022-03-06 ENCOUNTER — Ambulatory Visit: Payer: 59 | Admitting: Family Medicine

## 2022-03-12 ENCOUNTER — Telehealth: Payer: Self-pay

## 2022-03-12 NOTE — Progress Notes (Signed)
Chronic Care Management Pharmacy Assistant   Name: Connie Coffey  MRN: [id number] DOB: 18-Aug-1955   Reason for Encounter: General Adherence Call    Recent office visits:  02/22/22 Connie Brome MD.Seen for Chest Pain. Started on Ablify '2mg'$  daily. D/C Benzonatate '100mg'$  and Clonazepam.  Recent consult visits:  None  Hospital visits:  None  Medications: Outpatient Encounter Medications as of 03/12/2022  Medication Sig   Abaloparatide (TYMLOS) 3120 MCG/1.56ML SOPN Inject 80 mcg into the skin daily.   albuterol (VENTOLIN HFA) 108 (90 Base) MCG/ACT inhaler Inhale 2 puffs into the lungs every 4 (four) hours as needed.   ARIPiprazole (ABILIFY) 2 MG tablet Take 1 tablet (2 mg total) by mouth daily.   atorvastatin (LIPITOR) 40 MG tablet Take 1 tablet (40 mg total) by mouth daily.   Azelastine HCl 137 MCG/SPRAY SOLN Place 2 sprays into the nose in the morning and at bedtime.   bethanechol (URECHOLINE) 25 MG tablet Take 1 tablet (25 mg total) by mouth in the morning and at bedtime.   busPIRone (BUSPAR) 30 MG tablet TAKE 1 TABLET BY MOUTH TWICE A DAY   cyclobenzaprine (FLEXERIL) 10 MG tablet TAKE 1 TABLET BY MOUTH 3  TIMES DAILY   DULoxetine (CYMBALTA) 60 MG capsule TAKE 1 CAPSULE BY MOUTH  DAILY   fenofibrate 160 MG tablet TAKE 1 TABLET BY MOUTH EVERY DAY   fluticasone (FLONASE) 50 MCG/ACT nasal spray Place 2 sprays into both nostrils daily.   gabapentin (NEURONTIN) 600 MG tablet TAKE 1 TABLET BY MOUTH 4 TIMES  DAILY   hydrocortisone (CORTEF) 10 MG tablet Take 10 mg by mouth 2 (two) times daily.   hydrOXYzine (VISTARIL) 25 MG capsule TAKE 1 CAPSULE (25 MG TOTAL) BY MOUTH EVERY 8 (EIGHT) HOURS AS NEEDED FOR ANXIETY OR ITCHING.   Insulin Pen Needle (GLOBAL EASY GLIDE PEN NEEDLES) 32G X 4 MM MISC Use one needle daily as directed   levocetirizine (XYZAL) 5 MG tablet TAKE 1 TABLET BY MOUTH EVERY DAY IN THE EVENING   levothyroxine (SYNTHROID) 75 MCG tablet TAKE 1 TABLET BY MOUTH EVERY DAY BEFORE  BREAKFAST   lubiprostone (AMITIZA) 8 MCG capsule TAKE 1 CAPSULE BY MOUTH TWICE  DAILY WITH MEALS   montelukast (SINGULAIR) 10 MG tablet TAKE 1 TABLET BY MOUTH  DAILY   Multiple Vitamin (MULTI-VITAMINS) TABS Take 1 tablet by mouth daily.   nitrofurantoin (MACRODANTIN) 100 MG capsule Take 100 mg by mouth daily.   olopatadine (PATANOL) 0.1 % ophthalmic solution Place 1 drop into both eyes daily as needed for allergies.   omeprazole (PRILOSEC) 40 MG capsule TAKE 1 CAPSULE BY MOUTH  TWICE DAILY   ondansetron (ZOFRAN) 4 MG tablet Take 1 tablet (4 mg total) by mouth every 8 (eight) hours as needed for nausea or vomiting.   oxyCODONE-acetaminophen (PERCOCET) 10-325 MG tablet TAKE 1 TAB EVERY 4 HOURS FOR 5 DAYS THEN 1 TAB TWICE A DAY AS NEEDED FOR SEVERE BREAKTHROUGH PAIN   primidone (MYSOLINE) 50 MG tablet TAKE 1 TABLET BY MOUTH TWICE A DAY   Propylene Glycol (SYSTANE BALANCE) 0.6 % SOLN Place 1 drop into both eyes daily as needed (dry eyes).   SPIRIVA RESPIMAT 2.5 MCG/ACT AERS USE 1 INHALATION BY MOUTH  TWICE DAILY   Vitamin D, Ergocalciferol, (DRISDOL) 50000 units CAPS capsule Take 50,000 Units by mouth every Wednesday.   No facility-administered encounter medications on file as of 03/12/2022.    Contacted Connie Coffey for General Review Call   Chart  Review:  Have there been any documented new, changed, or discontinued medications since last visit? Yes  Has there been any documented recent hospitalizations or ED visits since last visit with Clinical Pharmacist? No Brief Summary (including medication and/or Diagnosis changes):   Adherence Review:  Does the Clinical Pharmacist Assistant have access to adherence rates? Yes Adherence rates for STAR metric medications (List medication(s)/day supply/ last 2 fill dates).Atorvastatin 03/06/22 - 12/06/21 100ds  Adherence rates for medications indicated for disease state being reviewed (List medication(s)/day supply/ last 2 fill dates). Does the  patient have >5 day gap between last estimated fill dates for any of the above medications or other medication gaps? No Reason for medication gaps.   Disease State Questions:  Able to connect with Patient? No   Connie Coffey, [facility] Catering manager  [phone]

## 2022-03-13 ENCOUNTER — Other Ambulatory Visit: Payer: Self-pay | Admitting: Family Medicine

## 2022-03-15 ENCOUNTER — Encounter: Payer: Self-pay | Admitting: Family Medicine

## 2022-03-16 ENCOUNTER — Other Ambulatory Visit: Payer: Self-pay | Admitting: Family Medicine

## 2022-03-16 DIAGNOSIS — F332 Major depressive disorder, recurrent severe without psychotic features: Secondary | ICD-10-CM

## 2022-03-16 DIAGNOSIS — F411 Generalized anxiety disorder: Secondary | ICD-10-CM

## 2022-03-18 NOTE — Progress Notes (Unsigned)
Subjective:  Patient ID: Connie Coffey, female    DOB: 1956/03/04  Age: 66 y.o. MRN: [id number]  Chief Complaint  Patient presents with   Hyperlipidemia   Hypothyroidism    HPI Hyperlipidemia: Taking Atorvastatin 40 mg daily, fenofibrate 160 mg everyday.  Hypothyroidism: Takes Levothyroxine 75 mcg daily.  Depression/anxiety: Continue duloxetine 60 mg daily, buspirone 30 mg twice daily, hydroxyzine 25 mg three times a day, and on abilify 2 mg once daily. Abilify started it about 3 weeks ago. Anxiety improved, but still depressed. Has a new kitten.   GERD: worsened. Belching. Omeprazole 40 mg twice daily. Pepcid 20 mg 2 daily. Ondansetron 4 mg three times a day as needed nausea and vomiting. Has never been on any other PPIs.   COPD: on spiriva respimat one inhaltion twice daily and albuterol 2 puffs four times a day as needed.   Current Outpatient Medications on File Prior to Visit  Medication Sig Dispense Refill   Abaloparatide (TYMLOS) 3120 MCG/1.56ML SOPN Inject 80 mcg into the skin daily. 4.68 mL 1   albuterol (VENTOLIN HFA) 108 (90 Base) MCG/ACT inhaler Inhale 2 puffs into the lungs every 4 (four) hours as needed. 18 g 2   atorvastatin (LIPITOR) 40 MG tablet Take 1 tablet (40 mg total) by mouth daily. 90 tablet 3   bethanechol (URECHOLINE) 25 MG tablet Take 1 tablet (25 mg total) by mouth in the morning and at bedtime. 180 tablet 1   busPIRone (BUSPAR) 30 MG tablet TAKE 1 TABLET BY MOUTH TWICE A DAY 180 tablet 1   cyclobenzaprine (FLEXERIL) 10 MG tablet TAKE 1 TABLET BY MOUTH 3  TIMES DAILY 270 tablet 1   DULoxetine (CYMBALTA) 60 MG capsule TAKE 1 CAPSULE BY MOUTH  DAILY 90 capsule 3   fenofibrate 160 MG tablet TAKE 1 TABLET BY MOUTH EVERY DAY 90 tablet 1   gabapentin (NEURONTIN) 600 MG tablet TAKE 1 TABLET BY MOUTH 4 TIMES  DAILY 400 tablet 2   hydrocortisone (CORTEF) 10 MG tablet Take 10 mg by mouth 2 (two) times daily.     hydrOXYzine (VISTARIL) 25 MG capsule TAKE 1 CAPSULE (25  MG TOTAL) BY MOUTH EVERY 8 (EIGHT) HOURS AS NEEDED FOR ANXIETY OR ITCHING. 270 capsule 1   Insulin Pen Needle (GLOBAL EASY GLIDE PEN NEEDLES) 32G X 4 MM MISC Use one needle daily as directed     levocetirizine (XYZAL) 5 MG tablet TAKE 1 TABLET BY MOUTH EVERY DAY IN THE EVENING 90 tablet 3   levothyroxine (SYNTHROID) 75 MCG tablet TAKE 1 TABLET BY MOUTH EVERY DAY BEFORE BREAKFAST 90 tablet 0   lubiprostone (AMITIZA) 8 MCG capsule TAKE 1 CAPSULE BY MOUTH TWICE  DAILY WITH MEALS 180 capsule 3   montelukast (SINGULAIR) 10 MG tablet TAKE 1 TABLET BY MOUTH  DAILY 90 tablet 3   Multiple Vitamin (MULTI-VITAMINS) TABS Take 1 tablet by mouth daily.     nitrofurantoin (MACRODANTIN) 100 MG capsule Take 100 mg by mouth daily.     olopatadine (PATANOL) 0.1 % ophthalmic solution Place 1 drop into both eyes daily as needed for allergies.     oxyCODONE-acetaminophen (PERCOCET) 10-325 MG tablet TAKE 1 TAB EVERY 4 HOURS FOR 5 DAYS THEN 1 TAB TWICE A DAY AS NEEDED FOR SEVERE BREAKTHROUGH PAIN  0   primidone (MYSOLINE) 50 MG tablet TAKE 1 TABLET BY MOUTH TWICE A DAY 180 tablet 1   Propylene Glycol (SYSTANE BALANCE) 0.6 % SOLN Place 1 drop into both eyes daily as  needed (dry eyes).     SPIRIVA RESPIMAT 2.5 MCG/ACT AERS USE 1 INHALATION BY MOUTH  TWICE DAILY 12 g 3   Vitamin D, Ergocalciferol, (DRISDOL) 50000 units CAPS capsule Take 50,000 Units by mouth every Wednesday.     No current facility-administered medications on file prior to visit.   Past Medical History:  Diagnosis Date   Adrenal insufficiency (HCC)    Anxiety    Arthritis    Carpal tunnel syndrome    Both hands, worse in right   Chronic constipation    COPD (chronic obstructive pulmonary disease) (HCC)    CVA (cerebral vascular accident) ([place])    Depression    Facet syndrome    Fibromyalgia    GERD (gastroesophageal reflux disease)    Heart murmur    Hypercholesteremia    IBS (irritable bowel syndrome)    Ischemic stroke ([place])    07/19/2004    Lumbar radiculopathy    Osteoporosis    Radiculopathy, lumbar region 05/02/2021   RLS (restless legs syndrome)    Spinal stenosis    TMJ (temporomandibular joint disorder)    Trochanteric bursitis of both hips    Vascular malformation    spinal cord   Past Surgical History:  Procedure Laterality Date   APPENDECTOMY     CARPAL TUNNEL RELEASE Right 12/13/2017   surgery   CHOLECYSTECTOMY     COLONOSCOPY  05/06/2017   Colonic polyps status post polypectomy. Interan land external hemorrhoids.    ESOPHAGOGASTRODUODENOSCOPY  04/28/2007   Mild gastrtiis. Otherwise normal EGD.    HEMORROIDECTOMY     LAMINECTOMY AND MICRODISCECTOMY LUMBAR SPINE  01/03/2005   NECK SURGERY     x2    OTHER SURGICAL HISTORY  04/24/2004   anterior cervical discectomy and fusion at C5-C6   SPINAL CORD STIMULATOR INSERTION N/A 07/21/2021   Procedure: Permanant Spinal cord stimulator placement leads and battery under fluroscopy;  Surgeon: Reece Agar, MD;  Location: [facility];  Service: Neurosurgery;  Laterality: N/A;   TONSILLECTOMY     TOTAL ABDOMINAL HYSTERECTOMY      Family History  Problem Relation Age of Onset   Supraventricular tachycardia Mother    Dementia Mother    Hyperlipidemia Mother    Hypertension Mother    Osteoarthritis Mother    Osteoporosis Mother    Breast cancer Other    Breast cancer Other    Colon cancer Neg Hx    Esophageal cancer Neg Hx    Social History   Socioeconomic History   Marital status: Divorced    Spouse name: Not on file   Number of children: 1   Years of education: Not on file   Highest education level: Not on file  Occupational History   Occupation: Disabled  Tobacco Use   Smoking status: Every Day    Packs/day: 1.00    Types: Cigarettes   Smokeless tobacco: Never  Vaping Use   Vaping Use: Never used  Substance and Sexual Activity   Alcohol use: Not Currently    Comment: quit 19 years   Drug use: Never   Sexual activity: Not Currently  Other  Topics Concern   Not on file  Social History Narrative   Disabled son lives with her sometimes and his father sometimes.  Evita is working hard to have a relationship with her son's father and his wife.  They help her when needed.   Social Determinants of Health   Financial Resource Strain: Low Risk  (11/15/2021)  Overall Financial Resource Strain (CARDIA)    Difficulty of Paying Living Expenses: Not hard at all  Food Insecurity: No Food Insecurity (02/14/2022)   Hunger Vital Sign    Worried About Running Out of Food in the Last Year: Never true    Ran Out of Food in the Last Year: Never true  Transportation Needs: No Transportation Needs (02/14/2022)   PRAPARE - Hydrologist (Medical): No    Lack of Transportation (Non-Medical): No  Physical Activity: Inactive (11/15/2021)   Exercise Vital Sign    Days of Exercise per Week: 0 days    Minutes of Exercise per Session: 0 min  Stress: Stress Concern Present (06/30/2019)   [place]    Feeling of Stress : Very much  Social Connections: Not on file    Review of Systems  Constitutional:  Positive for diaphoresis. Negative for chills, fatigue and fever.  HENT:  Negative for congestion, ear pain (right) and sore throat.   Respiratory:  Negative for cough and shortness of breath.   Cardiovascular:  Negative for chest pain.  Gastrointestinal:  Positive for abdominal pain and nausea. Negative for constipation, diarrhea and vomiting.       Reflux is bad. Belching.  Endocrine: Positive for polydipsia (dry mouth) and polyuria. Negative for polyphagia.  Genitourinary:  Positive for urgency. Negative for dysuria.  Musculoskeletal:  Positive for back pain. Negative for arthralgias and myalgias.  Skin:  Negative for rash.  Neurological:  Negative for dizziness and headaches.  Psychiatric/Behavioral:  Positive for dysphoric mood. Negative for sleep  disturbance and suicidal ideas. The patient is nervous/anxious.      Objective:  BP 124/82 (BP Location: Left Arm, Patient Position: Sitting)   Pulse 94   Temp 97.8 F (36.6 C) (Temporal)   Ht '5\' 3"'$  (1.6 m)   Wt 132 lb (59.9 kg)   SpO2 94%   BMI 23.38 kg/m      03/19/2022   10:03 AM 02/22/2022    8:47 AM 01/26/2022    8:45 AM  BP/Weight  Systolic BP 063 016 010  Diastolic BP 82 60 68  Wt. (Lbs) 132 133.4 140.4  BMI 23.38 kg/m2 23.63 kg/m2 24.87 kg/m2    Physical Exam Vitals reviewed.  Constitutional:      General: She is not in acute distress.    Appearance: Normal appearance. She is normal weight.  HENT:     Right Ear: Tympanic membrane and ear canal normal.     Left Ear: Tympanic membrane and ear canal normal.     Nose: Nose normal. No congestion or rhinorrhea.     Mouth/Throat:     Pharynx: No oropharyngeal exudate or posterior oropharyngeal erythema.  Neck:     Thyroid: No thyroid mass.     Vascular: No carotid bruit.  Cardiovascular:     Rate and Rhythm: Normal rate and regular rhythm.     Heart sounds: Murmur heard.  Pulmonary:     Effort: Pulmonary effort is normal.     Breath sounds: Normal breath sounds.  Abdominal:     General: Bowel sounds are normal.     Palpations: Abdomen is soft. There is no mass.     Tenderness: There is abdominal tenderness (epigastric.).  Neurological:     Mental Status: She is alert and oriented to person, place, and time.     Cranial Nerves: No cranial nerve deficit.  Psychiatric:  Mood and Affect: Mood normal.        Behavior: Behavior normal.     Diabetic Foot Exam - Simple   No data filed      Lab Results  Component Value Date   WBC 13.5 (H) 03/19/2022   HGB 14.1 03/19/2022   HCT 41.2 03/19/2022   PLT 514 (H) 03/19/2022   GLUCOSE 101 (H) 01/23/2022   CHOL 165 12/08/2021   TRIG 124 12/08/2021   HDL 77 12/08/2021   LDLCALC 67 12/08/2021   ALT 11 01/23/2022   AST 14 01/23/2022   NA 145 (H) 01/23/2022    K 4.0 01/23/2022   CL 103 01/23/2022   CREATININE 0.78 01/23/2022   BUN 16 01/23/2022   CO2 28 01/23/2022   TSH 0.896 01/23/2022      Assessment & Plan:   Problem List Items Addressed This Visit       Respiratory   Allergic rhinitis due to allergen    Change Flonase and azelastine to Dymista.      Relevant Medications   Azelastine-Fluticasone 137-50 MCG/ACT SUSP     Digestive   GERD (gastroesophageal reflux disease) - Primary    Change omeprazole to pantoprazole 40 mg twice daily      Relevant Medications   pantoprazole (PROTONIX) 40 MG tablet   ondansetron (ZOFRAN) 4 MG tablet   Other Relevant Orders   CBC with Differential/Platelet (Completed)     Musculoskeletal and Integument   Onychomycosis of toenail    Refer to podiatry      Relevant Orders   Ambulatory referral to Podiatry   Pain around toenail    Refer to podiatry      Relevant Orders   Ambulatory referral to Podiatry     Other   Moderate recurrent major depression ([place])    Increase Abilify to 5 mg daily.      Relevant Medications   ARIPiprazole (ABILIFY) 5 MG tablet   Vitamin D deficiency    Check labs.      GAD (generalized anxiety disorder)    Increase abilify to 5 mg daily. Continue other psychiatric medications.      Mixed hyperlipidemia    Well controlled.  No changes to medicines. Taking Atorvastatin 40 mg daily, fenofibrate 160 mg everyday. Continue to work on eating a healthy diet and exercise.         Murmur    Order echocardiogram      Relevant Orders   ECHOCARDIOGRAM COMPLETE   Other Visit Diagnoses     Need for immunization against influenza       Relevant Orders   Flu Vaccine QUAD High Dose(Fluad) (Completed)   Encounter for immunization       Relevant Orders   Pfizer Fall 2023 Covid-19 Vaccine 46yr and older (Completed)     .  Meds ordered this encounter  Medications   pantoprazole (PROTONIX) 40 MG tablet    Sig: Take 1 tablet (40 mg total) by  mouth 2 (two) times daily.    Dispense:  60 tablet    Refill:  2   ondansetron (ZOFRAN) 4 MG tablet    Sig: Take 1 tablet (4 mg total) by mouth every 8 (eight) hours as needed for nausea or vomiting.    Dispense:  40 tablet    Refill:  2   Azelastine-Fluticasone 137-50 MCG/ACT SUSP    Sig: Place 1 spray into the nose every 12 (twelve) hours.    Dispense:  69 g  Refill:  3    90 day prescription.   ARIPiprazole (ABILIFY) 5 MG tablet    Sig: Take 1 tablet (5 mg total) by mouth daily.    Dispense:  30 tablet    Refill:  2    Orders Placed This Encounter  Procedures   Flu Vaccine QUAD High Dose(Fluad)   Pfizer Fall 2023 Covid-19 Vaccine 42yr and older   CBC with Differential/Platelet   Ambulatory referral to Podiatry   ECHOCARDIOGRAM COMPLETE    Total time spent on today's visit was greater than 30 minutes, including both face-to-face time and nonface-to-face time personally spent on review of chart (labs and imaging), discussing labs and goals, discussing further work-up, treatment options, referrals to specialist if needed, reviewing outside records of pertinent, answering patient's questions, and coordinating care.  Follow-up: Return in about 3 months (around 06/19/2022) for chronic follow up.  I,Jalila Goodnough,acting as a sEducation administratorfor KRochel Brome MD.,have documented all relevant documentation on the behalf of KRochel Brome MD,as directed by  KRochel Brome MD while in the presence of KRochel Brome MD.   An After Visit Summary was printed and given to the patient.  KRochel Brome MD [facility] [phone]

## 2022-03-19 ENCOUNTER — Encounter: Payer: Self-pay | Admitting: Family Medicine

## 2022-03-19 ENCOUNTER — Ambulatory Visit (INDEPENDENT_AMBULATORY_CARE_PROVIDER_SITE_OTHER): Payer: 59 | Admitting: Family Medicine

## 2022-03-19 VITALS — BP 124/82 | HR 94 | Temp 97.8°F | Ht 63.0 in | Wt 132.0 lb

## 2022-03-19 DIAGNOSIS — E782 Mixed hyperlipidemia: Secondary | ICD-10-CM

## 2022-03-19 DIAGNOSIS — F331 Major depressive disorder, recurrent, moderate: Secondary | ICD-10-CM | POA: Diagnosis not present

## 2022-03-19 DIAGNOSIS — J3089 Other allergic rhinitis: Secondary | ICD-10-CM

## 2022-03-19 DIAGNOSIS — F411 Generalized anxiety disorder: Secondary | ICD-10-CM | POA: Diagnosis not present

## 2022-03-19 DIAGNOSIS — K219 Gastro-esophageal reflux disease without esophagitis: Secondary | ICD-10-CM

## 2022-03-19 DIAGNOSIS — G894 Chronic pain syndrome: Secondary | ICD-10-CM

## 2022-03-19 DIAGNOSIS — J309 Allergic rhinitis, unspecified: Secondary | ICD-10-CM | POA: Insufficient documentation

## 2022-03-19 DIAGNOSIS — Z23 Encounter for immunization: Secondary | ICD-10-CM | POA: Diagnosis not present

## 2022-03-19 DIAGNOSIS — M79676 Pain in unspecified toe(s): Secondary | ICD-10-CM

## 2022-03-19 DIAGNOSIS — E559 Vitamin D deficiency, unspecified: Secondary | ICD-10-CM

## 2022-03-19 DIAGNOSIS — F330 Major depressive disorder, recurrent, mild: Secondary | ICD-10-CM

## 2022-03-19 DIAGNOSIS — B351 Tinea unguium: Secondary | ICD-10-CM | POA: Insufficient documentation

## 2022-03-19 DIAGNOSIS — R011 Cardiac murmur, unspecified: Secondary | ICD-10-CM

## 2022-03-19 DIAGNOSIS — E039 Hypothyroidism, unspecified: Secondary | ICD-10-CM

## 2022-03-19 LAB — CBC WITH DIFFERENTIAL/PLATELET
Basophils Absolute: 0.1 10*3/uL (ref 0.0–0.2)
Basos: 1 %
EOS (ABSOLUTE): 0.1 10*3/uL (ref 0.0–0.4)
Eos: 1 %
Hematocrit: 41.2 % (ref 34.0–46.6)
Hemoglobin: 14.1 g/dL (ref 11.1–15.9)
Immature Grans (Abs): 0 10*3/uL (ref 0.0–0.1)
Immature Granulocytes: 0 %
Lymphocytes Absolute: 1.9 10*3/uL (ref 0.7–3.1)
Lymphs: 14 %
MCH: 29 pg (ref 26.6–33.0)
MCHC: 34.2 g/dL (ref 31.5–35.7)
MCV: 85 fL (ref 79–97)
Monocytes Absolute: 0.8 10*3/uL (ref 0.1–0.9)
Monocytes: 6 %
Neutrophils Absolute: 10.6 10*3/uL — ABNORMAL HIGH (ref 1.4–7.0)
Neutrophils: 78 %
Platelets: 514 10*3/uL — ABNORMAL HIGH (ref 150–450)
RBC: 4.87 x10E6/uL (ref 3.77–5.28)
RDW: 13.4 % (ref 11.7–15.4)
WBC: 13.5 10*3/uL — ABNORMAL HIGH (ref 3.4–10.8)

## 2022-03-19 MED ORDER — ONDANSETRON HCL 4 MG PO TABS: 4.0000 mg | ORAL_TABLET | Freq: Three times a day (TID) | ORAL | 2 refills | Status: DC | PRN

## 2022-03-19 MED ORDER — AZELASTINE-FLUTICASONE 137-50 MCG/ACT NA SUSP: 1.0000 | Freq: Two times a day (BID) | NASAL | 3 refills | Status: DC

## 2022-03-19 MED ORDER — PANTOPRAZOLE SODIUM 40 MG PO TBEC: 40.0000 mg | DELAYED_RELEASE_TABLET | Freq: Two times a day (BID) | ORAL | 2 refills | Status: DC

## 2022-03-19 MED ORDER — ARIPIPRAZOLE 5 MG PO TABS: 5.0000 mg | ORAL_TABLET | Freq: Every day | ORAL | 2 refills | Status: DC

## 2022-03-19 NOTE — Assessment & Plan Note (Signed)
Increase Abilify to 5 mg daily.

## 2022-03-19 NOTE — Assessment & Plan Note (Signed)
Change Flonase and azelastine to Dymista.

## 2022-03-19 NOTE — Assessment & Plan Note (Addendum)
Well controlled.  No changes to medicines. Taking Atorvastatin 40 mg daily, fenofibrate 160 mg everyday. Continue to work on eating a healthy diet and exercise.  

## 2022-03-19 NOTE — Patient Instructions (Addendum)
Change omeprazole to Protonix 40 mg twice daily for heartburn.  Depression: Increase Abilify to 5 mg daily  Change Flonase and azelastine to Dymista inhaler.  Ordering echocardiogram for heart murmur.  Referring to podiatry for abnormal right great toe nail

## 2022-03-19 NOTE — Assessment & Plan Note (Signed)
Change omeprazole to pantoprazole 40 mg twice daily

## 2022-03-19 NOTE — Assessment & Plan Note (Signed)
Order echocardiogram

## 2022-03-19 NOTE — Assessment & Plan Note (Signed)
Increase abilify to 5 mg daily. Continue other psychiatric medications.

## 2022-03-19 NOTE — Assessment & Plan Note (Signed)
Refer to podiatry

## 2022-03-23 ENCOUNTER — Telehealth: Payer: Self-pay

## 2022-03-23 ENCOUNTER — Other Ambulatory Visit: Payer: Self-pay | Admitting: Family Medicine

## 2022-03-23 MED ORDER — AZITHROMYCIN 250 MG PO TABS: ORAL_TABLET | ORAL | 0 refills | Status: AC

## 2022-03-23 NOTE — Telephone Encounter (Signed)
Patient called and stated that she was seen on Monday and provider was going to send her in a antibiotic in for her, it has not been called in, she now cough up green stuff. Please advise.

## 2022-03-23 NOTE — Telephone Encounter (Signed)
Patient was informed.

## 2022-03-24 NOTE — Assessment & Plan Note (Signed)
Check labs 

## 2022-03-28 ENCOUNTER — Telehealth: Payer: Self-pay

## 2022-03-28 NOTE — Progress Notes (Signed)
Chronic Care Management Pharmacy Assistant   Name: Connie Coffey  MRN: [id number] DOB: 12-Dec-1955   Reason for Encounter: General Adherence Call    Recent office visits:  03/19/22 Connie Brome MD. Seen for routine visit. Referral to Podiatry. Started on Flonase nasal spray, Pantoprazole Sodium '40mg'$ . Increased Abilify to '5mg'$  daily. Change omeprazole to Protonix 40 mg twice daily. Change Flonase and azelastine to Dymista inhaler.   Recent consult visits:  03/27/22 (Neurosurgery) Connie Coffey, [name]. Seen for back pain. No med changes.   Hospital visits:  None  Medications: Outpatient Encounter Medications as of 03/28/2022  Medication Sig   Abaloparatide (TYMLOS) 3120 MCG/1.56ML SOPN Inject 80 mcg into the skin daily.   albuterol (VENTOLIN HFA) 108 (90 Base) MCG/ACT inhaler Inhale 2 puffs into the lungs every 4 (four) hours as needed.   ARIPiprazole (ABILIFY) 5 MG tablet Take 1 tablet (5 mg total) by mouth daily.   atorvastatin (LIPITOR) 40 MG tablet Take 1 tablet (40 mg total) by mouth daily.   Azelastine-Fluticasone 137-50 MCG/ACT SUSP Place 1 spray into the nose every 12 (twelve) hours.   azithromycin (ZITHROMAX) 250 MG tablet Take 2 tablets on day 1, then 1 tablet daily on days 2 through 5   bethanechol (URECHOLINE) 25 MG tablet Take 1 tablet (25 mg total) by mouth in the morning and at bedtime.   busPIRone (BUSPAR) 30 MG tablet TAKE 1 TABLET BY MOUTH TWICE A DAY   cyclobenzaprine (FLEXERIL) 10 MG tablet TAKE 1 TABLET BY MOUTH 3  TIMES DAILY   DULoxetine (CYMBALTA) 60 MG capsule TAKE 1 CAPSULE BY MOUTH  DAILY   fenofibrate 160 MG tablet TAKE 1 TABLET BY MOUTH EVERY DAY   gabapentin (NEURONTIN) 600 MG tablet TAKE 1 TABLET BY MOUTH 4 TIMES  DAILY   hydrocortisone (CORTEF) 10 MG tablet Take 10 mg by mouth 2 (two) times daily.   hydrOXYzine (VISTARIL) 25 MG capsule TAKE 1 CAPSULE (25 MG TOTAL) BY MOUTH EVERY 8 (EIGHT) HOURS AS NEEDED FOR ANXIETY OR ITCHING.   Insulin Pen Needle (GLOBAL EASY  GLIDE PEN NEEDLES) 32G X 4 MM MISC Use one needle daily as directed   levocetirizine (XYZAL) 5 MG tablet TAKE 1 TABLET BY MOUTH EVERY DAY IN THE EVENING   levothyroxine (SYNTHROID) 75 MCG tablet TAKE 1 TABLET BY MOUTH EVERY DAY BEFORE BREAKFAST   lubiprostone (AMITIZA) 8 MCG capsule TAKE 1 CAPSULE BY MOUTH TWICE  DAILY WITH MEALS   montelukast (SINGULAIR) 10 MG tablet TAKE 1 TABLET BY MOUTH  DAILY   Multiple Vitamin (MULTI-VITAMINS) TABS Take 1 tablet by mouth daily.   nitrofurantoin (MACRODANTIN) 100 MG capsule Take 100 mg by mouth daily.   olopatadine (PATANOL) 0.1 % ophthalmic solution Place 1 drop into both eyes daily as needed for allergies.   ondansetron (ZOFRAN) 4 MG tablet Take 1 tablet (4 mg total) by mouth every 8 (eight) hours as needed for nausea or vomiting.   oxyCODONE-acetaminophen (PERCOCET) 10-325 MG tablet TAKE 1 TAB EVERY 4 HOURS FOR 5 DAYS THEN 1 TAB TWICE A DAY AS NEEDED FOR SEVERE BREAKTHROUGH PAIN   pantoprazole (PROTONIX) 40 MG tablet Take 1 tablet (40 mg total) by mouth 2 (two) times daily.   primidone (MYSOLINE) 50 MG tablet TAKE 1 TABLET BY MOUTH TWICE A DAY   Propylene Glycol (SYSTANE BALANCE) 0.6 % SOLN Place 1 drop into both eyes daily as needed (dry eyes).   SPIRIVA RESPIMAT 2.5 MCG/ACT AERS USE 1 INHALATION BY MOUTH  TWICE DAILY  Vitamin D, Ergocalciferol, (DRISDOL) 50000 units CAPS capsule Take 50,000 Units by mouth every Wednesday.   No facility-administered encounter medications on file as of 03/28/2022.    [place] for General Review Call   Chart Review:  Have there been any documented new, changed, or discontinued medications since last visit? Yes  Has there been any documented recent hospitalizations or ED visits since last visit with Clinical Pharmacist? No Brief Summary (including medication and/or Diagnosis changes):   Adherence Review:  Does the Clinical Pharmacist Assistant have access to adherence rates? Yes Adherence rates for  STAR metric medications (List medication(s)/day supply/ last 2 fill dates). Atorvastatin 03/06/22 - 12/06/21 100ds   Adherence rates for medications indicated for disease state being reviewed (List medication(s)/day supply/ last 2 fill dates). Does the patient have >5 day gap between last estimated fill dates for any of the above medications or other medication gaps? No Reason for medication gaps.   Disease State Questions:  Able to connect with Patient? No, unable to reach pt to complete this call    [name], [place] Pharmacist Assistant  [phone]

## 2022-03-30 ENCOUNTER — Other Ambulatory Visit: Payer: Self-pay | Admitting: Physician Assistant

## 2022-04-02 ENCOUNTER — Ambulatory Visit (INDEPENDENT_AMBULATORY_CARE_PROVIDER_SITE_OTHER): Payer: 59 | Admitting: Podiatry

## 2022-04-02 DIAGNOSIS — B351 Tinea unguium: Secondary | ICD-10-CM | POA: Diagnosis not present

## 2022-04-02 DIAGNOSIS — M7741 Metatarsalgia, right foot: Secondary | ICD-10-CM | POA: Diagnosis not present

## 2022-04-02 DIAGNOSIS — I739 Peripheral vascular disease, unspecified: Secondary | ICD-10-CM | POA: Diagnosis not present

## 2022-04-02 DIAGNOSIS — M7742 Metatarsalgia, left foot: Secondary | ICD-10-CM

## 2022-04-02 MED ORDER — CICLOPIROX 8 % EX SOLN: Freq: Every day | CUTANEOUS | 0 refills | Status: DC

## 2022-04-02 NOTE — Progress Notes (Signed)
Subjective:  Patient ID: Connie Coffey, female    DOB: 11/29/1955,  MRN: [id number]  Chief Complaint  Patient presents with   Nail Problem    bil toenails discolored/ some are black.    Foot Pain    Pain to bilateral ball of feet. Tingling sensation.     66 y.o. female presents with concern for hallux nails being discolored and abnormally growing.  She also has some pain to the bilateral ball of the foot and tingling sensation of these locations.  She denies a history of diabetes.  She does take gabapentin.  Past Medical History:  Diagnosis Date   Adrenal insufficiency (HCC)    Anxiety    Arthritis    Carpal tunnel syndrome    Both hands, worse in right   Chronic constipation    COPD (chronic obstructive pulmonary disease) (HCC)    CVA (cerebral vascular accident) ([place])    Depression    Facet syndrome    Fibromyalgia    GERD (gastroesophageal reflux disease)    Heart murmur    Hypercholesteremia    IBS (irritable bowel syndrome)    Ischemic stroke ([place])    07/19/2004   Lumbar radiculopathy    Osteoporosis    Radiculopathy, lumbar region 05/02/2021   RLS (restless legs syndrome)    Spinal stenosis    TMJ (temporomandibular joint disorder)    Trochanteric bursitis of both hips    Vascular malformation    spinal cord    Allergies  Allergen Reactions   Beta Vulgaris Anaphylaxis    Beets    Penicillins Shortness Of Breath   Celebrex [Celecoxib]     "because of stomach ulcers"   Chantix [Varenicline]     Makes pt sick on her stomach    Dog Epithelium Allergy Skin Test     Cats and dogs   Fish Oil Other (See Comments)    hemorrhoids   Gramineae Pollens    Molds & Smuts    Nsaids Other (See Comments)    GI Upset. Ulcers   Other     beets   Nicotrol [Nicotine] Nausea Only    ROS: Negative except as per HPI above  Objective:  General: AAO x3, NAD  Dermatological: Nails thickened gated dystrophic and abnormally growing x5 both feet.  Hallux nails are worse  with increased thickness and discoloration.  Vascular:  Dorsalis Pedis artery and Posterior Tibial artery pedal pulses are 1/4 bilateral.  Capillary fill time < 3 sec to all digits.   Neruologic: Grossly intact via light touch bilateral. Protective threshold intact to all sites bilateral.   Musculoskeletal: Pain with palpation about the plantar aspect of the metatarsal heads bilateral foot.  Diminished fat pad is noted bilaterally.  Gait: Unassisted, Nonantalgic.   No images are attached to the encounter.   Assessment:   1. Onychomycosis of toenail   2. Metatarsalgia of both feet   3. PAD (peripheral artery disease) ([place])      Plan:  Patient was evaluated and treated and all questions answered.  Onychomycosis -Educated on etiology of nail fungus. -eRx for penlac 8% solution sent to pharamacy -Recommend daily topical application of ciclopirox 8% solution to all nails for the next 3 to 6 months for nail fungal infection.  Metatarsalgia Right foot -Discussed conservative options including extra-depth shoes gel cushion shoes inserts and anti-inflammatory medication - Recommend gel padding sleeve for right foot, dispensed to patient  PAD, smoking hx -Recommend bilateral ABI studies to rule out significant  arterial disease -Recommend we get a baseline and make sure that she is not having any ischemic rest pain related to arterial insufficiency.  Return in about 3 months (around 07/03/2022) for follow up nail fungal infection.          Everitt Amber, DPM [facility]

## 2022-04-04 ENCOUNTER — Ambulatory Visit: Payer: 59 | Attending: Cardiology

## 2022-04-04 DIAGNOSIS — R011 Cardiac murmur, unspecified: Secondary | ICD-10-CM | POA: Diagnosis not present

## 2022-04-04 LAB — ECHOCARDIOGRAM COMPLETE
Area-P 1/2: 3.48 cm2
MV M vel: 6.5 m/s
MV Peak grad: 169 mmHg
Radius: 0.6 cm
S' Lateral: 2.4 cm

## 2022-04-04 MED ORDER — PERFLUTREN LIPID MICROSPHERE
1.0000 mL | INTRAVENOUS | Status: AC | PRN
  Administered 2022-04-04: 4 mL via INTRAVENOUS

## 2022-04-10 ENCOUNTER — Other Ambulatory Visit: Payer: Self-pay | Admitting: Family Medicine

## 2022-04-10 DIAGNOSIS — F331 Major depressive disorder, recurrent, moderate: Secondary | ICD-10-CM

## 2022-04-16 ENCOUNTER — Ambulatory Visit: Payer: Self-pay

## 2022-04-16 NOTE — Patient Outreach (Signed)
  Care Coordination   Follow Up Visit Note   04/16/2022 Name: Connie Coffey MRN: [id number] DOB: 1955/09/26  Connie Coffey is a 66 y.o. year old female who sees Cox, Kirsten, MD for primary care. I spoke with  Connie Coffey by phone today.  What matters to the patients health and wellness today?  Patient reports that she woke up with neck pain 4 days ago. She thinks it is a muscle. She has been taking her normal muscle relaxer's.  She reports that she has also been using a heating pad. She reports today that she has pain that goes down her left arm. Reports this is new.  States she has had her back stimulator adjusted and her back pain has decreased.  Reports she continues to have decrease energy levels but states she is trying to be more active.    Goals Addressed               This Visit's Progress     Chronic back pain and lack of energy (pt-stated)        Care Coordination Interventions: Reassessed pain. Reviewed new pain in the neck area. Encouraged patient to call MD. Reviewed importance of healthy diet and staying active           SDOH assessments and interventions completed:  No     Care Coordination Interventions Activated:  Yes  Care Coordination Interventions:  Yes, provided   Follow up plan: Follow up call scheduled for 07/18/2022    Encounter Outcome:  Pt. Visit Completed   Tomasa Rand, RN, BSN, CEN [place] Coordinator [phone]

## 2022-04-30 ENCOUNTER — Ambulatory Visit: Payer: 59 | Attending: Podiatry

## 2022-04-30 DIAGNOSIS — I739 Peripheral vascular disease, unspecified: Secondary | ICD-10-CM

## 2022-05-08 ENCOUNTER — Ambulatory Visit (INDEPENDENT_AMBULATORY_CARE_PROVIDER_SITE_OTHER): Payer: 59 | Admitting: Nurse Practitioner

## 2022-05-08 ENCOUNTER — Encounter: Payer: Self-pay | Admitting: Nurse Practitioner

## 2022-05-08 VITALS — BP 100/72 | HR 89 | Temp 97.2°F | Resp 16 | Ht 60.0 in | Wt 134.0 lb

## 2022-05-08 DIAGNOSIS — M25571 Pain in right ankle and joints of right foot: Secondary | ICD-10-CM | POA: Diagnosis not present

## 2022-05-08 DIAGNOSIS — J0100 Acute maxillary sinusitis, unspecified: Secondary | ICD-10-CM | POA: Insufficient documentation

## 2022-05-08 MED ORDER — PROMETHAZINE-DM 6.25-15 MG/5ML PO SYRP: 5.0000 mL | ORAL_SOLUTION | Freq: Four times a day (QID) | ORAL | 0 refills | Status: DC | PRN

## 2022-05-08 MED ORDER — AZITHROMYCIN 250 MG PO TABS: ORAL_TABLET | ORAL | 0 refills | Status: AC

## 2022-05-08 NOTE — Assessment & Plan Note (Signed)
Minimal weight bearing for now, until ankle X-ray done Sent to [place] for ankle X-ray Suggested for ice and heat alternating Patient is taking pain medicines for other problems and that is covering this pain also Follow up with her X-ray

## 2022-05-08 NOTE — Progress Notes (Signed)
Acute Office Visit  Subjective:    Patient ID: Connie Coffey, female    DOB: 1955-06-20, 66 y.o.   MRN: [id number]  CHIEF COMPLAINT: Foot pain Sinus congestion  HPI: 1) Foot pain: patient said she tried to get up from a recliner last Sunday 48/05/6551 and heard clicking of bone sound from her right foot. Patient felt terrible pain and did not walk whole day. Denies swelling. Taking oxycodone, gabapentin, tylenol for other chronic health problems but thinks that medicines are helping to cover this pain also. Patient have a history of osteoporosis and taking Tymlos (Abaloparatide ) 80 mcg into the skin daily, prescribed by Dr Peri [facility]. Patient is asking for Dexa scan and last was done in 2021 with a osteoporosis findings. Patient can bear minimal weight this time,  has a history of ankle fracture. Patient is not using Ice and heat. She is worried since the severity of pain not decreased  2) Patient is complaining shortness of breath, wheezing and sinus pressure and congestion. Taking honey and water with minimal help. Following questions asked regarding her symptoms.  Fever: no Cough: yes Shortness of breath: yes Wheezing: yes Chest pain: yes, with cough Chest tightness: yes Chest congestion: yes Nasal congestion: yes Runny nose: no Post nasal drip: no Sneezing: no Sore throat: no Swollen glands: no Sinus pressure: yes Headache: no Face pain: no Toothache: no Ear pain: no    Ear pressure: no    Eyes red/itching:no Eye drainage/crusting: no  Vomiting: no Rash: no Fatigue: yes Sick contacts: no Strep contacts: no  Context: fluctuating Recurrent sinusitis: no Relief with OTC cold/cough medications:  no    Treatments attempted:   Review of Systems: See pertinent positives and negatives per HPI.   Past Medical History:  Diagnosis Date   Adrenal insufficiency (HCC)    Anxiety    Arthritis    Carpal tunnel syndrome    Both hands, worse  in right   Chronic constipation    COPD (chronic obstructive pulmonary disease) (HCC)    CVA (cerebral vascular accident) ([place])    Depression    Facet syndrome    Fibromyalgia    GERD (gastroesophageal reflux disease)    Heart murmur    Hypercholesteremia    IBS (irritable bowel syndrome)    Ischemic stroke ([place])    07/19/2004   Lumbar radiculopathy    Osteoporosis    Radiculopathy, lumbar region 05/02/2021   RLS (restless legs syndrome)    Spinal stenosis    TMJ (temporomandibular joint disorder)    Trochanteric bursitis of both hips    Vascular malformation    spinal cord    Past Surgical History:  Procedure Laterality Date   APPENDECTOMY     CARPAL TUNNEL RELEASE Right 12/13/2017   surgery   CHOLECYSTECTOMY     COLONOSCOPY  05/06/2017   Colonic polyps status post polypectomy. Interan land external hemorrhoids.    ESOPHAGOGASTRODUODENOSCOPY  04/28/2007   Mild gastrtiis. Otherwise normal EGD.    HEMORROIDECTOMY     LAMINECTOMY AND MICRODISCECTOMY LUMBAR SPINE  01/03/2005   NECK SURGERY     x2    OTHER SURGICAL HISTORY  04/24/2004   anterior cervical discectomy and fusion at C5-C6   SPINAL CORD STIMULATOR INSERTION N/A 07/21/2021   Procedure: Permanant Spinal cord stimulator placement leads and battery under fluroscopy;  Surgeon: Reece Agar, MD;  Location: [facility];  Service: Neurosurgery;  Laterality: N/A;   TONSILLECTOMY  TOTAL ABDOMINAL HYSTERECTOMY      Family History  Problem Relation Age of Onset   Supraventricular tachycardia Mother    Dementia Mother    Hyperlipidemia Mother    Hypertension Mother    Osteoarthritis Mother    Osteoporosis Mother    Breast cancer Other    Breast cancer Other    Colon cancer Neg Hx    Esophageal cancer Neg Hx     Social History   Socioeconomic History   Marital status: Divorced    Spouse name: Not on file   Number of children: 1   Years of education: Not on file   Highest education level: Not on file   Occupational History   Occupation: Disabled  Tobacco Use   Smoking status: Every Day    Packs/day: 1.00    Types: Cigarettes   Smokeless tobacco: Never  Vaping Use   Vaping Use: Never used  Substance and Sexual Activity   Alcohol use: Not Currently    Comment: quit 19 years   Drug use: Never   Sexual activity: Not Currently  Other Topics Concern   Not on file  Social History Narrative   Disabled son lives with her sometimes and his father sometimes.  Eleri is working hard to have a relationship with her son's father and his wife.  They help her when needed.   Social Determinants of Health   Financial Resource Strain: Low Risk  (11/15/2021)   Overall Financial Resource Strain (CARDIA)    Difficulty of Paying Living Expenses: Not hard at all  Food Insecurity: No Food Insecurity (02/14/2022)   Hunger Vital Sign    Worried About Running Out of Food in the Last Year: Never true    Ran Out of Food in the Last Year: Never true  Transportation Needs: No Transportation Needs (02/14/2022)   PRAPARE - Hydrologist (Medical): No    Lack of Transportation (Non-Medical): No  Physical Activity: Inactive (11/15/2021)   Exercise Vital Sign    Days of Exercise per Week: 0 days    Minutes of Exercise per Session: 0 min  Stress: Stress Concern Present (06/30/2019)   [place]    Feeling of Stress : Very much  Social Connections: Not on file  Intimate Partner Violence: Not At Risk (02/14/2022)   Humiliation, Afraid, Rape, and Kick questionnaire    Fear of Current or Ex-Partner: No    Emotionally Abused: No    Physically Abused: No    Sexually Abused: No    Outpatient Medications Prior to Visit  Medication Sig Dispense Refill   Abaloparatide (TYMLOS) 3120 MCG/1.56ML SOPN Inject 80 mcg into the skin daily. 4.68 mL 1   albuterol (VENTOLIN HFA) 108 (90 Base) MCG/ACT inhaler Inhale 2 puffs into the  lungs every 4 (four) hours as needed. 18 g 2   ARIPiprazole (ABILIFY) 5 MG tablet TAKE 1 TABLET (5 MG TOTAL) BY MOUTH DAILY. 90 tablet 1   atorvastatin (LIPITOR) 40 MG tablet Take 1 tablet (40 mg total) by mouth daily. 90 tablet 3   Azelastine-Fluticasone 137-50 MCG/ACT SUSP Place 1 spray into the nose every 12 (twelve) hours. 69 g 3   bethanechol (URECHOLINE) 25 MG tablet Take 1 tablet (25 mg total) by mouth in the morning and at bedtime. 180 tablet 1   busPIRone (BUSPAR) 30 MG tablet TAKE 1 TABLET BY MOUTH TWICE A DAY 180 tablet 1   ciclopirox (PENLAC)  8 % solution Apply topically at bedtime. Apply over nail and surrounding skin. Apply daily over previous coat. After seven (7) days, may remove with alcohol and continue cycle. 6.6 mL 0   cyclobenzaprine (FLEXERIL) 10 MG tablet TAKE 1 TABLET BY MOUTH 3  TIMES DAILY 270 tablet 1   DULoxetine (CYMBALTA) 60 MG capsule TAKE 1 CAPSULE BY MOUTH  DAILY 90 capsule 3   fenofibrate 160 MG tablet TAKE 1 TABLET BY MOUTH EVERY DAY 90 tablet 1   gabapentin (NEURONTIN) 600 MG tablet TAKE 1 TABLET BY MOUTH 4 TIMES  DAILY 400 tablet 2   hydrocortisone (CORTEF) 10 MG tablet Take 10 mg by mouth 2 (two) times daily.     hydrOXYzine (VISTARIL) 25 MG capsule TAKE 1 CAPSULE (25 MG TOTAL) BY MOUTH EVERY 8 (EIGHT) HOURS AS NEEDED FOR ANXIETY OR ITCHING. 270 capsule 1   Insulin Pen Needle (GLOBAL EASY GLIDE PEN NEEDLES) 32G X 4 MM MISC Use one needle daily as directed     levocetirizine (XYZAL) 5 MG tablet TAKE 1 TABLET BY MOUTH EVERY DAY IN THE EVENING 90 tablet 3   levothyroxine (SYNTHROID) 75 MCG tablet TAKE 1 TABLET BY MOUTH EVERY DAY BEFORE BREAKFAST 90 tablet 0   lubiprostone (AMITIZA) 8 MCG capsule TAKE 1 CAPSULE BY MOUTH TWICE  DAILY WITH MEALS 180 capsule 3   montelukast (SINGULAIR) 10 MG tablet TAKE 1 TABLET BY MOUTH  DAILY 90 tablet 3   Multiple Vitamin (MULTI-VITAMINS) TABS Take 1 tablet by mouth daily.     nitrofurantoin (MACRODANTIN) 100 MG capsule Take 100  mg by mouth daily.     olopatadine (PATANOL) 0.1 % ophthalmic solution Place 1 drop into both eyes daily as needed for allergies.     ondansetron (ZOFRAN) 4 MG tablet Take 1 tablet (4 mg total) by mouth every 8 (eight) hours as needed for nausea or vomiting. 40 tablet 2   oxyCODONE-acetaminophen (PERCOCET) 10-325 MG tablet TAKE 1 TAB EVERY 4 HOURS FOR 5 DAYS THEN 1 TAB TWICE A DAY AS NEEDED FOR SEVERE BREAKTHROUGH PAIN  0   pantoprazole (PROTONIX) 40 MG tablet Take 1 tablet (40 mg total) by mouth 2 (two) times daily. 60 tablet 2   primidone (MYSOLINE) 50 MG tablet TAKE 1 TABLET BY MOUTH TWICE A DAY 180 tablet 1   Propylene Glycol (SYSTANE BALANCE) 0.6 % SOLN Place 1 drop into both eyes daily as needed (dry eyes).     SPIRIVA RESPIMAT 2.5 MCG/ACT AERS USE 1 INHALATION BY MOUTH  TWICE DAILY 12 g 3   Vitamin D, Ergocalciferol, (DRISDOL) 50000 units CAPS capsule Take 50,000 Units by mouth every Wednesday.     No facility-administered medications prior to visit.    Allergies  Allergen Reactions   Beta Vulgaris Anaphylaxis    Beets    Penicillins Shortness Of Breath   Celebrex [Celecoxib]     "because of stomach ulcers"   Chantix [Varenicline]     Makes pt sick on her stomach    Dog Epithelium Allergy Skin Test     Cats and dogs   Fish Oil Other (See Comments)    hemorrhoids   Gramineae Pollens    Molds & Smuts    Nsaids Other (See Comments)    GI Upset. Ulcers   Other     beets   Nicotrol [Nicotine] Nausea Only       Objective:     Physical Exam HENT:     Right Ear: Tympanic membrane normal.  Left Ear: Tympanic membrane normal.     Nose: Congestion and rhinorrhea present.     Right Sinus: Maxillary sinus tenderness present. No frontal sinus tenderness.     Left Sinus: Maxillary sinus tenderness present. No frontal sinus tenderness.     Mouth/Throat:     Mouth: Mucous membranes are moist.  Cardiovascular:     Rate and Rhythm: Normal rate and regular rhythm.     Pulses:  Normal pulses.     Heart sounds: Murmur (chronic murmur) heard.  Pulmonary:     Effort: Pulmonary effort is normal.     Breath sounds: Normal breath sounds.  Abdominal:     Palpations: Abdomen is soft.  Musculoskeletal:        General: Tenderness (right ankle tenderness , no signs of warmth and deformity from outside) present. No swelling, deformity or signs of injury.     Right lower leg: No edema.     Left lower leg: No edema.  Neurological:     Mental Status: She is alert and oriented to person, place, and time.  Psychiatric:        Mood and Affect: Mood normal.        Behavior: Behavior normal.        Thought Content: Thought content normal.        Judgment: Judgment normal.    BP 100/72   Pulse 89   Temp (!) 97.2 F (36.2 C)   Resp 16   Ht 5' (1.524 m)   Wt 134 lb (60.8 kg)   SpO2 95%   BMI 26.17 kg/m  Wt Readings from Last 3 Encounters:  05/08/22 134 lb (60.8 kg)  03/19/22 132 lb (59.9 kg)  02/22/22 133 lb 6.4 oz (60.5 kg)    Health Maintenance Due  Topic Date Due   Zoster Vaccines- Shingrix (1 of 2) Never done   Medicare Annual Wellness (AWV)  02/06/2022    There are no preventive care reminders to display for this patient.   Lab Results  Component Value Date   TSH 0.896 01/23/2022   Lab Results  Component Value Date   WBC 13.5 (H) 03/19/2022   HGB 14.1 03/19/2022   HCT 41.2 03/19/2022   MCV 85 03/19/2022   PLT 514 (H) 03/19/2022   Lab Results  Component Value Date   NA 145 (H) 01/23/2022   K 4.0 01/23/2022   CO2 28 01/23/2022   GLUCOSE 101 (H) 01/23/2022   BUN 16 01/23/2022   CREATININE 0.78 01/23/2022   BILITOT 0.2 01/23/2022   ALKPHOS 49 01/23/2022   AST 14 01/23/2022   ALT 11 01/23/2022   PROT 6.4 01/23/2022   ALBUMIN 4.4 01/23/2022   CALCIUM 10.2 01/23/2022   ANIONGAP 8 07/21/2021   EGFR 83 01/23/2022   Lab Results  Component Value Date   CHOL 165 12/08/2021   Lab Results  Component Value Date   HDL 77 12/08/2021   Lab  Results  Component Value Date   LDLCALC 67 12/08/2021   Lab Results  Component Value Date   TRIG 124 12/08/2021   Lab Results  Component Value Date   CHOLHDL 2.1 12/08/2021      Assessment & Plan:   Problem List Items Addressed This Visit       Respiratory   Acute non-recurrent maxillary sinusitis - Primary    Take antibiotics as prescribed Use Flonase nasal spray daily Take Promethazine-DM up to 4 times daily for cough/sinus congestion Drink plenty of fluids Follow-up  as needed       Relevant Medications   azithromycin (ZITHROMAX) 250 MG tablet   promethazine-dextromethorphan (PROMETHAZINE-DM) 6.25-15 MG/5ML syrup     Other   Acute right ankle pain    Minimal weight bearing for now, until ankle X-ray done Sent to [place] for ankle X-ray Suggested for ice and heat alternating Patient is taking pain medicines for other problems and that is covering this pain also Follow up with her X-ray        Follow-up:  as needed.     I, Neil Crouch have reviewed all documentation for this visit. The documentation on 05/08/22   for the exam, diagnosis, procedures, and orders are all accurate and complete.     An After Visit Summary was printed and given to the patient.  Neil Crouch, DNP, [place] [phone]

## 2022-05-08 NOTE — Patient Instructions (Addendum)
Please go to the [facility] for the ankle X-ray  Take antibiotics as prescribed Use Flonase nasal spray daily Take Promethazine-DM up to 4 times daily for cough/sinus congestion Drink plenty of fluids Follow-up as needed   Sinus Infection, Adult A sinus infection is soreness and swelling (inflammation) of your sinuses. Sinuses are hollow spaces in the bones around your face. They are located: Around your eyes. In the middle of your forehead. Behind your nose. In your cheekbones. Your sinuses and nasal passages are lined with a fluid called mucus. Mucus drains out of your sinuses. Swelling can trap mucus in your sinuses. This lets germs (bacteria, virus, or fungus) grow, which leads to infection. Most of the time, this condition is caused by a virus. What are the causes? Allergies. Asthma. Germs. Things that block your nose or sinuses. Growths in the nose (nasal polyps). Chemicals or irritants in the air. A fungus. This is rare. What increases the risk? Having a weak body defense system (immune system). Doing a lot of swimming or diving. Using nasal sprays too much. Smoking. What are the signs or symptoms? The main symptoms of this condition are pain and a feeling of pressure around the sinuses. Other symptoms include: Stuffy nose (congestion). This may make it hard to breathe through your nose. Runny nose (drainage). Soreness, swelling, and warmth in the sinuses. A cough that may get worse at night. Being unable to smell and taste. Mucus that collects in the throat or the back of the nose (postnasal drip). This may cause a sore throat or bad breath. Being very tired (fatigued). A fever. How is this diagnosed? Your symptoms. Your medical history. A physical exam. Tests to find out if your condition is short-term (acute) or Shuart-term (chronic). Your doctor may: Check your nose for growths (polyps). Check your sinuses using a tool that has a light on one end  (endoscope). Check for allergies or germs. Do imaging tests, such as an MRI or CT scan. How is this treated? Treatment for this condition depends on the cause and whether it is short-term or Cavness-term. If caused by a virus, your symptoms should go away on their own within 10 days. You may be given medicines to relieve symptoms. They include: Medicines that shrink swollen tissue in the nose. A spray that treats swelling of the nostrils. Rinses that help get rid of thick mucus in your nose (nasal saline washes). Medicines that treat allergies (antihistamines). Over-the-counter pain relievers. If caused by bacteria, your doctor may wait to see if you will get better without treatment. You may be given antibiotic medicine if you have: A very bad infection. A weak body defense system. If caused by growths in the nose, surgery may be needed. Follow these instructions at home: Medicines Take, use, or apply over-the-counter and prescription medicines only as told by your doctor. These may include nasal sprays. If you were prescribed an antibiotic medicine, take it as told by your doctor. Do not stop taking it even if you start to feel better. Hydrate and humidify  Drink enough water to keep your pee (urine) pale yellow. Use a cool mist humidifier to keep the humidity level in your home above 50%. Breathe in steam for 10-15 minutes, 3-4 times a day, or as told by your doctor. You can do this in the bathroom while a hot shower is running. Try not to spend time in cool or dry air. Rest Rest as much as you can. Sleep with your head raised (  elevated). Make sure you get enough sleep each night. General instructions  Put a warm, moist washcloth on your face 3-4 times a day, or as often as told by your doctor. Use nasal saline washes as often as told by your doctor. Wash your hands often with soap and water. If you cannot use soap and water, use hand sanitizer. Do not smoke. Avoid being around  people who are smoking (secondhand smoke). Keep all follow-up visits. Contact a doctor if: You have a fever. Your symptoms get worse. Your symptoms do not get better within 10 days. Get help right away if: You have a very bad headache. You cannot stop vomiting. You have very bad pain or swelling around your face or eyes. You have trouble seeing. You feel confused. Your neck is stiff. You have trouble breathing. These symptoms may be an emergency. Get help right away. Call 911. Do not wait to see if the symptoms will go away. Do not drive yourself to the hospital. Summary A sinus infection is swelling of your sinuses. Sinuses are hollow spaces in the bones around your face. This condition is caused by tissues in your nose that become inflamed or swollen. This traps germs. These can lead to infection. If you were prescribed an antibiotic medicine, take it as told by your doctor. Do not stop taking it even if you start to feel better. Keep all follow-up visits. This information is not intended to replace advice given to you by your health care provider. Make sure you discuss any questions you have with your health care provider. Document Revised: 04/18/2021 Document Reviewed: 04/18/2021 Elsevier Patient Education  [place].

## 2022-05-08 NOTE — Assessment & Plan Note (Signed)
Take antibiotics as prescribed Use Flonase nasal spray daily Take Promethazine-DM up to 4 times daily for cough/sinus congestion Drink plenty of fluids Follow-up as needed  

## 2022-05-10 ENCOUNTER — Telehealth: Payer: Self-pay

## 2022-05-10 ENCOUNTER — Encounter: Payer: Self-pay | Admitting: Family Medicine

## 2022-05-10 NOTE — Progress Notes (Signed)
Chronic Care Management Pharmacy Assistant   Name: TERRION POBLANO  MRN: [id number] DOB: [birth year]   Reason for Encounter: General Adherence Call    Recent office visits:  05/08/22 Neil Crouch FNP. Seen for foot injury. Started on Azithromycin '250mg'$  and Promethazine DM.   Recent consult visits:  04/02/22 (Podiatry) [facility]. Seen for Onychomycosis. Started on Cidopirox 8%.   Hospital visits:  None  Medications: Outpatient Encounter Medications as of 05/10/2022  Medication Sig   Abaloparatide (TYMLOS) 3120 MCG/1.56ML SOPN Inject 80 mcg into the skin daily.   albuterol (VENTOLIN HFA) 108 (90 Base) MCG/ACT inhaler Inhale 2 puffs into the lungs every 4 (four) hours as needed.   ARIPiprazole (ABILIFY) 5 MG tablet TAKE 1 TABLET (5 MG TOTAL) BY MOUTH DAILY.   atorvastatin (LIPITOR) 40 MG tablet Take 1 tablet (40 mg total) by mouth daily.   Azelastine-Fluticasone 137-50 MCG/ACT SUSP Place 1 spray into the nose every 12 (twelve) hours.   azithromycin (ZITHROMAX) 250 MG tablet Take 2 tablets on day 1, then 1 tablet daily on days 2 through 5   bethanechol (URECHOLINE) 25 MG tablet Take 1 tablet (25 mg total) by mouth in the morning and at bedtime.   busPIRone (BUSPAR) 30 MG tablet TAKE 1 TABLET BY MOUTH TWICE A DAY   ciclopirox (PENLAC) 8 % solution Apply topically at bedtime. Apply over nail and surrounding skin. Apply daily over previous coat. After seven (7) days, may remove with alcohol and continue cycle.   cyclobenzaprine (FLEXERIL) 10 MG tablet TAKE 1 TABLET BY MOUTH 3  TIMES DAILY   DULoxetine (CYMBALTA) 60 MG capsule TAKE 1 CAPSULE BY MOUTH  DAILY   fenofibrate 160 MG tablet TAKE 1 TABLET BY MOUTH EVERY DAY   gabapentin (NEURONTIN) 600 MG tablet TAKE 1 TABLET BY MOUTH 4 TIMES  DAILY   hydrocortisone (CORTEF) 10 MG tablet Take 10 mg by mouth 2 (two) times daily.   hydrOXYzine (VISTARIL) 25 MG capsule TAKE 1 CAPSULE (25 MG TOTAL) BY MOUTH EVERY 8 (EIGHT) HOURS AS  NEEDED FOR ANXIETY OR ITCHING.   Insulin Pen Needle (GLOBAL EASY GLIDE PEN NEEDLES) 32G X 4 MM MISC Use one needle daily as directed   levocetirizine (XYZAL) 5 MG tablet TAKE 1 TABLET BY MOUTH EVERY DAY IN THE EVENING   levothyroxine (SYNTHROID) 75 MCG tablet TAKE 1 TABLET BY MOUTH EVERY DAY BEFORE BREAKFAST   lubiprostone (AMITIZA) 8 MCG capsule TAKE 1 CAPSULE BY MOUTH TWICE  DAILY WITH MEALS   montelukast (SINGULAIR) 10 MG tablet TAKE 1 TABLET BY MOUTH  DAILY   Multiple Vitamin (MULTI-VITAMINS) TABS Take 1 tablet by mouth daily.   nitrofurantoin (MACRODANTIN) 100 MG capsule Take 100 mg by mouth daily.   olopatadine (PATANOL) 0.1 % ophthalmic solution Place 1 drop into both eyes daily as needed for allergies.   ondansetron (ZOFRAN) 4 MG tablet Take 1 tablet (4 mg total) by mouth every 8 (eight) hours as needed for nausea or vomiting.   oxyCODONE-acetaminophen (PERCOCET) 10-325 MG tablet TAKE 1 TAB EVERY 4 HOURS FOR 5 DAYS THEN 1 TAB TWICE A DAY AS NEEDED FOR SEVERE BREAKTHROUGH PAIN   pantoprazole (PROTONIX) 40 MG tablet Take 1 tablet (40 mg total) by mouth 2 (two) times daily.   primidone (MYSOLINE) 50 MG tablet TAKE 1 TABLET BY MOUTH TWICE A DAY   promethazine-dextromethorphan (PROMETHAZINE-DM) 6.25-15 MG/5ML syrup Take 5 mLs by mouth 4 (four) times daily as needed for cough.   Propylene Glycol (SYSTANE  BALANCE) 0.6 % SOLN Place 1 drop into both eyes daily as needed (dry eyes).   SPIRIVA RESPIMAT 2.5 MCG/ACT AERS USE 1 INHALATION BY MOUTH  TWICE DAILY   Vitamin D, Ergocalciferol, (DRISDOL) 50000 units CAPS capsule Take 50,000 Units by mouth every Wednesday.   No facility-administered encounter medications on file as of 05/10/2022.    [place] for General Review Call   Chart Review:  Have there been any documented new, changed, or discontinued medications since last visit? No  Has there been any documented recent hospitalizations or ED visits since last visit with Clinical  Pharmacist? No Brief Summary (including medication and/or Diagnosis changes):   Adherence Review:  Does the Clinical Pharmacist Assistant have access to adherence rates? Yes Adherence rates for STAR metric medications (List medication(s)/day supply/ last 2 fill dates).Atorvastatin 03/06/22 - 12/06/21 100ds    Adherence rates for medications indicated for disease state being reviewed (List medication(s)/day supply/ last 2 fill dates). Does the patient have >5 day gap between last estimated fill dates for any of the above medications or other medication gaps? No Reason for medication gaps. Do you receive your medications through PAP?   If Yes, what is the status? Last received?   Disease State Questions:  Able to connect with Patient? No   [name], [facility] Catering manager  [phone]

## 2022-05-19 ENCOUNTER — Other Ambulatory Visit: Payer: Self-pay | Admitting: Family Medicine

## 2022-06-06 ENCOUNTER — Other Ambulatory Visit: Payer: Self-pay | Admitting: Family Medicine

## 2022-06-06 ENCOUNTER — Telehealth: Payer: Self-pay

## 2022-06-06 DIAGNOSIS — J0100 Acute maxillary sinusitis, unspecified: Secondary | ICD-10-CM

## 2022-06-06 MED ORDER — PROMETHAZINE-DM 6.25-15 MG/5ML PO SYRP: 5.0000 mL | ORAL_SOLUTION | Freq: Four times a day (QID) | ORAL | 0 refills | Status: DC | PRN

## 2022-06-06 NOTE — Telephone Encounter (Signed)
I left detailed message on voicemail. ?

## 2022-06-06 NOTE — Telephone Encounter (Signed)
Patient called in to see if she can get her cough medicine refilled?

## 2022-06-14 DIAGNOSIS — Z981 Arthrodesis status: Secondary | ICD-10-CM | POA: Diagnosis not present

## 2022-06-14 DIAGNOSIS — M5416 Radiculopathy, lumbar region: Secondary | ICD-10-CM | POA: Diagnosis not present

## 2022-06-14 DIAGNOSIS — Z9689 Presence of other specified functional implants: Secondary | ICD-10-CM | POA: Diagnosis not present

## 2022-06-14 DIAGNOSIS — M961 Postlaminectomy syndrome, not elsewhere classified: Secondary | ICD-10-CM | POA: Diagnosis not present

## 2022-06-14 DIAGNOSIS — G894 Chronic pain syndrome: Secondary | ICD-10-CM | POA: Diagnosis not present

## 2022-06-25 ENCOUNTER — Other Ambulatory Visit: Payer: Self-pay | Admitting: Physician Assistant

## 2022-06-25 ENCOUNTER — Other Ambulatory Visit: Payer: Self-pay | Admitting: Family Medicine

## 2022-06-25 DIAGNOSIS — E782 Mixed hyperlipidemia: Secondary | ICD-10-CM

## 2022-06-27 DIAGNOSIS — H25813 Combined forms of age-related cataract, bilateral: Secondary | ICD-10-CM | POA: Diagnosis not present

## 2022-06-27 NOTE — Progress Notes (Unsigned)
Subjective:  Patient ID: Connie Coffey, female    DOB: Oct 10, 1955  Age: 67 y.o. MRN: [id number]  Chief Complaint  Patient presents with   Hyperlipidemia   Hypothyroidism   Anxiety   Depression   Gastroesophageal Reflux    HPI:   Hyperlipidemia: Taking Atorvastatin 40 mg daily, fenofibrate 160 mg everyday.   Hypothyroidism: Takes Levothyroxine 75 mcg daily.   Depression/anxiety: Continue duloxetine 60 mg daily, buspirone 30 mg twice daily, hydroxyzine 25 mg three times a day, and on abilify 2 mg once daily. Abilify started it about 3 weeks ago. Anxiety improved, but still depressed. Has a new kitten.    GERD: worsened. Belching. Pantoprazole 40 mg twice daily. Pepcid 20 mg 2 daily. Ondansetron 4 mg three times a day as needed nausea and vomiting. Has never been on any other PPIs.    COPD: on spiriva respimat one inhaltion twice daily and albuterol 2 puffs four times a day as needed.      03/19/2022    9:57 AM 03/19/2022    9:42 AM 02/14/2022   11:03 AM 12/08/2021    9:29 AM 05/08/2021   10:39 AM  Depression screen PHQ 2/9  Decreased Interest 1 1 0 0 0  Down, Depressed, Hopeless '1 1 1 1 '$ 0  PHQ - 2 Score '2 2 1 1 '$ 0  Altered sleeping '1 1  1   '$ Tired, decreased energy '3 3  1   '$ Change in appetite 0 1  2   Feeling bad or failure about yourself  1 1  0   Trouble concentrating 0 1  0   Moving slowly or fidgety/restless 1 1  0   Suicidal thoughts 0 1  0   PHQ-9 Score '8 11  5   '$ Difficult doing work/chores Very difficult Very difficult  Not difficult at all          04/25/2021    2:12 PM 05/08/2021   10:39 AM 07/21/2021    6:12 AM 11/03/2021    1:42 PM 02/14/2022   11:02 AM  Fall Risk  Falls in the past year?  1   0  Was there an injury with Fall?  0   0  Fall Risk Category Calculator  2   0  Fall Risk Category (Retired)  Moderate   Low  (RETIRED) Patient Fall Risk Level Low fall risk  Moderate fall risk Low fall risk   Patient at Risk for Falls Due to  History of fall(s)          Review of Systems  Constitutional:  Negative for chills, fatigue and fever.  HENT:  Negative for congestion, ear pain, rhinorrhea and sore throat.   Respiratory:  Negative for cough and shortness of breath.   Cardiovascular:  Negative for chest pain.  Gastrointestinal:  Negative for abdominal pain, constipation, diarrhea, nausea and vomiting.  Genitourinary:  Negative for dysuria and urgency.  Musculoskeletal:  Negative for back pain and myalgias.  Neurological:  Negative for dizziness, weakness, light-headedness and headaches.  Psychiatric/Behavioral:  Negative for dysphoric mood. The patient is not nervous/anxious.     Current Outpatient Medications on File Prior to Visit  Medication Sig Dispense Refill   Abaloparatide (TYMLOS) 3120 MCG/1.56ML SOPN Inject 80 mcg into the skin daily. 4.68 mL 1   albuterol (VENTOLIN HFA) 108 (90 Base) MCG/ACT inhaler Inhale 2 puffs into the lungs every 4 (four) hours as needed. 18 g 2   ARIPiprazole (ABILIFY) 5 MG tablet TAKE 1  TABLET (5 MG TOTAL) BY MOUTH DAILY. 90 tablet 1   atorvastatin (LIPITOR) 40 MG tablet Take 1 tablet (40 mg total) by mouth daily. 90 tablet 3   Azelastine-Fluticasone 137-50 MCG/ACT SUSP Place 1 spray into the nose every 12 (twelve) hours. 69 g 3   bethanechol (URECHOLINE) 25 MG tablet Take 1 tablet (25 mg total) by mouth in the morning and at bedtime. 180 tablet 1   busPIRone (BUSPAR) 30 MG tablet TAKE 1 TABLET BY MOUTH TWICE A DAY 180 tablet 1   ciclopirox (PENLAC) 8 % solution Apply topically at bedtime. Apply over nail and surrounding skin. Apply daily over previous coat. After seven (7) days, may remove with alcohol and continue cycle. 6.6 mL 0   cyclobenzaprine (FLEXERIL) 10 MG tablet TAKE 1 TABLET BY MOUTH 3  TIMES DAILY 270 tablet 1   DULoxetine (CYMBALTA) 60 MG capsule TAKE 1 CAPSULE BY MOUTH  DAILY 90 capsule 3   fenofibrate 160 MG tablet TAKE 1 TABLET BY MOUTH EVERY DAY 90 tablet 0   gabapentin (NEURONTIN) 600 MG  tablet TAKE 1 TABLET BY MOUTH 4 TIMES  DAILY 400 tablet 2   hydrocortisone (CORTEF) 10 MG tablet Take 10 mg by mouth 2 (two) times daily.     hydrOXYzine (VISTARIL) 25 MG capsule TAKE 1 CAPSULE (25 MG TOTAL) BY MOUTH EVERY 8 (EIGHT) HOURS AS NEEDED FOR ANXIETY OR ITCHING. 270 capsule 0   Insulin Pen Needle (GLOBAL EASY GLIDE PEN NEEDLES) 32G X 4 MM MISC Use one needle daily as directed     levocetirizine (XYZAL) 5 MG tablet TAKE 1 TABLET BY MOUTH EVERY DAY IN THE EVENING 90 tablet 3   levothyroxine (SYNTHROID) 75 MCG tablet TAKE 1 TABLET BY MOUTH EVERY DAY BEFORE BREAKFAST 90 tablet 0   lubiprostone (AMITIZA) 8 MCG capsule TAKE 1 CAPSULE BY MOUTH TWICE  DAILY WITH MEALS 180 capsule 3   montelukast (SINGULAIR) 10 MG tablet TAKE 1 TABLET BY MOUTH  DAILY 90 tablet 3   Multiple Vitamin (MULTI-VITAMINS) TABS Take 1 tablet by mouth daily.     nitrofurantoin (MACRODANTIN) 100 MG capsule Take 100 mg by mouth daily.     olopatadine (PATANOL) 0.1 % ophthalmic solution Place 1 drop into both eyes daily as needed for allergies.     ondansetron (ZOFRAN) 4 MG tablet Take 1 tablet (4 mg total) by mouth every 8 (eight) hours as needed for nausea or vomiting. 40 tablet 2   oxyCODONE-acetaminophen (PERCOCET) 10-325 MG tablet TAKE 1 TAB EVERY 4 HOURS FOR 5 DAYS THEN 1 TAB TWICE A DAY AS NEEDED FOR SEVERE BREAKTHROUGH PAIN  0   pantoprazole (PROTONIX) 40 MG tablet Take 1 tablet (40 mg total) by mouth 2 (two) times daily. 60 tablet 2   primidone (MYSOLINE) 50 MG tablet TAKE 1 TABLET BY MOUTH TWICE A DAY 180 tablet 1   promethazine-dextromethorphan (PROMETHAZINE-DM) 6.25-15 MG/5ML syrup Take 5 mLs by mouth 4 (four) times daily as needed for cough. 118 mL 0   Propylene Glycol (SYSTANE BALANCE) 0.6 % SOLN Place 1 drop into both eyes daily as needed (dry eyes).     SPIRIVA RESPIMAT 2.5 MCG/ACT AERS USE 1 INHALATION BY MOUTH  TWICE DAILY 12 g 3   Vitamin D, Ergocalciferol, (DRISDOL) 50000 units CAPS capsule Take 50,000  Units by mouth every Wednesday.     No current facility-administered medications on file prior to visit.   Past Medical History:  Diagnosis Date   Adrenal insufficiency ([place])  Anxiety    Arthritis    Carpal tunnel syndrome    Both hands, worse in right   Chronic constipation    COPD (chronic obstructive pulmonary disease) (HCC)    CVA (cerebral vascular accident) ([place])    Depression    Facet syndrome    Fibromyalgia    GERD (gastroesophageal reflux disease)    Heart murmur    Hypercholesteremia    IBS (irritable bowel syndrome)    Ischemic stroke ([place])    07/19/2004   Lumbar radiculopathy    Osteoporosis    Radiculopathy, lumbar region 05/02/2021   RLS (restless legs syndrome)    Spinal stenosis    TMJ (temporomandibular joint disorder)    Trochanteric bursitis of both hips    Vascular malformation    spinal cord   Past Surgical History:  Procedure Laterality Date   APPENDECTOMY     CARPAL TUNNEL RELEASE Right 12/13/2017   surgery   CHOLECYSTECTOMY     COLONOSCOPY  05/06/2017   Colonic polyps status post polypectomy. Interan land external hemorrhoids.    ESOPHAGOGASTRODUODENOSCOPY  04/28/2007   Mild gastrtiis. Otherwise normal EGD.    HEMORROIDECTOMY     LAMINECTOMY AND MICRODISCECTOMY LUMBAR SPINE  01/03/2005   NECK SURGERY     x2    OTHER SURGICAL HISTORY  04/24/2004   anterior cervical discectomy and fusion at C5-C6   SPINAL CORD STIMULATOR INSERTION N/A 07/21/2021   Procedure: Permanant Spinal cord stimulator placement leads and battery under fluroscopy;  Surgeon: Reece Agar, MD;  Location: [facility];  Service: Neurosurgery;  Laterality: N/A;   TONSILLECTOMY     TOTAL ABDOMINAL HYSTERECTOMY      Family History  Problem Relation Age of Onset   Supraventricular tachycardia Mother    Dementia Mother    Hyperlipidemia Mother    Hypertension Mother    Osteoarthritis Mother    Osteoporosis Mother    Breast cancer Other    Breast cancer Other    Colon  cancer Neg Hx    Esophageal cancer Neg Hx    Social History   Socioeconomic History   Marital status: Divorced    Spouse name: Not on file   Number of children: 1   Years of education: Not on file   Highest education level: Not on file  Occupational History   Occupation: Disabled  Tobacco Use   Smoking status: Every Day    Packs/day: 1.00    Types: Cigarettes   Smokeless tobacco: Never  Vaping Use   Vaping Use: Never used  Substance and Sexual Activity   Alcohol use: Not Currently    Comment: quit 19 years   Drug use: Never   Sexual activity: Not Currently  Other Topics Concern   Not on file  Social History Narrative   Disabled son lives with her sometimes and his father sometimes.  Demiana is working hard to have a relationship with her son's father and his wife.  They help her when needed.   Social Determinants of Health   Financial Resource Strain: Low Risk  (11/15/2021)   Overall Financial Resource Strain (CARDIA)    Difficulty of Paying Living Expenses: Not hard at all  Food Insecurity: No Food Insecurity (02/14/2022)   Hunger Vital Sign    Worried About Running Out of Food in the Last Year: Never true    Ran Out of Food in the Last Year: Never true  Transportation Needs: No Transportation Needs (02/14/2022)   PRAPARE - Transportation  Lack of Transportation (Medical): No    Lack of Transportation (Non-Medical): No  Physical Activity: Inactive (11/15/2021)   Exercise Vital Sign    Days of Exercise per Week: 0 days    Minutes of Exercise per Session: 0 min  Stress: Stress Concern Present (06/30/2019)   [place]    Feeling of Stress : Very much  Social Connections: Not on file    Objective:  There were no vitals taken for this visit.     05/08/2022    2:46 PM 03/19/2022   10:03 AM 02/22/2022    8:47 AM  BP/Weight  Systolic BP 601 093 235  Diastolic BP 72 82 60  Wt. (Lbs) 134 132 133.4  BMI  26.17 kg/m2 23.38 kg/m2 23.63 kg/m2    Physical Exam  Diabetic Foot Exam - Simple   No data filed      Lab Results  Component Value Date   WBC 13.5 (H) 03/19/2022   HGB 14.1 03/19/2022   HCT 41.2 03/19/2022   PLT 514 (H) 03/19/2022   GLUCOSE 101 (H) 01/23/2022   CHOL 165 12/08/2021   TRIG 124 12/08/2021   HDL 77 12/08/2021   LDLCALC 67 12/08/2021   ALT 11 01/23/2022   AST 14 01/23/2022   NA 145 (H) 01/23/2022   K 4.0 01/23/2022   CL 103 01/23/2022   CREATININE 0.78 01/23/2022   BUN 16 01/23/2022   CO2 28 01/23/2022   TSH 0.896 01/23/2022      Assessment & Plan:    Gastroesophageal reflux disease without esophagitis Assessment & Plan: Continue pantoprazole 40 mg twice daily  and Pepcid 20 mg BID.   Moderate recurrent major depression (HCC)  Vitamin D deficiency  GAD (generalized anxiety disorder)  Mixed hyperlipidemia  Acquired hypothyroidism Assessment & Plan: Previously well controlled Continue Synthroid at current dose       No orders of the defined types were placed in this encounter.   No orders of the defined types were placed in this encounter.    Follow-up: No follow-ups on file.  An After Visit Summary was printed and given to the patient.  [name], MD [facility] [phone]

## 2022-06-27 NOTE — Assessment & Plan Note (Signed)
Previously well controlled Continue Synthroid at current dose  

## 2022-06-27 NOTE — Assessment & Plan Note (Signed)
Continue pantoprazole 40 mg twice daily  and Pepcid 20 mg BID.

## 2022-06-28 ENCOUNTER — Encounter: Payer: 59 | Admitting: Family Medicine

## 2022-06-28 DIAGNOSIS — E039 Hypothyroidism, unspecified: Secondary | ICD-10-CM

## 2022-06-28 DIAGNOSIS — F331 Major depressive disorder, recurrent, moderate: Secondary | ICD-10-CM

## 2022-06-28 DIAGNOSIS — E559 Vitamin D deficiency, unspecified: Secondary | ICD-10-CM

## 2022-06-28 DIAGNOSIS — K219 Gastro-esophageal reflux disease without esophagitis: Secondary | ICD-10-CM

## 2022-06-28 DIAGNOSIS — E782 Mixed hyperlipidemia: Secondary | ICD-10-CM

## 2022-06-28 DIAGNOSIS — F411 Generalized anxiety disorder: Secondary | ICD-10-CM

## 2022-07-01 NOTE — Progress Notes (Signed)
This encounter was created in error - please disregard.

## 2022-07-03 ENCOUNTER — Ambulatory Visit (INDEPENDENT_AMBULATORY_CARE_PROVIDER_SITE_OTHER): Payer: 59 | Admitting: Podiatry

## 2022-07-03 DIAGNOSIS — M5386 Other specified dorsopathies, lumbar region: Secondary | ICD-10-CM

## 2022-07-03 DIAGNOSIS — M792 Neuralgia and neuritis, unspecified: Secondary | ICD-10-CM | POA: Diagnosis not present

## 2022-07-03 DIAGNOSIS — L600 Ingrowing nail: Secondary | ICD-10-CM

## 2022-07-03 DIAGNOSIS — B351 Tinea unguium: Secondary | ICD-10-CM | POA: Diagnosis not present

## 2022-07-03 NOTE — Progress Notes (Signed)
Subjective:  Patient ID: Connie Coffey, female    DOB: [birth year],  MRN: [id number]  Chief Complaint  Patient presents with   Follow-up    nail fungal infection    67 y.o. female presents with concern for hallux nails being discolored and abnormally growing.  She states that the right hallux nail is improving after using Penlac 8% solution topically applied nightly to the nail.  She denies any pain with the area.  She previously had a ingrown on the medial border which she has taken care of that herself and is not having pain there at this time.  Past Medical History:  Diagnosis Date   Adrenal insufficiency (HCC)    Anxiety    Arthritis    Carpal tunnel syndrome    Both hands, worse in right   Chronic constipation    COPD (chronic obstructive pulmonary disease) (HCC)    CVA (cerebral vascular accident) ([place])    Depression    Facet syndrome    Fibromyalgia    GERD (gastroesophageal reflux disease)    Heart murmur    Hypercholesteremia    IBS (irritable bowel syndrome)    Ischemic stroke ([place])    07/19/2004   Lumbar radiculopathy    Osteoporosis    Radiculopathy, lumbar region 05/02/2021   RLS (restless legs syndrome)    Spinal stenosis    TMJ (temporomandibular joint disorder)    Trochanteric bursitis of both hips    Vascular malformation    spinal cord    Allergies  Allergen Reactions   Beta Vulgaris Anaphylaxis    Beets    Penicillins Shortness Of Breath   Celebrex [Celecoxib]     "because of stomach ulcers"   Chantix [Varenicline]     Makes pt sick on her stomach    Dog Epithelium Allergy Skin Test     Cats and dogs   Fish Oil Other (See Comments)    hemorrhoids   Gramineae Pollens    Molds & Smuts    Nsaids Other (See Comments)    GI Upset. Ulcers   Other     beets   Nicotrol [Nicotine] Nausea Only    ROS: Negative except as per HPI above  Objective:  General: AAO x3, NAD  Dermatological: Right hallux nail with thickening discoloration of the  proximal nail base appears slightly clearer than prior.  No pain on palpation of the nail.  Mild inflammation on the medial border the right hallux nail but no significant pain on palpation.  Vascular:  Dorsalis Pedis artery and Posterior Tibial artery pedal pulses are 1/4 bilateral.  Capillary fill time < 3 sec to all digits.   Neruologic: Grossly intact via light touch bilateral. Protective threshold intact to all sites bilateral.   Musculoskeletal: Pain with palpation about the plantar aspect of the metatarsal heads bilateral foot.  Diminished fat pad is noted bilaterally.  Gait: Unassisted, Nonantalgic.   No images are attached to the encounter.   Assessment:   1. Onychomycosis of toenail   2. Neuritis   3. Sciatica of right side associated with disorder of lumbar spine   4. Ingrown nail of great toe of right foot       Plan:  Patient was evaluated and treated and all questions answered.  Onychomycosis -Educated on etiology of nail fungus. -Continue with topical antifungal as desired for the next 3 months.  Recommend continuation 8% topical Penlac solution.   # Prior ingrown nail right hallux medial border -Patient has treated  this on her own by trimming out the ingrown border with aggressive slant back -Discussed procedure would be phenol and alcohol matrixectomy as needed to remove the ingrown in the future.  # Sciatica right lower extremity -Continue treatment per her spinal surgeon and neurology gabapentin as needed  PAD, smoking hx -Reviewed ABI results which demonstrated normal ABI and TBI as well as waveforms of the bilateral lower extremity  Return in about 4 months (around 11/01/2022) for R hallux nail fungal inection .          Everitt Amber, DPM [facility]

## 2022-07-09 ENCOUNTER — Other Ambulatory Visit: Payer: Self-pay | Admitting: Family Medicine

## 2022-07-09 DIAGNOSIS — F332 Major depressive disorder, recurrent severe without psychotic features: Secondary | ICD-10-CM

## 2022-07-09 DIAGNOSIS — E782 Mixed hyperlipidemia: Secondary | ICD-10-CM

## 2022-07-09 DIAGNOSIS — F411 Generalized anxiety disorder: Secondary | ICD-10-CM

## 2022-07-09 DIAGNOSIS — K219 Gastro-esophageal reflux disease without esophagitis: Secondary | ICD-10-CM

## 2022-07-11 ENCOUNTER — Other Ambulatory Visit: Payer: Self-pay

## 2022-07-16 ENCOUNTER — Encounter: Payer: Self-pay | Admitting: Family Medicine

## 2022-07-16 ENCOUNTER — Ambulatory Visit (INDEPENDENT_AMBULATORY_CARE_PROVIDER_SITE_OTHER): Payer: 59 | Admitting: Family Medicine

## 2022-07-16 VITALS — BP 126/78 | HR 104 | Temp 97.8°F | Ht 60.0 in | Wt 141.0 lb

## 2022-07-16 DIAGNOSIS — F17218 Nicotine dependence, cigarettes, with other nicotine-induced disorders: Secondary | ICD-10-CM

## 2022-07-16 DIAGNOSIS — Z1231 Encounter for screening mammogram for malignant neoplasm of breast: Secondary | ICD-10-CM

## 2022-07-16 DIAGNOSIS — J410 Simple chronic bronchitis: Secondary | ICD-10-CM

## 2022-07-16 DIAGNOSIS — J301 Allergic rhinitis due to pollen: Secondary | ICD-10-CM

## 2022-07-16 DIAGNOSIS — E782 Mixed hyperlipidemia: Secondary | ICD-10-CM | POA: Diagnosis not present

## 2022-07-16 DIAGNOSIS — E271 Primary adrenocortical insufficiency: Secondary | ICD-10-CM

## 2022-07-16 DIAGNOSIS — E559 Vitamin D deficiency, unspecified: Secondary | ICD-10-CM

## 2022-07-16 DIAGNOSIS — K219 Gastro-esophageal reflux disease without esophagitis: Secondary | ICD-10-CM

## 2022-07-16 DIAGNOSIS — M810 Age-related osteoporosis without current pathological fracture: Secondary | ICD-10-CM | POA: Diagnosis not present

## 2022-07-16 DIAGNOSIS — F411 Generalized anxiety disorder: Secondary | ICD-10-CM | POA: Diagnosis not present

## 2022-07-16 DIAGNOSIS — E039 Hypothyroidism, unspecified: Secondary | ICD-10-CM

## 2022-07-16 MED ORDER — PROMETHAZINE-DM 6.25-15 MG/5ML PO SYRP: 5.0000 mL | ORAL_SOLUTION | Freq: Two times a day (BID) | ORAL | 0 refills | Status: DC | PRN

## 2022-07-16 MED ORDER — FENOFIBRATE 160 MG PO TABS: 160.0000 mg | ORAL_TABLET | Freq: Every day | ORAL | 0 refills | Status: DC

## 2022-07-16 NOTE — Assessment & Plan Note (Signed)
Decreasing to 1/2 ppd.

## 2022-07-16 NOTE — Patient Instructions (Addendum)
Call Dr. Elenore Rota management office due to deformity felt in right lower back and pain.  Talk with Pharmacist about RSV vaccine Use nasal saline rinse 1-2 times daily.

## 2022-07-16 NOTE — Assessment & Plan Note (Signed)
Check vitamin D. 

## 2022-07-16 NOTE — Assessment & Plan Note (Addendum)
Management by endocrinology.  Continue cortef 10 mg twice daily.

## 2022-07-16 NOTE — Assessment & Plan Note (Addendum)
Well controlled. Continue pantoprazole 40 mg twice daily  and Pepcid 20 mg twice daily.

## 2022-07-16 NOTE — Assessment & Plan Note (Signed)
Continue spiriva.  Continue albuterol. Continue promethazine-dextromethorphan syrup one tsp twice daily as needed cough.

## 2022-07-16 NOTE — Assessment & Plan Note (Signed)
Well controlled.  No changes to medicines. Taking Atorvastatin 40 mg daily, fenofibrate 160 mg everyday. Continue to work on eating a healthy diet and exercise.

## 2022-07-16 NOTE — Progress Notes (Signed)
Subjective:  Patient ID: Connie Coffey, female    DOB: [birth year]  Age: 67 y.o. MRN: [id number]  Chief Complaint  Patient presents with   Hyperlipidemia   Hypothyroidism   Gastroesophageal Reflux   Anxiety    HPI Hyperlipidemia: Taking Atorvastatin 40 mg daily, fenofibrate 160 mg everyday. Hypothyroidism: Takes Levothyroxine 75 mcg daily. Depression/anxiety: Continue duloxetine 60 mg daily, buspirone 30 mg twice daily, hydroxyzine 25 mg three times a day, and on abilify 2 mg once daily.    GERD: Omeprazole 40 mg twice daily. Pepcid 20 mg 2 daily. Ondansetron 4 mg three times a day as needed nausea and vomiting. Has never been on any other PPIs.  COPD: on spiriva respimat one inhaltion twice daily and albuterol 2 puffs four times a day as needed.        07/16/2022   10:59 AM 03/19/2022    9:57 AM 03/19/2022    9:42 AM 02/14/2022   11:03 AM 12/08/2021    9:29 AM  Depression screen PHQ 2/9  Decreased Interest 0 1 1 0 0  Down, Depressed, Hopeless 0 1 1 1 1  $ PHQ - 2 Score 0 2 2 1 1  $ Altered sleeping 0 1 1  1  $ Tired, decreased energy 3 3 3  1  $ Change in appetite 3 0 1  2  Feeling bad or failure about yourself  0 1 1  0  Trouble concentrating 3 0 1  0  Moving slowly or fidgety/restless 0 1 1  0  Suicidal thoughts 0 0 1  0  PHQ-9 Score 9 8 11  5  $ Difficult doing work/chores Not difficult at all Very difficult Very difficult  Not difficult at all         05/08/2021   10:39 AM 07/21/2021    6:12 AM 11/03/2021    1:42 PM 02/14/2022   11:02 AM 07/16/2022   10:58 AM  Fall Risk  Falls in the past year? 1   0 0  Was there an injury with Fall? 0   0 0  Fall Risk Category Calculator 2   0 1  Fall Risk Category (Retired) Moderate   Low   (RETIRED) Patient Fall Risk Level  Moderate fall risk Low fall risk    Patient at Risk for Falls Due to History of fall(s)    No Fall Risks  Fall risk Follow up     Falls evaluation completed      Review of Systems  Constitutional:  Negative for  chills, fatigue and fever.  HENT:  Negative for congestion, ear pain, rhinorrhea and sore throat.   Respiratory:  Positive for cough. Negative for shortness of breath.   Cardiovascular:  Negative for chest pain.  Gastrointestinal:  Negative for abdominal pain, constipation, diarrhea, nausea and vomiting.  Genitourinary:  Negative for dysuria and urgency.  Musculoskeletal:  Positive for arthralgias, back pain and myalgias.  Neurological:  Negative for dizziness, weakness, light-headedness and headaches.  Psychiatric/Behavioral:  Negative for dysphoric mood. The patient is not nervous/anxious.     Current Outpatient Medications on File Prior to Visit  Medication Sig Dispense Refill   Abaloparatide (TYMLOS) 3120 MCG/1.56ML SOPN Inject 80 mcg into the skin daily. 4.68 mL 1   albuterol (VENTOLIN HFA) 108 (90 Base) MCG/ACT inhaler Inhale 2 puffs into the lungs every 4 (four) hours as needed. 18 g 2   ARIPiprazole (ABILIFY) 5 MG tablet TAKE 1 TABLET (5 MG TOTAL) BY MOUTH DAILY. 90 tablet 1  atorvastatin (LIPITOR) 40 MG tablet Take 1 tablet (40 mg total) by mouth daily. 90 tablet 3   Azelastine-Fluticasone 137-50 MCG/ACT SUSP Place 1 spray into the nose every 12 (twelve) hours. 69 g 3   bethanechol (URECHOLINE) 25 MG tablet Take 1 tablet (25 mg total) by mouth in the morning and at bedtime. 180 tablet 1   busPIRone (BUSPAR) 30 MG tablet TAKE 1 TABLET BY MOUTH TWICE A DAY 180 tablet 1   ciclopirox (PENLAC) 8 % solution Apply topically at bedtime. Apply over nail and surrounding skin. Apply daily over previous coat. After seven (7) days, may remove with alcohol and continue cycle. 6.6 mL 0   cyclobenzaprine (FLEXERIL) 10 MG tablet TAKE 1 TABLET BY MOUTH 3  TIMES DAILY 270 tablet 1   DULoxetine (CYMBALTA) 60 MG capsule TAKE 1 CAPSULE BY MOUTH  DAILY 90 capsule 3   gabapentin (NEURONTIN) 600 MG tablet TAKE 1 TABLET BY MOUTH 4 TIMES  DAILY 400 tablet 2   hydrocortisone (CORTEF) 10 MG tablet Take 10 mg by  mouth 2 (two) times daily.     hydrOXYzine (VISTARIL) 25 MG capsule TAKE 1 CAPSULE (25 MG TOTAL) BY MOUTH EVERY 8 (EIGHT) HOURS AS NEEDED FOR ANXIETY OR ITCHING. 270 capsule 0   Insulin Pen Needle (GLOBAL EASY GLIDE PEN NEEDLES) 32G X 4 MM MISC Use one needle daily as directed     levocetirizine (XYZAL) 5 MG tablet TAKE 1 TABLET BY MOUTH EVERY DAY IN THE EVENING 90 tablet 3   levothyroxine (SYNTHROID) 75 MCG tablet TAKE 1 TABLET BY MOUTH EVERY DAY BEFORE BREAKFAST 90 tablet 0   lubiprostone (AMITIZA) 8 MCG capsule TAKE 1 CAPSULE BY MOUTH TWICE  DAILY WITH MEALS 180 capsule 3   montelukast (SINGULAIR) 10 MG tablet TAKE 1 TABLET BY MOUTH  DAILY 90 tablet 3   Multiple Vitamin (MULTI-VITAMINS) TABS Take 1 tablet by mouth daily.     nitrofurantoin (MACRODANTIN) 100 MG capsule Take 100 mg by mouth daily.     olopatadine (PATANOL) 0.1 % ophthalmic solution Place 1 drop into both eyes daily as needed for allergies.     ondansetron (ZOFRAN) 4 MG tablet Take 1 tablet (4 mg total) by mouth every 8 (eight) hours as needed for nausea or vomiting. 40 tablet 2   oxyCODONE-acetaminophen (PERCOCET) 10-325 MG tablet TAKE 1 TAB EVERY 4 HOURS FOR 5 DAYS THEN 1 TAB TWICE A DAY AS NEEDED FOR SEVERE BREAKTHROUGH PAIN  0   pantoprazole (PROTONIX) 40 MG tablet TAKE 1 TABLET BY MOUTH TWICE A DAY 180 tablet 1   primidone (MYSOLINE) 50 MG tablet TAKE 1 TABLET BY MOUTH TWICE A DAY 180 tablet 1   promethazine-dextromethorphan (PROMETHAZINE-DM) 6.25-15 MG/5ML syrup Take 5 mLs by mouth 4 (four) times daily as needed for cough. 118 mL 0   Propylene Glycol (SYSTANE BALANCE) 0.6 % SOLN Place 1 drop into both eyes daily as needed (dry eyes).     SPIRIVA RESPIMAT 2.5 MCG/ACT AERS USE 1 INHALATION BY MOUTH  TWICE DAILY 12 g 3   Vitamin D, Ergocalciferol, (DRISDOL) 50000 units CAPS capsule Take 50,000 Units by mouth every Wednesday.     No current facility-administered medications on file prior to visit.   Past Medical History:   Diagnosis Date   Adrenal insufficiency (HCC)    Anxiety    Arthritis    Carpal tunnel syndrome    Both hands, worse in right   Chronic constipation    COPD (chronic obstructive pulmonary disease) (  [place])    CVA (cerebral vascular accident) ([place])    Depression    Facet syndrome    Fibromyalgia    GERD (gastroesophageal reflux disease)    Heart murmur    Hypercholesteremia    IBS (irritable bowel syndrome)    Ischemic stroke ([place])    07/19/2004   Lumbar radiculopathy    Osteoporosis    Radiculopathy, lumbar region 05/02/2021   RLS (restless legs syndrome)    Spinal stenosis    TMJ (temporomandibular joint disorder)    Trochanteric bursitis of both hips    Vascular malformation    spinal cord   Past Surgical History:  Procedure Laterality Date   APPENDECTOMY     CARPAL TUNNEL RELEASE Right 12/13/2017   surgery   CHOLECYSTECTOMY     COLONOSCOPY  05/06/2017   Colonic polyps status post polypectomy. Interan land external hemorrhoids.    ESOPHAGOGASTRODUODENOSCOPY  04/28/2007   Mild gastrtiis. Otherwise normal EGD.    HEMORROIDECTOMY     LAMINECTOMY AND MICRODISCECTOMY LUMBAR SPINE  01/03/2005   NECK SURGERY     x2    OTHER SURGICAL HISTORY  04/24/2004   anterior cervical discectomy and fusion at C5-C6   SPINAL CORD STIMULATOR INSERTION N/A 07/21/2021   Procedure: Permanant Spinal cord stimulator placement leads and battery under fluroscopy;  Surgeon: Reece Agar, MD;  Location: [facility];  Service: Neurosurgery;  Laterality: N/A;   TONSILLECTOMY     TOTAL ABDOMINAL HYSTERECTOMY      Family History  Problem Relation Age of Onset   Supraventricular tachycardia Mother    Dementia Mother    Hyperlipidemia Mother    Hypertension Mother    Osteoarthritis Mother    Osteoporosis Mother    Breast cancer Other    Breast cancer Other    Colon cancer Neg Hx    Esophageal cancer Neg Hx    Social History   Socioeconomic History   Marital status: Divorced    Spouse name:  Not on file   Number of children: 1   Years of education: Not on file   Highest education level: Not on file  Occupational History   Occupation: Disabled  Tobacco Use   Smoking status: Every Day    Packs/day: 0.50    Types: Cigarettes   Smokeless tobacco: Never  Vaping Use   Vaping Use: Never used  Substance and Sexual Activity   Alcohol use: Not Currently    Comment: quit 19 years   Drug use: Never   Sexual activity: Not Currently  Other Topics Concern   Not on file  Social History Narrative   Disabled son lives with her sometimes and his father sometimes.  Retaj is working hard to have a relationship with her son's father and his wife.  They help her when needed.   Social Determinants of Health   Financial Resource Strain: Low Risk  (11/15/2021)   Overall Financial Resource Strain (CARDIA)    Difficulty of Paying Living Expenses: Not hard at all  Food Insecurity: No Food Insecurity (02/14/2022)   Hunger Vital Sign    Worried About Running Out of Food in the Last Year: Never true    Ran Out of Food in the Last Year: Never true  Transportation Needs: No Transportation Needs (02/14/2022)   PRAPARE - Hydrologist (Medical): No    Lack of Transportation (Non-Medical): No  Physical Activity: Inactive (11/15/2021)   Exercise Vital Sign    Days of Exercise  per Week: 0 days    Minutes of Exercise per Session: 0 min  Stress: Stress Concern Present (06/30/2019)   [place]    Feeling of Stress : Very much  Social Connections: Socially Isolated (07/16/2022)   Social Connection and Isolation Panel [NHANES]    Frequency of Communication with Friends and Family: Three times a week    Frequency of Social Gatherings with Friends and Family: Three times a week    Attends Religious Services: Never    Active Member of Clubs or Organizations: No    Attends Archivist Meetings: Never     Marital Status: Divorced    Objective:  BP 126/78   Pulse (!) 104   Temp 97.8 F (36.6 C)   Ht 5' (1.524 m)   Wt 141 lb (64 kg)   SpO2 99%   BMI 27.54 kg/m      07/16/2022   10:57 AM 05/08/2022    2:46 PM 03/19/2022   10:03 AM  BP/Weight  Systolic BP 123XX123 123XX123 [id number]  Diastolic BP 78 72 82  Wt. (Lbs) 141 134 132  BMI 27.54 kg/m2 26.17 kg/m2 23.38 kg/m2    Physical Exam Vitals reviewed.  Constitutional:      Appearance: Normal appearance. She is normal weight.  HENT:     Right Ear: Tympanic membrane, ear canal and external ear normal.     Left Ear: Tympanic membrane, ear canal and external ear normal.     Nose: Rhinorrhea present. No congestion.     Mouth/Throat:     Lips: Pink.     Mouth: Mucous membranes are moist.     Pharynx: No oropharyngeal exudate or posterior oropharyngeal erythema.  Neck:     Vascular: No carotid bruit.  Cardiovascular:     Rate and Rhythm: Normal rate and regular rhythm.     Heart sounds: Murmur heard.  Pulmonary:     Effort: Pulmonary effort is normal. No respiratory distress.     Breath sounds: Normal breath sounds.  Abdominal:     General: Abdomen is flat. Bowel sounds are normal.     Palpations: Abdomen is soft.     Tenderness: There is abdominal tenderness in the epigastric area.  Musculoskeletal:     Lumbar back: Deformity and tenderness present.     Comments: Has stimulator for pain. Hardware felt under the skin (right side)   Neurological:     Mental Status: She is alert and oriented to person, place, and time.  Psychiatric:        Mood and Affect: Mood normal.        Behavior: Behavior normal.     Diabetic Foot Exam - Simple   No data filed      Lab Results  Component Value Date   WBC 13.5 (H) 03/19/2022   HGB 14.1 03/19/2022   HCT 41.2 03/19/2022   PLT 514 (H) 03/19/2022   GLUCOSE 101 (H) 01/23/2022   CHOL 165 12/08/2021   TRIG 124 12/08/2021   HDL 77 12/08/2021   LDLCALC 67 12/08/2021   ALT 11 01/23/2022    AST 14 01/23/2022   NA 145 (H) 01/23/2022   K 4.0 01/23/2022   CL 103 01/23/2022   CREATININE 0.78 01/23/2022   BUN 16 01/23/2022   CO2 28 01/23/2022   TSH 0.896 01/23/2022      Assessment & Plan:    Gastroesophageal reflux disease without esophagitis Assessment & Plan: Well controlled.  Continue pantoprazole 40 mg twice daily  and Pepcid 20 mg twice daily.    Mixed hyperlipidemia Assessment & Plan: Well controlled.  No changes to medicines. Taking Atorvastatin 40 mg daily, fenofibrate 160 mg everyday. Continue to work on eating a healthy diet and exercise.   Orders: -     Fenofibrate; Take 1 tablet (160 mg total) by mouth daily.  Dispense: 90 tablet; Refill: 0 -     CBC with Differential/Platelet -     Comprehensive metabolic panel -     Lipid panel  Vitamin D deficiency Assessment & Plan: Check vitamin D.    GAD (generalized anxiety disorder) Assessment & Plan: The current medical regimen is effective;  continue present plan and medications. Continue duloxetine 60 mg daily, buspirone 30 mg twice daily, hydroxyzine 25 mg three times a day, and on abilify 2 mg once daily.        Cigarette nicotine dependence with other nicotine-induced disorder Assessment & Plan: Decreasing to 1/2 ppd.   Acquired hypothyroidism Assessment & Plan: Previously well controlled Continue Synthroid at current dose  Management per specialist.     Age-related osteoporosis without current pathological fracture -     DG Bone Density; Future  Screening mammogram for breast cancer -     Digital Screening Mammogram, Left and Right; Future  Adrenal insufficiency (Addison's disease) ([place]) Assessment & Plan: Management by endocrinology.  Continue cortef 10 mg twice daily.    Non-seasonal allergic rhinitis due to pollen Assessment & Plan: Continue singulair, dymista, xyzal. Continue sinus rinse.    Simple chronic bronchitis (HCC) Assessment & Plan: Continue spiriva.   Continue albuterol. Continue promethazine-dextromethorphan syrup one tsp twice daily as needed cough.   Orders: -     Promethazine-DM; Take 5 mLs by mouth 2 (two) times daily as needed for cough.  Dispense: 118 mL; Refill: 0     Meds ordered this encounter  Medications   fenofibrate 160 MG tablet    Sig: Take 1 tablet (160 mg total) by mouth daily.    Dispense:  90 tablet    Refill:  0   promethazine-dextromethorphan (PROMETHAZINE-DM) 6.25-15 MG/5ML syrup    Sig: Take 5 mLs by mouth 2 (two) times daily as needed for cough.    Dispense:  118 mL    Refill:  0    Orders Placed This Encounter  Procedures   DG Bone Density   MM Digital Screening   CBC with Differential/Platelet   Comprehensive metabolic panel   Lipid panel     Follow-up: Return in about 3 months (around 10/14/2022) for Chronic Follow .   I,Katherina A Bramblett,acting as a scribe for [name], MD.,have documented all relevant documentation on the behalf of [name], MD,as directed by  [name], MD while in the presence of [name], MD.   An After Visit Summary was printed and given to the patient.  I attest that I have reviewed this visit and agree with the plan scribed by my staff.   [name], MD [facility] [phone]

## 2022-07-16 NOTE — Assessment & Plan Note (Signed)
The current medical regimen is effective;  continue present plan and medications. Continue duloxetine 60 mg daily, buspirone 30 mg twice daily, hydroxyzine 25 mg three times a day, and on abilify 2 mg once daily.

## 2022-07-16 NOTE — Assessment & Plan Note (Signed)
Continue singulair, dymista, xyzal. Continue sinus rinse.

## 2022-07-16 NOTE — Assessment & Plan Note (Addendum)
Previously well controlled Continue Synthroid at current dose  Management per specialist.

## 2022-07-17 ENCOUNTER — Other Ambulatory Visit: Payer: Self-pay

## 2022-07-17 DIAGNOSIS — G8929 Other chronic pain: Secondary | ICD-10-CM

## 2022-07-17 LAB — COMPREHENSIVE METABOLIC PANEL
ALT: 16 IU/L (ref 0–32)
AST: 19 IU/L (ref 0–40)
Albumin/Globulin Ratio: 2.6 — ABNORMAL HIGH (ref 1.2–2.2)
Albumin: 5.1 g/dL — ABNORMAL HIGH (ref 3.9–4.9)
Alkaline Phosphatase: 50 IU/L (ref 44–121)
BUN/Creatinine Ratio: 18 (ref 12–28)
BUN: 14 mg/dL (ref 8–27)
Bilirubin Total: 0.2 mg/dL (ref 0.0–1.2)
CO2: 26 mmol/L (ref 20–29)
Calcium: 10 mg/dL (ref 8.7–10.3)
Chloride: 98 mmol/L (ref 96–106)
Creatinine, Ser: 0.78 mg/dL (ref 0.57–1.00)
Globulin, Total: 2 g/dL (ref 1.5–4.5)
Glucose: 92 mg/dL (ref 70–99)
Potassium: 5.1 mmol/L (ref 3.5–5.2)
Sodium: 140 mmol/L (ref 134–144)
Total Protein: 7.1 g/dL (ref 6.0–8.5)
eGFR: 83 mL/min/{1.73_m2} (ref >59)

## 2022-07-17 LAB — CBC WITH DIFFERENTIAL/PLATELET
Basophils Absolute: 0.1 10*3/uL (ref 0.0–0.2)
Basos: 1 %
EOS (ABSOLUTE): 0.1 10*3/uL (ref 0.0–0.4)
Eos: 1 %
Hematocrit: 42.7 % (ref 34.0–46.6)
Hemoglobin: 14.4 g/dL (ref 11.1–15.9)
Immature Grans (Abs): 0 10*3/uL (ref 0.0–0.1)
Immature Granulocytes: 0 %
Lymphocytes Absolute: 1.9 10*3/uL (ref 0.7–3.1)
Lymphs: 18 %
MCH: 28.8 pg (ref 26.6–33.0)
MCHC: 33.7 g/dL (ref 31.5–35.7)
MCV: 85 fL (ref 79–97)
Monocytes Absolute: 0.5 10*3/uL (ref 0.1–0.9)
Monocytes: 5 %
Neutrophils Absolute: 8.2 10*3/uL — ABNORMAL HIGH (ref 1.4–7.0)
Neutrophils: 75 %
Platelets: 585 10*3/uL — ABNORMAL HIGH (ref 150–450)
RBC: 5 x10E6/uL (ref 3.77–5.28)
RDW: 13.3 % (ref 11.7–15.4)
WBC: 10.8 10*3/uL (ref 3.4–10.8)

## 2022-07-17 LAB — CARDIOVASCULAR RISK ASSESSMENT

## 2022-07-17 LAB — LIPID PANEL
Chol/HDL Ratio: 1.9 ratio (ref 0.0–4.4)
Cholesterol, Total: 189 mg/dL (ref 100–199)
HDL: 102 mg/dL (ref >39)
LDL Chol Calc (NIH): 70 mg/dL (ref 0–99)
Triglycerides: 97 mg/dL (ref 0–149)
VLDL Cholesterol Cal: 17 mg/dL (ref 5–40)

## 2022-07-17 NOTE — Progress Notes (Signed)
Blood count abnormal.  Platelets elevated.  Forward to Dr. Bobby Rumpf, history seen previously. Liver function normal.  Kidney function normal.  Cholesterol: good

## 2022-07-18 ENCOUNTER — Ambulatory Visit: Payer: Self-pay

## 2022-07-18 ENCOUNTER — Ambulatory Visit (INDEPENDENT_AMBULATORY_CARE_PROVIDER_SITE_OTHER): Payer: 59

## 2022-07-18 DIAGNOSIS — Z0000 Encounter for general adult medical examination without abnormal findings: Secondary | ICD-10-CM

## 2022-07-18 NOTE — Progress Notes (Signed)
Subjective:   Connie Coffey is a 67 y.o. female who presents for Medicare Annual (Subsequent) preventive examination.  I connected with  [place] on 07/18/22 by a audio enabled telemedicine application and verified that I am speaking with the correct person using two identifiers.  Patient Location: Home  Provider Location: Office/Clinic  I discussed the limitations of evaluation and management by telemedicine. The patient expressed understanding and agreed to proceed.   Review of Systems     Cardiac Risk Factors include: advanced age (>71mn, >>57women)     Objective:    There were no vitals filed for this visit. There is no height or weight on file to calculate BMI.     07/18/2022    2:12 PM 11/03/2021    1:42 PM 07/19/2021    2:05 PM 04/25/2021    2:11 PM 02/06/2021   10:42 AM 10/21/2020    1:43 PM 09/22/2020    2:11 PM  Advanced Directives  Does Patient Have a Medical Advance Directive? No Yes Yes Yes Yes Yes Yes  Type of AChief Operating Officerof AEast ConemaughLiving will  Living will;Healthcare Power of Attorney  Does patient want to make changes to medical advance directive?   No - Patient declined  No - Patient declined    Copy of HNixain Chart?   No - copy requested  Yes - validated most recent copy scanned in chart (See row information)    Would patient like information on creating a medical advance directive? No - Patient declined          Current Medications (verified) Outpatient Encounter Medications as of 07/18/2022  Medication Sig   Abaloparatide (TYMLOS) 3120 MCG/1.56ML SOPN Inject 80 mcg into the skin daily.   albuterol (VENTOLIN HFA) 108 (90 Base) MCG/ACT inhaler Inhale 2 puffs into the lungs every 4 (four) hours as needed.   ARIPiprazole (ABILIFY) 5 MG tablet TAKE 1 TABLET (5 MG TOTAL) BY MOUTH DAILY.   atorvastatin (LIPITOR) 40 MG tablet Take 1 tablet (40 mg total) by mouth daily.    Azelastine-Fluticasone 137-50 MCG/ACT SUSP Place 1 spray into the nose every 12 (twelve) hours.   bethanechol (URECHOLINE) 25 MG tablet Take 1 tablet (25 mg total) by mouth in the morning and at bedtime.   busPIRone (BUSPAR) 30 MG tablet TAKE 1 TABLET BY MOUTH TWICE A DAY   ciclopirox (PENLAC) 8 % solution Apply topically at bedtime. Apply over nail and surrounding skin. Apply daily over previous coat. After seven (7) days, may remove with alcohol and continue cycle.   cyclobenzaprine (FLEXERIL) 10 MG tablet TAKE 1 TABLET BY MOUTH 3  TIMES DAILY   DULoxetine (CYMBALTA) 60 MG capsule TAKE 1 CAPSULE BY MOUTH  DAILY   fenofibrate 160 MG tablet Take 1 tablet (160 mg total) by mouth daily.   gabapentin (NEURONTIN) 600 MG tablet TAKE 1 TABLET BY MOUTH 4 TIMES  DAILY   hydrocortisone (CORTEF) 10 MG tablet Take 10 mg by mouth 2 (two) times daily.   hydrOXYzine (VISTARIL) 25 MG capsule TAKE 1 CAPSULE (25 MG TOTAL) BY MOUTH EVERY 8 (EIGHT) HOURS AS NEEDED FOR ANXIETY OR ITCHING.   Insulin Pen Needle (GLOBAL EASY GLIDE PEN NEEDLES) 32G X 4 MM MISC Use one needle daily as directed   levocetirizine (XYZAL) 5 MG tablet TAKE 1 TABLET BY MOUTH EVERY DAY IN THE EVENING   levothyroxine (SYNTHROID) 75 MCG tablet TAKE 1  TABLET BY MOUTH EVERY DAY BEFORE BREAKFAST   lubiprostone (AMITIZA) 8 MCG capsule TAKE 1 CAPSULE BY MOUTH TWICE  DAILY WITH MEALS   montelukast (SINGULAIR) 10 MG tablet TAKE 1 TABLET BY MOUTH  DAILY   Multiple Vitamin (MULTI-VITAMINS) TABS Take 1 tablet by mouth daily.   olopatadine (PATANOL) 0.1 % ophthalmic solution Place 1 drop into both eyes daily as needed for allergies.   oxyCODONE-acetaminophen (PERCOCET) 10-325 MG tablet TAKE 1 TAB EVERY 4 HOURS FOR 5 DAYS THEN 1 TAB TWICE A DAY AS NEEDED FOR SEVERE BREAKTHROUGH PAIN   pantoprazole (PROTONIX) 40 MG tablet TAKE 1 TABLET BY MOUTH TWICE A DAY   primidone (MYSOLINE) 50 MG tablet TAKE 1 TABLET BY MOUTH TWICE A DAY   promethazine-dextromethorphan  (PROMETHAZINE-DM) 6.25-15 MG/5ML syrup Take 5 mLs by mouth 2 (two) times daily as needed for cough.   Propylene Glycol (SYSTANE BALANCE) 0.6 % SOLN Place 1 drop into both eyes daily as needed (dry eyes).   SPIRIVA RESPIMAT 2.5 MCG/ACT AERS USE 1 INHALATION BY MOUTH  TWICE DAILY   Vitamin D, Ergocalciferol, (DRISDOL) 50000 units CAPS capsule Take 50,000 Units by mouth every Wednesday.   [DISCONTINUED] nitrofurantoin (MACRODANTIN) 100 MG capsule Take 100 mg by mouth daily.   [DISCONTINUED] ondansetron (ZOFRAN) 4 MG tablet Take 1 tablet (4 mg total) by mouth every 8 (eight) hours as needed for nausea or vomiting.   [DISCONTINUED] promethazine-dextromethorphan (PROMETHAZINE-DM) 6.25-15 MG/5ML syrup Take 5 mLs by mouth 4 (four) times daily as needed for cough.   No facility-administered encounter medications on file as of 07/18/2022.    Allergies (verified) Beta vulgaris, Penicillins, Celecoxib, Chantix [varenicline], Dog epithelium allergy skin test, Fish oil, Gramineae pollens, Molds & smuts, Nsaids, Other, Tape, and Nicotine   History: Past Medical History:  Diagnosis Date   Adrenal insufficiency (HCC)    Anxiety    Arthritis    Carpal tunnel syndrome    Both hands, worse in right   Chronic constipation    COPD (chronic obstructive pulmonary disease) (HCC)    CVA (cerebral vascular accident) ([place])    Depression    Facet syndrome    Fibromyalgia    GERD (gastroesophageal reflux disease)    Heart murmur    Hypercholesteremia    IBS (irritable bowel syndrome)    Ischemic stroke ([place])    07/19/2004   Lumbar radiculopathy    Osteoporosis    Radiculopathy, lumbar region 05/02/2021   RLS (restless legs syndrome)    Spinal stenosis    TMJ (temporomandibular joint disorder)    Trochanteric bursitis of both hips    Vascular malformation    spinal cord   Past Surgical History:  Procedure Laterality Date   APPENDECTOMY     CARPAL TUNNEL RELEASE Right 12/13/2017   surgery    CHOLECYSTECTOMY     COLONOSCOPY  05/06/2017   Colonic polyps status post polypectomy. Interan land external hemorrhoids.    ESOPHAGOGASTRODUODENOSCOPY  04/28/2007   Mild gastrtiis. Otherwise normal EGD.    HEMORROIDECTOMY     LAMINECTOMY AND MICRODISCECTOMY LUMBAR SPINE  01/03/2005   NECK SURGERY     x2    OTHER SURGICAL HISTORY  04/24/2004   anterior cervical discectomy and fusion at C5-C6   SPINAL CORD STIMULATOR INSERTION N/A 07/21/2021   Procedure: Permanant Spinal cord stimulator placement leads and battery under fluroscopy;  Surgeon: Reece Agar, MD;  Location: [facility];  Service: Neurosurgery;  Laterality: N/A;   TONSILLECTOMY     TOTAL ABDOMINAL  HYSTERECTOMY     Family History  Problem Relation Age of Onset   Supraventricular tachycardia Mother    Dementia Mother    Hyperlipidemia Mother    Hypertension Mother    Osteoarthritis Mother    Osteoporosis Mother    Breast cancer Other    Breast cancer Other    Colon cancer Neg Hx    Esophageal cancer Neg Hx    Social History   Socioeconomic History   Marital status: Divorced    Spouse name: Not on file   Number of children: 1   Years of education: Not on file   Highest education level: Not on file  Occupational History   Occupation: Disabled  Tobacco Use   Smoking status: Every Day    Packs/day: 0.50    Types: Cigarettes   Smokeless tobacco: Never  Vaping Use   Vaping Use: Never used  Substance and Sexual Activity   Alcohol use: Not Currently    Comment: quit 19 years   Drug use: Never   Sexual activity: Not Currently  Other Topics Concern   Not on file  Social History Narrative   Disabled son lives with her sometimes and his father sometimes.  Kristell is working hard to have a relationship with her son's father and his wife.  They help her when needed.   Social Determinants of Health   Financial Resource Strain: Low Risk  (11/15/2021)   Overall Financial Resource Strain (CARDIA)    Difficulty of Paying  Living Expenses: Not hard at all  Food Insecurity: No Food Insecurity (07/18/2022)   Hunger Vital Sign    Worried About Running Out of Food in the Last Year: Never true    Ran Out of Food in the Last Year: Never true  Transportation Needs: No Transportation Needs (07/18/2022)   PRAPARE - Hydrologist (Medical): No    Lack of Transportation (Non-Medical): No  Physical Activity: Inactive (07/18/2022)   Exercise Vital Sign    Days of Exercise per Week: 0 days    Minutes of Exercise per Session: 0 min  Stress: No Stress Concern Present (07/18/2022)   [place]    Feeling of Stress : Only a little  Social Connections: Socially Isolated (07/18/2022)   Social Connection and Isolation Panel [NHANES]    Frequency of Communication with Friends and Family: Three times a week    Frequency of Social Gatherings with Friends and Family: Three times a week    Attends Religious Services: Never    Active Member of Clubs or Organizations: No    Attends Archivist Meetings: Never    Marital Status: Divorced    Tobacco Counseling Ready to quit: Not Answered Counseling given: Not Answered   Clinical Intake:                 Diabetic?N/A         Activities of Daily Living    07/18/2022    2:11 PM 07/16/2022   11:00 AM  In your present state of health, do you have any difficulty performing the following activities:  Hearing? 1 0  Comment Left Ear   Vision? 0 0  Difficulty concentrating or making decisions? 0 0  Walking or climbing stairs? 0 0  Dressing or bathing? 0 0  Doing errands, shopping? 0 0  Preparing Food and eating ? N   Using the Toilet? N   In the past  six months, have you accidently leaked urine? N   Do you have problems with loss of bowel control? N   Managing your Medications? N   Managing your Finances? N   Housekeeping or managing your Housekeeping? N      Patient Care Team: [name], MD as PCP - General (Family Medicine) Revankar, Reita Cliche, MD as PCP - Cardiology (Cardiology) Jackquline Denmark, MD as PCP - Gastroenterology (Gastroenterology) Eustace Moore, MD as Consulting Physician (Neurosurgery) Landis Martins, DPM as Consulting Physician (Podiatry) Joie Bimler, MD as Consulting Physician (Urology) Marice Potter, MD as Consulting Physician (Oncology) Lane Hacker, [facility] (Pharmacist) Thana Ates, RN as [place] any recent [place] you may have received from other than [facility] providers in the past year (date may be approximate).     Assessment:   This is a routine wellness examination for [place].  Hearing/Vision screen No results found.  Dietary issues and exercise activities discussed: Current Exercise Habits: The patient does not participate in regular exercise at present   Goals Addressed   None   Depression Screen    07/18/2022    2:09 PM 07/16/2022   10:59 AM 03/19/2022    9:57 AM 03/19/2022    9:42 AM 02/14/2022   11:03 AM 12/08/2021    9:29 AM 05/08/2021   10:39 AM  PHQ 2/9 Scores  PHQ - 2 Score 0 0 '2 2 1 1 '$ 0  PHQ- 9 Score 0 '9 8 11  5     '$ Fall Risk    07/18/2022    2:10 PM 07/16/2022   10:58 AM 02/14/2022   11:02 AM 05/08/2021   10:39 AM 02/06/2021   10:43 AM  Fall Risk   Falls in the past year? 1 0 0 1 1  Number falls in past yr: 1 1 0 1 0  Injury with Fall? 0 0 0 0 0  Risk for fall due to : History of fall(s) No Fall Risks  History of fall(s) Impaired mobility;History of fall(s);Orthopedic patient;Medication side effect  Follow up  Falls evaluation completed   Falls evaluation completed;Education provided;Falls prevention discussed    FALL RISK PREVENTION PERTAINING TO THE HOME:  Any stairs in or around the home? No  If so, are there any without handrails?  N/A Home free of loose throw rugs in walkways, pet beds, electrical cords, etc? Yes   Adequate lighting in your home to reduce risk of falls? Yes   ASSISTIVE DEVICES UTILIZED TO PREVENT FALLS:  Life alert? No  Use of a cane, walker or w/c? Yes  Grab bars in the bathroom? No  Shower chair or bench in shower? Yes  Elevated toilet seat or a handicapped toilet? Yes   TIMED UP AND GO:  Was the test performed? No .  Length of time to ambulate 10 feet: N/A sec.     Cognitive Function:        02/06/2021   10:58 AM 06/30/2019    1:43 PM  6CIT Screen  What Year? 0 points 0 points  What month? 0 points 0 points  What time? 0 points 0 points  Count back from 20 0 points 0 points  Months in reverse 0 points 0 points  Repeat phrase 2 points 0 points  Total Score 2 points 0 points    Immunizations Immunization History  Administered Date(s) Administered   COVID-19, mRNA, vaccine(Comirnaty)12 years and older 03/19/2022   Fluad Quad(high Dose 65+) 02/01/2021,  03/19/2022   Influenza-Unspecified 01/26/2018, 02/06/2019, 02/05/2020   PFIZER(Purple Top)SARS-COV-2 Vaccination 01/30/2020, 04/28/2020   Pfizer Covid-19 Vaccine Bivalent Booster 53yr & up 05/08/2021   Pneumococcal Conjugate-13 12/16/2014   Pneumococcal Polysaccharide-23 03/26/2013, 02/06/2021   Td 03/13/2015   Unspecified SARS-COV-2 Vaccination 03/19/2022    TDAP status: Up to date  Flu Vaccine status: Up to date  Pneumococcal vaccine status: Up to date  Covid-19 vaccine status: Completed vaccines  Qualifies for Shingles Vaccine? Yes   Zostavax completed No   Shingrix Completed?: No.    Education has been provided regarding the importance of this vaccine. Patient has been advised to call insurance company to determine out of pocket expense if they have not yet received this vaccine. Advised may also receive vaccine at local pharmacy or Health Dept. Verbalized acceptance and understanding.  Screening Tests Health Maintenance  Topic Date Due   Zoster Vaccines- Shingrix (1 of 2) Never done   COVID-19  Vaccine (5 - 2023-24 season) 05/14/2022   MAMMOGRAM  04/08/2023   Medicare Annual Wellness (AWV)  07/19/2023   DTaP/Tdap/Td (2 - Tdap) 03/12/2025   COLONOSCOPY (Pts 45-457yrInsurance coverage will need to be confirmed)  06/03/2029   Pneumonia Vaccine 6531Years old  Completed   INFLUENZA VACCINE  Completed   DEXA SCAN  Completed   HPV [place] Due  Topic Date Due   Zoster Vaccines- Shingrix (1 of 2) Never done   COVID-19 Vaccine (5 - 2023-24 season) 05/14/2022    Colorectal cancer screening: Type of screening: Colonoscopy. Completed 06/04/2019. Repeat every 10 years  Mammogram status: Ordered 07/16/2022. Pt provided with contact info and advised to call to schedule appt.   Bone Density status: Ordered 07/16/2022. Pt provided with contact info and advised to call to schedule appt.  Lung Cancer Screening: (Low Dose CT Chest recommended if Age 332-80ears, 30 pack-year currently smoking OR have quit w/in 15years.) does not qualify.   Lung Cancer Screening Referral: N/A  Additional Screening:    Vision Screening: Recommended annual ophthalmology exams for early detection of glaucoma and other disorders of the eye. Is the patient up to date with their annual eye exam?  Yes  Who is the provider or what is the name of the office in which the patient attends annual eye exams? [facility], would they like to be referred to a provider to establish care? No .   Dental Screening: Recommended annual dental exams for proper oral hygiene  Community Resource Referral / Chronic Care Management: CRR required this visit?  No   CCM required this visit?  No      Plan:     I have personally reviewed and noted the following in the patient's chart:   Medical and social history Use of alcohol, tobacco or illicit drugs  Current medications and supplements including opioid prescriptions.  Patient is currently taking opioid prescriptions. Information provided to patient regarding non-opioid alternatives. Patient advised to discuss non-opioid treatment plan with their provider. Functional ability and status Nutritional status Physical activity Advanced directives List of other physicians Hospitalizations, surgeries, and ER visits in previous 12 months Vitals Screenings to include cognitive, depression, and falls Referrals and appointments  In addition, I have reviewed and discussed with patient certain preventive protocols, quality metrics, and best practice recommendations. A written personalized care plan for preventive services as well as general preventive health recommendations were provided  to patient.     Burna Forts, [place]   07/18/2022    Ms. Dieterich , Thank you for taking time to come for your Medicare Wellness Visit. I appreciate your ongoing commitment to your health goals. Please review the following plan we discussed and let me know if I can assist you in the future.   These are the goals we discussed:  Goals       Chronic back pain and lack of energy (pt-stated)      Care Coordination Interventions: Reassessed pain. Reviewed new pain in the neck area. Encouraged patient to call MD. Reviewed importance of healthy diet and staying active         Learn More About My Health      Timeframe:  [place] Goal Priority:  High Start Date:                             Expected End Date:                        Follow Up Date 03/2021    - tell my story and reason for my visit - make a list of questions - ask questions - repeat what I heard to make sure I understand - bring a list of my medicines to the visit - speak up when I don't understand    Why is this important?   The best way to learn about your health and care is by talking to the doctor and nurse.  They will answer your questions and give you information in the way that you like best.     Notes:       Manage My Medicine      Timeframe:  [place] Goal Priority:  High Start Date:                             Expected End Date:                       Follow Up Date 03/2021    - call for medicine refill 2 or 3 days before it runs out - keep a list of all the medicines I take; vitamins and herbals too - use a pillbox to sort medicine    Why is this important?   These steps will help you keep on track with your medicines.   Notes:       Prevent Falls and Broken Bones-Osteoporosis      Timeframe:  [place] Goal Priority:  High Start Date:                             Expected End Date:                       Follow Up Date 03/2021    - always use handrails on the stairs - get at least 10 minutes of activity every day - pick up clutter from the floors    Why is this important?   When you fall, there are 3 things that control if a bone breaks or not.  These are the fall itself, how hard and the direction that you fall and how fragile your bones are.  Preventing falls is very important for you because of fragile bones.  Notes:         This is a list of the screening recommended for you and due dates:  Health Maintenance  Topic Date Due   Zoster (Shingles) Vaccine (1 of 2) Never done   COVID-19 Vaccine (5 - 2023-24 season) 05/14/2022   Mammogram  04/08/2023   Medicare Annual Wellness Visit  07/19/2023   DTaP/Tdap/Td vaccine (2 - Tdap) 03/12/2025   Colon Cancer Screening  06/03/2029   Pneumonia Vaccine  Completed   Flu Shot  Completed   DEXA scan (bone density measurement)  Completed   HPV Vaccine  Aged Out

## 2022-07-18 NOTE — Patient Outreach (Signed)
  Care Coordination   Follow Up Visit Note   07/18/2022 Name: LEEZA GRALA MRN: [id number] DOB: 12/12/1955  Vangie Bicker Brittian is a 67 y.o. year old female who sees Cox, Kirsten, MD for primary care. I spoke with  Vangie Bicker Cropper by phone today.  What matters to the patients health and wellness today?  Patient reports that she is have significant back pain. Reports that she saw PCP on 07/16/2022 and Dr. Tobie Poet felt like the patient had a piece of metal budging on the left side of her spine.  Patient reports that she has left leg cramps and right leg tingling.  Reports decrease cough.     Goals Addressed               This Visit's Progress     Chronic back pain and lack of energy (pt-stated)        Interventions Today    Flowsheet Row Most Recent Value  Chronic Disease   Chronic disease during today's visit Other  General Interventions   General Interventions Discussed/Reviewed General Interventions Discussed  Safety Interventions   Safety Discussed/Reviewed Safety Discussed  [assessed concern for bulding in her back, pain and findings from Dr. Tobie Poet.  Called Dr. Windy Carina office and left a message.  Return call from Manuela Schwartz and explained patients condition.  Appointment secured for 07/19/2022 at 0915. Call patient to inform.]                  SDOH assessments and interventions completed:  No     Care Coordination Interventions:  Yes, provided   Follow up plan:  encouraged patient to call me back when she got message.     Encounter Outcome:  Pt. Visit Completed   Tomasa Rand, RN, BSN, CEN [place] Coordinator [phone]

## 2022-07-19 DIAGNOSIS — Z9689 Presence of other specified functional implants: Secondary | ICD-10-CM | POA: Diagnosis not present

## 2022-07-27 ENCOUNTER — Other Ambulatory Visit: Payer: Self-pay | Admitting: Family Medicine

## 2022-07-27 DIAGNOSIS — F331 Major depressive disorder, recurrent, moderate: Secondary | ICD-10-CM

## 2022-07-30 ENCOUNTER — Other Ambulatory Visit: Payer: Self-pay

## 2022-07-30 MED ORDER — SPIRIVA RESPIMAT 2.5 MCG/ACT IN AERS: INHALATION_SPRAY | RESPIRATORY_TRACT | 3 refills | Status: DC

## 2022-08-01 ENCOUNTER — Ambulatory Visit
Admission: RE | Admit: 2022-08-01 | Discharge: 2022-08-01 | Disposition: A | Discharge status: Home / Self Care | Payer: 59 | Source: Ambulatory Visit | Attending: Family Medicine | Admitting: Family Medicine

## 2022-08-01 DIAGNOSIS — Z1231 Encounter for screening mammogram for malignant neoplasm of breast: Secondary | ICD-10-CM

## 2022-08-08 ENCOUNTER — Telehealth: Payer: Self-pay | Admitting: *Deleted

## 2022-08-08 NOTE — Progress Notes (Signed)
  Care Coordination Note  08/08/2022 Name: SAVANNHA WELLE MRN: [id number] DOB: August 28, 1955  Vangie Bicker Diebold is a 67 y.o. year old female who is a primary care patient of Cox, Kirsten, MD and is actively engaged with the care management team. I reached out to [employer] by phone today to assist with re-scheduling a follow up visit with the RN Case Manager  Follow up plan: Unsuccessful telephone outreach attempt made. A HIPAA compliant phone message was left for the patient providing contact information and requesting a return call.   Julian Hy, [place] Direct Dial: [phone]

## 2022-08-10 ENCOUNTER — Encounter: Payer: Self-pay | Admitting: Family Medicine

## 2022-08-16 ENCOUNTER — Other Ambulatory Visit: Payer: Self-pay

## 2022-08-16 MED ORDER — SPIRIVA RESPIMAT 2.5 MCG/ACT IN AERS: INHALATION_SPRAY | RESPIRATORY_TRACT | 3 refills | Status: DC

## 2022-08-21 ENCOUNTER — Encounter: Payer: Self-pay | Admitting: Family Medicine

## 2022-08-21 ENCOUNTER — Other Ambulatory Visit: Payer: Self-pay | Admitting: Family Medicine

## 2022-08-21 DIAGNOSIS — Z9689 Presence of other specified functional implants: Secondary | ICD-10-CM | POA: Diagnosis not present

## 2022-08-21 DIAGNOSIS — M961 Postlaminectomy syndrome, not elsewhere classified: Secondary | ICD-10-CM | POA: Diagnosis not present

## 2022-08-21 DIAGNOSIS — M5134 Other intervertebral disc degeneration, thoracic region: Secondary | ICD-10-CM | POA: Diagnosis not present

## 2022-08-21 DIAGNOSIS — Z981 Arthrodesis status: Secondary | ICD-10-CM | POA: Diagnosis not present

## 2022-08-22 ENCOUNTER — Encounter: Payer: Self-pay | Admitting: Family Medicine

## 2022-08-22 ENCOUNTER — Other Ambulatory Visit: Payer: Self-pay | Admitting: Family Medicine

## 2022-08-22 ENCOUNTER — Ambulatory Visit (INDEPENDENT_AMBULATORY_CARE_PROVIDER_SITE_OTHER): Payer: 59 | Admitting: Family Medicine

## 2022-08-22 VITALS — BP 130/70 | HR 84 | Temp 97.0°F | Resp 16 | Ht 60.0 in | Wt 145.0 lb

## 2022-08-22 DIAGNOSIS — J441 Chronic obstructive pulmonary disease with (acute) exacerbation: Secondary | ICD-10-CM

## 2022-08-22 DIAGNOSIS — G894 Chronic pain syndrome: Secondary | ICD-10-CM

## 2022-08-22 DIAGNOSIS — J018 Other acute sinusitis: Secondary | ICD-10-CM | POA: Diagnosis not present

## 2022-08-22 LAB — POC COVID19 BINAXNOW: SARS Coronavirus 2 Ag: NEGATIVE

## 2022-08-22 MED ORDER — PROMETHAZINE-DM 6.25-15 MG/5ML PO SYRP: 5.0000 mL | ORAL_SOLUTION | Freq: Four times a day (QID) | ORAL | 0 refills | Status: DC | PRN

## 2022-08-22 MED ORDER — DOXYCYCLINE HYCLATE 100 MG PO TABS: 100.0000 mg | ORAL_TABLET | Freq: Two times a day (BID) | ORAL | 0 refills | Status: DC

## 2022-08-22 MED ORDER — TRIAMCINOLONE ACETONIDE 40 MG/ML IJ SUSP
80.0000 mg | Freq: Once | INTRAMUSCULAR | Status: AC
  Administered 2022-08-22: 80 mg via INTRAMUSCULAR

## 2022-08-22 MED ORDER — CEFTRIAXONE SODIUM 1 G IJ SOLR
1.0000 g | Freq: Once | INTRAMUSCULAR | Status: AC
  Administered 2022-08-22: 1 g via INTRAMUSCULAR

## 2022-08-22 NOTE — Patient Instructions (Addendum)
Rx: doxycycline 100 mg twice daily x 10 days.  Given kenalog 80 mg and rocephin 1 gm shots.  Recommend push fluids.   Use albuterol inhaler 2 puffs four times a day over the next 48 hours, then go to four times a day as needed wheezing, shortness of breath.

## 2022-08-22 NOTE — Assessment & Plan Note (Signed)
Rx: doxycycline 100 mg twice daily x 10 days.  Given kenalog 80 mg and rocephin 1 gm shots.  Recommend push fluids.   Use albuterol inhaler 2 puffs four times a day over the next 48 hours, then go to four times a day as needed wheezing, shortness of breath.

## 2022-08-22 NOTE — Assessment & Plan Note (Signed)
Refer to physical therapy for evaluation for a scooter.

## 2022-08-22 NOTE — Assessment & Plan Note (Signed)
Rx: doxycycline 100 mg twice daily x 10 days.  Given rocephin 1 gm shots.  Recommend push fluids.   Use albuterol inhaler 2 puffs four times a day over the next 48 hours, then go to four times a day as needed wheezing, shortness of breath.

## 2022-08-22 NOTE — Progress Notes (Signed)
Acute Office Visit  Subjective:    Patient ID: Connie Coffey, female    DOB: 1956/03/08, 67 y.o.   MRN: [id number]  Chief Complaint  Patient presents with   Cough   Sore Throat    HPI: Patient is in today for hoarseness, nasal congestion, cough, shortness of breath, sinus tenderness, and sore throat which started on Sunday after spending several days outside at an antique show.    Patient is limited by chronic pain and emphysema. She had a stroke several years ago. It affected her voice.    Past Medical History:  Diagnosis Date   Adrenal insufficiency (HCC)    Anxiety    Arthritis    Carpal tunnel syndrome    Both hands, worse in right   Chronic constipation    COPD (chronic obstructive pulmonary disease) (HCC)    CVA (cerebral vascular accident) ([place])    Depression    Facet syndrome    Fibromyalgia    GERD (gastroesophageal reflux disease)    Heart murmur    Hypercholesteremia    IBS (irritable bowel syndrome)    Ischemic stroke ([place])    07/19/2004   Lumbar radiculopathy    Osteoporosis    Radiculopathy, lumbar region 05/02/2021   RLS (restless legs syndrome)    Spinal stenosis    TMJ (temporomandibular joint disorder)    Trochanteric bursitis of both hips    Vascular malformation    spinal cord    Past Surgical History:  Procedure Laterality Date   APPENDECTOMY     CARPAL TUNNEL RELEASE Right 12/13/2017   surgery   CHOLECYSTECTOMY     COLONOSCOPY  05/06/2017   Colonic polyps status post polypectomy. Interan land external hemorrhoids.    ESOPHAGOGASTRODUODENOSCOPY  04/28/2007   Mild gastrtiis. Otherwise normal EGD.    HEMORROIDECTOMY     LAMINECTOMY AND MICRODISCECTOMY LUMBAR SPINE  01/03/2005   NECK SURGERY     x2    OTHER SURGICAL HISTORY  04/24/2004   anterior cervical discectomy and fusion at C5-C6   SPINAL CORD STIMULATOR INSERTION N/A 07/21/2021   Procedure: Permanant Spinal cord stimulator placement leads and battery under fluroscopy;  Surgeon:  Reece Agar, MD;  Location: [facility];  Service: Neurosurgery;  Laterality: N/A;   TONSILLECTOMY     TOTAL ABDOMINAL HYSTERECTOMY      Family History  Problem Relation Age of Onset   Supraventricular tachycardia Mother    Dementia Mother    Hyperlipidemia Mother    Hypertension Mother    Osteoarthritis Mother    Osteoporosis Mother    Breast cancer Other    Breast cancer Other    Colon cancer Neg Hx    Esophageal cancer Neg Hx     Social History   Socioeconomic History   Marital status: Divorced    Spouse name: Not on file   Number of children: 1   Years of education: Not on file   Highest education level: Not on file  Occupational History   Occupation: Disabled  Tobacco Use   Smoking status: Every Day    Packs/day: .5    Types: Cigarettes   Smokeless tobacco: Never  Vaping Use   Vaping Use: Never used  Substance and Sexual Activity   Alcohol use: Not Currently    Comment: quit 19 years   Drug use: Never   Sexual activity: Not Currently  Other Topics Concern   Not on file  Social History Narrative   Disabled son lives with  her sometimes and his father sometimes.  Laquita is working hard to have a relationship with her son's father and his wife.  They help her when needed.   Social Determinants of Health   Financial Resource Strain: Low Risk  (11/15/2021)   Overall Financial Resource Strain (CARDIA)    Difficulty of Paying Living Expenses: Not hard at all  Food Insecurity: No Food Insecurity (07/18/2022)   Hunger Vital Sign    Worried About Running Out of Food in the Last Year: Never true    Ran Out of Food in the Last Year: Never true  Transportation Needs: No Transportation Needs (07/18/2022)   PRAPARE - Hydrologist (Medical): No    Lack of Transportation (Non-Medical): No  Physical Activity: Inactive (07/18/2022)   Exercise Vital Sign    Days of Exercise per Week: 0 days    Minutes of Exercise per Session: 0 min  Stress: No  Stress Concern Present (07/18/2022)   [place]    Feeling of Stress : Only a little  Social Connections: Socially Isolated (07/18/2022)   Social Connection and Isolation Panel [NHANES]    Frequency of Communication with Friends and Family: Three times a week    Frequency of Social Gatherings with Friends and Family: Three times a week    Attends Religious Services: Never    Active Member of Clubs or Organizations: No    Attends Archivist Meetings: Never    Marital Status: Divorced  Human resources officer Violence: Not At Risk (07/18/2022)   Humiliation, Afraid, Rape, and Kick questionnaire    Fear of Current or Ex-Partner: No    Emotionally Abused: No    Physically Abused: No    Sexually Abused: No    Outpatient Medications Prior to Visit  Medication Sig Dispense Refill   Abaloparatide (TYMLOS) 3120 MCG/1.56ML SOPN Inject 80 mcg into the skin daily. 4.68 mL 1   albuterol (VENTOLIN HFA) 108 (90 Base) MCG/ACT inhaler Inhale 2 puffs into the lungs every 4 (four) hours as needed. 18 g 2   ARIPiprazole (ABILIFY) 5 MG tablet TAKE 1 TABLET (5 MG TOTAL) BY MOUTH DAILY. 90 tablet 1   atorvastatin (LIPITOR) 40 MG tablet Take 1 tablet (40 mg total) by mouth daily. 90 tablet 3   Azelastine-Fluticasone 137-50 MCG/ACT SUSP Place 1 spray into the nose every 12 (twelve) hours. 69 g 3   bethanechol (URECHOLINE) 25 MG tablet Take 1 tablet (25 mg total) by mouth in the morning and at bedtime. 180 tablet 1   busPIRone (BUSPAR) 30 MG tablet TAKE 1 TABLET BY MOUTH TWICE A DAY 180 tablet 1   ciclopirox (PENLAC) 8 % solution Apply topically at bedtime. Apply over nail and surrounding skin. Apply daily over previous coat. After seven (7) days, may remove with alcohol and continue cycle. 6.6 mL 0   cyclobenzaprine (FLEXERIL) 10 MG tablet TAKE 1 TABLET BY MOUTH 3  TIMES DAILY 270 tablet 1   DULoxetine (CYMBALTA) 60 MG capsule TAKE 1 CAPSULE BY  MOUTH  DAILY 90 capsule 3   fenofibrate 160 MG tablet Take 1 tablet (160 mg total) by mouth daily. 90 tablet 0   gabapentin (NEURONTIN) 600 MG tablet TAKE 1 TABLET BY MOUTH 4 TIMES  DAILY 400 tablet 2   hydrocortisone (CORTEF) 10 MG tablet Take 10 mg by mouth 2 (two) times daily.     hydrOXYzine (VISTARIL) 25 MG capsule TAKE 1 CAPSULE (25  MG TOTAL) BY MOUTH EVERY 8 (EIGHT) HOURS AS NEEDED FOR ANXIETY OR ITCHING. 270 capsule 0   Insulin Pen Needle (GLOBAL EASY GLIDE PEN NEEDLES) 32G X 4 MM MISC Use one needle daily as directed     levocetirizine (XYZAL) 5 MG tablet TAKE 1 TABLET BY MOUTH EVERY DAY IN THE EVENING 90 tablet 3   levothyroxine (SYNTHROID) 75 MCG tablet TAKE 1 TABLET BY MOUTH EVERY DAY BEFORE BREAKFAST 90 tablet 0   lubiprostone (AMITIZA) 8 MCG capsule TAKE 1 CAPSULE BY MOUTH TWICE  DAILY WITH MEALS 120 capsule 5   montelukast (SINGULAIR) 10 MG tablet TAKE 1 TABLET BY MOUTH  DAILY 90 tablet 3   Multiple Vitamin (MULTI-VITAMINS) TABS Take 1 tablet by mouth daily.     olopatadine (PATANOL) 0.1 % ophthalmic solution Place 1 drop into both eyes daily as needed for allergies.     oxyCODONE-acetaminophen (PERCOCET) 10-325 MG tablet TAKE 1 TAB EVERY 4 HOURS FOR 5 DAYS THEN 1 TAB TWICE A DAY AS NEEDED FOR SEVERE BREAKTHROUGH PAIN  0   pantoprazole (PROTONIX) 40 MG tablet TAKE 1 TABLET BY MOUTH TWICE A DAY 180 tablet 1   Propylene Glycol (SYSTANE BALANCE) 0.6 % SOLN Place 1 drop into both eyes daily as needed (dry eyes).     Tiotropium Bromide Monohydrate (SPIRIVA RESPIMAT) 2.5 MCG/ACT AERS USE 1 INHALATION BY MOUTH  TWICE DAILY 12 g 3   Vitamin D, Ergocalciferol, (DRISDOL) 50000 units CAPS capsule Take 50,000 Units by mouth every Wednesday.     primidone (MYSOLINE) 50 MG tablet TAKE 1 TABLET BY MOUTH TWICE A DAY 180 tablet 1   promethazine-dextromethorphan (PROMETHAZINE-DM) 6.25-15 MG/5ML syrup Take 5 mLs by mouth 2 (two) times daily as needed for cough. 118 mL 0   No facility-administered  medications prior to visit.    Allergies  Allergen Reactions   Beta Vulgaris Anaphylaxis    Beets    Penicillins Shortness Of Breath   Celecoxib Other (See Comments)    "because of stomach ulcers"   Chantix [Varenicline]     Makes pt sick on her stomach    Dog Epithelium (Canis Lupus Familiaris)     Cats and dogs   Fish Oil Other (See Comments)    hemorrhoids   Gramineae Pollens    Molds & Smuts    Nsaids Other (See Comments)    GI Upset. Ulcers   Other Other (See Comments)    beets  Including Celebrex   Tape Dermatitis    Electrodes caused blistering   Nicotine Nausea Only and Other (See Comments)    Review of Systems  Constitutional:  Positive for fatigue. Negative for chills and fever.  HENT:  Positive for congestion, ear pain, rhinorrhea, sinus pressure, sinus pain, sore throat and voice change.   Respiratory:  Positive for cough and shortness of breath.   Neurological:  Positive for light-headedness and headaches.       Objective:        08/22/2022   11:42 AM 08/22/2022   11:35 AM 07/16/2022   10:57 AM  Vitals with BMI  Height  5\' 0"  5\' 0"   Weight  145 lbs 141 lbs  BMI  [id number]  Systolic [id number]  Diastolic 70 72 78  Pulse  84 104    No data found.   Physical Exam Vitals reviewed.  Constitutional:      Appearance: Normal appearance. She is normal weight.  HENT:     Right Ear: Tympanic membrane  normal.     Left Ear: Tympanic membrane normal.     Nose: Congestion present.     Mouth/Throat:     Pharynx: Posterior oropharyngeal erythema present. No oropharyngeal exudate.  Neck:     Vascular: No carotid bruit.  Cardiovascular:     Rate and Rhythm: Normal rate and regular rhythm.     Heart sounds: Normal heart sounds.  Pulmonary:     Effort: Pulmonary effort is normal. No respiratory distress.     Breath sounds: Wheezing (upper lobes bilaterally) present.  Abdominal:     General: Abdomen is flat. Bowel sounds are normal.     Palpations:  Abdomen is soft.     Tenderness: There is no abdominal tenderness.  Lymphadenopathy:     Cervical: Cervical adenopathy present.  Neurological:     Mental Status: She is alert and oriented to person, place, and time.  Psychiatric:        Mood and Affect: Mood normal.        Behavior: Behavior normal.     Health Maintenance Due  Topic Date Due   Zoster Vaccines- Shingrix (1 of 2) Never done   COVID-19 Vaccine (5 - 2023-24 season) 05/14/2022    There are no preventive care reminders to display for this patient.   Lab Results  Component Value Date   TSH 0.896 01/23/2022   Lab Results  Component Value Date   WBC 10.8 07/16/2022   HGB 14.4 07/16/2022   HCT 42.7 07/16/2022   MCV 85 07/16/2022   PLT 585 (H) 07/16/2022   Lab Results  Component Value Date   NA 140 07/16/2022   K 5.1 07/16/2022   CO2 26 07/16/2022   GLUCOSE 92 07/16/2022   BUN 14 07/16/2022   CREATININE 0.78 07/16/2022   BILITOT 0.2 07/16/2022   ALKPHOS 50 07/16/2022   AST 19 07/16/2022   ALT 16 07/16/2022   PROT 7.1 07/16/2022   ALBUMIN 5.1 (H) 07/16/2022   CALCIUM 10.0 07/16/2022   ANIONGAP 8 07/21/2021   EGFR 83 07/16/2022   Lab Results  Component Value Date   CHOL 189 07/16/2022   Lab Results  Component Value Date   HDL 102 07/16/2022   Lab Results  Component Value Date   LDLCALC 70 07/16/2022   Lab Results  Component Value Date   TRIG 97 07/16/2022   Lab Results  Component Value Date   CHOLHDL 1.9 07/16/2022   No results found for: "HGBA1C"     Assessment & Plan:  Acute non-recurrent sinusitis of other sinus Assessment & Plan: Rx: doxycycline 100 mg twice daily x 10 days.  Given rocephin 1 gm shots.  Recommend push fluids.   Use albuterol inhaler 2 puffs four times a day over the next 48 hours, then go to four times a day as needed wheezing, shortness of breath.   Orders: -     POC COVID-19 BinaxNow -     cefTRIAXone Sodium -     Doxycycline Hyclate; Take 1 tablet (100  mg total) by mouth 2 (two) times daily.  Dispense: 20 tablet; Refill: 0 -     Promethazine-DM; Take 5 mLs by mouth 4 (four) times daily as needed for cough.  Dispense: 118 mL; Refill: 0  COPD exacerbation (HCC) Assessment & Plan: Rx: doxycycline 100 mg twice daily x 10 days.  Given kenalog 80 mg and rocephin 1 gm shots.  Recommend push fluids.   Use albuterol inhaler 2 puffs four times a day over  the next 48 hours, then go to four times a day as needed wheezing, shortness of breath.   Orders: -     POC COVID-19 BinaxNow -     Triamcinolone Acetonide  Chronic pain syndrome Assessment & Plan: Refer to physical therapy for evaluation for a scooter.   Orders: -     Ambulatory referral to Physical Therapy     Meds ordered this encounter  Medications   triamcinolone acetonide (KENALOG-40) injection 80 mg   cefTRIAXone (ROCEPHIN) injection 1 g   doxycycline (VIBRA-TABS) 100 MG tablet    Sig: Take 1 tablet (100 mg total) by mouth 2 (two) times daily.    Dispense:  20 tablet    Refill:  0   promethazine-dextromethorphan (PROMETHAZINE-DM) 6.25-15 MG/5ML syrup    Sig: Take 5 mLs by mouth 4 (four) times daily as needed for cough.    Dispense:  118 mL    Refill:  0    Orders Placed This Encounter  Procedures   Ambulatory referral to Physical Therapy   POC COVID-19     Follow-up: Return if symptoms worsen or fail to improve.  An After Visit Summary was printed and given to the patient.  [name], MD [facility] [phone]

## 2022-08-28 NOTE — Progress Notes (Signed)
  Care Coordination Note  08/28/2022 Name: [name] MRN: [id number] DOB: June 15, 1955  Connie Coffey is a 67 y.o. year old female who is a primary care patient of Cox, Kirsten, MD and is actively engaged with the care management team. I reached out to [name] by phone today to assist with re-scheduling a follow up visit with the RN Case Manager  Follow up plan: Telephone appointment with care management team member scheduled for: 08/30/2022  Julian Hy, [place] Direct Dial: [phone]

## 2022-08-30 ENCOUNTER — Ambulatory Visit: Payer: Self-pay

## 2022-08-30 NOTE — Patient Outreach (Signed)
  Care Coordination   Follow Up Visit Note   08/30/2022 Name: Connie Coffey MRN: [id number] DOB: March 26, 1956  Connie Coffey is a 67 y.o. year old female who sees Cox, Kirsten, MD for primary care. I spoke with  Connie Coffey by phone today.  What matters to the patients health and wellness today?  Reports pain is about the same. Reports Dr. Saintclair Halsted stated that she would have to live with it. Patient wants to get an electric wheelchair.  Reports that she is not able to go out and about due to her pain and inability to walk.  Patient reports that she talked to MD about this. Patient reports that she is gathering the information that she needs and will talk to MD.  Reports that she has an electric wheelchair that is 69 years old that she takes to the grocery store.  Reports the wheelchair that she has will not work outside. Patient reports that it just spins.  Reviewed recent fall outside due to the gumball's on the ground.  Reports she is eating well.    Goals Addressed               This Visit's Progress     Chronic back pain and lack of energy (pt-stated)            Interventions Today    Flowsheet Row Most Recent Value  Chronic Disease   Chronic disease during today's visit Other  [Chronic back pain]  General Interventions   General Interventions Discussed/Reviewed General Interventions Discussed  Exercise Interventions   Exercise Discussed/Reviewed Physical Activity, Assistive device use and maintanence  [reviewed patients request for a new outdoor electric chair.]  Nutrition Interventions   Nutrition Discussed/Reviewed Nutrition Discussed  Safety Interventions   Safety Discussed/Reviewed Fall Risk  [reviewed importance of fall prevention.]              SDOH assessments and interventions completed:  No     Care Coordination Interventions:  Yes, provided   Follow up plan: Follow up call scheduled for 10/31/2022    Encounter Outcome:  Pt. Visit Completed   Tomasa Rand, RN,  BSN, CEN [place] Coordinator [phone]

## 2022-09-03 DIAGNOSIS — N390 Urinary tract infection, site not specified: Secondary | ICD-10-CM | POA: Diagnosis not present

## 2022-09-03 DIAGNOSIS — R339 Retention of urine, unspecified: Secondary | ICD-10-CM | POA: Diagnosis not present

## 2022-09-06 DIAGNOSIS — M5414 Radiculopathy, thoracic region: Secondary | ICD-10-CM | POA: Diagnosis not present

## 2022-09-13 ENCOUNTER — Ambulatory Visit (INDEPENDENT_AMBULATORY_CARE_PROVIDER_SITE_OTHER): Payer: 59 | Admitting: Family Medicine

## 2022-09-13 VITALS — BP 126/74 | HR 102 | Temp 97.6°F | Ht 60.0 in | Wt 137.0 lb

## 2022-09-13 DIAGNOSIS — J301 Allergic rhinitis due to pollen: Secondary | ICD-10-CM

## 2022-09-13 DIAGNOSIS — J441 Chronic obstructive pulmonary disease with (acute) exacerbation: Secondary | ICD-10-CM

## 2022-09-13 MED ORDER — TRIAMCINOLONE ACETONIDE 40 MG/ML IJ SUSP
80.0000 mg | Freq: Once | INTRAMUSCULAR | Status: AC
  Administered 2022-09-13: 80 mg via INTRAMUSCULAR

## 2022-09-13 MED ORDER — PREDNISONE 50 MG PO TABS: 50.0000 mg | ORAL_TABLET | Freq: Every day | ORAL | 0 refills | Status: DC

## 2022-09-13 MED ORDER — TRIAMCINOLONE ACETONIDE 40 MG/ML IJ SUSP: 80.0000 mg | Freq: Once | INTRAMUSCULAR | Status: DC

## 2022-09-13 MED ORDER — ALBUTEROL SULFATE HFA 108 (90 BASE) MCG/ACT IN AERS: 2.0000 | INHALATION_SPRAY | RESPIRATORY_TRACT | 3 refills | Status: DC | PRN

## 2022-09-13 NOTE — Progress Notes (Unsigned)
Acute Office Visit  Subjective:    Patient ID: Connie Coffey, female    DOB: [birth year], 67 y.o.   MRN: [id number]  Chief Complaint  Patient presents with   Shortness of Breath    HPI: Patient is in today for shob x 1 month states she has used her inhaler which has not helped much. Complaining of cough. No fevers, chills.   Past Medical History:  Diagnosis Date   Adrenal insufficiency    Anxiety    Arthritis    Carpal tunnel syndrome    Both hands, worse in right   Chronic constipation    COPD (chronic obstructive pulmonary disease)    CVA (cerebral vascular accident)    Depression    Facet syndrome    Fibromyalgia    GERD (gastroesophageal reflux disease)    Heart murmur    Hypercholesteremia    IBS (irritable bowel syndrome)    Ischemic stroke    07/19/2004   Lumbar radiculopathy    Osteoporosis    Radiculopathy, lumbar region 05/02/2021   RLS (restless legs syndrome)    Spinal stenosis    TMJ (temporomandibular joint disorder)    Trochanteric bursitis of both hips    Vascular malformation    spinal cord    Past Surgical History:  Procedure Laterality Date   APPENDECTOMY     CARPAL TUNNEL RELEASE Right 12/13/2017   surgery   CHOLECYSTECTOMY     COLONOSCOPY  05/06/2017   Colonic polyps status post polypectomy. Interan land external hemorrhoids.    ESOPHAGOGASTRODUODENOSCOPY  04/28/2007   Mild gastrtiis. Otherwise normal EGD.    HEMORROIDECTOMY     LAMINECTOMY AND MICRODISCECTOMY LUMBAR SPINE  01/03/2005   NECK SURGERY     x2    OTHER SURGICAL HISTORY  04/24/2004   anterior cervical discectomy and fusion at C5-C6   SPINAL CORD STIMULATOR INSERTION N/A 07/21/2021   Procedure: Permanant Spinal cord stimulator placement leads and battery under fluroscopy;  Surgeon: Renaldo Fiddler, MD;  Location[facility] OR;  Service: Neurosurgery;  Laterality: N/A;   TONSILLECTOMY     TOTAL ABDOMINAL HYSTERECTOMY      Family History  Problem Relation Age of Onset    Supraventricular tachycardia Mother    Dementia Mother    Hyperlipidemia Mother    Hypertension Mother    Osteoarthritis Mother    Osteoporosis Mother    Breast cancer Other    Breast cancer Other    Colon cancer Neg Hx    Esophageal cancer Neg Hx     Social History   Socioeconomic History   Marital status: Divorced    Spouse name: Not on file   Number of children: 1   Years of education: Not on file   Highest education level: Not on file  Occupational History   Occupation: Disabled  Tobacco Use   Smoking status: Every Day    Packs/day: .5    Types: Cigarettes   Smokeless tobacco: Never  Vaping Use   Vaping Use: Never used  Substance and Sexual Activity   Alcohol use: Not Currently    Comment: quit 19 years   Drug use: Never   Sexual activity: Not Currently  Other Topics Concern   Not on file  Social History Narrative   Disabled son lives with her sometimes and his father sometimes.  Connie Coffey is working hard to have a relationship with her son's father and his wife.  They help her when needed.   Social Determinants of  Health   Financial Resource Strain: Low Risk  (11/15/2021)   Overall Financial Resource Strain (CARDIA)    Difficulty of Paying Living Expenses: Not hard at all  Food Insecurity: No Food Insecurity (07/18/2022)   Hunger Vital Sign    Worried About Running Out of Food in the Last Year: Never true    Ran Out of Food in the Last Year: Never true  Transportation Needs: No Transportation Needs (07/18/2022)   PRAPARE - Administrator, Civil Service (Medical): No    Lack of Transportation (Non-Medical): No  Physical Activity: Inactive (07/18/2022)   Exercise Vital Sign    Days of Exercise per Week: 0 days    Minutes of Exercise per Session: 0 min  Stress: No Stress Concern Present (07/18/2022)   [employer] of Occupational Health - Occupational Stress Questionnaire    Feeling of Stress : Only a little  Social Connections: Socially Isolated  (07/18/2022)   Social Connection and Isolation Panel [NHANES]    Frequency of Communication with Friends and Family: Three times a week    Frequency of Social Gatherings with Friends and Family: Three times a week    Attends Religious Services: Never    Active Member of Clubs or Organizations: No    Attends Banker Meetings: Never    Marital Status: Divorced  Catering manager Violence: Not At Risk (07/18/2022)   Humiliation, Afraid, Rape, and Kick questionnaire    Fear of Current or Ex-Partner: No    Emotionally Abused: No    Physically Abused: No    Sexually Abused: No    Outpatient Medications Prior to Visit  Medication Sig Dispense Refill   Abaloparatide (TYMLOS) 3120 MCG/1.56ML SOPN Inject 80 mcg into the skin daily. 4.68 mL 1   ARIPiprazole (ABILIFY) 5 MG tablet TAKE 1 TABLET (5 MG TOTAL) BY MOUTH DAILY. 90 tablet 1   atorvastatin (LIPITOR) 40 MG tablet Take 1 tablet (40 mg total) by mouth daily. 90 tablet 3   Azelastine-Fluticasone 137-50 MCG/ACT SUSP Place 1 spray into the nose every 12 (twelve) hours. 69 g 3   bethanechol (URECHOLINE) 25 MG tablet Take 1 tablet (25 mg total) by mouth in the morning and at bedtime. 180 tablet 1   busPIRone (BUSPAR) 30 MG tablet TAKE 1 TABLET BY MOUTH TWICE A DAY 180 tablet 1   ciclopirox (PENLAC) 8 % solution Apply topically at bedtime. Apply over nail and surrounding skin. Apply daily over previous coat. After seven (7) days, may remove with alcohol and continue cycle. 6.6 mL 0   cyclobenzaprine (FLEXERIL) 10 MG tablet TAKE 1 TABLET BY MOUTH 3  TIMES DAILY 270 tablet 1   doxycycline (VIBRA-TABS) 100 MG tablet Take 1 tablet (100 mg total) by mouth 2 (two) times daily. 20 tablet 0   DULoxetine (CYMBALTA) 60 MG capsule TAKE 1 CAPSULE BY MOUTH  DAILY 90 capsule 3   fenofibrate 160 MG tablet Take 1 tablet (160 mg total) by mouth daily. 90 tablet 0   gabapentin (NEURONTIN) 600 MG tablet TAKE 1 TABLET BY MOUTH 4 TIMES  DAILY 400 tablet 2    hydrocortisone (CORTEF) 10 MG tablet Take 10 mg by mouth 2 (two) times daily.     hydrOXYzine (VISTARIL) 25 MG capsule TAKE 1 CAPSULE (25 MG TOTAL) BY MOUTH EVERY 8 (EIGHT) HOURS AS NEEDED FOR ANXIETY OR ITCHING. 270 capsule 0   Insulin Pen Needle (GLOBAL EASY GLIDE PEN NEEDLES) 32G X 4 MM MISC Use one needle daily  as directed     levocetirizine (XYZAL) 5 MG tablet TAKE 1 TABLET BY MOUTH EVERY DAY IN THE EVENING 90 tablet 3   levothyroxine (SYNTHROID) 75 MCG tablet TAKE 1 TABLET BY MOUTH EVERY DAY BEFORE BREAKFAST 90 tablet 0   lubiprostone (AMITIZA) 8 MCG capsule TAKE 1 CAPSULE BY MOUTH TWICE  DAILY WITH MEALS 120 capsule 5   montelukast (SINGULAIR) 10 MG tablet TAKE 1 TABLET BY MOUTH  DAILY 90 tablet 3   Multiple Vitamin (MULTI-VITAMINS) TABS Take 1 tablet by mouth daily.     olopatadine (PATANOL) 0.1 % ophthalmic solution Place 1 drop into both eyes daily as needed for allergies.     oxyCODONE-acetaminophen (PERCOCET) 10-325 MG tablet TAKE 1 TAB EVERY 4 HOURS FOR 5 DAYS THEN 1 TAB TWICE A DAY AS NEEDED FOR SEVERE BREAKTHROUGH PAIN  0   pantoprazole (PROTONIX) 40 MG tablet TAKE 1 TABLET BY MOUTH TWICE A DAY 180 tablet 1   primidone (MYSOLINE) 50 MG tablet TAKE 1 TABLET BY MOUTH TWICE A DAY 180 tablet 1   promethazine-dextromethorphan (PROMETHAZINE-DM) 6.25-15 MG/5ML syrup Take 5 mLs by mouth 4 (four) times daily as needed for cough. 118 mL 0   Propylene Glycol (SYSTANE BALANCE) 0.6 % SOLN Place 1 drop into both eyes daily as needed (dry eyes).     Tiotropium Bromide Monohydrate (SPIRIVA RESPIMAT) 2.5 MCG/ACT AERS USE 1 INHALATION BY MOUTH  TWICE DAILY 12 g 3   Vitamin D, Ergocalciferol, (DRISDOL) 50000 units CAPS capsule Take 50,000 Units by mouth every Wednesday.     albuterol (VENTOLIN HFA) 108 (90 Base) MCG/ACT inhaler Inhale 2 puffs into the lungs every 4 (four) hours as needed. 18 g 2   No facility-administered medications prior to visit.    Allergies  Allergen Reactions   Beta Vulgaris  Anaphylaxis    Beets    Penicillins Shortness Of Breath   Celecoxib Other (See Comments)    "because of stomach ulcers"   Chantix [Varenicline]     Makes pt sick on her stomach    Dog Epithelium (Canis Lupus Familiaris)     Cats and dogs   Fish Oil Other (See Comments)    hemorrhoids   Gramineae Pollens    Molds & Smuts    Nsaids Other (See Comments)    GI Upset. Ulcers   Other Other (See Comments)    beets  Including Celebrex   Tape Dermatitis    Electrodes caused blistering   Nicotine Nausea Only and Other (See Comments)    Review of Systems  Constitutional:  Positive for fever. Negative for chills.  HENT:  Negative for congestion.   Respiratory:  Positive for cough and shortness of breath.   Cardiovascular:  Negative for chest pain.       Objective:        09/13/2022    4:50 PM 08/22/2022   11:42 AM 08/22/2022   11:35 AM  Vitals with BMI  Height     Weight 137 lbs  145 lbs  BMI 26.76  28.32  Systolic 126 130 161  Diastolic 74 70 72  Pulse 102  84    No data found.   Physical Exam Vitals reviewed.  Constitutional:      Appearance: Normal appearance. She is well-developed.  HENT:     Right Ear: Tympanic membrane, ear canal and external ear normal.     Left Ear: Tympanic membrane, ear canal and external ear normal.     Nose: Congestion  present.     Mouth/Throat:     Pharynx: Oropharynx is clear. No oropharyngeal exudate or posterior oropharyngeal erythema.  Cardiovascular:     Rate and Rhythm: Normal rate and regular rhythm.     Heart sounds: Normal heart sounds. No murmur heard. Pulmonary:     Effort: Pulmonary effort is normal. No respiratory distress.     Breath sounds: Wheezing (diffuse expiratory) present.  Lymphadenopathy:     Cervical: No cervical adenopathy.  Neurological:     Mental Status: She is alert and oriented to person, place, and time.  Psychiatric:        Mood and Affect: Mood normal.        Behavior: Behavior normal.      Health Maintenance Due  Topic Date Due   Zoster Vaccines- Shingrix (1 of 2) Never done   COVID-19 Vaccine (5 - 2023-24 season) 05/14/2022    There are no preventive care reminders to display for this patient.   Lab Results  Component Value Date   TSH 0.896 01/23/2022   Lab Results  Component Value Date   WBC 10.8 07/16/2022   HGB 14.4 07/16/2022   HCT 42.7 07/16/2022   MCV 85 07/16/2022   PLT 585 (H) 07/16/2022   Lab Results  Component Value Date   NA 140 07/16/2022   K 5.1 07/16/2022   CO2 26 07/16/2022   GLUCOSE 92 07/16/2022   BUN 14 07/16/2022   CREATININE 0.78 07/16/2022   BILITOT 0.2 07/16/2022   ALKPHOS 50 07/16/2022   AST 19 07/16/2022   ALT 16 07/16/2022   PROT 7.1 07/16/2022   ALBUMIN 5.1 (H) 07/16/2022   CALCIUM 10.0 07/16/2022   ANIONGAP 8 07/21/2021   EGFR 83 07/16/2022   Lab Results  Component Value Date   CHOL 189 07/16/2022   Lab Results  Component Value Date   HDL 102 07/16/2022   Lab Results  Component Value Date   LDLCALC 70 07/16/2022   Lab Results  Component Value Date   TRIG 97 07/16/2022   Lab Results  Component Value Date   CHOLHDL 1.9 07/16/2022   No results found for: "HGBA1C"     Assessment & Plan:  COPD exacerbation Assessment & Plan: Kenalog 80 mg  Sent Prednisone.  Orders: -     Albuterol Sulfate HFA; Inhale 2 puffs into the lungs every 4 (four) hours as needed.  Dispense: 54 g; Refill: 3 -     Triamcinolone Acetonide -     predniSONE; Take 1 tablet (50 mg total) by mouth daily with breakfast.  Dispense: 5 tablet; Refill: 0  Seasonal allergic rhinitis due to pollen Assessment & Plan: Ordered Kenalog 80 mg IM #1  Orders: -     Triamcinolone Acetonide     Meds ordered this encounter  Medications   DISCONTD: triamcinolone acetonide (KENALOG-40) injection 80 mg   albuterol (VENTOLIN HFA) 108 (90 Base) MCG/ACT inhaler    Sig: Inhale 2 puffs into the lungs every 4 (four) hours as needed.     Dispense:  54 g    Refill:  3    90 day rx   triamcinolone acetonide (KENALOG-40) injection 80 mg   predniSONE (DELTASONE) 50 MG tablet    Sig: Take 1 tablet (50 mg total) by mouth daily with breakfast.    Dispense:  5 tablet    Refill:  0    No orders of the defined types were placed in this encounter.    Follow-up: Return if  symptoms worsen or fail to improve.  An After Visit Summary was printed and given to the patient.   Clayborn Bigness I Leal-Borjas,acting as a scribe for Blane Ohara, MD.,have documented all relevant documentation on the behalf of Blane Ohara, MD,as directed by  Blane Ohara, MD while in the presence of Blane Ohara, MD.    Blane Ohara, MD [facility] [phone]

## 2022-09-13 NOTE — Assessment & Plan Note (Addendum)
Kenalog 80 mg  Sent Prednisone.

## 2022-09-15 NOTE — Assessment & Plan Note (Signed)
Ordered Kenalog 80 mg IM #1 

## 2022-09-16 ENCOUNTER — Encounter: Payer: Self-pay | Admitting: Family Medicine

## 2022-09-18 ENCOUNTER — Other Ambulatory Visit: Payer: Self-pay | Admitting: Family Medicine

## 2022-09-19 ENCOUNTER — Other Ambulatory Visit: Payer: Self-pay | Admitting: Family Medicine

## 2022-09-23 ENCOUNTER — Other Ambulatory Visit: Payer: Self-pay | Admitting: Family Medicine

## 2022-10-02 DIAGNOSIS — M5414 Radiculopathy, thoracic region: Secondary | ICD-10-CM | POA: Diagnosis not present

## 2022-10-02 DIAGNOSIS — Z01818 Encounter for other preprocedural examination: Secondary | ICD-10-CM | POA: Diagnosis not present

## 2022-10-02 DIAGNOSIS — H25811 Combined forms of age-related cataract, right eye: Secondary | ICD-10-CM | POA: Diagnosis not present

## 2022-10-02 DIAGNOSIS — H25812 Combined forms of age-related cataract, left eye: Secondary | ICD-10-CM | POA: Diagnosis not present

## 2022-10-02 DIAGNOSIS — H25813 Combined forms of age-related cataract, bilateral: Secondary | ICD-10-CM | POA: Diagnosis not present

## 2022-10-02 DIAGNOSIS — M961 Postlaminectomy syndrome, not elsewhere classified: Secondary | ICD-10-CM | POA: Diagnosis not present

## 2022-10-02 DIAGNOSIS — Z9689 Presence of other specified functional implants: Secondary | ICD-10-CM | POA: Diagnosis not present

## 2022-10-08 ENCOUNTER — Ambulatory Visit (INDEPENDENT_AMBULATORY_CARE_PROVIDER_SITE_OTHER): Payer: 59 | Admitting: Family Medicine

## 2022-10-08 ENCOUNTER — Encounter: Payer: Self-pay | Admitting: Family Medicine

## 2022-10-08 VITALS — BP 110/78 | HR 103 | Temp 97.2°F | Ht 60.0 in | Wt 141.0 lb

## 2022-10-08 DIAGNOSIS — R600 Localized edema: Secondary | ICD-10-CM

## 2022-10-08 MED ORDER — FUROSEMIDE 20 MG PO TABS
20.0000 mg | ORAL_TABLET | Freq: Every day | ORAL | 2 refills | Status: DC
Start: 2022-10-08 — End: 2022-12-12

## 2022-10-08 NOTE — Progress Notes (Signed)
Acute Office Visit  Subjective:    Patient ID: Connie Coffey, female    DOB: Oct 22, 1955, 67 y.o.   MRN: [id number]  Chief Complaint  Patient presents with   Edema    HPI: Patient is in today for BL lower leg edema x 1 week, started in right foot, keeping legs elevated. No shortness of breath with lying down. Has compression stocking but has not used them.  Past Medical History:  Diagnosis Date   Adrenal insufficiency (HCC)    Anxiety    Arthritis    Carpal tunnel syndrome    Both hands, worse in right   Chronic constipation    COPD (chronic obstructive pulmonary disease) (HCC)    CVA (cerebral vascular accident) (HCC)    Depression    Facet syndrome    Fibromyalgia    GERD (gastroesophageal reflux disease)    Heart murmur    Hypercholesteremia    IBS (irritable bowel syndrome)    Ischemic stroke (HCC)    07/19/2004   Lumbar radiculopathy    Osteoporosis    Radiculopathy, lumbar region 05/02/2021   RLS (restless legs syndrome)    Spinal stenosis    TMJ (temporomandibular joint disorder)    Trochanteric bursitis of both hips    Vascular malformation    spinal cord    Past Surgical History:  Procedure Laterality Date   APPENDECTOMY     CARPAL TUNNEL RELEASE Right 12/13/2017   surgery   CHOLECYSTECTOMY     COLONOSCOPY  05/06/2017   Colonic polyps status post polypectomy. Interan land external hemorrhoids.    ESOPHAGOGASTRODUODENOSCOPY  04/28/2007   Mild gastrtiis. Otherwise normal EGD.    HEMORROIDECTOMY     LAMINECTOMY AND MICRODISCECTOMY LUMBAR SPINE  01/03/2005   NECK SURGERY     x2    OTHER SURGICAL HISTORY  04/24/2004   anterior cervical discectomy and fusion at C5-C6   SPINAL CORD STIMULATOR INSERTION N/A 07/21/2021   Procedure: Permanant Spinal cord stimulator placement leads and battery under fluroscopy;  Surgeon: Renaldo Fiddler, MD;  Location: [facility] OR;  Service: Neurosurgery;  Laterality: N/A;   TONSILLECTOMY     TOTAL ABDOMINAL HYSTERECTOMY       Family History  Problem Relation Age of Onset   Supraventricular tachycardia Mother    Dementia Mother    Hyperlipidemia Mother    Hypertension Mother    Osteoarthritis Mother    Osteoporosis Mother    Breast cancer Other    Breast cancer Other    Colon cancer Neg Hx    Esophageal cancer Neg Hx     Social History   Socioeconomic History   Marital status: Divorced    Spouse name: Not on file   Number of children: 1   Years of education: Not on file   Highest education level: Not on file  Occupational History   Occupation: Disabled  Tobacco Use   Smoking status: Every Day    Packs/day: .5    Types: Cigarettes   Smokeless tobacco: Never  Vaping Use   Vaping Use: Never used  Substance and Sexual Activity   Alcohol use: Not Currently    Comment: quit 19 years   Drug use: Never   Sexual activity: Not Currently  Other Topics Concern   Not on file  Social History Narrative   Disabled son lives with her sometimes and his father sometimes.  Connie Coffey is working hard to have a relationship with her son's father and his wife.  They  help her when needed.   Social Determinants of Health   Financial Resource Strain: Low Risk  (11/15/2021)   Overall Financial Resource Strain (CARDIA)    Difficulty of Paying Living Expenses: Not hard at all  Food Insecurity: No Food Insecurity (07/18/2022)   Hunger Vital Sign    Worried About Running Out of Food in the Last Year: Never true    Ran Out of Food in the Last Year: Never true  Transportation Needs: No Transportation Needs (07/18/2022)   PRAPARE - Administrator, Civil Service (Medical): No    Lack of Transportation (Non-Medical): No  Physical Activity: Inactive (07/18/2022)   Exercise Vital Sign    Days of Exercise per Week: 0 days    Minutes of Exercise per Session: 0 min  Stress: No Stress Concern Present (07/18/2022)   [employer] of Occupational Health - Occupational Stress Questionnaire    Feeling of Stress :  Only a little  Social Connections: Socially Isolated (07/18/2022)   Social Connection and Isolation Panel [NHANES]    Frequency of Communication with Friends and Family: Three times a week    Frequency of Social Gatherings with Friends and Family: Three times a week    Attends Religious Services: Never    Active Member of Clubs or Organizations: No    Attends Banker Meetings: Never    Marital Status: Divorced  Catering manager Violence: Not At Risk (07/18/2022)   Humiliation, Afraid, Rape, and Kick questionnaire    Fear of Current or Ex-Partner: No    Emotionally Abused: No    Physically Abused: No    Sexually Abused: No    Outpatient Medications Prior to Visit  Medication Sig Dispense Refill   Abaloparatide (TYMLOS) 3120 MCG/1.56ML SOPN Inject 80 mcg into the skin daily. 4.68 mL 1   albuterol (VENTOLIN HFA) 108 (90 Base) MCG/ACT inhaler Inhale 2 puffs into the lungs every 4 (four) hours as needed. 54 g 3   ARIPiprazole (ABILIFY) 5 MG tablet TAKE 1 TABLET (5 MG TOTAL) BY MOUTH DAILY. 90 tablet 1   atorvastatin (LIPITOR) 40 MG tablet TAKE 1 TABLET BY MOUTH DAILY 90 tablet 1   Azelastine-Fluticasone 137-50 MCG/ACT SUSP Place 1 spray into the nose every 12 (twelve) hours. 69 g 3   bethanechol (URECHOLINE) 25 MG tablet Take 1 tablet (25 mg total) by mouth in the morning and at bedtime. 180 tablet 1   busPIRone (BUSPAR) 30 MG tablet TAKE 1 TABLET BY MOUTH TWICE A DAY 180 tablet 1   cetirizine (ZYRTEC) 10 MG tablet TAKE 1 TABLET BY MOUTH EVERYDAY AT BEDTIME 90 tablet 2   ciclopirox (PENLAC) 8 % solution Apply topically at bedtime. Apply over nail and surrounding skin. Apply daily over previous coat. After seven (7) days, may remove with alcohol and continue cycle. 6.6 mL 0   cyclobenzaprine (FLEXERIL) 10 MG tablet TAKE 1 TABLET BY MOUTH 3  TIMES DAILY 270 tablet 1   doxycycline (VIBRA-TABS) 100 MG tablet Take 1 tablet (100 mg total) by mouth 2 (two) times daily. 20 tablet 0    DULoxetine (CYMBALTA) 60 MG capsule TAKE 1 CAPSULE BY MOUTH  DAILY 90 capsule 3   fenofibrate 160 MG tablet Take 1 tablet (160 mg total) by mouth daily. 90 tablet 0   gabapentin (NEURONTIN) 600 MG tablet TAKE 1 TABLET BY MOUTH 4 TIMES  DAILY 400 tablet 2   hydrocortisone (CORTEF) 10 MG tablet Take 10 mg by mouth 2 (two) times daily.  hydrOXYzine (VISTARIL) 25 MG capsule TAKE 1 CAPSULE (25 MG TOTAL) BY MOUTH EVERY 8 (EIGHT) HOURS AS NEEDED FOR ANXIETY OR ITCHING. 270 capsule 0   Insulin Pen Needle (GLOBAL EASY GLIDE PEN NEEDLES) 32G X 4 MM MISC Use one needle daily as directed     levocetirizine (XYZAL) 5 MG tablet TAKE 1 TABLET BY MOUTH EVERY DAY IN THE EVENING 90 tablet 3   levothyroxine (SYNTHROID) 75 MCG tablet TAKE 1 TABLET BY MOUTH EVERY DAY BEFORE BREAKFAST 90 tablet 2   lubiprostone (AMITIZA) 8 MCG capsule TAKE 1 CAPSULE BY MOUTH TWICE  DAILY WITH MEALS 120 capsule 5   montelukast (SINGULAIR) 10 MG tablet TAKE 1 TABLET BY MOUTH DAILY 90 tablet 1   Multiple Vitamin (MULTI-VITAMINS) TABS Take 1 tablet by mouth daily.     olopatadine (PATANOL) 0.1 % ophthalmic solution Place 1 drop into both eyes daily as needed for allergies.     oxyCODONE-acetaminophen (PERCOCET) 10-325 MG tablet TAKE 1 TAB EVERY 4 HOURS FOR 5 DAYS THEN 1 TAB TWICE A DAY AS NEEDED FOR SEVERE BREAKTHROUGH PAIN  0   pantoprazole (PROTONIX) 40 MG tablet TAKE 1 TABLET BY MOUTH TWICE A DAY 180 tablet 1   primidone (MYSOLINE) 50 MG tablet TAKE 1 TABLET BY MOUTH TWICE A DAY 180 tablet 1   Propylene Glycol (SYSTANE BALANCE) 0.6 % SOLN Place 1 drop into both eyes daily as needed (dry eyes).     Tiotropium Bromide Monohydrate (SPIRIVA RESPIMAT) 2.5 MCG/ACT AERS USE 1 INHALATION BY MOUTH  TWICE DAILY 12 g 3   Vitamin D, Ergocalciferol, (DRISDOL) 50000 units CAPS capsule Take 50,000 Units by mouth every Wednesday.     predniSONE (DELTASONE) 50 MG tablet Take 1 tablet (50 mg total) by mouth daily with breakfast. 5 tablet 0    promethazine-dextromethorphan (PROMETHAZINE-DM) 6.25-15 MG/5ML syrup Take 5 mLs by mouth 4 (four) times daily as needed for cough. 118 mL 0   No facility-administered medications prior to visit.    Allergies  Allergen Reactions   Beta Vulgaris Anaphylaxis    Beets    Penicillins Shortness Of Breath   Celecoxib Other (See Comments)    "because of stomach ulcers"   Chantix [Varenicline]     Makes pt sick on her stomach    Dog Epithelium (Canis Lupus Familiaris)     Cats and dogs   Fish Oil Other (See Comments)    hemorrhoids   Gramineae Pollens    Molds & Smuts    Nsaids Other (See Comments)    GI Upset. Ulcers   Other Other (See Comments)    beets  Including Celebrex   Tape Dermatitis    Electrodes caused blistering   Nicotine Nausea Only and Other (See Comments)    Review of Systems  Constitutional:  Negative for chills, fatigue and fever.  HENT:  Negative for congestion.   Respiratory:  Positive for cough. Negative for shortness of breath.   Cardiovascular:  Positive for leg swelling. Negative for chest pain.  Gastrointestinal:  Negative for abdominal pain.       Objective:        10/08/2022   11:21 AM 09/13/2022    4:50 PM 08/22/2022   11:42 AM  Vitals with BMI  Height 5\' 0"  5\' 0"    Weight 141 lbs 137 lbs   BMI 27.54 26.76   Systolic 110 126 098  Diastolic 78 74 70  Pulse 103 102     No data found.   Physical  Exam Vitals reviewed.  Constitutional:      Appearance: Normal appearance. She is normal weight.  Neck:     Vascular: No carotid bruit.  Cardiovascular:     Rate and Rhythm: Normal rate and regular rhythm.     Heart sounds: Normal heart sounds.  Pulmonary:     Effort: Pulmonary effort is normal. No respiratory distress.     Breath sounds: Normal breath sounds.  Abdominal:     Tenderness: There is no abdominal tenderness.  Musculoskeletal:        General: No tenderness.     Right lower leg: Edema present.     Left lower leg: Edema  present.  Neurological:     Mental Status: She is alert.  Psychiatric:        Mood and Affect: Mood normal.        Behavior: Behavior normal.     Health Maintenance Due  Topic Date Due   Zoster Vaccines- Shingrix (1 of 2) Never done   COVID-19 Vaccine (5 - 2023-24 season) 05/14/2022    There are no preventive care reminders to display for this patient.   Lab Results  Component Value Date   TSH 0.688 10/08/2022   Lab Results  Component Value Date   WBC 11.6 (H) 10/08/2022   HGB 14.2 10/08/2022   HCT 43.1 10/08/2022   MCV 86 10/08/2022   PLT 492 (H) 10/08/2022   Lab Results  Component Value Date   NA 144 10/08/2022   K 4.5 10/08/2022   CO2 25 10/08/2022   GLUCOSE 91 10/08/2022   BUN 19 10/08/2022   CREATININE 0.80 10/08/2022   BILITOT <0.2 10/08/2022   ALKPHOS 44 10/08/2022   AST 21 10/08/2022   ALT 20 10/08/2022   PROT 6.5 10/08/2022   ALBUMIN 4.4 10/08/2022   CALCIUM 9.8 10/08/2022   ANIONGAP 8 07/21/2021   EGFR 80 10/08/2022   Lab Results  Component Value Date   CHOL 189 07/16/2022   Lab Results  Component Value Date   HDL 102 07/16/2022   Lab Results  Component Value Date   LDLCALC 70 07/16/2022   Lab Results  Component Value Date   TRIG 97 07/16/2022   Lab Results  Component Value Date   CHOLHDL 1.9 07/16/2022   No results found for: "HGBA1C"     Assessment & Plan:  Localized edema -     CBC with Differential/Platelet -     Comprehensive metabolic panel -     Furosemide; Take 1 tablet (20 mg total) by mouth daily.  Dispense: 30 tablet; Refill: 2 -     T4, free -     TSH     Meds ordered this encounter  Medications   furosemide (LASIX) 20 MG tablet    Sig: Take 1 tablet (20 mg total) by mouth daily.    Dispense:  30 tablet    Refill:  2    Orders Placed This Encounter  Procedures   CBC with Differential/Platelet   Comprehensive metabolic panel   T4, free   TSH     Follow-up: No follow-ups on file.  An After Visit  Summary was printed and given to the patient.  Blane Ohara, MD [facility] [phone]

## 2022-10-09 LAB — COMPREHENSIVE METABOLIC PANEL
ALT: 20 IU/L (ref 0–32)
AST: 21 IU/L (ref 0–40)
Albumin/Globulin Ratio: 2.1 (ref 1.2–2.2)
Albumin: 4.4 g/dL (ref 3.9–4.9)
Alkaline Phosphatase: 44 IU/L (ref 44–121)
BUN/Creatinine Ratio: 24 (ref 12–28)
BUN: 19 mg/dL (ref 8–27)
Bilirubin Total: <0.2 mg/dL (ref 0.0–1.2)
CO2: 25 mmol/L (ref 20–29)
Calcium: 9.8 mg/dL (ref 8.7–10.3)
Chloride: 101 mmol/L (ref 96–106)
Creatinine, Ser: 0.8 mg/dL (ref 0.57–1.00)
Globulin, Total: 2.1 g/dL (ref 1.5–4.5)
Glucose: 91 mg/dL (ref 70–99)
Potassium: 4.5 mmol/L (ref 3.5–5.2)
Sodium: 144 mmol/L (ref 134–144)
Total Protein: 6.5 g/dL (ref 6.0–8.5)
eGFR: 80 mL/min/{1.73_m2} (ref >59)

## 2022-10-09 LAB — CBC WITH DIFFERENTIAL/PLATELET
Basophils Absolute: 0.1 10*3/uL (ref 0.0–0.2)
Basos: 1 %
EOS (ABSOLUTE): 0 10*3/uL (ref 0.0–0.4)
Eos: 0 %
Hematocrit: 43.1 % (ref 34.0–46.6)
Hemoglobin: 14.2 g/dL (ref 11.1–15.9)
Immature Grans (Abs): 0 10*3/uL (ref 0.0–0.1)
Immature Granulocytes: 0 %
Lymphocytes Absolute: 2.1 10*3/uL (ref 0.7–3.1)
Lymphs: 19 %
MCH: 28.4 pg (ref 26.6–33.0)
MCHC: 32.9 g/dL (ref 31.5–35.7)
MCV: 86 fL (ref 79–97)
Monocytes Absolute: 0.5 10*3/uL (ref 0.1–0.9)
Monocytes: 4 %
Neutrophils Absolute: 8.7 10*3/uL — ABNORMAL HIGH (ref 1.4–7.0)
Neutrophils: 76 %
Platelets: 492 10*3/uL — ABNORMAL HIGH (ref 150–450)
RBC: 5 x10E6/uL (ref 3.77–5.28)
RDW: 15.4 % (ref 11.7–15.4)
WBC: 11.6 10*3/uL — ABNORMAL HIGH (ref 3.4–10.8)

## 2022-10-09 LAB — T4, FREE: Free T4: 1.73 ng/dL (ref 0.82–1.77)

## 2022-10-09 LAB — TSH: TSH: 0.688 u[IU]/mL (ref 0.450–4.500)

## 2022-10-17 NOTE — Assessment & Plan Note (Signed)
Well controlled. Continue pantoprazole 40 mg twice daily.

## 2022-10-17 NOTE — Progress Notes (Signed)
Cancelled due to car trouble.

## 2022-10-17 NOTE — Assessment & Plan Note (Signed)
Well controlled.  No changes to medicines. Taking Atorvastatin 40 mg daily, fenofibrate 160 mg everyday. Continue to work on eating a healthy diet and exercise.    

## 2022-10-17 NOTE — Assessment & Plan Note (Signed)
Increase Abilify to 5 mg daily. 

## 2022-10-17 NOTE — Assessment & Plan Note (Signed)
The current medical regimen is effective;  continue present plan and medications. Continue duloxetine 60 mg daily, buspirone 30 mg twice daily, hydroxyzine 25 mg three times a day, and on abilify 2 mg once daily.      

## 2022-10-17 NOTE — Assessment & Plan Note (Signed)
Previously well controlled Continue Synthroid at current dose  Management per specialist.   

## 2022-10-18 ENCOUNTER — Encounter: Payer: 59 | Admitting: Family Medicine

## 2022-10-18 DIAGNOSIS — F411 Generalized anxiety disorder: Secondary | ICD-10-CM

## 2022-10-18 DIAGNOSIS — E559 Vitamin D deficiency, unspecified: Secondary | ICD-10-CM

## 2022-10-18 DIAGNOSIS — E039 Hypothyroidism, unspecified: Secondary | ICD-10-CM

## 2022-10-18 DIAGNOSIS — K219 Gastro-esophageal reflux disease without esophagitis: Secondary | ICD-10-CM

## 2022-10-18 DIAGNOSIS — F331 Major depressive disorder, recurrent, moderate: Secondary | ICD-10-CM

## 2022-10-18 DIAGNOSIS — E782 Mixed hyperlipidemia: Secondary | ICD-10-CM

## 2022-10-23 DIAGNOSIS — H52223 Regular astigmatism, bilateral: Secondary | ICD-10-CM | POA: Diagnosis not present

## 2022-10-23 DIAGNOSIS — H04123 Dry eye syndrome of bilateral lacrimal glands: Secondary | ICD-10-CM | POA: Diagnosis not present

## 2022-10-23 DIAGNOSIS — H259 Unspecified age-related cataract: Secondary | ICD-10-CM | POA: Diagnosis not present

## 2022-10-23 DIAGNOSIS — H25812 Combined forms of age-related cataract, left eye: Secondary | ICD-10-CM | POA: Diagnosis not present

## 2022-10-24 ENCOUNTER — Other Ambulatory Visit: Payer: Self-pay

## 2022-10-24 ENCOUNTER — Telehealth: Payer: Self-pay

## 2022-10-24 MED ORDER — CETIRIZINE HCL 10 MG PO TABS: ORAL_TABLET | ORAL | 2 refills | Status: DC

## 2022-10-24 NOTE — Telephone Encounter (Signed)
Done

## 2022-10-24 NOTE — Telephone Encounter (Signed)
Prescription Request  10/24/2022  LOV: Visit date not found  What is the name of the medication or equipment?  cetirizine (ZYRTEC) 10 MG tablet  Have you contacted your pharmacy to request a refill? Yes   Which pharmacy would you like this sent to?  [id number], [place] - [address] [place] [address] Phone: [phone] Fax: [phone]     Patient notified that their request is being sent to the clinical staff for review and that they should receive a response within 2 business days.   Please advise at [facility] [phone]

## 2022-10-26 NOTE — Progress Notes (Signed)
This encounter was created in error - please disregard.

## 2022-10-31 DIAGNOSIS — M810 Age-related osteoporosis without current pathological fracture: Secondary | ICD-10-CM | POA: Diagnosis not present

## 2022-10-31 DIAGNOSIS — E559 Vitamin D deficiency, unspecified: Secondary | ICD-10-CM | POA: Diagnosis not present

## 2022-10-31 DIAGNOSIS — E2740 Unspecified adrenocortical insufficiency: Secondary | ICD-10-CM | POA: Diagnosis not present

## 2022-10-31 DIAGNOSIS — Z8639 Personal history of other endocrine, nutritional and metabolic disease: Secondary | ICD-10-CM | POA: Diagnosis not present

## 2022-10-31 DIAGNOSIS — E039 Hypothyroidism, unspecified: Secondary | ICD-10-CM | POA: Diagnosis not present

## 2022-11-01 ENCOUNTER — Ambulatory Visit: Payer: Self-pay

## 2022-11-01 NOTE — Patient Outreach (Signed)
  Care Coordination   Follow Up Visit Note   11/01/2022 Name: Connie Coffey MRN: [id number] DOB: May 09, 1956  Connie Coffey is a 67 y.o. year old female who sees Cox, Kirsten, MD for primary care. I spoke with  Connie Coffey by phone today.  What matters to the patients health and wellness today?  Follow up call to patient.  Patient reports that she slid off the bed and hit her bottom and elbow,  Reports pain in the upper back now. Reports she had a back injection about 2 month ago and was doing well until this "slip off" the bed.   Reports she had cataract surgery on the left eye on 10/23/2022,  Right eye is next 11/06/2022. Patient reports that she needs to have her Dexa Scan moved up. Reports it is scheduled for 04/2023 but her endocrinologist is prescribing her medications after 11/26/2022 based on the bone density results.  Currently patient is taking injections daily of Tymlos.    Goals Addressed               This Visit's Progress     Chronic back pain and lack of energy (pt-stated)        Interventions Today    Flowsheet Row Most Recent Value  Chronic Disease   Chronic disease during today's visit Other  [Chronic pain and fall]  General Interventions   General Interventions Discussed/Reviewed General Interventions Reviewed, Communication with  Communication with PCP/Specialists  [message sent to MD to inquire about moving up date of dexa scan]  Exercise Interventions   Exercise Discussed/Reviewed Exercise Discussed  Education Interventions   Education Provided Provided Education  [reviewed fall prevention.]  Provided Verbal Education On Medication  Nutrition Interventions   Nutrition Discussed/Reviewed Nutrition Discussed  Pharmacy Interventions   Pharmacy Dicussed/Reviewed Medications and their functions  Safety Interventions   Safety Discussed/Reviewed Fall Risk      Reviewed recent fall Reviewed recent eye surgery. Encouraged patient to call her neurosurgeon if she needs  to have additional back injections as the injections worked in April 2024.  Will plan follow up in 3 months. Patient to call me sooner if needed.   Rowe Pavy, RN, BSN, CEN [facility] Coordinator [phone]         SDOH assessments and interventions completed:  No     Care Coordination Interventions:  Yes, provided   Follow up plan: Follow up call scheduled for 01/31/2023    Encounter Outcome:  Pt. Visit Completed   Rowe Pavy, RN, BSN, CEN [facility] Coordinator [phone]

## 2022-11-02 ENCOUNTER — Ambulatory Visit (INDEPENDENT_AMBULATORY_CARE_PROVIDER_SITE_OTHER): Payer: 59 | Admitting: Family Medicine

## 2022-11-02 VITALS — BP 138/68 | HR 88 | Temp 97.2°F | Resp 14 | Ht 60.0 in | Wt 139.8 lb

## 2022-11-02 DIAGNOSIS — J441 Chronic obstructive pulmonary disease with (acute) exacerbation: Secondary | ICD-10-CM

## 2022-11-02 MED ORDER — TRIAMCINOLONE ACETONIDE 40 MG/ML IJ SUSP
80.0000 mg | Freq: Once | INTRAMUSCULAR | Status: AC
Start: 2022-11-02 — End: 2022-11-02
  Administered 2022-11-02: 80 mg via INTRAMUSCULAR

## 2022-11-02 MED ORDER — PROMETHAZINE-DM 6.25-15 MG/5ML PO SYRP
5.0000 mL | ORAL_SOLUTION | Freq: Four times a day (QID) | ORAL | 0 refills | Status: DC | PRN
Start: 2022-11-02 — End: 2023-01-14

## 2022-11-02 MED ORDER — PREDNISONE 10 MG PO TABS
ORAL_TABLET | ORAL | 0 refills | Status: AC
Start: 2022-11-02 — End: 2022-11-07

## 2022-11-02 MED ORDER — CEFTRIAXONE SODIUM 1 G IJ SOLR
1.0000 g | Freq: Once | INTRAMUSCULAR | Status: AC
Start: 2022-11-02 — End: 2022-11-02
  Administered 2022-11-02: 1 g via INTRAMUSCULAR

## 2022-11-02 MED ORDER — AZITHROMYCIN 250 MG PO TABS
ORAL_TABLET | ORAL | 0 refills | Status: DC
Start: 2022-11-02 — End: 2023-01-03

## 2022-11-02 NOTE — Progress Notes (Unsigned)
Acute Office Visit  Subjective:    Patient ID: Connie Coffey, female    DOB: 05/01/1956, 67 y.o.   MRN: [id number]  Chief Complaint  Patient presents with   Cough    HPI: Patient is in today for cough. She has been coughing for several weeks.  She is taking mucinex daily with very little relief.  She is scheduled for cataract surgery November 06, 2022 and she hopes to get rid of the cough before her surgery.    Past Medical History:  Diagnosis Date   Adrenal insufficiency (HCC)    Anxiety    Arthritis    Carpal tunnel syndrome    Both hands, worse in right   Chronic constipation    COPD (chronic obstructive pulmonary disease) (HCC)    CVA (cerebral vascular accident) (HCC)    Depression    Facet syndrome    Fibromyalgia    GERD (gastroesophageal reflux disease)    Heart murmur    Hypercholesteremia    IBS (irritable bowel syndrome)    Ischemic stroke (HCC)    07/19/2004   Lumbar radiculopathy    Osteoporosis    Radiculopathy, lumbar region 05/02/2021   RLS (restless legs syndrome)    Spinal stenosis    TMJ (temporomandibular joint disorder)    Trochanteric bursitis of both hips    Vascular malformation    spinal cord    Past Surgical History:  Procedure Laterality Date   APPENDECTOMY     CARPAL TUNNEL RELEASE Right 12/13/2017   surgery   CHOLECYSTECTOMY     COLONOSCOPY  05/06/2017   Colonic polyps status post polypectomy. Interan land external hemorrhoids.    ESOPHAGOGASTRODUODENOSCOPY  04/28/2007   Mild gastrtiis. Otherwise normal EGD.    HEMORROIDECTOMY     LAMINECTOMY AND MICRODISCECTOMY LUMBAR SPINE  01/03/2005   NECK SURGERY     x2    OTHER SURGICAL HISTORY  04/24/2004   anterior cervical discectomy and fusion at C5-C6   SPINAL CORD STIMULATOR INSERTION N/A 07/21/2021   Procedure: Permanant Spinal cord stimulator placement leads and battery under fluroscopy;  Surgeon: Renaldo Fiddler, MD;  Location: [facility] OR;  Service: Neurosurgery;  Laterality: N/A;    TONSILLECTOMY     TOTAL ABDOMINAL HYSTERECTOMY      Family History  Problem Relation Age of Onset   Supraventricular tachycardia Mother    Dementia Mother    Hyperlipidemia Mother    Hypertension Mother    Osteoarthritis Mother    Osteoporosis Mother    Breast cancer Other    Breast cancer Other    Colon cancer Neg Hx    Esophageal cancer Neg Hx     Social History   Socioeconomic History   Marital status: Divorced    Spouse name: Not on file   Number of children: 1   Years of education: Not on file   Highest education level: Not on file  Occupational History   Occupation: Disabled  Tobacco Use   Smoking status: Every Day    Packs/day: .5    Types: Cigarettes   Smokeless tobacco: Never  Vaping Use   Vaping Use: Never used  Substance and Sexual Activity   Alcohol use: Not Currently    Comment: quit 19 years   Drug use: Never   Sexual activity: Not Currently  Other Topics Concern   Not on file  Social History Narrative   Disabled son lives with her sometimes and his father sometimes.  Connie Coffey is working hard  to have a relationship with her son's father and his wife.  They help her when needed.   Social Determinants of Health   Financial Resource Strain: Low Risk  (11/15/2021)   Overall Financial Resource Strain (CARDIA)    Difficulty of Paying Living Expenses: Not hard at all  Food Insecurity: No Food Insecurity (07/18/2022)   Hunger Vital Sign    Worried About Running Out of Food in the Last Year: Never true    Ran Out of Food in the Last Year: Never true  Transportation Needs: No Transportation Needs (07/18/2022)   PRAPARE - Administrator, Civil Service (Medical): No    Lack of Transportation (Non-Medical): No  Physical Activity: Inactive (07/18/2022)   Exercise Vital Sign    Days of Exercise per Week: 0 days    Minutes of Exercise per Session: 0 min  Stress: No Stress Concern Present (07/18/2022)   [employer] of Occupational Health -  Occupational Stress Questionnaire    Feeling of Stress : Only a little  Social Connections: Socially Isolated (07/18/2022)   Social Connection and Isolation Panel [NHANES]    Frequency of Communication with Friends and Family: Three times a week    Frequency of Social Gatherings with Friends and Family: Three times a week    Attends Religious Services: Never    Active Member of Clubs or Organizations: No    Attends Banker Meetings: Never    Marital Status: Divorced  Catering manager Violence: Not At Risk (07/18/2022)   Humiliation, Afraid, Rape, and Kick questionnaire    Fear of Current or Ex-Partner: No    Emotionally Abused: No    Physically Abused: No    Sexually Abused: No    Outpatient Medications Prior to Visit  Medication Sig Dispense Refill   Abaloparatide (TYMLOS) 3120 MCG/1.56ML SOPN Inject 80 mcg into the skin daily. 4.68 mL 1   albuterol (VENTOLIN HFA) 108 (90 Base) MCG/ACT inhaler Inhale 2 puffs into the lungs every 4 (four) hours as needed. 54 g 3   ARIPiprazole (ABILIFY) 5 MG tablet TAKE 1 TABLET (5 MG TOTAL) BY MOUTH DAILY. 90 tablet 1   atorvastatin (LIPITOR) 40 MG tablet TAKE 1 TABLET BY MOUTH DAILY 90 tablet 1   Azelastine-Fluticasone 137-50 MCG/ACT SUSP Place 1 spray into the nose every 12 (twelve) hours. 69 g 3   bethanechol (URECHOLINE) 25 MG tablet Take 1 tablet (25 mg total) by mouth in the morning and at bedtime. 180 tablet 1   busPIRone (BUSPAR) 30 MG tablet TAKE 1 TABLET BY MOUTH TWICE A DAY 180 tablet 1   cetirizine (ZYRTEC) 10 MG tablet TAKE 1 TABLET BY MOUTH EVERYDAY AT BEDTIME 90 tablet 2   ciclopirox (PENLAC) 8 % solution Apply topically at bedtime. Apply over nail and surrounding skin. Apply daily over previous coat. After seven (7) days, may remove with alcohol and continue cycle. 6.6 mL 0   cyclobenzaprine (FLEXERIL) 10 MG tablet TAKE 1 TABLET BY MOUTH 3  TIMES DAILY 270 tablet 1   doxycycline (VIBRA-TABS) 100 MG tablet Take 1 tablet (100  mg total) by mouth 2 (two) times daily. 20 tablet 0   DULoxetine (CYMBALTA) 60 MG capsule TAKE 1 CAPSULE BY MOUTH  DAILY 90 capsule 3   fenofibrate 160 MG tablet Take 1 tablet (160 mg total) by mouth daily. 90 tablet 0   furosemide (LASIX) 20 MG tablet Take 1 tablet (20 mg total) by mouth daily. 30 tablet 2   gabapentin (  NEURONTIN) 600 MG tablet TAKE 1 TABLET BY MOUTH 4 TIMES  DAILY 400 tablet 2   hydrocortisone (CORTEF) 10 MG tablet Take 10 mg by mouth 2 (two) times daily.     hydrOXYzine (VISTARIL) 25 MG capsule TAKE 1 CAPSULE (25 MG TOTAL) BY MOUTH EVERY 8 (EIGHT) HOURS AS NEEDED FOR ANXIETY OR ITCHING. 270 capsule 0   Insulin Pen Needle (GLOBAL EASY GLIDE PEN NEEDLES) 32G X 4 MM MISC Use one needle daily as directed     levocetirizine (XYZAL) 5 MG tablet TAKE 1 TABLET BY MOUTH EVERY DAY IN THE EVENING 90 tablet 3   levothyroxine (SYNTHROID) 75 MCG tablet TAKE 1 TABLET BY MOUTH EVERY DAY BEFORE BREAKFAST 90 tablet 2   lubiprostone (AMITIZA) 8 MCG capsule TAKE 1 CAPSULE BY MOUTH TWICE  DAILY WITH MEALS 120 capsule 5   montelukast (SINGULAIR) 10 MG tablet TAKE 1 TABLET BY MOUTH DAILY 90 tablet 1   Multiple Vitamin (MULTI-VITAMINS) TABS Take 1 tablet by mouth daily.     olopatadine (PATANOL) 0.1 % ophthalmic solution Place 1 drop into both eyes daily as needed for allergies.     oxyCODONE-acetaminophen (PERCOCET) 10-325 MG tablet TAKE 1 TAB EVERY 4 HOURS FOR 5 DAYS THEN 1 TAB TWICE A DAY AS NEEDED FOR SEVERE BREAKTHROUGH PAIN  0   pantoprazole (PROTONIX) 40 MG tablet TAKE 1 TABLET BY MOUTH TWICE A DAY 180 tablet 1   primidone (MYSOLINE) 50 MG tablet TAKE 1 TABLET BY MOUTH TWICE A DAY 180 tablet 1   Propylene Glycol (SYSTANE BALANCE) 0.6 % SOLN Place 1 drop into both eyes daily as needed (dry eyes).     Tiotropium Bromide Monohydrate (SPIRIVA RESPIMAT) 2.5 MCG/ACT AERS USE 1 INHALATION BY MOUTH  TWICE DAILY 12 g 3   Vitamin D, Ergocalciferol, (DRISDOL) 50000 units CAPS capsule Take 50,000 Units by  mouth every Wednesday.     No facility-administered medications prior to visit.    Allergies  Allergen Reactions   Beta Vulgaris Anaphylaxis    Beets    Penicillins Shortness Of Breath   Celecoxib Other (See Comments)    "because of stomach ulcers"   Chantix [Varenicline]     Makes pt sick on her stomach    Dog Epithelium (Canis Lupus Familiaris)     Cats and dogs   Fish Oil Other (See Comments)    hemorrhoids   Gramineae Pollens    Molds & Smuts    Nsaids Other (See Comments)    GI Upset. Ulcers   Other Other (See Comments)    beets  Including Celebrex   Tape Dermatitis    Electrodes caused blistering   Nicotine Nausea Only and Other (See Comments)    Review of Systems  Constitutional:  Positive for fatigue. Negative for chills and fever.  HENT:  Positive for congestion and ear pain.   Respiratory:  Positive for cough and shortness of breath.   Cardiovascular:  Negative for chest pain.  Gastrointestinal:  Positive for constipation.  Musculoskeletal:  Positive for back pain.  Neurological:  Positive for dizziness.  Psychiatric/Behavioral:  Positive for dysphoric mood. The patient is not nervous/anxious.        Objective:        11/02/2022   10:13 AM 10/08/2022   11:21 AM 09/13/2022    4:50 PM  Vitals with BMI  Height 5\' 0"  5\' 0"  5\' 0"   Weight 139 lbs 13 oz 141 lbs 137 lbs  BMI 27.3 27.54 26.76  Systolic 138  110 126  Diastolic 68 78 74  Pulse 88 103 102    No data found.   Physical Exam Vitals reviewed.  Constitutional:      Appearance: Normal appearance.  HENT:     Right Ear: Tympanic membrane, ear canal and external ear normal.     Left Ear: Tympanic membrane, ear canal and external ear normal.     Nose: Congestion present.     Mouth/Throat:     Pharynx: Oropharynx is clear.  Cardiovascular:     Rate and Rhythm: Normal rate and regular rhythm.     Heart sounds: Normal heart sounds. No murmur heard. Pulmonary:     Effort: Pulmonary effort is  normal. No respiratory distress.     Breath sounds: Wheezing and rhonchi present.  Lymphadenopathy:     Cervical: No cervical adenopathy.  Neurological:     Mental Status: She is alert and oriented to person, place, and time.  Psychiatric:        Mood and Affect: Mood normal.        Behavior: Behavior normal.     Health Maintenance Due  Topic Date Due   Zoster Vaccines- Shingrix (1 of 2) Never done   COVID-19 Vaccine (5 - 2023-24 season) 05/14/2022    There are no preventive care reminders to display for this patient.   Lab Results  Component Value Date   TSH 0.688 10/08/2022   Lab Results  Component Value Date   WBC 11.6 (H) 10/08/2022   HGB 14.2 10/08/2022   HCT 43.1 10/08/2022   MCV 86 10/08/2022   PLT 492 (H) 10/08/2022   Lab Results  Component Value Date   NA 144 10/08/2022   K 4.5 10/08/2022   CO2 25 10/08/2022   GLUCOSE 91 10/08/2022   BUN 19 10/08/2022   CREATININE 0.80 10/08/2022   BILITOT <0.2 10/08/2022   ALKPHOS 44 10/08/2022   AST 21 10/08/2022   ALT 20 10/08/2022   PROT 6.5 10/08/2022   ALBUMIN 4.4 10/08/2022   CALCIUM 9.8 10/08/2022   ANIONGAP 8 07/21/2021   EGFR 80 10/08/2022   Lab Results  Component Value Date   CHOL 189 07/16/2022   Lab Results  Component Value Date   HDL 102 07/16/2022   Lab Results  Component Value Date   LDLCALC 70 07/16/2022   Lab Results  Component Value Date   TRIG 97 07/16/2022   Lab Results  Component Value Date   CHOLHDL 1.9 07/16/2022   No results found for: "HGBA1C"     Assessment & Plan:  COPD exacerbation (HCC) Assessment & Plan: Sent Zpack and prednisone 10 mg pre-pack as directed. Sent Promethazine DM Kenalog 80 mg IM #1 and ceftriaxone 1 g IM #1 were given at the office.   Orders: -     Triamcinolone Acetonide -     cefTRIAXone Sodium -     Azithromycin; 2 DAILY FOR FIRST DAY, THEN DECREASE TO ONE DAILY FOR 4 MORE DAYS.  Dispense: 6 tablet; Refill: 0 -     predniSONE; Take 6  tablets (60 mg total) by mouth daily with breakfast for 1 day, THEN 5 tablets (50 mg total) daily with breakfast for 1 day, THEN 4 tablets (40 mg total) daily with breakfast for 1 day, THEN 3 tablets (30 mg total) daily with breakfast for 1 day, THEN 2 tablets (20 mg total) daily with breakfast for 1 day, THEN 1 tablet (10 mg total) daily with breakfast for 1 day.  Dispense: 21 tablet; Refill: 0 -     Promethazine-DM; Take 5 mLs by mouth 4 (four) times daily as needed for cough.  Dispense: 180 mL; Refill: 0     Meds ordered this encounter  Medications   triamcinolone acetonide (KENALOG-40) injection 80 mg   cefTRIAXone (ROCEPHIN) injection 1 g   azithromycin (ZITHROMAX) 250 MG tablet    Sig: 2 DAILY FOR FIRST DAY, THEN DECREASE TO ONE DAILY FOR 4 MORE DAYS.    Dispense:  6 tablet    Refill:  0   predniSONE (DELTASONE) 10 MG tablet    Sig: Take 6 tablets (60 mg total) by mouth daily with breakfast for 1 day, THEN 5 tablets (50 mg total) daily with breakfast for 1 day, THEN 4 tablets (40 mg total) daily with breakfast for 1 day, THEN 3 tablets (30 mg total) daily with breakfast for 1 day, THEN 2 tablets (20 mg total) daily with breakfast for 1 day, THEN 1 tablet (10 mg total) daily with breakfast for 1 day.    Dispense:  21 tablet    Refill:  0   promethazine-dextromethorphan (PROMETHAZINE-DM) 6.25-15 MG/5ML syrup    Sig: Take 5 mLs by mouth 4 (four) times daily as needed for cough.    Dispense:  180 mL    Refill:  0    No orders of the defined types were placed in this encounter.    Follow-up: Return if symptoms worsen or fail to improve.  An After Visit Summary was printed and given to the patient.   Clayborn Bigness I Leal-Borjas,acting as a scribe for Blane Ohara, MD.,have documented all relevant documentation on the behalf of Blane Ohara, MD,as directed by  Blane Ohara, MD while in the presence of Blane Ohara, MD.   Blane Ohara, MD [facility] [phone]

## 2022-11-03 NOTE — Assessment & Plan Note (Signed)
Sent Zpack and prednisone 10 mg pre-pack as directed. Sent Promethazine DM Kenalog 80 mg IM #1 and ceftriaxone 1 g IM #1 were given at the office.

## 2022-11-06 ENCOUNTER — Ambulatory Visit: Payer: 59 | Admitting: Podiatry

## 2022-11-07 ENCOUNTER — Other Ambulatory Visit: Payer: Self-pay | Admitting: Family Medicine

## 2022-11-07 ENCOUNTER — Encounter: Payer: Self-pay | Admitting: Family Medicine

## 2022-11-15 ENCOUNTER — Telehealth: Payer: Self-pay

## 2022-11-15 NOTE — Telephone Encounter (Signed)
Patient called stating that we were treating her for swelling in her feet/ankles/legs and that it got  little better for the patient and now it is swelled back up again and in her words "about to bust." I told the patient we have no current appointments, and she asked if I could send message to you to ask you what you think she needs to do. Please advise.

## 2022-11-15 NOTE — Telephone Encounter (Signed)
Patient Made Aware, Verbalized Understanding. Lab appointment made for 12/03/22, Patient stated that her legs are swollen really bad, and they seem to keep swelling.  Recommended if the change and her lasix does not seem to help, she needs to go be seen at the nearest urgent care or ER.

## 2022-11-19 ENCOUNTER — Ambulatory Visit (INDEPENDENT_AMBULATORY_CARE_PROVIDER_SITE_OTHER): Payer: 59 | Admitting: Podiatry

## 2022-11-19 DIAGNOSIS — M25471 Effusion, right ankle: Secondary | ICD-10-CM

## 2022-11-19 DIAGNOSIS — L600 Ingrowing nail: Secondary | ICD-10-CM

## 2022-11-19 NOTE — Progress Notes (Signed)
Subjective:  Patient ID: Connie Coffey, female    DOB: Jun 01, 1955,  MRN: [id number]  Chief Complaint  Patient presents with   Ankle Problem    67 y.o. female presents with concern for pain in the right great toe due to ingrown nail.  She has previously had a portion of the nail removed in the past many years ago but it has regrown slightly and is causing pain what is residually there which is partially hard callus and partially nail.  Also having some swelling in the right ankle.  Not sure what is causing it and wants to see if anything can be done for it.  She does have a history of vein problems on that side right hallux nail  Past Medical History:  Diagnosis Date   Adrenal insufficiency (HCC)    Anxiety    Arthritis    Carpal tunnel syndrome    Both hands, worse in right   Chronic constipation    COPD (chronic obstructive pulmonary disease) (HCC)    CVA (cerebral vascular accident) (HCC)    Depression    Facet syndrome    Fibromyalgia    GERD (gastroesophageal reflux disease)    Heart murmur    Hypercholesteremia    IBS (irritable bowel syndrome)    Ischemic stroke (HCC)    07/19/2004   Lumbar radiculopathy    Osteoporosis    Radiculopathy, lumbar region 05/02/2021   RLS (restless legs syndrome)    Spinal stenosis    TMJ (temporomandibular joint disorder)    Trochanteric bursitis of both hips    Vascular malformation    spinal cord    Allergies  Allergen Reactions   Beta Vulgaris Anaphylaxis    Beets    Penicillins Shortness Of Breath   Celecoxib Other (See Comments)    "because of stomach ulcers"   Chantix [Varenicline]     Makes pt sick on her stomach    Dog Epithelium (Canis Lupus Familiaris)     Cats and dogs   Fish Oil Other (See Comments)    hemorrhoids   Gramineae Pollens    Molds & Smuts    Nsaids Other (See Comments)    GI Upset. Ulcers   Other Other (See Comments)    beets  Including Celebrex   Tape Dermatitis    Electrodes caused blistering    Nicotine Nausea Only and Other (See Comments)    ROS: Negative except as per HPI above  Objective:  General: AAO x3, NAD  Dermatological: Hard callus present as well as partial nail regrowth on the lateral border of the right hallux nail.  Pain on palpation of this area.  Vascular:  Dorsalis Pedis artery and Posterior Tibial artery pedal pulses are 2/4 bilateral.  Capillary fill time < 3 sec to all digits.  Edema noted about the right ankle with some purplish discoloration as well.  Neruologic: Grossly intact via light touch bilateral. Protective threshold intact to all sites bilateral.   Musculoskeletal: No gross boney pedal deformities bilateral. No pain, crepitus, or limitation noted with foot and ankle range of motion bilateral. Muscular strength 5/5 in all groups tested bilateral.  Gait: Unassisted, Nonantalgic.   No images are attached to the encounter.  Radiographs:   Assessment:   1. Ingrown nail of great toe of right foot   2. Edema of right ankle      Plan:  Patient was evaluated and treated and all questions answered.    Ingrown Nail, right -Patient elects to  proceed with minor surgery to remove ingrown toenail today. Consent reviewed and signed by patient. -Ingrown nail excised. See procedure note. -Educated on post-procedure care including soaking. Written instructions provided and reviewed. -Patient to follow up in 2 weeks for nail check.  Procedure: Excision of Ingrown Toenail Location: Right 1st toe total nail Anesthesia: Lidocaine 1% plain; 1.5 mL and Marcaine 0.5% plain; 1.5 mL, digital block. Skin Prep: Betadine. Dressing: Silvadene; telfa; dry, sterile, compression dressing. Technique: Following skin prep, the toe was exsanguinated and a tourniquet was secured at the base of the toe. The affected nail border was freed, split with a nail splitter, and excised. Chemical matrixectomy was then performed with phenol and irrigated out with alcohol. The  tourniquet was then removed and sterile dressing applied. Disposition: Patient tolerated procedure well. Patient to return in 2 weeks for follow-up.   # Right ankle edema -Discussed with patient that she may have evidence of chronic venous insufficiency causing her swelling -Recommend compression stockings as she is tolerated by pain -Recommend referral to vein center [facility] for further evaluation and treatment   No follow-ups on file.          Corinna Gab, DPM [facility] / [facility], [place]

## 2022-11-19 NOTE — Patient Instructions (Signed)
Soak Instructions    THE DAY AFTER THE PROCEDURE  Place 1/4 cup of epsom salts (or betadine, or white vinegar) in a quart of warm tap water.  Submerge your foot or feet with outer bandage intact for the initial soak; this will allow the bandage to become moist and wet for easy lift off.  Once you remove your bandage, continue to soak in the solution for 20 minutes.  This soak should be done twice a day.  Next, remove your foot or feet from solution, blot dry the affected area and cover.  You may use a band aid large enough to cover the area or use gauze and tape.  Apply other medications to the area as directed by the doctor such as polysporin neosporin.  IF YOUR SKIN BECOMES IRRITATED WHILE USING THESE INSTRUCTIONS, IT IS OKAY TO SWITCH TO  WHITE VINEGAR AND WATER. Or you may use antibacterial soap and water to keep the toe clean  Monitor for any signs/symptoms of infection. Call the office immediately if any occur or go directly to the emergency room. Call with any questions/concerns.    [facility] Instructions-Post Nail Surgery  You have had your ingrown toenail and root treated with a chemical.  This chemical causes a burn that will drain and ooze like a blister.  This can drain for 6-8 weeks or longer.  It is important to keep this area clean, covered, and follow the soaking instructions dispensed at the time of your surgery.  This area will eventually dry and form a scab.  Once the scab forms you no longer need to soak or apply a dressing.  If at any time you experience an increase in pain, redness, swelling, or drainage, you should contact the office as soon as possible.  

## 2022-11-20 DIAGNOSIS — I10 Essential (primary) hypertension: Secondary | ICD-10-CM | POA: Diagnosis not present

## 2022-11-20 DIAGNOSIS — Z7952 Long term (current) use of systemic steroids: Secondary | ICD-10-CM | POA: Diagnosis not present

## 2022-11-20 DIAGNOSIS — H269 Unspecified cataract: Secondary | ICD-10-CM | POA: Diagnosis not present

## 2022-11-20 DIAGNOSIS — E039 Hypothyroidism, unspecified: Secondary | ICD-10-CM | POA: Diagnosis not present

## 2022-11-20 DIAGNOSIS — K219 Gastro-esophageal reflux disease without esophagitis: Secondary | ICD-10-CM | POA: Diagnosis not present

## 2022-11-20 DIAGNOSIS — F17210 Nicotine dependence, cigarettes, uncomplicated: Secondary | ICD-10-CM | POA: Diagnosis not present

## 2022-11-20 DIAGNOSIS — J439 Emphysema, unspecified: Secondary | ICD-10-CM | POA: Diagnosis not present

## 2022-11-20 DIAGNOSIS — Z8673 Personal history of transient ischemic attack (TIA), and cerebral infarction without residual deficits: Secondary | ICD-10-CM | POA: Diagnosis not present

## 2022-11-20 DIAGNOSIS — J45909 Unspecified asthma, uncomplicated: Secondary | ICD-10-CM | POA: Diagnosis not present

## 2022-11-20 DIAGNOSIS — Z7951 Long term (current) use of inhaled steroids: Secondary | ICD-10-CM | POA: Diagnosis not present

## 2022-11-20 DIAGNOSIS — H25811 Combined forms of age-related cataract, right eye: Secondary | ICD-10-CM | POA: Diagnosis not present

## 2022-12-01 ENCOUNTER — Other Ambulatory Visit: Payer: Self-pay

## 2022-12-02 ENCOUNTER — Other Ambulatory Visit: Payer: Self-pay

## 2022-12-02 DIAGNOSIS — R600 Localized edema: Secondary | ICD-10-CM

## 2022-12-03 ENCOUNTER — Other Ambulatory Visit: Payer: 59

## 2022-12-03 DIAGNOSIS — R600 Localized edema: Secondary | ICD-10-CM

## 2022-12-04 LAB — COMPREHENSIVE METABOLIC PANEL
ALT: 29 IU/L (ref 0–32)
AST: 28 IU/L (ref 0–40)
Albumin: 4.6 g/dL (ref 3.9–4.9)
Alkaline Phosphatase: 53 IU/L (ref 44–121)
BUN/Creatinine Ratio: 19 (ref 12–28)
BUN: 17 mg/dL (ref 8–27)
Bilirubin Total: <0.2 mg/dL (ref 0.0–1.2)
CO2: 30 mmol/L — ABNORMAL HIGH (ref 20–29)
Calcium: 9.9 mg/dL (ref 8.7–10.3)
Chloride: 97 mmol/L (ref 96–106)
Creatinine, Ser: 0.88 mg/dL (ref 0.57–1.00)
Globulin, Total: 2.4 g/dL (ref 1.5–4.5)
Glucose: 77 mg/dL (ref 70–99)
Potassium: 4.7 mmol/L (ref 3.5–5.2)
Sodium: 143 mmol/L (ref 134–144)
Total Protein: 7 g/dL (ref 6.0–8.5)
eGFR: 72 mL/min/{1.73_m2} (ref >59)

## 2022-12-04 NOTE — Progress Notes (Signed)
Recommend compression hose. Elevate leg.  Avoid salt.

## 2022-12-06 ENCOUNTER — Other Ambulatory Visit: Payer: Self-pay | Admitting: Family Medicine

## 2022-12-06 DIAGNOSIS — K219 Gastro-esophageal reflux disease without esophagitis: Secondary | ICD-10-CM

## 2022-12-10 ENCOUNTER — Ambulatory Visit: Payer: 59 | Admitting: Podiatry

## 2022-12-12 ENCOUNTER — Telehealth: Payer: Self-pay

## 2022-12-12 ENCOUNTER — Other Ambulatory Visit: Payer: Self-pay

## 2022-12-12 DIAGNOSIS — R600 Localized edema: Secondary | ICD-10-CM

## 2022-12-12 MED ORDER — TORSEMIDE 10 MG PO TABS
10.0000 mg | ORAL_TABLET | Freq: Every day | ORAL | 0 refills | Status: DC
Start: 2022-12-12 — End: 2022-12-23

## 2022-12-12 NOTE — Telephone Encounter (Signed)
Patient called stating that her bilateral edema in her feet and ankles is not any better. I asked her how much of the Furosemide is she taking she states that she is taking 2 tablets of the 20 mg daily and now she is out of the medication and states it was not even helping her. I asked her if she had been drinking plenty of fluids and she states that she has been drinking watermelon juice and she has been drinking iced tea because she states she doesn't like water. Please advise.

## 2022-12-12 NOTE — Telephone Encounter (Signed)
Recommend changing Lasix to torsemide 10 mg daily.  Torsemide is stronger than Lasix if she questions whether a lower dose.  Keep appointment on 8 August. Dr. Sedalia Muta

## 2022-12-12 NOTE — Telephone Encounter (Signed)
Patient informed and patient requested rx go to [facility] on [address] in [place]. Sending rx to pharmacy and told her to discontinue the furosemide.

## 2022-12-16 ENCOUNTER — Other Ambulatory Visit: Payer: Self-pay | Admitting: Family Medicine

## 2022-12-23 ENCOUNTER — Other Ambulatory Visit: Payer: Self-pay | Admitting: Family Medicine

## 2022-12-23 DIAGNOSIS — R600 Localized edema: Secondary | ICD-10-CM

## 2022-12-23 DIAGNOSIS — E782 Mixed hyperlipidemia: Secondary | ICD-10-CM

## 2022-12-24 ENCOUNTER — Ambulatory Visit (INDEPENDENT_AMBULATORY_CARE_PROVIDER_SITE_OTHER): Payer: 59 | Admitting: Podiatry

## 2022-12-24 ENCOUNTER — Other Ambulatory Visit: Payer: Self-pay

## 2022-12-24 DIAGNOSIS — L600 Ingrowing nail: Secondary | ICD-10-CM

## 2022-12-24 DIAGNOSIS — I739 Peripheral vascular disease, unspecified: Secondary | ICD-10-CM | POA: Diagnosis not present

## 2022-12-24 DIAGNOSIS — Q828 Other specified congenital malformations of skin: Secondary | ICD-10-CM

## 2022-12-24 DIAGNOSIS — B370 Candidal stomatitis: Secondary | ICD-10-CM

## 2022-12-24 DIAGNOSIS — M25471 Effusion, right ankle: Secondary | ICD-10-CM

## 2022-12-24 MED ORDER — UREA 10 % EX CREA: TOPICAL_CREAM | CUTANEOUS | 0 refills | Status: DC | PRN

## 2022-12-24 NOTE — Progress Notes (Signed)
Subjective:  Patient ID: Connie Coffey, female    DOB: 06-06-1955,  MRN: [id number]  Chief Complaint  Patient presents with   Follow-up    F/u after R nail avulsion   Foot Swelling    Swelling ankles and feet. Patient is currently taking Torsemide x 2 weeks that was prescribed by PCP. She was using compression socks but has stopped because she believes it was not working.    Plantar Warts    Plantar wart to left heel.     67 y.o. female presents with concern for cramping and swelling in the ankles and feet bilaterally.  Patient is taking torsemide.  She was trying to wear compression socks however they cause her pain and difficulty getting them on.  She does have a history of smoking.  She had ABI PVR testing done in December 2023 which was normal at that time.  However she notes that she has a lot of cramping and pain in both legs and ankles.  Right worse than left.  She does notice that she has skin discoloration in the right leg and her scratches on her leg from her cat do not seem to heal as fast.  She also notes that when she has cramping pain at night when she is laying in bed if she hangs her legs down or gets out of bed it seems to go away pretty quickly.  Past Medical History:  Diagnosis Date   Adrenal insufficiency (HCC)    Anxiety    Arthritis    Carpal tunnel syndrome    Both hands, worse in right   Chronic constipation    COPD (chronic obstructive pulmonary disease) (HCC)    CVA (cerebral vascular accident) (HCC)    Depression    Facet syndrome    Fibromyalgia    GERD (gastroesophageal reflux disease)    Heart murmur    Hypercholesteremia    IBS (irritable bowel syndrome)    Ischemic stroke (HCC)    07/19/2004   Lumbar radiculopathy    Osteoporosis    Radiculopathy, lumbar region 05/02/2021   RLS (restless legs syndrome)    Spinal stenosis    TMJ (temporomandibular joint disorder)    Trochanteric bursitis of both hips    Vascular malformation    spinal cord     Allergies  Allergen Reactions   Beta Vulgaris Anaphylaxis    Beets    Penicillins Shortness Of Breath   Celecoxib Other (See Comments)    "because of stomach ulcers"   Chantix [Varenicline]     Makes pt sick on her stomach    Dog Epithelium (Canis Lupus Familiaris)     Cats and dogs   Fish Oil Other (See Comments)    hemorrhoids   Gramineae Pollens    Molds & Smuts    Nsaids Other (See Comments)    GI Upset. Ulcers   Other Other (See Comments)    beets  Including Celebrex   Tape Dermatitis    Electrodes caused blistering   Nicotine Nausea Only and Other (See Comments)    ROS: Negative except as per HPI above  Objective:  General: AAO x3, NAD  Dermatological: Right hallux nailbed is healing with minimal eschar without evidence of erythema maceration or infection.  There is no to be some mottling and purplish reddish discoloration present on the bilateral lower extremity.  Edema is noted about the right ankle.  There is hyper keratotic lesion with central hyperkeratotic core present on the left heel  with pain on palpation.  Vascular: DP and PT pulses are diminished..   Neruologic: Grossly intact via light touch bilateral. Protective threshold intact to all sites bilateral.   Musculoskeletal: No gross boney pedal deformities bilateral. No pain, crepitus, or limitation noted with foot and ankle range of motion bilateral. Muscular strength 5/5 in all groups tested bilateral.  Gait: Unassisted, Nonantalgic.   No images are attached to the encounter.   Assessment:   1. PAD (peripheral artery disease) (HCC)   2. Claudication in peripheral vascular disease (HCC)   3. Edema of right ankle   4. Porokeratosis   5. Ingrown nail of great toe of right foot      Plan:  Patient was evaluated and treated and all questions answered.  # Right ankle edema and pain/cramping and bilateral lower extremity -Discussed with patient I do believe she has a problem with bilateral  lower extremity peripheral arterial disease/claudication -Recommend referral to vascular specialist to evaluate for possible PVD the bilateral lower extremities right worse than left -Referral was placed at this visit  # Ingrown nail right hallux status post total hallux phenol and alcohol matrixectomy -Improved after further Epsom salt soaks now nearly fully healed without any pain or problems -Okay to cover with Band-Aid as desired but does not need to  # Painful hyperkeratotic lesion representing porokeratosis left plantar heel All symptomatic hyperkeratoses were safely debrided with a sterile #15 blade to patient's level of comfort without incident. We discussed preventative and palliative care of these lesions including supportive and accommodative shoegear, padding, prefabricated and custom molded accommodative orthoses, use of a pumice stone and lotions/creams daily.   Return in about 9 weeks (around 02/25/2023) for L heel callus.          Corinna Gab, DPM [facility] / [facility]

## 2022-12-25 MED ORDER — NYSTATIN 100000 UNIT/ML MT SUSP
5.0000 mL | Freq: Four times a day (QID) | OROMUCOSAL | 2 refills | Status: DC
Start: 2022-12-25 — End: 2023-05-17

## 2022-12-27 ENCOUNTER — Ambulatory Visit: Payer: 59 | Admitting: Family Medicine

## 2023-01-01 ENCOUNTER — Other Ambulatory Visit: Payer: Self-pay | Admitting: *Deleted

## 2023-01-01 DIAGNOSIS — M79606 Pain in leg, unspecified: Secondary | ICD-10-CM

## 2023-01-02 DIAGNOSIS — M5134 Other intervertebral disc degeneration, thoracic region: Secondary | ICD-10-CM | POA: Diagnosis not present

## 2023-01-02 DIAGNOSIS — Z9689 Presence of other specified functional implants: Secondary | ICD-10-CM | POA: Diagnosis not present

## 2023-01-02 DIAGNOSIS — M961 Postlaminectomy syndrome, not elsewhere classified: Secondary | ICD-10-CM | POA: Diagnosis not present

## 2023-01-03 ENCOUNTER — Encounter: Payer: Self-pay | Admitting: Vascular Surgery

## 2023-01-03 ENCOUNTER — Other Ambulatory Visit: Payer: Self-pay | Admitting: Family Medicine

## 2023-01-03 ENCOUNTER — Ambulatory Visit (HOSPITAL_COMMUNITY)
Admission: RE | Admit: 2023-01-03 | Discharge: 2023-01-03 | Disposition: A | Discharge status: Home / Self Care | Payer: 59 | Source: Ambulatory Visit | Attending: Vascular Surgery | Admitting: Vascular Surgery

## 2023-01-03 ENCOUNTER — Ambulatory Visit (INDEPENDENT_AMBULATORY_CARE_PROVIDER_SITE_OTHER): Payer: 59 | Admitting: Vascular Surgery

## 2023-01-03 VITALS — BP 131/83 | HR 92 | Temp 97.9°F | Resp 20 | Ht 60.0 in | Wt 141.0 lb

## 2023-01-03 DIAGNOSIS — I872 Venous insufficiency (chronic) (peripheral): Secondary | ICD-10-CM | POA: Diagnosis not present

## 2023-01-03 DIAGNOSIS — M7989 Other specified soft tissue disorders: Secondary | ICD-10-CM

## 2023-01-03 DIAGNOSIS — M79606 Pain in leg, unspecified: Secondary | ICD-10-CM | POA: Insufficient documentation

## 2023-01-03 LAB — VAS US ABI WITH/WO TBI
Left ABI: 1.09
Right ABI: 1.02

## 2023-01-03 NOTE — Progress Notes (Signed)
ASSESSMENT & PLAN   BILATERAL FOOT AND ANKLE SWELLING: I think her ankle swelling and foot swelling is most likely related to some underlying chronic venous insufficiency.  She has some mild hyperpigmentation.  She sits a fair amount with her legs dependent.  I have encouraged her to avoid prolonged sitting and standing.  We have discussed the importance of exercise.  Will also fit her for a knee-high compression stocking with a gradient of 15 to 20 mmHg.  We have discussed the importance of daily leg elevation and the proper positioning for this.  I reassured her that she has no evidence of significant arterial insufficiency.  We have discussed the importance of tobacco cessation.  Her arterial Doppler study was normal.  I be happy to see her back at any time if any new vascular issues arise.  REASON FOR CONSULT:    Claudication and diminished pulses.  The consult is requested by Dr. Annamary Rummage.   HPI:   Connie Coffey is a 66 y.o. female who is referred with foot and ankle swelling and absent pulses.  The patient has had swelling in her feet and ankles for about 3 months now.  This came on gradually.  She denies any previous history of DVT.  She does sit with her legs dependent quite a bit.  She has had back surgery and has a spinal stimulator in her back and her activity is fairly limited.  I do not get any history of claudication.  However again her activity is fairly limited.  She denies any history of rest pain.  She does get cramps in her feet at night sometimes.  She does not wear compression stockings.  She smokes a half a pack per day of cigarettes and has been smoking since she was 67 years old.  She also has hypercholesterolemia.  She denies any history of diabetes, hypertension, or family history of premature cardiovascular disease.  Past Medical History:  Diagnosis Date   Adrenal insufficiency (HCC)    Anxiety    Arthritis    Carpal tunnel syndrome    Both hands, worse in right    Chronic constipation    COPD (chronic obstructive pulmonary disease) (HCC)    CVA (cerebral vascular accident) (HCC)    Depression    Facet syndrome    Fibromyalgia    GERD (gastroesophageal reflux disease)    Heart murmur    Hypercholesteremia    IBS (irritable bowel syndrome)    Ischemic stroke (HCC)    07/19/2004   Lumbar radiculopathy    Osteoporosis    Radiculopathy, lumbar region 05/02/2021   RLS (restless legs syndrome)    Spinal stenosis    TMJ (temporomandibular joint disorder)    Trochanteric bursitis of both hips    Vascular malformation    spinal cord    Family History  Problem Relation Age of Onset   Supraventricular tachycardia Mother    Dementia Mother    Hyperlipidemia Mother    Hypertension Mother    Osteoarthritis Mother    Osteoporosis Mother    Breast cancer Other    Breast cancer Other    Colon cancer Neg Hx    Esophageal cancer Neg Hx     SOCIAL HISTORY: Social History   Tobacco Use   Smoking status: Every Day    Current packs/day: 0.50    Types: Cigarettes   Smokeless tobacco: Never  Substance Use Topics   Alcohol use: Not Currently    Comment: quit  19 years    Allergies  Allergen Reactions   Beta Vulgaris Anaphylaxis    Beets    Penicillins Shortness Of Breath   Celecoxib Other (See Comments)    "because of stomach ulcers"   Chantix [Varenicline]     Makes pt sick on her stomach    Dog Epithelium (Canis Lupus Familiaris)     Cats and dogs   Fish Oil Other (See Comments)    hemorrhoids   Gramineae Pollens    Molds & Smuts    Nsaids Other (See Comments)    GI Upset. Ulcers   Other Other (See Comments)    beets  Including Celebrex   Tape Dermatitis    Electrodes caused blistering   Nicotine Nausea Only and Other (See Comments)    Current Outpatient Medications  Medication Sig Dispense Refill   Abaloparatide (TYMLOS) 3120 MCG/1.56ML SOPN Inject 80 mcg into the skin daily. 4.68 mL 1   albuterol (VENTOLIN HFA) 108 (90  Base) MCG/ACT inhaler Inhale 2 puffs into the lungs every 4 (four) hours as needed. 54 g 3   ARIPiprazole (ABILIFY) 5 MG tablet TAKE 1 TABLET (5 MG TOTAL) BY MOUTH DAILY. 90 tablet 1   atorvastatin (LIPITOR) 40 MG tablet TAKE 1 TABLET BY MOUTH DAILY 100 tablet 2   Azelastine-Fluticasone 137-50 MCG/ACT SUSP Place 1 spray into the nose every 12 (twelve) hours. 69 g 3   bethanechol (URECHOLINE) 25 MG tablet Take 1 tablet (25 mg total) by mouth in the morning and at bedtime. 180 tablet 1   busPIRone (BUSPAR) 30 MG tablet TAKE 1 TABLET BY MOUTH TWICE A DAY 180 tablet 1   cetirizine (ZYRTEC) 10 MG tablet TAKE 1 TABLET BY MOUTH EVERYDAY AT BEDTIME 90 tablet 2   ciclopirox (PENLAC) 8 % solution Apply topically at bedtime. Apply over nail and surrounding skin. Apply daily over previous coat. After seven (7) days, may remove with alcohol and continue cycle. 6.6 mL 0   cyclobenzaprine (FLEXERIL) 10 MG tablet TAKE 1 TABLET BY MOUTH 3  TIMES DAILY 270 tablet 1   DULoxetine (CYMBALTA) 60 MG capsule TAKE 1 CAPSULE BY MOUTH  DAILY 90 capsule 3   fenofibrate 160 MG tablet TAKE 1 TABLET BY MOUTH EVERY DAY 90 tablet 0   gabapentin (NEURONTIN) 600 MG tablet TAKE 1 TABLET BY MOUTH 4 TIMES  DAILY 400 tablet 2   hydrocortisone (CORTEF) 10 MG tablet Take 10 mg by mouth 2 (two) times daily.     hydrOXYzine (VISTARIL) 25 MG capsule TAKE 1 CAPSULE (25 MG TOTAL) BY MOUTH EVERY 8 (EIGHT) HOURS AS NEEDED FOR ANXIETY OR ITCHING. 270 capsule 0   Insulin Pen Needle (GLOBAL EASY GLIDE PEN NEEDLES) 32G X 4 MM MISC Use one needle daily as directed     levocetirizine (XYZAL) 5 MG tablet TAKE 1 TABLET BY MOUTH EVERY DAY IN THE EVENING 90 tablet 3   levothyroxine (SYNTHROID) 75 MCG tablet TAKE 1 TABLET BY MOUTH EVERY DAY BEFORE BREAKFAST 90 tablet 2   lubiprostone (AMITIZA) 8 MCG capsule TAKE 1 CAPSULE BY MOUTH TWICE  DAILY WITH MEALS 120 capsule 5   montelukast (SINGULAIR) 10 MG tablet TAKE 1 TABLET BY MOUTH DAILY 100 tablet 2    Multiple Vitamin (MULTI-VITAMINS) TABS Take 1 tablet by mouth daily.     nystatin (MYCOSTATIN) 100000 UNIT/ML suspension Take 5 mLs (500,000 Units total) by mouth 4 (four) times daily. 60 mL 2   olopatadine (PATANOL) 0.1 % ophthalmic solution Place 1 drop  into both eyes daily as needed for allergies.     oxyCODONE-acetaminophen (PERCOCET) 10-325 MG tablet TAKE 1 TAB EVERY 4 HOURS FOR 5 DAYS THEN 1 TAB TWICE A DAY AS NEEDED FOR SEVERE BREAKTHROUGH PAIN  0   pantoprazole (PROTONIX) 40 MG tablet TAKE 1 TABLET BY MOUTH TWICE A DAY 180 tablet 1   primidone (MYSOLINE) 50 MG tablet TAKE 1 TABLET BY MOUTH TWICE A DAY 180 tablet 1   promethazine-dextromethorphan (PROMETHAZINE-DM) 6.25-15 MG/5ML syrup Take 5 mLs by mouth 4 (four) times daily as needed for cough. 180 mL 0   Propylene Glycol (SYSTANE BALANCE) 0.6 % SOLN Place 1 drop into both eyes daily as needed (dry eyes).     Tiotropium Bromide Monohydrate (SPIRIVA RESPIMAT) 2.5 MCG/ACT AERS USE 1 INHALATION BY MOUTH  TWICE DAILY 12 g 3   torsemide (DEMADEX) 10 MG tablet TAKE 1 TABLET BY MOUTH EVERY DAY 30 tablet 0   urea (CARMOL) 10 % cream Apply topically as needed. 71 g 0   Vitamin D, Ergocalciferol, (DRISDOL) 50000 units CAPS capsule Take 50,000 Units by mouth every Wednesday.     No current facility-administered medications for this visit.    REVIEW OF SYSTEMS:  [X]  denotes positive finding, [ ]  denotes negative finding Cardiac  Comments:  Chest pain or chest pressure:    Shortness of breath upon exertion:    Short of breath when lying flat:    Irregular heart rhythm:        Vascular    Pain in calf, thigh, or hip brought on by ambulation:    Pain in feet at night that wakes you up from your sleep:     Blood clot in your veins:    Leg swelling:  x       Pulmonary    Oxygen at home:    Productive cough:     Wheezing:  x       Neurologic    Sudden weakness in arms or legs:     Sudden numbness in arms or legs:     Sudden onset of  difficulty speaking or slurred speech:    Temporary loss of vision in one eye:     Problems with dizziness:         Gastrointestinal    Blood in stool:     Vomited blood:         Genitourinary    Burning when urinating:     Blood in urine:        Psychiatric    Major depression:         Hematologic    Bleeding problems:    Problems with blood clotting too easily:        Skin    Rashes or ulcers:        Constitutional    Fever or chills:    -  PHYSICAL EXAM:   Vitals:   01/03/23 1030  BP: 131/83  Pulse: 92  Resp: 20  Temp: 97.9 F (36.6 C)  SpO2: 93%  Weight: 141 lb (64 kg)  Height: 5' (1.524 m)   Body mass index is 27.54 kg/m. GENERAL: The patient is a well-nourished female, in no acute distress. The vital signs are documented above. CARDIAC: There is a regular rate and rhythm.  VASCULAR: I do not detect carotid bruits. She has palpable femoral pulses. I cannot palpate pedal pulses. PULMONARY: There is good air exchange bilaterally.  She does have wheezing bilaterally. ABDOMEN: Soft and non-tender  with normal pitched bowel sounds.  MUSCULOSKELETAL: There are no major deformities. NEUROLOGIC: No focal weakness or paresthesias are detected. SKIN: There are no ulcers or rashes noted. PSYCHIATRIC: The patient has a normal affect.  DATA:    ARTERIAL DOPPLER STUDY: I have independently interpreted her arterial Doppler study today.  On the right side there is a triphasic posterior tibial signal with a biphasic dorsalis pedis signal.  ABIs 100%.  Toe pressures 117 mmHg.  On the left side there is a triphasic posterior tibial signal with a biphasic dorsalis pedis signal.  ABIs 100%.  Toe pressures 112 mmHg.  Waverly Ferrari [facility]

## 2023-01-06 ENCOUNTER — Other Ambulatory Visit: Payer: Self-pay | Admitting: Family Medicine

## 2023-01-06 DIAGNOSIS — F331 Major depressive disorder, recurrent, moderate: Secondary | ICD-10-CM

## 2023-01-14 ENCOUNTER — Ambulatory Visit (INDEPENDENT_AMBULATORY_CARE_PROVIDER_SITE_OTHER): Payer: 59 | Admitting: Family Medicine

## 2023-01-14 ENCOUNTER — Encounter: Payer: Self-pay | Admitting: Family Medicine

## 2023-01-14 VITALS — BP 118/70 | HR 86 | Temp 98.3°F | Ht 61.5 in | Wt 141.0 lb

## 2023-01-14 DIAGNOSIS — J441 Chronic obstructive pulmonary disease with (acute) exacerbation: Secondary | ICD-10-CM | POA: Diagnosis not present

## 2023-01-14 DIAGNOSIS — J018 Other acute sinusitis: Secondary | ICD-10-CM

## 2023-01-14 DIAGNOSIS — R051 Acute cough: Secondary | ICD-10-CM | POA: Diagnosis not present

## 2023-01-14 DIAGNOSIS — J45909 Unspecified asthma, uncomplicated: Secondary | ICD-10-CM | POA: Diagnosis not present

## 2023-01-14 DIAGNOSIS — R0602 Shortness of breath: Secondary | ICD-10-CM | POA: Diagnosis not present

## 2023-01-14 DIAGNOSIS — R059 Cough, unspecified: Secondary | ICD-10-CM | POA: Diagnosis not present

## 2023-01-14 DIAGNOSIS — R918 Other nonspecific abnormal finding of lung field: Secondary | ICD-10-CM | POA: Diagnosis not present

## 2023-01-14 LAB — POC COVID19 BINAXNOW: SARS Coronavirus 2 Ag: NEGATIVE

## 2023-01-14 MED ORDER — PROMETHAZINE-DM 6.25-15 MG/5ML PO SYRP
5.0000 mL | ORAL_SOLUTION | Freq: Four times a day (QID) | ORAL | 0 refills | Status: DC | PRN
Start: 2023-01-14 — End: 2023-03-28

## 2023-01-14 MED ORDER — DOXYCYCLINE HYCLATE 100 MG PO TABS
100.0000 mg | ORAL_TABLET | Freq: Two times a day (BID) | ORAL | 0 refills | Status: AC
Start: 2023-01-14 — End: 2023-01-24

## 2023-01-14 NOTE — Assessment & Plan Note (Signed)
Covid test negative  Chest x-ray ordered to rule out Pneumonia Refill of cough medication (Promethazine-DM) by mouth 4 times daily as needed.

## 2023-01-14 NOTE — Assessment & Plan Note (Addendum)
Doxycycline 100 mg BID for 10 days Continue to take allergy medications as prescribed.

## 2023-01-14 NOTE — Progress Notes (Signed)
Acute Office Visit  Subjective:    Patient ID: Connie Coffey, female    DOB: 1956-01-25, 67 y.o.   MRN: [id number]  Chief Complaint  Patient presents with   COPD   Cough    HPI: Patient is in today for cough. Covid test negative. States she coughed all weeked. Reports she has been having this issue for months. C/o congestion, cough, PND, sinus pain and pressure. Currently taking xyzal and singulair. Has been using her rescue inhaler. Still smoking.  Past Medical History:  Diagnosis Date   Adrenal insufficiency (HCC)    Anxiety    Arthritis    Carpal tunnel syndrome    Both hands, worse in right   Chronic constipation    COPD (chronic obstructive pulmonary disease) (HCC)    CVA (cerebral vascular accident) (HCC)    Depression    Facet syndrome    Fibromyalgia    GERD (gastroesophageal reflux disease)    Heart murmur    Hypercholesteremia    IBS (irritable bowel syndrome)    Ischemic stroke (HCC)    07/19/2004   Lumbar radiculopathy    Osteoporosis    Radiculopathy, lumbar region 05/02/2021   RLS (restless legs syndrome)    Spinal stenosis    TMJ (temporomandibular joint disorder)    Trochanteric bursitis of both hips    Vascular malformation    spinal cord    Past Surgical History:  Procedure Laterality Date   APPENDECTOMY     CARPAL TUNNEL RELEASE Right 12/13/2017   surgery   CATARACT EXTRACTION Bilateral    CHOLECYSTECTOMY     COLONOSCOPY  05/06/2017   Colonic polyps status post polypectomy. Interan land external hemorrhoids.    ESOPHAGOGASTRODUODENOSCOPY  04/28/2007   Mild gastrtiis. Otherwise normal EGD.    HEMORROIDECTOMY     LAMINECTOMY AND MICRODISCECTOMY LUMBAR SPINE  01/03/2005   NECK SURGERY     x2    OTHER SURGICAL HISTORY  04/24/2004   anterior cervical discectomy and fusion at C5-C6   SPINAL CORD STIMULATOR INSERTION N/A 07/21/2021   Procedure: Permanant Spinal cord stimulator placement leads and battery under fluroscopy;  Surgeon: Renaldo Fiddler, MD;  Location: [facility];  Service: Neurosurgery;  Laterality: N/A;   TONSILLECTOMY     TOTAL ABDOMINAL HYSTERECTOMY      Family History  Problem Relation Age of Onset   Supraventricular tachycardia Mother    Dementia Mother    Hyperlipidemia Mother    Hypertension Mother    Osteoarthritis Mother    Osteoporosis Mother    Breast cancer Other    Breast cancer Other    Colon cancer Neg Hx    Esophageal cancer Neg Hx     Social History   Socioeconomic History   Marital status: Divorced    Spouse name: Not on file   Number of children: 1   Years of education: Not on file   Highest education level: Not on file  Occupational History   Occupation: Disabled  Tobacco Use   Smoking status: Every Day    Current packs/day: 0.50    Types: Cigarettes   Smokeless tobacco: Never  Vaping Use   Vaping status: Never Used  Substance and Sexual Activity   Alcohol use: Not Currently    Comment: quit 19 years   Drug use: Never   Sexual activity: Not Currently  Other Topics Concern   Not on file  Social History Narrative   Disabled son lives with her sometimes and his  father sometimes.  Mallory is working hard to have a relationship with her son's father and his wife.  They help her when needed.   Social Determinants of Health   Financial Resource Strain: Low Risk  (11/15/2021)   Overall Financial Resource Strain (CARDIA)    Difficulty of Paying Living Expenses: Not hard at all  Food Insecurity: No Food Insecurity (07/18/2022)   Hunger Vital Sign    Worried About Running Out of Food in the Last Year: Never true    Ran Out of Food in the Last Year: Never true  Transportation Needs: No Transportation Needs (07/18/2022)   PRAPARE - Administrator, Civil Service (Medical): No    Lack of Transportation (Non-Medical): No  Physical Activity: Inactive (01/14/2023)   Exercise Vital Sign    Days of Exercise per Week: 0 days    Minutes of Exercise per Session: 0 min  Stress: No  Stress Concern Present (07/18/2022)   [employer] of Occupational Health - Occupational Stress Questionnaire    Feeling of Stress : Only a little  Social Connections: Socially Isolated (01/14/2023)   Social Connection and Isolation Panel [NHANES]    Frequency of Communication with Friends and Family: Three times a week    Frequency of Social Gatherings with Friends and Family: Three times a week    Attends Religious Services: Never    Active Member of Clubs or Organizations: No    Attends Banker Meetings: Never    Marital Status: Divorced  Catering manager Violence: Not At Risk (07/18/2022)   Humiliation, Afraid, Rape, and Kick questionnaire    Fear of Current or Ex-Partner: No    Emotionally Abused: No    Physically Abused: No    Sexually Abused: No    Outpatient Medications Prior to Visit  Medication Sig Dispense Refill   Abaloparatide (TYMLOS) 3120 MCG/1.56ML SOPN Inject 80 mcg into the skin daily. 4.68 mL 1   albuterol (VENTOLIN HFA) 108 (90 Base) MCG/ACT inhaler Inhale 2 puffs into the lungs every 4 (four) hours as needed. 54 g 3   ARIPiprazole (ABILIFY) 5 MG tablet TAKE 1 TABLET (5 MG TOTAL) BY MOUTH DAILY. 90 tablet 1   atorvastatin (LIPITOR) 40 MG tablet TAKE 1 TABLET BY MOUTH DAILY 100 tablet 2   Azelastine-Fluticasone 137-50 MCG/ACT SUSP Place 1 spray into the nose every 12 (twelve) hours. 69 g 3   bethanechol (URECHOLINE) 25 MG tablet Take 1 tablet (25 mg total) by mouth in the morning and at bedtime. 180 tablet 1   busPIRone (BUSPAR) 30 MG tablet TAKE 1 TABLET BY MOUTH TWICE A DAY 180 tablet 1   cetirizine (ZYRTEC) 10 MG tablet TAKE 1 TABLET BY MOUTH EVERYDAY AT BEDTIME 90 tablet 2   ciclopirox (PENLAC) 8 % solution Apply topically at bedtime. Apply over nail and surrounding skin. Apply daily over previous coat. After seven (7) days, may remove with alcohol and continue cycle. 6.6 mL 0   cyclobenzaprine (FLEXERIL) 10 MG tablet TAKE 1 TABLET BY MOUTH 3  TIMES  DAILY 270 tablet 1   DULoxetine (CYMBALTA) 60 MG capsule TAKE 1 CAPSULE BY MOUTH  DAILY 90 capsule 3   fenofibrate 160 MG tablet TAKE 1 TABLET BY MOUTH EVERY DAY 90 tablet 0   gabapentin (NEURONTIN) 600 MG tablet TAKE 1 TABLET BY MOUTH 4 TIMES  DAILY 400 tablet 2   hydrocortisone (CORTEF) 10 MG tablet Take 10 mg by mouth 2 (two) times daily.  hydrOXYzine (VISTARIL) 25 MG capsule TAKE 1 CAPSULE (25 MG TOTAL) BY MOUTH EVERY 8 (EIGHT) HOURS AS NEEDED FOR ANXIETY OR ITCHING. 270 capsule 0   Insulin Pen Needle (GLOBAL EASY GLIDE PEN NEEDLES) 32G X 4 MM MISC Use one needle daily as directed     levocetirizine (XYZAL) 5 MG tablet TAKE 1 TABLET BY MOUTH EVERY DAY IN THE EVENING 90 tablet 3   levothyroxine (SYNTHROID) 75 MCG tablet TAKE 1 TABLET BY MOUTH EVERY DAY BEFORE BREAKFAST 90 tablet 2   lubiprostone (AMITIZA) 8 MCG capsule TAKE 1 CAPSULE BY MOUTH TWICE  DAILY WITH MEALS 120 capsule 5   montelukast (SINGULAIR) 10 MG tablet TAKE 1 TABLET BY MOUTH DAILY 100 tablet 2   Multiple Vitamin (MULTI-VITAMINS) TABS Take 1 tablet by mouth daily.     nystatin (MYCOSTATIN) 100000 UNIT/ML suspension Take 5 mLs (500,000 Units total) by mouth 4 (four) times daily. 60 mL 2   olopatadine (PATANOL) 0.1 % ophthalmic solution Place 1 drop into both eyes daily as needed for allergies.     oxyCODONE-acetaminophen (PERCOCET) 10-325 MG tablet TAKE 1 TAB EVERY 4 HOURS FOR 5 DAYS THEN 1 TAB TWICE A DAY AS NEEDED FOR SEVERE BREAKTHROUGH PAIN  0   pantoprazole (PROTONIX) 40 MG tablet TAKE 1 TABLET BY MOUTH TWICE A DAY 180 tablet 1   primidone (MYSOLINE) 50 MG tablet TAKE 1 TABLET BY MOUTH TWICE A DAY 180 tablet 1   Propylene Glycol (SYSTANE BALANCE) 0.6 % SOLN Place 1 drop into both eyes daily as needed (dry eyes).     Tiotropium Bromide Monohydrate (SPIRIVA RESPIMAT) 2.5 MCG/ACT AERS USE 1 INHALATION BY MOUTH  TWICE DAILY 12 g 3   torsemide (DEMADEX) 10 MG tablet TAKE 1 TABLET BY MOUTH EVERY DAY 30 tablet 0   urea (CARMOL)  10 % cream Apply topically as needed. 71 g 0   Vitamin D, Ergocalciferol, (DRISDOL) 50000 units CAPS capsule Take 50,000 Units by mouth every Wednesday.     promethazine-dextromethorphan (PROMETHAZINE-DM) 6.25-15 MG/5ML syrup Take 5 mLs by mouth 4 (four) times daily as needed for cough. 180 mL 0   No facility-administered medications prior to visit.    Allergies  Allergen Reactions   Beta Vulgaris Anaphylaxis    Beets    Penicillins Shortness Of Breath   Celecoxib Other (See Comments)    "because of stomach ulcers"   Chantix [Varenicline]     Makes pt sick on her stomach    Dog Epithelium (Canis Lupus Familiaris)     Cats and dogs   Fish Oil Other (See Comments)    hemorrhoids   Gramineae Pollens    Molds & Smuts    Nsaids Other (See Comments)    GI Upset. Ulcers   Other Other (See Comments)    beets  Including Celebrex   Tape Dermatitis    Electrodes caused blistering   Nicotine Nausea Only and Other (See Comments)    Review of Systems  Constitutional:  Negative for chills, fatigue and fever.  HENT:  Positive for congestion, postnasal drip, sinus pressure and sinus pain. Negative for ear pain, rhinorrhea, sneezing and sore throat.   Respiratory:  Positive for cough. Negative for shortness of breath.   Cardiovascular:  Negative for chest pain.  Gastrointestinal:  Negative for diarrhea, nausea and vomiting.  Neurological:  Negative for headaches.       Objective:        01/14/2023   11:24 AM 01/03/2023   10:30 AM 11/02/2022  10:13 AM  Vitals with BMI  Height 5' 1.5" 5\' 0"  5\' 0"   Weight 141 lbs 141 lbs 139 lbs 13 oz  BMI 26.21 27.54 27.3  Systolic 118 131 161  Diastolic 70 83 68  Pulse 86 92 88    No data found.   Physical Exam Constitutional:      Appearance: Normal appearance.  HENT:     Head: Normocephalic. Right periorbital erythema present.     Jaw: There is normal jaw occlusion.     Right Ear: Tympanic membrane normal.     Left Ear: Tympanic  membrane normal.     Nose: Congestion present.     Right Turbinates: Swollen.     Left Turbinates: Swollen.     Right Sinus: Maxillary sinus tenderness present.     Mouth/Throat:     Lips: Pink.     Mouth: Mucous membranes are moist.     Dentition: Has dentures.     Tongue: Lesions (consistent with thrush) present.  Eyes:     General:        Right eye: Discharge (watery) present.  Cardiovascular:     Rate and Rhythm: Normal rate and regular rhythm.  Pulmonary:     Breath sounds: Wheezing (expiratory) present.  Skin:    General: Skin is warm.  Neurological:     General: No focal deficit present.     Mental Status: She is alert.  Psychiatric:        Mood and Affect: Mood normal.     Health Maintenance Due  Topic Date Due   Zoster Vaccines- Shingrix (1 of 2) Never done   COVID-19 Vaccine (5 - 2023-24 season) 05/14/2022   INFLUENZA VACCINE  12/27/2022    There are no preventive care reminders to display for this patient.   Lab Results  Component Value Date   TSH 0.688 10/08/2022   Lab Results  Component Value Date   WBC 11.6 (H) 10/08/2022   HGB 14.2 10/08/2022   HCT 43.1 10/08/2022   MCV 86 10/08/2022   PLT 492 (H) 10/08/2022   Lab Results  Component Value Date   NA 143 12/03/2022   K 4.7 12/03/2022   CO2 30 (H) 12/03/2022   GLUCOSE 77 12/03/2022   BUN 17 12/03/2022   CREATININE 0.88 12/03/2022   BILITOT <0.2 12/03/2022   ALKPHOS 53 12/03/2022   AST 28 12/03/2022   ALT 29 12/03/2022   PROT 7.0 12/03/2022   ALBUMIN 4.6 12/03/2022   CALCIUM 9.9 12/03/2022   ANIONGAP 8 07/21/2021   EGFR 72 12/03/2022   Lab Results  Component Value Date   CHOL 189 07/16/2022   Lab Results  Component Value Date   HDL 102 07/16/2022   Lab Results  Component Value Date   LDLCALC 70 07/16/2022   Lab Results  Component Value Date   TRIG 97 07/16/2022   Lab Results  Component Value Date   CHOLHDL 1.9 07/16/2022   No results found for: "HGBA1C"      Assessment & Plan:  Acute cough Assessment & Plan: Covid test negative  Chest x-ray ordered to rule out Pneumonia Refill of cough medication (Promethazine-DM) by mouth 4 times daily as needed.   Orders: -     POC COVID-19 BinaxNow -     DG Chest 2 View; Future  COPD exacerbation (HCC) Assessment & Plan: Chest x-ray ordered to rule out pneumonia.   Orders: -     DG Chest 2 View; Future -  Promethazine-DM; Take 5 mLs by mouth 4 (four) times daily as needed for cough.  Dispense: 180 mL; Refill: 0  Acute non-recurrent sinusitis of other sinus Assessment & Plan: Doxycycline 100 mg BID for 10 days Continue to take allergy medications as prescribed.   Orders: -     Doxycycline Hyclate; Take 1 tablet (100 mg total) by mouth 2 (two) times daily for 10 days.  Dispense: 20 tablet; Refill: 0     Meds ordered this encounter  Medications   doxycycline (VIBRA-TABS) 100 MG tablet    Sig: Take 1 tablet (100 mg total) by mouth 2 (two) times daily for 10 days.    Dispense:  20 tablet    Refill:  0   promethazine-dextromethorphan (PROMETHAZINE-DM) 6.25-15 MG/5ML syrup    Sig: Take 5 mLs by mouth 4 (four) times daily as needed for cough.    Dispense:  180 mL    Refill:  0    Orders Placed This Encounter  Procedures   DG Chest 2 View   POC COVID-19    Total time spent on today's visit was greater than 30 minutes, including both face-to-face time and nonface-to-face time personally spent on review of chart (labs and imaging), discussing labs and goals, discussing further work-up, treatment options, referrals to specialist if needed, reviewing outside records of pertinent, answering patient's questions, and coordinating care.  Follow-up: Return if symptoms worsen or fail to improve.  An After Visit Summary was printed and given to the patient.  Renne Crigler, FNP [facility] [phone]

## 2023-01-14 NOTE — Assessment & Plan Note (Signed)
Chest x-ray ordered to rule out pneumonia

## 2023-01-17 DIAGNOSIS — M5414 Radiculopathy, thoracic region: Secondary | ICD-10-CM | POA: Diagnosis not present

## 2023-01-23 ENCOUNTER — Other Ambulatory Visit: Payer: Self-pay | Admitting: Podiatry

## 2023-01-23 ENCOUNTER — Other Ambulatory Visit: Payer: Self-pay | Admitting: Family Medicine

## 2023-01-23 DIAGNOSIS — R600 Localized edema: Secondary | ICD-10-CM

## 2023-01-24 DIAGNOSIS — L03116 Cellulitis of left lower limb: Secondary | ICD-10-CM | POA: Diagnosis not present

## 2023-01-25 ENCOUNTER — Other Ambulatory Visit: Payer: Self-pay | Admitting: Family Medicine

## 2023-01-29 ENCOUNTER — Encounter: Payer: Self-pay | Admitting: Family Medicine

## 2023-01-31 ENCOUNTER — Ambulatory Visit: Payer: Self-pay

## 2023-01-31 ENCOUNTER — Other Ambulatory Visit: Payer: Self-pay | Admitting: Physician Assistant

## 2023-01-31 DIAGNOSIS — L0390 Cellulitis, unspecified: Secondary | ICD-10-CM

## 2023-01-31 MED ORDER — CIPROFLOXACIN HCL 500 MG PO TABS
500.0000 mg | ORAL_TABLET | Freq: Two times a day (BID) | ORAL | 0 refills | Status: DC
Start: 2023-01-31 — End: 2023-02-11

## 2023-01-31 NOTE — Patient Outreach (Signed)
  Care Coordination   Follow Up Visit Note   01/31/2023 Name: Connie Coffey MRN: [id number] DOB: 1956-01-04  Connie Coffey is a 66 y.o. year old female who sees Cox, Kirsten, MD for primary care. I spoke with  Connie Coffey by phone today.  What matters to the patients health and wellness today?  Patient reports that she is just leaving the MD office.  Reports worsening swelling and redness.  Reports urgent care thinks that it is a bug bite and MD office thinks it is from a cat scratch from 3-4 weeks ago.  Pain today in foot 3/10 if not walking on it.  Back pain is 6/10.  Reports injection was not helpful at this time.  No recent falls.  Reports low energy.  Reports decrease sleep ( about 2-3 hours ) per night. Reports good nutrition.    Goals Addressed               This Visit's Progress     Chronic back pain and lack of energy (pt-stated)        Interventions Today    Flowsheet Row Most Recent Value  Chronic Disease   Chronic disease during today's visit Other  [Chronic pain and low energy]  General Interventions   General Interventions Discussed/Reviewed General Interventions Discussed, General Interventions Reviewed, Doctor Visits  Doctor Visits Discussed/Reviewed Doctor Visits Reviewed, PCP, Specialist  Exercise Interventions   Exercise Discussed/Reviewed Physical Activity  Education Interventions   Education Provided Provided Education  [reviewed signs and symptoms of infection. Encouraged patient to call MD for fever or worsening condition.]  Provided Verbal Education On Nutrition, Foot Care, Medication, When to see the doctor  Nutrition Interventions   Nutrition Discussed/Reviewed Nutrition Discussed  [Encouraged good nutrition]  Pharmacy Interventions   Pharmacy Dicussed/Reviewed Medications and their functions  Safety Interventions   Safety Discussed/Reviewed Fall Risk     Encouraged patient to discuss sleep concerns with MD.         SDOH assessments and  interventions completed:  No     Care Coordination Interventions:  Yes, provided   Follow up plan: Follow up call scheduled for 02/28/2023    Encounter Outcome:  Patient Visit Completed   Rowe Pavy, RN, BSN, CEN [facility] Coordinator [phone]

## 2023-02-01 ENCOUNTER — Other Ambulatory Visit: Payer: Self-pay

## 2023-02-04 ENCOUNTER — Telehealth: Payer: Self-pay

## 2023-02-04 NOTE — Telephone Encounter (Signed)
Patient called requesting refill of Omeprazole - patient voiced her aggravation that she has called multiple times to request this medication.  Upon looking, this medication was discontinued last year and replaced with Protonix.  Protonix was sent to [facility] in October, February, as well as July.  Patient states that she has never seen a medication with that name nor called Pantoprazole.  Patient is going to call [facility].

## 2023-02-11 ENCOUNTER — Encounter: Payer: Self-pay | Admitting: Physician Assistant

## 2023-02-11 ENCOUNTER — Ambulatory Visit: Payer: 59 | Admitting: Physician Assistant

## 2023-02-11 VITALS — BP 124/70 | HR 88 | Temp 96.1°F | Ht 60.0 in | Wt 143.0 lb

## 2023-02-11 DIAGNOSIS — L03116 Cellulitis of left lower limb: Secondary | ICD-10-CM

## 2023-02-11 DIAGNOSIS — R0989 Other specified symptoms and signs involving the circulatory and respiratory systems: Secondary | ICD-10-CM

## 2023-02-11 NOTE — Assessment & Plan Note (Signed)
Sent for brachial radial index Will refer to vascular surgery depending on results

## 2023-02-11 NOTE — Progress Notes (Signed)
Acute Office Visit  Subjective:    Patient ID: Connie Coffey, female    DOB: 06-06-1955, 67 y.o.   MRN: [id number]  Chief Complaint  Patient presents with   Cellulitis    HPI: Patient is in today for recheck of left lower leg and foot.  She completed her antibiotics this morning. She has been wearing the compression stockings and elevating her legs when possible. Patient showed me at the last appointment with her son that she had a possible cellulitis and that she completed antibiotics from the urgent care and it was still red and painful. Patient states the redness around the wounds has gotten better and denies any major pain in her legs. States that she has still noticed an increase in swelling that is prominent around her left foot more so than her right. She states that her left foot has gotten more purple since last seeing her. Admits to both feet also being cold chronically with difficulty getting them warm.    Past Medical History:  Diagnosis Date   Adrenal insufficiency (HCC)    Anxiety    Arthritis    Carpal tunnel syndrome    Both hands, worse in right   Chronic constipation    COPD (chronic obstructive pulmonary disease) (HCC)    CVA (cerebral vascular accident) (HCC)    Depression    Facet syndrome    Fibromyalgia    GERD (gastroesophageal reflux disease)    Heart murmur    Hypercholesteremia    IBS (irritable bowel syndrome)    Ischemic stroke (HCC)    07/19/2004   Lumbar radiculopathy    Osteoporosis    Radiculopathy, lumbar region 05/02/2021   RLS (restless legs syndrome)    Spinal stenosis    TMJ (temporomandibular joint disorder)    Trochanteric bursitis of both hips    Vascular malformation    spinal cord    Past Surgical History:  Procedure Laterality Date   APPENDECTOMY     CARPAL TUNNEL RELEASE Right 12/13/2017   surgery   CATARACT EXTRACTION Bilateral    CHOLECYSTECTOMY     COLONOSCOPY  05/06/2017   Colonic polyps status post polypectomy.  Interan land external hemorrhoids.    ESOPHAGOGASTRODUODENOSCOPY  04/28/2007   Mild gastrtiis. Otherwise normal EGD.    HEMORROIDECTOMY     LAMINECTOMY AND MICRODISCECTOMY LUMBAR SPINE  01/03/2005   NECK SURGERY     x2    OTHER SURGICAL HISTORY  04/24/2004   anterior cervical discectomy and fusion at C5-C6   SPINAL CORD STIMULATOR INSERTION N/A 07/21/2021   Procedure: Permanant Spinal cord stimulator placement leads and battery under fluroscopy;  Surgeon: Renaldo Fiddler, MD;  Location: [facility] OR;  Service: Neurosurgery;  Laterality: N/A;   TONSILLECTOMY     TOTAL ABDOMINAL HYSTERECTOMY      Family History  Problem Relation Age of Onset   Supraventricular tachycardia Mother    Dementia Mother    Hyperlipidemia Mother    Hypertension Mother    Osteoarthritis Mother    Osteoporosis Mother    Breast cancer Other    Breast cancer Other    Colon cancer Neg Hx    Esophageal cancer Neg Hx     Social History   Socioeconomic History   Marital status: Divorced    Spouse name: Not on file   Number of children: 1   Years of education: Not on file   Highest education level: Not on file  Occupational History   Occupation: Disabled  Tobacco Use   Smoking status: Every Day    Current packs/day: 0.50    Types: Cigarettes   Smokeless tobacco: Never  Vaping Use   Vaping status: Never Used  Substance and Sexual Activity   Alcohol use: Not Currently    Comment: quit 19 years   Drug use: Never   Sexual activity: Not Currently  Other Topics Concern   Not on file  Social History Narrative   Disabled son lives with her sometimes and his father sometimes.  Keola is working hard to have a relationship with her son's father and his wife.  They help her when needed.   Social Determinants of Health   Financial Resource Strain: Low Risk  (11/15/2021)   Overall Financial Resource Strain (CARDIA)    Difficulty of Paying Living Expenses: Not hard at all  Food Insecurity: No Food Insecurity  (07/18/2022)   Hunger Vital Sign    Worried About Running Out of Food in the Last Year: Never true    Ran Out of Food in the Last Year: Never true  Transportation Needs: No Transportation Needs (07/18/2022)   PRAPARE - Administrator, Civil Service (Medical): No    Lack of Transportation (Non-Medical): No  Physical Activity: Inactive (01/14/2023)   Exercise Vital Sign    Days of Exercise per Week: 0 days    Minutes of Exercise per Session: 0 min  Stress: No Stress Concern Present (07/18/2022)   [employer] of Occupational Health - Occupational Stress Questionnaire    Feeling of Stress : Only a little  Social Connections: Socially Isolated (01/14/2023)   Social Connection and Isolation Panel [NHANES]    Frequency of Communication with Friends and Family: Three times a week    Frequency of Social Gatherings with Friends and Family: Three times a week    Attends Religious Services: Never    Active Member of Clubs or Organizations: No    Attends Banker Meetings: Never    Marital Status: Divorced  Catering manager Violence: Not At Risk (07/18/2022)   Humiliation, Afraid, Rape, and Kick questionnaire    Fear of Current or Ex-Partner: No    Emotionally Abused: No    Physically Abused: No    Sexually Abused: No    Outpatient Medications Prior to Visit  Medication Sig Dispense Refill   Abaloparatide (TYMLOS) 3120 MCG/1.56ML SOPN Inject 80 mcg into the skin daily. 4.68 mL 1   albuterol (VENTOLIN HFA) 108 (90 Base) MCG/ACT inhaler Inhale 2 puffs into the lungs every 4 (four) hours as needed. 54 g 3   ARIPiprazole (ABILIFY) 5 MG tablet TAKE 1 TABLET (5 MG TOTAL) BY MOUTH DAILY. 90 tablet 1   atorvastatin (LIPITOR) 40 MG tablet TAKE 1 TABLET BY MOUTH DAILY 100 tablet 2   Azelastine HCl 137 MCG/SPRAY SOLN PLACE 2 SPRAYS INTO THE NOSE IN THE MORNING AND AT BEDTIME. 30 mL 3   bethanechol (URECHOLINE) 25 MG tablet Take 1 tablet (25 mg total) by mouth in the morning and  at bedtime. 180 tablet 1   busPIRone (BUSPAR) 30 MG tablet TAKE 1 TABLET BY MOUTH TWICE A DAY 180 tablet 1   cetirizine (ZYRTEC) 10 MG tablet TAKE 1 TABLET BY MOUTH EVERYDAY AT BEDTIME 90 tablet 2   ciclopirox (PENLAC) 8 % solution Apply topically at bedtime. Apply over nail and surrounding skin. Apply daily over previous coat. After seven (7) days, may remove with alcohol and continue cycle. 6.6 mL 0   cyclobenzaprine (  FLEXERIL) 10 MG tablet TAKE 1 TABLET BY MOUTH 3  TIMES DAILY 270 tablet 1   DULoxetine (CYMBALTA) 60 MG capsule TAKE 1 CAPSULE BY MOUTH  DAILY 90 capsule 3   fenofibrate 160 MG tablet TAKE 1 TABLET BY MOUTH EVERY DAY 90 tablet 0   fluticasone (FLONASE) 50 MCG/ACT nasal spray SPRAY 2 SPRAYS INTO EACH NOSTRIL EVERY DAY 48 mL 3   gabapentin (NEURONTIN) 600 MG tablet TAKE 1 TABLET BY MOUTH 4 TIMES  DAILY 400 tablet 2   hydrocortisone (CORTEF) 10 MG tablet Take 10 mg by mouth 2 (two) times daily.     hydrOXYzine (VISTARIL) 25 MG capsule TAKE 1 CAPSULE (25 MG TOTAL) BY MOUTH EVERY 8 (EIGHT) HOURS AS NEEDED FOR ANXIETY OR ITCHING. 270 capsule 0   Insulin Pen Needle (GLOBAL EASY GLIDE PEN NEEDLES) 32G X 4 MM MISC Use one needle daily as directed     levocetirizine (XYZAL) 5 MG tablet TAKE 1 TABLET BY MOUTH EVERY DAY IN THE EVENING 90 tablet 3   levothyroxine (SYNTHROID) 75 MCG tablet TAKE 1 TABLET BY MOUTH EVERY DAY BEFORE BREAKFAST 90 tablet 2   lubiprostone (AMITIZA) 8 MCG capsule TAKE 1 CAPSULE BY MOUTH TWICE  DAILY WITH MEALS 120 capsule 5   montelukast (SINGULAIR) 10 MG tablet TAKE 1 TABLET BY MOUTH DAILY 100 tablet 2   Multiple Vitamin (MULTI-VITAMINS) TABS Take 1 tablet by mouth daily.     nystatin (MYCOSTATIN) 100000 UNIT/ML suspension Take 5 mLs (500,000 Units total) by mouth 4 (four) times daily. 60 mL 2   olopatadine (PATANOL) 0.1 % ophthalmic solution Place 1 drop into both eyes daily as needed for allergies.     oxyCODONE-acetaminophen (PERCOCET) 10-325 MG tablet TAKE 1 TAB  EVERY 4 HOURS FOR 5 DAYS THEN 1 TAB TWICE A DAY AS NEEDED FOR SEVERE BREAKTHROUGH PAIN  0   pantoprazole (PROTONIX) 40 MG tablet TAKE 1 TABLET BY MOUTH TWICE A DAY 180 tablet 1   primidone (MYSOLINE) 50 MG tablet TAKE 1 TABLET BY MOUTH TWICE A DAY 180 tablet 1   promethazine-dextromethorphan (PROMETHAZINE-DM) 6.25-15 MG/5ML syrup Take 5 mLs by mouth 4 (four) times daily as needed for cough. 180 mL 0   Propylene Glycol (SYSTANE BALANCE) 0.6 % SOLN Place 1 drop into both eyes daily as needed (dry eyes).     Tiotropium Bromide Monohydrate (SPIRIVA RESPIMAT) 2.5 MCG/ACT AERS USE 1 INHALATION BY MOUTH  TWICE DAILY 12 g 3   torsemide (DEMADEX) 10 MG tablet TAKE 1 TABLET BY MOUTH EVERY DAY 30 tablet 3   urea (CARMOL) 10 % cream APPLY TO AFFECTED AREA EVERY DAY AS NEEDED 85 g 1   Vitamin D, Ergocalciferol, (DRISDOL) 50000 units CAPS capsule Take 50,000 Units by mouth every Wednesday.     ciprofloxacin (CIPRO) 500 MG tablet Take 1 tablet (500 mg total) by mouth 2 (two) times daily. 20 tablet 0   No facility-administered medications prior to visit.    Allergies  Allergen Reactions   Beta Vulgaris Anaphylaxis    Beets    Penicillins Shortness Of Breath   Celecoxib Other (See Comments)    "because of stomach ulcers"   Chantix [Varenicline]     Makes pt sick on her stomach    Dog Epithelium (Canis Lupus Familiaris)     Cats and dogs   Fish Oil Other (See Comments)    hemorrhoids   Gramineae Pollens    Molds & Smuts    Nsaids Other (See Comments)  GI Upset. Ulcers   Other Other (See Comments)    beets  Including Celebrex   Tape Dermatitis    Electrodes caused blistering   Nicotine Nausea Only and Other (See Comments)    Review of Systems  Constitutional:  Negative for chills, fatigue and fever.  HENT:  Negative for sore throat.   Respiratory:  Negative for shortness of breath.   Cardiovascular:  Positive for leg swelling. Negative for chest pain.  Gastrointestinal:  Negative for  vomiting.  Neurological:  Negative for weakness, light-headedness and headaches.  Psychiatric/Behavioral:  Negative for dysphoric mood. The patient is not nervous/anxious.        Objective:        02/11/2023    3:01 PM 01/14/2023   11:24 AM 01/03/2023   10:30 AM  Vitals with BMI  Height 5\' 0"  5' 1.5" 5\' 0"   Weight 143 lbs 141 lbs 141 lbs  BMI 27.93 26.21 27.54  Systolic 124 118 244  Diastolic 70 70 83  Pulse 88 86 92    No data found.   Physical Exam Vitals reviewed.  Constitutional:      Appearance: Normal appearance.  Cardiovascular:     Rate and Rhythm: Normal rate and regular rhythm.     Pulses:          Dorsalis pedis pulses are 0 on the right side and 0 on the left side.       Posterior tibial pulses are 1+ on the right side and 1+ on the left side.     Heart sounds: Normal heart sounds.  Pulmonary:     Effort: Pulmonary effort is normal.     Breath sounds: Normal breath sounds.  Abdominal:     General: Bowel sounds are normal.     Palpations: Abdomen is soft.     Tenderness: There is no abdominal tenderness.  Musculoskeletal:     Right lower leg: 2+ Pitting Edema present.     Left lower leg: 2+ Pitting Edema present.  Neurological:     Mental Status: She is alert and oriented to person, place, and time.  Psychiatric:        Mood and Affect: Mood normal.        Behavior: Behavior normal.     Health Maintenance Due  Topic Date Due   Zoster Vaccines- Shingrix (1 of 2) Never done   Lung Cancer Screening  Never done   INFLUENZA VACCINE  12/27/2022   COVID-19 Vaccine (5 - 2023-24 season) 01/27/2023    There are no preventive care reminders to display for this patient.   Lab Results  Component Value Date   TSH 0.688 10/08/2022   Lab Results  Component Value Date   WBC 11.6 (H) 10/08/2022   HGB 14.2 10/08/2022   HCT 43.1 10/08/2022   MCV 86 10/08/2022   PLT 492 (H) 10/08/2022   Lab Results  Component Value Date   NA 143 12/03/2022   K 4.7  12/03/2022   CO2 30 (H) 12/03/2022   GLUCOSE 77 12/03/2022   BUN 17 12/03/2022   CREATININE 0.88 12/03/2022   BILITOT <0.2 12/03/2022   ALKPHOS 53 12/03/2022   AST 28 12/03/2022   ALT 29 12/03/2022   PROT 7.0 12/03/2022   ALBUMIN 4.6 12/03/2022   CALCIUM 9.9 12/03/2022   ANIONGAP 8 07/21/2021   EGFR 72 12/03/2022   Lab Results  Component Value Date   CHOL 189 07/16/2022   Lab Results  Component Value Date  HDL 102 07/16/2022   Lab Results  Component Value Date   LDLCALC 70 07/16/2022   Lab Results  Component Value Date   TRIG 97 07/16/2022   Lab Results  Component Value Date   CHOLHDL 1.9 07/16/2022   No results found for: "HGBA1C"    Total time spent on today's visit was greater than 30 minutes, including both face-to-face time and nonface-to-face time personally spent on review of chart (labs and imaging), discussing labs and goals, discussing further work-up, treatment options, referrals to specialist if needed, reviewing outside records of pertinent, answering patient's questions, and coordinating care.  Assessment & Plan:  Decreased dorsalis pedis pulse Assessment & Plan: Sent for brachial radial index Will refer to vascular surgery depending on results  Orders: -     Ankle Brachial Index Assessment (BFP)  Cellulitis of left lower extremity Assessment & Plan: Controlled Much better from previous visit with son Continue to monitor No fevers or excessive warmth      No orders of the defined types were placed in this encounter.   Orders Placed This Encounter  Procedures   Ankle Brachial Index Assessment     Follow-up: No follow-ups on file.  An After Visit Summary was printed and given to the patient.  Langley Gauss, [facility] [phone]

## 2023-02-11 NOTE — Assessment & Plan Note (Signed)
Controlled Much better from previous visit with son Continue to monitor No fevers or excessive warmth

## 2023-02-14 ENCOUNTER — Other Ambulatory Visit: Payer: Self-pay | Admitting: Family Medicine

## 2023-02-14 DIAGNOSIS — R600 Localized edema: Secondary | ICD-10-CM

## 2023-02-15 ENCOUNTER — Telehealth: Payer: Self-pay

## 2023-02-15 NOTE — Telephone Encounter (Signed)
Procedure (ankle brachial index) was going to be scheduled, however patient had one recently done in August. Per Huston Foley,  procedure canceled. Patient to continue wearing compression stockings to aid with swelling and discoloration. Patient aware and verbalized understanding.

## 2023-02-23 ENCOUNTER — Other Ambulatory Visit: Payer: Self-pay | Admitting: Family Medicine

## 2023-02-23 DIAGNOSIS — E782 Mixed hyperlipidemia: Secondary | ICD-10-CM

## 2023-02-25 ENCOUNTER — Ambulatory Visit (INDEPENDENT_AMBULATORY_CARE_PROVIDER_SITE_OTHER): Payer: 59 | Admitting: Podiatry

## 2023-02-25 DIAGNOSIS — I739 Peripheral vascular disease, unspecified: Secondary | ICD-10-CM

## 2023-02-25 DIAGNOSIS — L03116 Cellulitis of left lower limb: Secondary | ICD-10-CM | POA: Diagnosis not present

## 2023-02-25 DIAGNOSIS — Q828 Other specified congenital malformations of skin: Secondary | ICD-10-CM | POA: Diagnosis not present

## 2023-02-25 DIAGNOSIS — M25471 Effusion, right ankle: Secondary | ICD-10-CM

## 2023-02-25 NOTE — Progress Notes (Signed)
Subjective:  Patient ID: Connie Coffey, female    DOB: [birth year],  MRN: [id number]  Chief Complaint  Patient presents with   Follow-up    F/u left heel callus. Patient no longer has the callus.     67 y.o. female presents for follow-up on left heel callus.  Also following up on a prior episode of blistering on her left foot on the top of the foot as well as some redness and swelling.  She said she has been treated for the blister and cellulitis with antibiotics by urgent care/primary.  Says it is improved.  Unclear what caused it as there is no open wound that she could find.  Past Medical History:  Diagnosis Date   Adrenal insufficiency (HCC)    Anxiety    Arthritis    Carpal tunnel syndrome    Both hands, worse in right   Chronic constipation    COPD (chronic obstructive pulmonary disease) (HCC)    CVA (cerebral vascular accident) (HCC)    Depression    Facet syndrome    Fibromyalgia    GERD (gastroesophageal reflux disease)    Heart murmur    Hypercholesteremia    IBS (irritable bowel syndrome)    Ischemic stroke (HCC)    07/19/2004   Lumbar radiculopathy    Osteoporosis    Radiculopathy, lumbar region 05/02/2021   RLS (restless legs syndrome)    Spinal stenosis    TMJ (temporomandibular joint disorder)    Trochanteric bursitis of both hips    Vascular malformation    spinal cord    Allergies  Allergen Reactions   Beta Vulgaris Anaphylaxis    Beets    Penicillins Shortness Of Breath   Celecoxib Other (See Comments)    "because of stomach ulcers"   Chantix [Varenicline]     Makes pt sick on her stomach    Dog Epithelium (Canis Lupus Familiaris)     Cats and dogs   Fish Oil Other (See Comments)    hemorrhoids   Gramineae Pollens    Molds & Smuts    Nsaids Other (See Comments)    GI Upset. Ulcers   Other Other (See Comments)    beets  Including Celebrex   Prednisone Other (See Comments)    High dose causes elevated blood sugar   Tape Dermatitis     Electrodes caused blistering   Nicotine Nausea Only and Other (See Comments)    ROS: Negative except as per HPI above  Objective:  General: AAO x3, NAD  Dermatological: No further hyperkeratotic lesion on the left plantar heel much improved from prior.  No pain.  Mild erythema of the left dorsal forefoot however much improved from prior based on pictures.  No open wound is present.  Skin is healing with some peeling skin on the margins  Vascular: DP and PT pulses are diminished..   Neruologic: Grossly intact via light touch bilateral. Protective threshold intact to all sites bilateral.   Musculoskeletal: No gross boney pedal deformities bilateral. No pain, crepitus, or limitation noted with foot and ankle range of motion bilateral. Muscular strength 5/5 in all groups tested bilateral.  Gait: Unassisted, Nonantalgic.   No images are attached to the encounter.   Assessment:   1. Porokeratosis   2. PAD (peripheral artery disease) (HCC)   3. Edema of right ankle   4. Cellulitis of left lower extremity       Plan:  Patient was evaluated and treated and all questions answered.  #  Edema of bilateral lower extremity -Continue to recommend compression stockings  # Painful hyperkeratotic lesion representing porokeratosis left plantar heel -No debridement required at this time as the lesion has not grown back  # Cellulitis of left lower extremity -Much improved from prior after antibiotic therapy per primary/urgent care -No evidence of open wound that would have caused it no wound care needed at this time -Continue to monitor for recurrence   No follow-ups on file.          Corinna Gab, DPM [facility]

## 2023-02-28 ENCOUNTER — Ambulatory Visit: Payer: Self-pay

## 2023-02-28 NOTE — Patient Outreach (Signed)
Care Coordination   02/28/2023 Name: Connie Coffey MRN: [id number] DOB: 1956-05-05   Care Coordination Outreach Attempts:  An unsuccessful telephone outreach was attempted for a scheduled appointment today.  Follow Up Plan:  Additional outreach attempts will be made to offer the patient care coordination information and services.   Encounter Outcome:  No Answer   Care Coordination Interventions:  No, not indicated    Lonia Chimera, RN, BSN, CEN [facility] [phone]

## 2023-03-13 ENCOUNTER — Ambulatory Visit: Payer: Self-pay

## 2023-03-13 NOTE — Patient Outreach (Signed)
Care Coordination   Follow Up Visit Note   03/13/2023 Name: Connie Coffey MRN: [id number] DOB: June 20, 1955  Connie Coffey is a 67 y.o. year old female who sees Cox, Kirsten, MD for primary care. I spoke with  Connie Coffey by phone today.  What matters to the patients health and wellness today?  Reports decrease swelling and redness in feet and legs. Reports wearing the compression hose. Reports left foot skin is peeling.  Patient reports that she is managing her pain well. Knows when to   Goals Addressed               This Visit's Progress     COMPLETED: Chronic back pain and lack of energy (pt-stated)        Interventions Today    Flowsheet Row Most Recent Value  Chronic Disease   Chronic disease during today's visit Other  [back pain]  General Interventions   General Interventions Discussed/Reviewed General Interventions Discussed  Exercise Interventions   Exercise Discussed/Reviewed Physical Activity  Pharmacy Interventions   Pharmacy Dicussed/Reviewed Medications and their functions  Safety Interventions   Safety Discussed/Reviewed Fall Risk      Reviewed pain and fall risk. Patient denies any other problems or concerns.         SDOH assessments and interventions completed:  No     Care Coordination Interventions:  Yes, provided   Follow up plan: No further intervention required.   Encounter Outcome:  Patient Visit Completed   Lonia Chimera, RN, BSN, CEN [facility] Coordinator [phone]

## 2023-03-26 ENCOUNTER — Other Ambulatory Visit: Payer: Self-pay | Admitting: Family Medicine

## 2023-03-26 DIAGNOSIS — R600 Localized edema: Secondary | ICD-10-CM

## 2023-03-28 ENCOUNTER — Encounter: Payer: Self-pay | Admitting: Physician Assistant

## 2023-03-28 ENCOUNTER — Ambulatory Visit: Payer: 59 | Admitting: Physician Assistant

## 2023-03-28 VITALS — BP 122/74 | HR 99 | Temp 97.1°F | Ht 60.0 in | Wt 146.0 lb

## 2023-03-28 DIAGNOSIS — G894 Chronic pain syndrome: Secondary | ICD-10-CM | POA: Diagnosis not present

## 2023-03-28 DIAGNOSIS — J441 Chronic obstructive pulmonary disease with (acute) exacerbation: Secondary | ICD-10-CM

## 2023-03-28 MED ORDER — SALONPAS PAIN RELIEF PATCH 3-10 % EX PTCH
1.0000 | MEDICATED_PATCH | Freq: Every day | CUTANEOUS | 2 refills | Status: DC | PRN
Start: 2023-03-28 — End: 2023-05-17

## 2023-03-28 MED ORDER — PREDNISONE 20 MG PO TABS
ORAL_TABLET | ORAL | 0 refills | Status: AC
Start: 2023-03-28 — End: 2023-04-05

## 2023-03-28 MED ORDER — PROMETHAZINE-DM 6.25-15 MG/5ML PO SYRP
5.0000 mL | ORAL_SOLUTION | Freq: Four times a day (QID) | ORAL | 0 refills | Status: DC | PRN
Start: 2023-03-28 — End: 2023-05-17

## 2023-03-28 NOTE — Progress Notes (Signed)
Acute Office Visit  Subjective:    Patient ID: Connie Coffey, female    DOB: 08-21-1955, 67 y.o.   MRN: [id number]  Chief Complaint  Patient presents with   Dry Cough    HPI: Patient is in today for dry cough. Patient states it has been going on now for over a week and its worse at night than it is during the day. Patient denies any congestion or drainage but states she is coughing up phlegm. She describes it as clear at first but now it is more white with a slight yellow. Denies any fever, chills, night sweats. Stated that the prednisone taper has helped her before.   She has also been having worsening back pain. She currently has a neuro stimulator and does get injections into her back to help. She had to do some yard work and move around a few heavy items at her house recently and it has caused her to have some severe pain. Stated she had these patches before and it helped for break through pain. Wanted to try them again and see if they could help for these acute pains.   Past Medical History:  Diagnosis Date   Adrenal insufficiency (HCC)    Anxiety    Arthritis    Carpal tunnel syndrome    Both hands, worse in right   Chronic constipation    COPD (chronic obstructive pulmonary disease) (HCC)    CVA (cerebral vascular accident) (HCC)    Depression    Facet syndrome    Fibromyalgia    GERD (gastroesophageal reflux disease)    Heart murmur    Hypercholesteremia    IBS (irritable bowel syndrome)    Ischemic stroke (HCC)    07/19/2004   Lumbar radiculopathy    Osteoporosis    Radiculopathy, lumbar region 05/02/2021   RLS (restless legs syndrome)    Spinal stenosis    TMJ (temporomandibular joint disorder)    Trochanteric bursitis of both hips    Vascular malformation    spinal cord    Past Surgical History:  Procedure Laterality Date   APPENDECTOMY     CARPAL TUNNEL RELEASE Right 12/13/2017   surgery   CATARACT EXTRACTION Bilateral    CHOLECYSTECTOMY      COLONOSCOPY  05/06/2017   Colonic polyps status post polypectomy. Interan land external hemorrhoids.    ESOPHAGOGASTRODUODENOSCOPY  04/28/2007   Mild gastrtiis. Otherwise normal EGD.    HEMORROIDECTOMY     LAMINECTOMY AND MICRODISCECTOMY LUMBAR SPINE  01/03/2005   NECK SURGERY     x2    OTHER SURGICAL HISTORY  04/24/2004   anterior cervical discectomy and fusion at C5-C6   SPINAL CORD STIMULATOR INSERTION N/A 07/21/2021   Procedure: Permanant Spinal cord stimulator placement leads and battery under fluroscopy;  Surgeon: Renaldo Fiddler, MD;  Location: [facility] OR;  Service: Neurosurgery;  Laterality: N/A;   TONSILLECTOMY     TOTAL ABDOMINAL HYSTERECTOMY      Family History  Problem Relation Age of Onset   Supraventricular tachycardia Mother    Dementia Mother    Hyperlipidemia Mother    Hypertension Mother    Osteoarthritis Mother    Osteoporosis Mother    Breast cancer Other    Breast cancer Other    Colon cancer Neg Hx    Esophageal cancer Neg Hx     Social History   Socioeconomic History   Marital status: Divorced    Spouse name: Not on file   Number of  children: 1   Years of education: Not on file   Highest education level: Not on file  Occupational History   Occupation: Disabled  Tobacco Use   Smoking status: Every Day    Current packs/day: 0.50    Types: Cigarettes   Smokeless tobacco: Never  Vaping Use   Vaping status: Never Used  Substance and Sexual Activity   Alcohol use: Not Currently    Comment: quit 19 years   Drug use: Never   Sexual activity: Not Currently  Other Topics Concern   Not on file  Social History Narrative   Disabled son lives with her sometimes and his father sometimes.  Connie Coffey is working hard to have a relationship with her son's father and his wife.  They help her when needed.   Social Determinants of Health   Financial Resource Strain: Low Risk  (11/15/2021)   Overall Financial Resource Strain (CARDIA)    Difficulty of Paying Living  Expenses: Not hard at all  Food Insecurity: No Food Insecurity (07/18/2022)   Hunger Vital Sign    Worried About Running Out of Food in the Last Year: Never true    Ran Out of Food in the Last Year: Never true  Transportation Needs: No Transportation Needs (07/18/2022)   PRAPARE - Administrator, Civil Service (Medical): No    Lack of Transportation (Non-Medical): No  Physical Activity: Inactive (01/14/2023)   Exercise Vital Sign    Days of Exercise per Week: 0 days    Minutes of Exercise per Session: 0 min  Stress: No Stress Concern Present (07/18/2022)   [employer] of Occupational Health - Occupational Stress Questionnaire    Feeling of Stress : Only a little  Social Connections: Socially Isolated (01/14/2023)   Social Connection and Isolation Panel [NHANES]    Frequency of Communication with Friends and Family: Three times a week    Frequency of Social Gatherings with Friends and Family: Three times a week    Attends Religious Services: Never    Active Member of Clubs or Organizations: No    Attends Banker Meetings: Never    Marital Status: Divorced  Catering manager Violence: Not At Risk (07/18/2022)   Humiliation, Afraid, Rape, and Kick questionnaire    Fear of Current or Ex-Partner: No    Emotionally Abused: No    Physically Abused: No    Sexually Abused: No    Outpatient Medications Prior to Visit  Medication Sig Dispense Refill   Abaloparatide (TYMLOS) 3120 MCG/1.56ML SOPN Inject 80 mcg into the skin daily. 4.68 mL 1   albuterol (VENTOLIN HFA) 108 (90 Base) MCG/ACT inhaler Inhale 2 puffs into the lungs every 4 (four) hours as needed. 54 g 3   ARIPiprazole (ABILIFY) 5 MG tablet TAKE 1 TABLET (5 MG TOTAL) BY MOUTH DAILY. 90 tablet 1   atorvastatin (LIPITOR) 40 MG tablet TAKE 1 TABLET BY MOUTH DAILY 100 tablet 2   Azelastine HCl 137 MCG/SPRAY SOLN PLACE 2 SPRAYS INTO THE NOSE IN THE MORNING AND AT BEDTIME. 30 mL 1   bethanechol (URECHOLINE) 25 MG  tablet Take 1 tablet (25 mg total) by mouth in the morning and at bedtime. 180 tablet 1   busPIRone (BUSPAR) 30 MG tablet TAKE 1 TABLET BY MOUTH TWICE A DAY 180 tablet 1   cetirizine (ZYRTEC) 10 MG tablet TAKE 1 TABLET BY MOUTH EVERYDAY AT BEDTIME 90 tablet 2   ciclopirox (PENLAC) 8 % solution Apply topically at bedtime. Apply over  nail and surrounding skin. Apply daily over previous coat. After seven (7) days, may remove with alcohol and continue cycle. 6.6 mL 0   cyclobenzaprine (FLEXERIL) 10 MG tablet TAKE 1 TABLET BY MOUTH 3  TIMES DAILY 270 tablet 1   DULoxetine (CYMBALTA) 60 MG capsule TAKE 1 CAPSULE BY MOUTH  DAILY 90 capsule 3   fenofibrate 160 MG tablet TAKE 1 TABLET BY MOUTH EVERY DAY 90 tablet 0   fluticasone (FLONASE) 50 MCG/ACT nasal spray SPRAY 2 SPRAYS INTO EACH NOSTRIL EVERY DAY 48 mL 3   gabapentin (NEURONTIN) 600 MG tablet TAKE 1 TABLET BY MOUTH 4 TIMES  DAILY 400 tablet 2   hydrocortisone (CORTEF) 10 MG tablet Take 10 mg by mouth 2 (two) times daily.     hydrOXYzine (VISTARIL) 25 MG capsule TAKE 1 CAPSULE (25 MG TOTAL) BY MOUTH EVERY 8 (EIGHT) HOURS AS NEEDED FOR ANXIETY OR ITCHING. 270 capsule 0   Insulin Pen Needle (GLOBAL EASY GLIDE PEN NEEDLES) 32G X 4 MM MISC Use one needle daily as directed     levocetirizine (XYZAL) 5 MG tablet TAKE 1 TABLET BY MOUTH EVERY DAY IN THE EVENING 90 tablet 3   levothyroxine (SYNTHROID) 75 MCG tablet TAKE 1 TABLET BY MOUTH EVERY DAY BEFORE BREAKFAST 90 tablet 2   lubiprostone (AMITIZA) 8 MCG capsule TAKE 1 CAPSULE BY MOUTH TWICE  DAILY WITH MEALS 120 capsule 5   montelukast (SINGULAIR) 10 MG tablet TAKE 1 TABLET BY MOUTH DAILY 100 tablet 2   Multiple Vitamin (MULTI-VITAMINS) TABS Take 1 tablet by mouth daily.     nystatin (MYCOSTATIN) 100000 UNIT/ML suspension Take 5 mLs (500,000 Units total) by mouth 4 (four) times daily. 60 mL 2   olopatadine (PATANOL) 0.1 % ophthalmic solution Place 1 drop into both eyes daily as needed for allergies.      oxyCODONE-acetaminophen (PERCOCET) 10-325 MG tablet TAKE 1 TAB EVERY 4 HOURS FOR 5 DAYS THEN 1 TAB TWICE A DAY AS NEEDED FOR SEVERE BREAKTHROUGH PAIN  0   pantoprazole (PROTONIX) 40 MG tablet TAKE 1 TABLET BY MOUTH TWICE A DAY 180 tablet 1   primidone (MYSOLINE) 50 MG tablet TAKE 1 TABLET BY MOUTH TWICE A DAY 180 tablet 1   Propylene Glycol (SYSTANE BALANCE) 0.6 % SOLN Place 1 drop into both eyes daily as needed (dry eyes).     Tiotropium Bromide Monohydrate (SPIRIVA RESPIMAT) 2.5 MCG/ACT AERS USE 1 INHALATION BY MOUTH  TWICE DAILY 12 g 3   torsemide (DEMADEX) 10 MG tablet TAKE 1 TABLET BY MOUTH EVERY DAY 90 tablet 1   urea (CARMOL) 10 % cream APPLY TO AFFECTED AREA EVERY DAY AS NEEDED 85 g 1   Vitamin D, Ergocalciferol, (DRISDOL) 50000 units CAPS capsule Take 50,000 Units by mouth every Wednesday.     promethazine-dextromethorphan (PROMETHAZINE-DM) 6.25-15 MG/5ML syrup Take 5 mLs by mouth 4 (four) times daily as needed for cough. 180 mL 0   No facility-administered medications prior to visit.    Allergies  Allergen Reactions   Beta Vulgaris Anaphylaxis    Beets    Penicillins Shortness Of Breath   Celecoxib Other (See Comments)    "because of stomach ulcers"   Chantix [Varenicline]     Makes pt sick on her stomach    Dog Epithelium (Canis Lupus Familiaris)     Cats and dogs   Fish Oil Other (See Comments)    hemorrhoids   Gramineae Pollens    Molds & Smuts    Nsaids Other (  See Comments)    GI Upset. Ulcers   Other Other (See Comments)    beets  Including Celebrex   Tape Dermatitis    Electrodes caused blistering   Nicotine Nausea Only and Other (See Comments)    Review of Systems  Constitutional:  Negative for appetite change, fatigue and fever.  HENT:  Negative for congestion, ear pain, sinus pressure and sore throat.   Respiratory:  Positive for cough (Dry) and wheezing. Negative for chest tightness and shortness of breath.   Cardiovascular:  Negative for chest pain and  palpitations.  Gastrointestinal:  Negative for abdominal pain, constipation, diarrhea, nausea and vomiting.  Genitourinary:  Negative for dysuria and hematuria.  Musculoskeletal:  Negative for arthralgias, back pain, joint swelling and myalgias.  Skin:  Negative for rash.  Neurological:  Negative for dizziness, weakness and headaches.  Psychiatric/Behavioral:  Negative for dysphoric mood. The patient is not nervous/anxious.        Objective:        03/28/2023    2:02 PM 02/11/2023    3:01 PM 01/14/2023   11:24 AM  Vitals with BMI  Height 5\' 0"  5\' 0"  5' 1.5"  Weight 146 lbs 143 lbs 141 lbs  BMI 28.51 27.93 26.21  Systolic 122 124 956  Diastolic 74 70 70  Pulse 99 88 86    Orthostatic VS for the past 72 hrs (Last 3 readings):  Patient Position BP Location  03/28/23 1402 Sitting Left Arm     Physical Exam Vitals reviewed.  Constitutional:      Appearance: Normal appearance.  Cardiovascular:     Rate and Rhythm: Normal rate and regular rhythm.     Heart sounds: Normal heart sounds.  Pulmonary:     Effort: Pulmonary effort is normal. Prolonged expiration present.     Breath sounds: Examination of the right-upper field reveals wheezing. Examination of the left-upper field reveals wheezing. Examination of the right-middle field reveals wheezing. Examination of the left-middle field reveals wheezing. Examination of the right-lower field reveals wheezing. Examination of the left-lower field reveals wheezing. Wheezing present.  Abdominal:     General: Bowel sounds are normal.     Palpations: Abdomen is soft.     Tenderness: There is no abdominal tenderness.  Neurological:     Mental Status: She is alert and oriented to person, place, and time.  Psychiatric:        Mood and Affect: Mood normal.        Behavior: Behavior normal.     Health Maintenance Due  Topic Date Due   Zoster Vaccines- Shingrix (1 of 2) Never done   Lung Cancer Screening  Never done   INFLUENZA VACCINE   12/27/2022   COVID-19 Vaccine (5 - 2023-24 season) 01/27/2023    There are no preventive care reminders to display for this patient.   Lab Results  Component Value Date   TSH 0.688 10/08/2022   Lab Results  Component Value Date   WBC 11.6 (H) 10/08/2022   HGB 14.2 10/08/2022   HCT 43.1 10/08/2022   MCV 86 10/08/2022   PLT 492 (H) 10/08/2022   Lab Results  Component Value Date   NA 143 12/03/2022   K 4.7 12/03/2022   CO2 30 (H) 12/03/2022   GLUCOSE 77 12/03/2022   BUN 17 12/03/2022   CREATININE 0.88 12/03/2022   BILITOT <0.2 12/03/2022   ALKPHOS 53 12/03/2022   AST 28 12/03/2022   ALT 29 12/03/2022   PROT 7.0 12/03/2022  ALBUMIN 4.6 12/03/2022   CALCIUM 9.9 12/03/2022   ANIONGAP 8 07/21/2021   EGFR 72 12/03/2022   Lab Results  Component Value Date   CHOL 189 07/16/2022   Lab Results  Component Value Date   HDL 102 07/16/2022   Lab Results  Component Value Date   LDLCALC 70 07/16/2022   Lab Results  Component Value Date   TRIG 97 07/16/2022   Lab Results  Component Value Date   CHOLHDL 1.9 07/16/2022   No results found for: "HGBA1C"     Assessment & Plan:  COPD exacerbation (HCC) Assessment & Plan: Prescribed Prednisone taper Will call pulmonologist to schedule follow up appointment about uncontrolled COPD If symptoms change before she can see pulmonologist we will try to manage till they see her.   Orders: -     predniSONE; Take 3 tablets (60 mg total) by mouth daily with breakfast for 3 days, THEN 2 tablets (40 mg total) daily with breakfast for 3 days, THEN 1 tablet (20 mg total) daily with breakfast for 3 days.  Dispense: 18 tablet; Refill: 0 -     Promethazine-DM; Take 5 mLs by mouth 4 (four) times daily as needed for cough.  Dispense: 180 mL; Refill: 0  Chronic pain syndrome Assessment & Plan: Prescribed Salonpas patches Allergy to nsaids was due to a history of perforated ulcers. With the patch being topical we are not concerned for  causing another ulcer Will continue to monitor  Orders: -     Salonpas Pain Relief Patch; Apply 1 Application topically daily as needed.  Dispense: 20 patch; Refill: 2     Meds ordered this encounter  Medications   predniSONE (DELTASONE) 20 MG tablet    Sig: Take 3 tablets (60 mg total) by mouth daily with breakfast for 3 days, THEN 2 tablets (40 mg total) daily with breakfast for 3 days, THEN 1 tablet (20 mg total) daily with breakfast for 3 days.    Dispense:  18 tablet    Refill:  0   Menthol-Methyl Salicylate (SALONPAS PAIN RELIEF PATCH) 3-10 % PTCH    Sig: Apply 1 Application topically daily as needed.    Dispense:  20 patch    Refill:  2   promethazine-dextromethorphan (PROMETHAZINE-DM) 6.25-15 MG/5ML syrup    Sig: Take 5 mLs by mouth 4 (four) times daily as needed for cough.    Dispense:  180 mL    Refill:  0    No orders of the defined types were placed in this encounter.    Follow-up: No follow-ups on file.  An After Visit Summary was printed and given to the patient.   I,Lauren M Auman,acting as a Neurosurgeon for [employer], [employer].,have documented all relevant documentation on the behalf of Langley Gauss, PA,as directed by  Langley Gauss, PA while in the presence of Langley Gauss, [place].    Langley Gauss, [place] [facility] [phone]

## 2023-03-28 NOTE — Assessment & Plan Note (Signed)
Prescribed Salonpas patches Allergy to nsaids was due to a history of perforated ulcers. With the patch being topical we are not concerned for causing another ulcer Will continue to monitor

## 2023-03-28 NOTE — Assessment & Plan Note (Signed)
Prescribed Prednisone taper Will call pulmonologist to schedule follow up appointment about uncontrolled COPD If symptoms change before she can see pulmonologist we will try to manage till they see her.

## 2023-04-11 DIAGNOSIS — M810 Age-related osteoporosis without current pathological fracture: Secondary | ICD-10-CM | POA: Diagnosis not present

## 2023-04-11 LAB — HM DEXA SCAN

## 2023-04-16 DIAGNOSIS — Z981 Arthrodesis status: Secondary | ICD-10-CM | POA: Diagnosis not present

## 2023-04-16 DIAGNOSIS — Z9689 Presence of other specified functional implants: Secondary | ICD-10-CM | POA: Diagnosis not present

## 2023-04-16 DIAGNOSIS — M961 Postlaminectomy syndrome, not elsewhere classified: Secondary | ICD-10-CM | POA: Diagnosis not present

## 2023-04-16 DIAGNOSIS — M5414 Radiculopathy, thoracic region: Secondary | ICD-10-CM | POA: Diagnosis not present

## 2023-04-16 DIAGNOSIS — M5134 Other intervertebral disc degeneration, thoracic region: Secondary | ICD-10-CM | POA: Diagnosis not present

## 2023-04-18 ENCOUNTER — Other Ambulatory Visit: Payer: Self-pay | Admitting: Family Medicine

## 2023-05-02 DIAGNOSIS — Z8639 Personal history of other endocrine, nutritional and metabolic disease: Secondary | ICD-10-CM | POA: Diagnosis not present

## 2023-05-02 DIAGNOSIS — E2740 Unspecified adrenocortical insufficiency: Secondary | ICD-10-CM | POA: Diagnosis not present

## 2023-05-02 DIAGNOSIS — E559 Vitamin D deficiency, unspecified: Secondary | ICD-10-CM | POA: Diagnosis not present

## 2023-05-02 DIAGNOSIS — M810 Age-related osteoporosis without current pathological fracture: Secondary | ICD-10-CM | POA: Diagnosis not present

## 2023-05-02 DIAGNOSIS — E039 Hypothyroidism, unspecified: Secondary | ICD-10-CM | POA: Diagnosis not present

## 2023-05-06 DIAGNOSIS — M5414 Radiculopathy, thoracic region: Secondary | ICD-10-CM | POA: Diagnosis not present

## 2023-05-10 ENCOUNTER — Telehealth: Payer: Self-pay

## 2023-05-10 ENCOUNTER — Other Ambulatory Visit: Payer: Self-pay

## 2023-05-10 DIAGNOSIS — M810 Age-related osteoporosis without current pathological fracture: Secondary | ICD-10-CM

## 2023-05-10 NOTE — Telephone Encounter (Signed)
Copied from CRM [id number]. Topic: General - Other >> May 10, 2023  8:42 AM Sasha H wrote: Reason for CRM: PT is still waiting to hear back about bone density test results

## 2023-05-10 NOTE — Telephone Encounter (Signed)
Patient made aware we have not got results and will call her when we get results.

## 2023-05-14 ENCOUNTER — Encounter: Payer: 59 | Admitting: Family Medicine

## 2023-05-14 ENCOUNTER — Ambulatory Visit (INDEPENDENT_AMBULATORY_CARE_PROVIDER_SITE_OTHER): Payer: 59

## 2023-05-14 VITALS — Ht 60.0 in | Wt 140.0 lb

## 2023-05-14 DIAGNOSIS — F17200 Nicotine dependence, unspecified, uncomplicated: Secondary | ICD-10-CM

## 2023-05-14 DIAGNOSIS — Z0000 Encounter for general adult medical examination without abnormal findings: Secondary | ICD-10-CM

## 2023-05-14 DIAGNOSIS — H9193 Unspecified hearing loss, bilateral: Secondary | ICD-10-CM

## 2023-05-14 NOTE — Progress Notes (Signed)
 Because this visit was a virtual/telehealth visit,  certain criteria was not obtained, such a blood pressure, CBG if applicable, and timed get up and go. Any medications not marked as "taking" were not mentioned during the medication reconciliation part of the visit. Any vitals not documented were not able to be obtained due to this being a telehealth visit or patient was unable to self-report a recent blood pressure reading due to a lack of equipment at home via telehealth. Vitals that have been documented are verbally provided by the patient.   Subjective:   Connie Coffey is a 67 y.o. female who presents for Medicare Annual (Subsequent) preventive examination.  Visit Complete: Virtual I connected with  Connie Coffey on 05/14/23 by a audio enabled telemedicine application and verified that I am speaking with the correct person using two identifiers.  Patient Location: Home  Provider Location: Home Office  I discussed the limitations of evaluation and management by telemedicine. The patient expressed understanding and agreed to proceed.  Vital Signs: Because this visit was a virtual/telehealth visit, some criteria may be missing or patient reported. Any vitals not documented were not able to be obtained and vitals that have been documented are patient reported.  Patient Medicare AWV questionnaire was completed by the patient on na; I have confirmed that all information answered by patient is correct and no changes since this date.  Cardiac Risk Factors include: advanced age (>49men, >55 women);dyslipidemia;hypertension;sedentary lifestyle;smoking/ tobacco exposure;Other (see comment), Risk factor comments: COPD     Objective:    Today's Vitals   05/14/23 0905 05/14/23 0906  Weight: 140 lb (63.5 kg)   Height: 5' (1.524 m)   PainSc:  5    Body mass index is 27.34 kg/m.     05/14/2023    9:04 AM 07/18/2022    2:12 PM 11/03/2021    1:42 PM 07/19/2021    2:05 PM 04/25/2021    2:11 PM  02/06/2021   10:42 AM 10/21/2020    1:43 PM  Advanced Directives  Does Patient Have a Medical Advance Directive? Yes No Yes Yes Yes Yes Yes  Type of Estate agent of [place];Living will   Healthcare Power of [employer] of [place];Living will   Does patient want to make changes to medical advance directive? No - Patient declined   No - Patient declined  No - Patient declined   Copy of Healthcare Power of Attorney in Chart? Yes - validated most recent copy scanned in chart (See row information)   No - copy requested  Yes - validated most recent copy scanned in chart (See row information)   Would patient like information on creating a medical advance directive?  No - Patient declined         Current Medications (verified) Outpatient Encounter Medications as of 05/14/2023  Medication Sig   Abaloparatide (TYMLOS) 3120 MCG/1.56ML SOPN Inject 80 mcg into the skin daily.   albuterol (VENTOLIN HFA) 108 (90 Base) MCG/ACT inhaler Inhale 2 puffs into the lungs every 4 (four) hours as needed.   ARIPiprazole (ABILIFY) 5 MG tablet TAKE 1 TABLET (5 MG TOTAL) BY MOUTH DAILY.   atorvastatin (LIPITOR) 40 MG tablet TAKE 1 TABLET BY MOUTH DAILY   Azelastine HCl 137 MCG/SPRAY SOLN PLACE 2 SPRAYS INTO THE NOSE IN THE MORNING AND AT BEDTIME.   bethanechol (URECHOLINE) 25 MG tablet Take 1 tablet (25 mg total) by mouth in the morning and at bedtime.   busPIRone (BUSPAR)  30 MG tablet TAKE 1 TABLET BY MOUTH TWICE A DAY   cetirizine (ZYRTEC) 10 MG tablet TAKE 1 TABLET BY MOUTH EVERYDAY AT BEDTIME   ciclopirox (PENLAC) 8 % solution Apply topically at bedtime. Apply over nail and surrounding skin. Apply daily over previous coat. After seven (7) days, may remove with alcohol and continue cycle.   cyclobenzaprine (FLEXERIL) 10 MG tablet TAKE 1 TABLET BY MOUTH 3  TIMES DAILY   DULoxetine (CYMBALTA) 60 MG capsule TAKE 1 CAPSULE BY MOUTH  DAILY   fenofibrate 160 MG tablet TAKE 1 TABLET BY  MOUTH EVERY DAY   fluticasone (FLONASE) 50 MCG/ACT nasal spray SPRAY 2 SPRAYS INTO EACH NOSTRIL EVERY DAY   gabapentin (NEURONTIN) 600 MG tablet TAKE 1 TABLET BY MOUTH 4 TIMES  DAILY   hydrocortisone (CORTEF) 10 MG tablet Take 10 mg by mouth 2 (two) times daily.   hydrOXYzine (VISTARIL) 25 MG capsule TAKE 1 CAPSULE (25 MG TOTAL) BY MOUTH EVERY 8 (EIGHT) HOURS AS NEEDED FOR ANXIETY OR ITCHING.   Insulin Pen Needle (GLOBAL EASY GLIDE PEN NEEDLES) 32G X 4 MM MISC Use one needle daily as directed   levocetirizine (XYZAL) 5 MG tablet TAKE 1 TABLET BY MOUTH EVERY DAY IN THE EVENING   levothyroxine (SYNTHROID) 75 MCG tablet TAKE 1 TABLET BY MOUTH EVERY DAY BEFORE BREAKFAST   lubiprostone (AMITIZA) 8 MCG capsule TAKE 1 CAPSULE BY MOUTH TWICE  DAILY WITH MEALS   Menthol-Methyl Salicylate (SALONPAS PAIN RELIEF PATCH) 3-10 % PTCH Apply 1 Application topically daily as needed.   montelukast (SINGULAIR) 10 MG tablet TAKE 1 TABLET BY MOUTH DAILY   Multiple Vitamin (MULTI-VITAMINS) TABS Take 1 tablet by mouth daily.   nystatin (MYCOSTATIN) 100000 UNIT/ML suspension Take 5 mLs (500,000 Units total) by mouth 4 (four) times daily.   olopatadine (PATANOL) 0.1 % ophthalmic solution Place 1 drop into both eyes daily as needed for allergies.   oxyCODONE-acetaminophen (PERCOCET) 10-325 MG tablet TAKE 1 TAB EVERY 4 HOURS FOR 5 DAYS THEN 1 TAB TWICE A DAY AS NEEDED FOR SEVERE BREAKTHROUGH PAIN   pantoprazole (PROTONIX) 40 MG tablet TAKE 1 TABLET BY MOUTH TWICE A DAY   primidone (MYSOLINE) 50 MG tablet TAKE 1 TABLET BY MOUTH TWICE A DAY   promethazine-dextromethorphan (PROMETHAZINE-DM) 6.25-15 MG/5ML syrup Take 5 mLs by mouth 4 (four) times daily as needed for cough.   Propylene Glycol (SYSTANE BALANCE) 0.6 % SOLN Place 1 drop into both eyes daily as needed (dry eyes).   Tiotropium Bromide Monohydrate (SPIRIVA RESPIMAT) 2.5 MCG/ACT AERS USE 1 INHALATION BY MOUTH  TWICE DAILY   torsemide (DEMADEX) 10 MG tablet TAKE 1  TABLET BY MOUTH EVERY DAY   urea (CARMOL) 10 % cream APPLY TO AFFECTED AREA EVERY DAY AS NEEDED   Vitamin D, Ergocalciferol, (DRISDOL) 50000 units CAPS capsule Take 50,000 Units by mouth every Wednesday.   No facility-administered encounter medications on file as of 05/14/2023.    Allergies (verified) Beta vulgaris, Penicillins, Celecoxib, Chantix [varenicline], Dog epithelium (canis lupus familiaris), Fish oil, Gramineae pollens, Molds & smuts, Nsaids, Other, Tape, and Nicotine   History: Past Medical History:  Diagnosis Date   Adrenal insufficiency (HCC)    Anxiety    Arthritis    Carpal tunnel syndrome    Both hands, worse in right   Chronic constipation    COPD (chronic obstructive pulmonary disease) (HCC)    CVA (cerebral vascular accident) (HCC)    Depression    Facet syndrome    Fibromyalgia  GERD (gastroesophageal reflux disease)    Heart murmur    Hypercholesteremia    IBS (irritable bowel syndrome)    Ischemic stroke (HCC)    07/19/2004   Lumbar radiculopathy    Osteoporosis    Radiculopathy, lumbar region 05/02/2021   RLS (restless legs syndrome)    Spinal stenosis    TMJ (temporomandibular joint disorder)    Trochanteric bursitis of both hips    Vascular malformation    spinal cord   Past Surgical History:  Procedure Laterality Date   APPENDECTOMY     CARPAL TUNNEL RELEASE Right 12/13/2017   surgery   CATARACT EXTRACTION Bilateral    CHOLECYSTECTOMY     COLONOSCOPY  05/06/2017   Colonic polyps status post polypectomy. Interan land external hemorrhoids.    ESOPHAGOGASTRODUODENOSCOPY  04/28/2007   Mild gastrtiis. Otherwise normal EGD.    HEMORROIDECTOMY     LAMINECTOMY AND MICRODISCECTOMY LUMBAR SPINE  01/03/2005   NECK SURGERY     x2    OTHER SURGICAL HISTORY  04/24/2004   anterior cervical discectomy and fusion at C5-C6   SPINAL CORD STIMULATOR INSERTION N/A 07/21/2021   Procedure: Permanant Spinal cord stimulator placement leads and battery  under fluroscopy;  Surgeon: Renaldo Fiddler, MD;  Location: [facility] OR;  Service: Neurosurgery;  Laterality: N/A;   TONSILLECTOMY     TOTAL ABDOMINAL HYSTERECTOMY     Family History  Problem Relation Age of Onset   Supraventricular tachycardia Mother    Dementia Mother    Hyperlipidemia Mother    Hypertension Mother    Osteoarthritis Mother    Osteoporosis Mother    Breast cancer Other    Breast cancer Other    Colon cancer Neg Hx    Esophageal cancer Neg Hx    Social History   Socioeconomic History   Marital status: Divorced    Spouse name: Not on file   Number of children: 1   Years of education: Not on file   Highest education level: Not on file  Occupational History   Occupation: Disabled  Tobacco Use   Smoking status: Every Day    Current packs/day: 0.50    Types: Cigarettes   Smokeless tobacco: Never  Vaping Use   Vaping status: Never Used  Substance and Sexual Activity   Alcohol use: Not Currently    Comment: quit 19 years   Drug use: Never   Sexual activity: Not Currently  Other Topics Concern   Not on file  Social History Narrative   Disabled son lives with her sometimes and his father sometimes.  Connie Coffey is working hard to have a relationship with her son's father and his wife.  They help her when needed.   Social Drivers of Corporate investment banker Strain: Low Risk  (05/14/2023)   Overall Financial Resource Strain (CARDIA)    Difficulty of Paying Living Expenses: Not hard at all  Food Insecurity: No Food Insecurity (05/14/2023)   Hunger Vital Sign    Worried About Running Out of Food in the Last Year: Never true    Ran Out of Food in the Last Year: Never true  Transportation Needs: No Transportation Needs (05/14/2023)   PRAPARE - Administrator, Civil Service (Medical): No    Lack of Transportation (Non-Medical): No  Physical Activity: Patient Declined (05/14/2023)   Exercise Vital Sign    Days of Exercise per Week: Patient declined     Minutes of Exercise per Session: Patient declined  Stress: Stress  Concern Present (05/14/2023)   [employer] of Occupational Health - Occupational Stress Questionnaire    Feeling of Stress : Rather much  Social Connections: Socially Isolated (05/14/2023)   Social Connection and Isolation Panel [NHANES]    Frequency of Communication with Friends and Family: More than three times a week    Frequency of Social Gatherings with Friends and Family: More than three times a week    Attends Religious Services: Never    Database administrator or Organizations: No    Attends Engineer, structural: Never    Marital Status: Divorced    Tobacco Counseling Ready to quit: Not Answered Counseling given: Not Answered   Clinical Intake:  Pre-visit preparation completed: Yes  Pain : 0-10 Pain Score: 5  Pain Type: Chronic pain Pain Location: Back Pain Orientation: Lower Pain Descriptors / Indicators: Aching, Constant Pain Onset: More than a month ago Pain Frequency: Constant     BMI - recorded: 27.34 Nutritional Status: BMI 25 -29 Overweight Nutritional Risks: None Diabetes: No  How often do you need to have someone help you when you read instructions, pamphlets, or other written materials from your doctor or pharmacy?: 1 - Never  Interpreter Needed?: No  Information entered by ::  Kenlei Safi, CMA   Activities of Daily Living    05/14/2023    9:24 AM 07/18/2022    2:11 PM  In your present state of health, do you have any difficulty performing the following activities:  Hearing? 1 1  Comment referral placed. Left Ear  Vision? 0 0  Difficulty concentrating or making decisions? 0 0  Walking or climbing stairs? 1 0  Dressing or bathing? 0 0  Doing errands, shopping? 0 0  Preparing Food and eating ? Y N  Using the Toilet? N N  In the past six months, have you accidently leaked urine? N N  Do you have problems with loss of bowel control? N N  Managing your  Medications? N N  Managing your Finances? N N  Housekeeping or managing your Housekeeping? [place]    Patient Care Team: Blane Ohara, MD as PCP - General (Family Medicine) Revankar, Aundra Dubin, MD as PCP - Cardiology (Cardiology) Lynann Bologna, MD as PCP - Gastroenterology (Gastroenterology) Arman Bogus, MD as Consulting Physician (Neurosurgery) Asencion Islam, DPM as Consulting Physician (Podiatry) Debroah Baller, MD as Consulting Physician (Urology) Weston Settle, MD as Consulting Physician (Oncology) Zettie Pho, [facility] (Inactive) (Pharmacist)  Indicate any recent Medical Services you may have received from other than [facility] providers in the past year (date may be approximate).     Assessment:   This is a routine wellness examination for [place].  Hearing/Vision screen Hearing Screening - Comments:: Patient complains of hearing difficulty. Referral placed today  Vision Screening - Comments:: Wears rx glasses - up to date with routine eye exams  [facility]   Goals Addressed             This Visit's Progress    Patient Stated       I'd like to cut back on some of the prescriptions I take.        Depression Screen    05/14/2023    9:10 AM 02/11/2023    3:07 PM 08/22/2022   11:41 AM 07/18/2022    2:09 PM 07/16/2022   10:59 AM 03/19/2022    9:57 AM 03/19/2022    9:42 AM  PHQ 2/9 Scores  PHQ - 2 Score  3 0 0 0 0 2 2  PHQ- 9 Score 13   0 9 8 11     Fall Risk    05/14/2023    9:23 AM 02/11/2023    3:06 PM 08/22/2022   11:41 AM 07/18/2022    2:10 PM 07/16/2022   10:58 AM  Fall Risk   Falls in the past year? 0 0 1 1 0  Number falls in past yr: 0 0 1 1 1   Injury with Fall? 0 0 0 0 0  Risk for fall due to : Impaired balance/gait;Impaired mobility Impaired mobility History of fall(s) History of fall(s) No Fall Risks  Follow up Falls prevention discussed;Education provided Falls prevention discussed;Falls evaluation completed Falls evaluation completed;Falls  prevention discussed  Falls evaluation completed    MEDICARE RISK AT HOME: Medicare Risk at Home Any stairs in or around the home?: No If so, are there any without handrails?: No Home free of loose throw rugs in walkways, pet beds, electrical cords, etc?: Yes Adequate lighting in your home to reduce risk of falls?: Yes Life alert?: No Use of a cane, walker or w/c?: Yes Grab bars in the bathroom?: No Shower chair or bench in shower?: Yes Elevated toilet seat or a handicapped toilet?: Yes  TIMED UP AND GO:  Was the test performed?  No    Cognitive Function:        05/14/2023    9:10 AM 07/18/2022    2:13 PM 02/06/2021   10:58 AM 06/30/2019    1:43 PM  6CIT Screen  What Year? 0 points 0 points 0 points 0 points  What month? 0 points 0 points 0 points 0 points  What time? 0 points 0 points 0 points 0 points  Count back from 20 0 points 0 points 0 points 0 points  Months in reverse 0 points 0 points 0 points 0 points  Repeat phrase 0 points 0 points 2 points 0 points  Total Score 0 points 0 points 2 points 0 points    Immunizations Immunization History  Administered Date(s) Administered   Fluad Quad(high Dose 65+) 02/01/2021, 03/19/2022   Influenza-Unspecified 01/26/2018, 02/06/2019, 02/05/2020   PFIZER(Purple Top)SARS-COV-2 Vaccination 01/30/2020, 04/28/2020   Pfizer Covid-19 Vaccine Bivalent Booster 49yrs & up 05/08/2021   Pfizer(Comirnaty)Fall Seasonal Vaccine 12 years and older 03/19/2022   Pneumococcal Conjugate-13 12/16/2014   Pneumococcal Polysaccharide-23 03/26/2013, 02/06/2021   Td 03/13/2015   Unspecified SARS-COV-2 Vaccination 03/19/2022    TDAP status: Up to date  Flu Vaccine status: Due, Education has been provided regarding the importance of this vaccine. Advised may receive this vaccine at local pharmacy or Health Dept. Aware to provide a copy of the vaccination record if obtained from local pharmacy or Health Dept. Verbalized acceptance and  understanding.  Pneumococcal vaccine status: Up to date  Covid-19 vaccine status: Information provided on how to obtain vaccines.   Qualifies for Shingles Vaccine? Yes   Zostavax completed No   Shingrix Completed?: No.    Education has been provided regarding the importance of this vaccine. Patient has been advised to call insurance company to determine out of pocket expense if they have not yet received this vaccine. Advised may also receive vaccine at local pharmacy or Health Dept. Verbalized acceptance and understanding.  Screening Tests Health Maintenance  Topic Date Due   Zoster Vaccines- Shingrix (1 of 2) Never done   Lung Cancer Screening  Never done   INFLUENZA VACCINE  12/27/2022   COVID-19 Vaccine (5 - 2024-25  season) 01/27/2023   Medicare Annual Wellness (AWV)  07/19/2023   MAMMOGRAM  07/31/2024   DTaP/Tdap/Td (2 - Tdap) 03/12/2025   Colonoscopy  06/03/2029   Pneumonia Vaccine 24+ Years old  Completed   DEXA SCAN  Completed   HPV VACCINES  Aged Out    Health Maintenance  Health Maintenance Due  Topic Date Due   Zoster Vaccines- Shingrix (1 of 2) Never done   Lung Cancer Screening  Never done   INFLUENZA VACCINE  12/27/2022   COVID-19 Vaccine (5 - 2024-25 season) 01/27/2023    Colorectal cancer screening: Type of screening: Colonoscopy. Completed 06/04/2019. Repeat every 10 years  Mammogram status: Completed 08/01/2022. Repeat every year  Bone Density Status: patient states she had this screening on April 11, 2023 at [facility]. They're behind getting them read.   Lung Cancer Screening: (Low Dose CT Chest recommended if Age 37-80 years, 20 pack-year currently smoking OR have quit w/in 15years.) does qualify.   Lung Cancer Screening Referral: 05/14/2023  Additional Screening:  Hepatitis C Screening: does not qualify;    Vision Screening: Recommended annual ophthalmology exams for early detection of glaucoma and other disorders of the eye. Is the  patient up to date with their annual eye exam?  Yes  Who is the provider or what is the name of the office in which the patient attends annual eye exams? [facility] If pt is not established with a provider, would they like to be referred to a provider to establish care? No .   Dental Screening: Recommended annual dental exams for proper oral hygiene  Diabetic Foot Exam: na  Community Resource Referral / Chronic Care Management: CRR required this visit?  No   CCM required this visit?  No     Plan:     I have personally reviewed and noted the following in the patient's chart:   Medical and social history Use of alcohol, tobacco or illicit drugs  Current medications and supplements including opioid prescriptions. Patient is currently taking opioid prescriptions. Information provided to patient regarding non-opioid alternatives. Patient advised to discuss non-opioid treatment plan with their provider. Functional ability and status Nutritional status Physical activity Advanced directives List of other physicians Hospitalizations, surgeries, and ER visits in previous 12 months Vitals Screenings to include cognitive, depression, and falls Referrals and appointments  In addition, I have reviewed and discussed with patient certain preventive protocols, quality metrics, and best practice recommendations. A written personalized care plan for preventive services as well as general preventive health recommendations were provided to patient.     Jordan Hawks Takeria Marquina, CMA   05/14/2023   After Visit Summary: (Mail) Due to this being a telephonic visit, the after visit summary with patients personalized plan was offered to patient via mail   Nurse Notes:

## 2023-05-14 NOTE — Patient Instructions (Signed)
Connie Coffey , Thank you for taking time to come for your Medicare Wellness Visit. I appreciate your ongoing commitment to your health goals. Please review the following plan we discussed and let me know if I can assist you in the future.   Referrals/Orders/Follow-Ups/Clinician Recommendations:  Next Medicare Annual Wellness Visit: May 14, 2024 at 8:00 am virtual visit  You are due for the vaccines checked below. You may have these done at your preferred pharmacy. Please have them fax the office proof of the vaccines so that we can update your chart.   [x]  Flu (due annually)  Recommended this fall either at PCP office or through your local pharmacy. The flu season starts August 1 of each year.   []  Shingrix (Shingles vaccine): CDC recommends 2 doses of Shingrix separated by 2-6 months for aged 7 years and older:  []  Pneumonia Vaccines: Recommended for adults 65 years or older  []  TDAP (Tetanus) Vaccine every 10 years:Recommended every 10 years; Please call your insurance company to determine your out of pocket expense. You also receive this vaccine at your local pharmacy or Health Dept.  [x]  Covid-19: Available now at any [facility] pharmacy (see info below)  You may also get your vaccines at any [facility] (locations listed below.) Vaccine hours are Monday - Friday 9:00 - 4:00. No appointments are required. Most insurances are accepted including Medicaid. Anyone can use the community pharmacies, and people are not required to have a [facility] provider.  Community Pharmacy Locations offering vaccines:   Sport and exercise psychologist   [facility] [place]  10 vaccines are offered at the [employer]: Covid, flu, Tdap, shingles, RSV, pneumonia, meningococcal, hepatitis A, hepatitis B, and HPV.    You've been referred to see Dr. Sinda Du for an eye exam. If you haven't heard from  his office in about a week, please call to schedule your appointment.   Sinda Du, MD [address] [place] [address]  Phone: [phone]   You have been referred to have a low dose lung cancer screening test. If you haven't heard from them in a week please call one of the numbers listed below.   Kandice Robinsons [address] [place] [address] [place] Phone: [phone] Ginette Otto Phone: [phone]   This is a list of the screening recommended for you and due dates:  Health Maintenance  Topic Date Due   Zoster (Shingles) Vaccine (1 of 2) Never done   Screening for Lung Cancer  Never done   Flu Shot  12/27/2022   COVID-19 Vaccine (5 - 2024-25 season) 01/27/2023   DEXA scan (bone density measurement)  04/08/2023   Mammogram  08/01/2023   Medicare Annual Wellness Visit  05/13/2024   DTaP/Tdap/Td vaccine (2 - Tdap) 03/12/2025   Colon Cancer Screening  06/03/2029   Pneumonia Vaccine  Completed   HPV Vaccine  Aged Out    Advanced directives: (In Chart) A copy of your advanced directives are scanned into your chart should your provider ever need it.  Next Medicare Annual Wellness Visit scheduled for next year: Yes  Preventive Care 90 Years and Older, Female Preventive care refers to lifestyle choices and visits with your health care provider that can promote health and wellness. Preventive care visits are also called wellness exams. What can I expect for my preventive care visit? Counseling Your health care provider may ask  you questions about your: Medical history, including: Past medical problems. Family medical history. Pregnancy and menstrual history. History of falls. Current health, including: Memory and ability to understand (cognition). Emotional well-being. Home life and relationship well-being. Sexual activity and sexual health. Lifestyle, including: Alcohol, nicotine or tobacco, and drug use. Access to firearms. Diet, exercise, and sleep  habits. Work and work Astronomer. Sunscreen use. Safety issues such as seatbelt and bike helmet use. Physical exam Your health care provider will check your: Height and weight. These may be used to calculate your BMI (body mass index). BMI is a measurement that tells if you are at a healthy weight. Waist circumference. This measures the distance around your waistline. This measurement also tells if you are at a healthy weight and may help predict your risk of certain diseases, such as type 2 diabetes and high blood pressure. Heart rate and blood pressure. Body temperature. Skin for abnormal spots. What immunizations do I need?  Vaccines are usually given at various ages, according to a schedule. Your health care provider will recommend vaccines for you based on your age, medical history, and lifestyle or other factors, such as travel or where you work. What tests do I need? Screening Your health care provider may recommend screening tests for certain conditions. This may include: Lipid and cholesterol levels. Hepatitis C test. Hepatitis B test. HIV (human immunodeficiency virus) test. STI (sexually transmitted infection) testing, if you are at risk. Lung cancer screening. Colorectal cancer screening. Diabetes screening. This is done by checking your blood sugar (glucose) after you have not eaten for a while (fasting). Mammogram. Talk with your health care provider about how often you should have regular mammograms. BRCA-related cancer screening. This may be done if you have a family history of breast, ovarian, tubal, or peritoneal cancers. Bone density scan. This is done to screen for osteoporosis. Talk with your health care provider about your test results, treatment options, and if necessary, the need for more tests. Follow these instructions at home: Eating and drinking  Eat a diet that includes fresh fruits and vegetables, whole grains, lean protein, and low-fat dairy products.  Limit your intake of foods with high amounts of sugar, saturated fats, and salt. Take vitamin and mineral supplements as recommended by your health care provider. Do not drink alcohol if your health care provider tells you not to drink. If you drink alcohol: Limit how much you have to 0-1 drink a day. Know how much alcohol is in your drink. In the [place] one drink equals one 12 oz bottle of beer (355 mL), one 5 oz glass of wine (148 mL), or one 1 oz glass of hard liquor (44 mL). Lifestyle Brush your teeth every morning and night with fluoride toothpaste. Floss one time each day. Exercise for at least 30 minutes 5 or more days each week. Do not use any products that contain nicotine or tobacco. These products include cigarettes, chewing tobacco, and vaping devices, such as e-cigarettes. If you need help quitting, ask your health care provider. Do not use drugs. If you are sexually active, practice safe sex. Use a condom or other form of protection in order to prevent STIs. Take aspirin only as told by your health care provider. Make sure that you understand how much to take and what form to take. Work with your health care provider to find out whether it is safe and beneficial for you to take aspirin daily. Ask your health care provider if you need to take  a cholesterol-lowering medicine (statin). Find healthy ways to manage stress, such as: Meditation, yoga, or listening to music. Journaling. Talking to a trusted person. Spending time with friends and family. Minimize exposure to UV radiation to reduce your risk of skin cancer. Safety Always wear your seat belt while driving or riding in a vehicle. Do not drive: If you have been drinking alcohol. Do not ride with someone who has been drinking. When you are tired or distracted. While texting. If you have been using any mind-altering substances or drugs. Wear a helmet and other protective equipment during sports activities. If you have  firearms in your house, make sure you follow all gun safety procedures. What's next? Visit your health care provider once a year for an annual wellness visit. Ask your health care provider how often you should have your eyes and teeth checked. Stay up to date on all vaccines. This information is not intended to replace advice given to you by your health care provider. Make sure you discuss any questions you have with your health care provider. Document Revised: 11/09/2020 Document Reviewed: 11/09/2020 Elsevier Patient Education  2024 [employer]. Understanding Your Risk for Falls Millions of people have serious injuries from falls each year. It is important to understand your risk of falling. Talk with your health care provider about your risk and what you can do to lower it. If you do have a serious fall, make sure to tell your provider. Falling once raises your risk of falling again. How can falls affect me? Serious injuries from falls are common. These include: Broken bones, such as hip fractures. Head injuries, such as traumatic brain injuries (TBI) or concussions. A fear of falling can cause you to avoid activities and stay at home. This can make your muscles weaker and raise your risk for a fall. What can increase my risk? There are a number of risk factors that increase your risk for falling. The more risk factors you have, the higher your risk of falling. Serious injuries from a fall happen most often to people who are older than 69 years old. Teenagers and young adults ages 50-29 are also at higher risk. Common risk factors include: Weakness in the lower body. Being generally weak or confused due to Fidler-term (chronic) illness. Dizziness or balance problems. Poor vision. Medicines that cause dizziness or drowsiness. These may include: Medicines for your blood pressure, heart, anxiety, insomnia, or swelling (edema). Pain medicines. Muscle relaxants. Other risk factors  include: Drinking alcohol. Having had a fall in the past. Having foot pain or wearing improper footwear. Working at a dangerous job. Having any of the following in your home: Tripping hazards, such as floor clutter or loose rugs. Poor lighting. Pets. Having dementia or memory loss. What actions can I take to lower my risk of falling?     Physical activity Stay physically fit. Do strength and balance exercises. Consider taking a regular class to build strength and balance. Yoga and tai chi are good options. Vision Have your eyes checked every year and your prescription for glasses or contacts updated as needed. Shoes and walking aids Wear non-skid shoes. Wear shoes that have rubber soles and low heels. Do not wear high heels. Do not walk around the house in socks or slippers. Use a cane or walker as told by your provider. Home safety Attach secure railings on both sides of your stairs. Install grab bars for your bathtub, shower, and toilet. Use a non-skid mat in your bathtub or  shower. Attach bath mats securely with double-sided, non-slip rug tape. Use good lighting in all rooms. Keep a flashlight near your bed. Make sure there is a clear path from your bed to the bathroom. Use night-lights. Do not use throw rugs. Make sure all carpeting is taped or tacked down securely. Remove all clutter from walkways and stairways, including extension cords. Repair uneven or broken steps and floors. Avoid walking on icy or slippery surfaces. Walk on the grass instead of on icy or slick sidewalks. Use ice melter to get rid of ice on walkways in the winter. Use a cordless phone. Questions to ask your health care provider Can you help me check my risk for a fall? Do any of my medicines make me more likely to fall? Should I take a vitamin D supplement? What exercises can I do to improve my strength and balance? Should I make an appointment to have my vision checked? Do I need a bone density test  to check for weak bones (osteoporosis)? Would it help to use a cane or a walker? Where to find more information Centers for Disease Control and Prevention, STEADI: TonerPromos.no Community-Based Fall Prevention Programs: TonerPromos.no [employer] on Aging: BaseRingTones.pl Contact a health care provider if: You fall at home. You are afraid of falling at home. You feel weak, drowsy, or dizzy. This information is not intended to replace advice given to you by your health care provider. Make sure you discuss any questions you have with your health care provider. Document Revised: 01/15/2022 Document Reviewed: 01/15/2022 Elsevier Patient Education  2024 Elsevier Inc. Steps to Quit Smoking Smoking tobacco is the leading cause of preventable death. It can affect almost every organ in the body. Smoking puts you and people around you at risk for many serious, Scibilia-lasting (chronic) diseases. Quitting smoking can be hard, but it is one of the best things that you can do for your health. It is never too late to quit. Do not give up if you cannot quit the first time. Some people need to try many times to quit. Do your best to stick to your quit plan, and talk with your doctor if you have any questions or concerns. How do I get ready to quit? Pick a date to quit. Set a date within the next 2 weeks to give you time to prepare. Write down the reasons why you are quitting. Keep this list in places where you will see it often. Tell your family, friends, and co-workers that you are quitting. Their support is important. Talk with your doctor about the choices that may help you quit. Find out if your health insurance will pay for these treatments. Know the people, places, things, and activities that make you want to smoke (triggers). Avoid them. What first steps can I take to quit smoking? Throw away all cigarettes at home, at work, and in your car. Throw away the things that you use when you smoke, such as ashtrays and  lighters. Clean your car. Empty the ashtray. Clean your home, including curtains and carpets. What can I do to help me quit smoking? Talk with your doctor about taking medicines and seeing a counselor. You are more likely to succeed when you do both. If you are pregnant or breastfeeding: Talk with your doctor about counseling or other ways to quit smoking. Do not take medicine to help you quit smoking unless your doctor tells you to. Quit right away Quit smoking completely, instead of slowly cutting back on  how much you smoke over a period of time. Stopping smoking right away may be more successful than slowly quitting. Go to counseling. In-person is best if this is an option. You are more likely to quit if you go to counseling sessions regularly. Take medicine You may take medicines to help you quit. Some medicines need a prescription, and some you can buy over-the-counter. Some medicines may contain a drug called nicotine to replace the nicotine in cigarettes. Medicines may: Help you stop having the desire to smoke (cravings). Help to stop the problems that come when you stop smoking (withdrawal symptoms). Your doctor may ask you to use: Nicotine patches, gum, or lozenges. Nicotine inhalers or sprays. Non-nicotine medicine that you take by mouth. Find resources Find resources and other ways to help you quit smoking and remain smoke-free after you quit. They include: Online chats with a Veterinary surgeon. Phone quitlines. Printed Materials engineer. Support groups or group counseling. Text messaging programs. Mobile phone apps. Use apps on your mobile phone or tablet that can help you stick to your quit plan. Examples of free services include Quit Guide from the CDC and smokefree.gov  What can I do to make it easier to quit?  Talk to your family and friends. Ask them to support and encourage you. Call a phone quitline, such as 1-800-QUIT-NOW, reach out to support groups, or work with a  Veterinary surgeon. Ask people who smoke to not smoke around you. Avoid places that make you want to smoke, such as: Bars. Parties. Smoke-break areas at work. Spend time with people who do not smoke. Lower the stress in your life. Stress can make you want to smoke. Try these things to lower stress: Getting regular exercise. Doing deep-breathing exercises. Doing yoga. Meditating. What benefits will I see if I quit smoking? Over time, you may have: A better sense of smell and taste. Less coughing and sore throat. A slower heart rate. Lower blood pressure. Clearer skin. Better breathing. Fewer sick days. Summary Quitting smoking can be hard, but it is one of the best things that you can do for your health. Do not give up if you cannot quit the first time. Some people need to try many times to quit. When you decide to quit smoking, make a plan to help you succeed. Quit smoking right away, not slowly over a period of time. When you start quitting, get help and support to keep you smoke-free. This information is not intended to replace advice given to you by your health care provider. Make sure you discuss any questions you have with your health care provider. Document Revised: 05/05/2021 Document Reviewed: 05/05/2021 Elsevier Patient Education  2024 [employer].

## 2023-05-16 DIAGNOSIS — J432 Centrilobular emphysema: Secondary | ICD-10-CM | POA: Diagnosis not present

## 2023-05-16 DIAGNOSIS — I700 Atherosclerosis of aorta: Secondary | ICD-10-CM | POA: Diagnosis not present

## 2023-05-16 DIAGNOSIS — R911 Solitary pulmonary nodule: Secondary | ICD-10-CM | POA: Diagnosis not present

## 2023-05-16 DIAGNOSIS — R918 Other nonspecific abnormal finding of lung field: Secondary | ICD-10-CM | POA: Diagnosis not present

## 2023-05-17 ENCOUNTER — Ambulatory Visit: Payer: 59

## 2023-05-17 VITALS — BP 110/60 | HR 91 | Temp 98.1°F | Resp 16 | Ht 60.0 in | Wt 144.0 lb

## 2023-05-17 DIAGNOSIS — F17200 Nicotine dependence, unspecified, uncomplicated: Secondary | ICD-10-CM | POA: Insufficient documentation

## 2023-05-17 DIAGNOSIS — E039 Hypothyroidism, unspecified: Secondary | ICD-10-CM | POA: Diagnosis not present

## 2023-05-17 DIAGNOSIS — F5101 Primary insomnia: Secondary | ICD-10-CM

## 2023-05-17 DIAGNOSIS — M5414 Radiculopathy, thoracic region: Secondary | ICD-10-CM | POA: Insufficient documentation

## 2023-05-17 DIAGNOSIS — J441 Chronic obstructive pulmonary disease with (acute) exacerbation: Secondary | ICD-10-CM | POA: Diagnosis not present

## 2023-05-17 DIAGNOSIS — E782 Mixed hyperlipidemia: Secondary | ICD-10-CM

## 2023-05-17 DIAGNOSIS — F17210 Nicotine dependence, cigarettes, uncomplicated: Secondary | ICD-10-CM | POA: Diagnosis not present

## 2023-05-17 MED ORDER — PROMETHAZINE-DM 6.25-15 MG/5ML PO SYRP: 5.0000 mL | ORAL_SOLUTION | Freq: Four times a day (QID) | ORAL | 0 refills | Status: DC | PRN

## 2023-05-17 NOTE — Progress Notes (Signed)
Subjective:  Patient ID: Connie Coffey, female    DOB: [birth year]  Age: 67 y.o. MRN: [id number]  Chief Complaint  Patient presents with   Medical Management of Chronic Issues   Insomnia    HPI   Discussed the use of AI scribe software for clinical note transcription with the patient, who gave verbal consent to proceed.  History of Present Illness          The patient, with a history of anxiety, insomnia, and hypothyroidism, presents with worsening sleep disturbances. She reports staying awake until 3 AM, engaging in various activities such as bill payments and arts and crafts. She attributes her delayed sleep to a fear of oversleeping and missing morning appointments or failing to wake her son for work. She also expresses concern about her son's wellbeing, which adds to her anxiety and sleep issues.  Despite taking gabapentin and hydroxyzine, the patient struggles to fall asleep, even when physically relaxed. She also reports a persistent dry cough, which is only relieved by promethazine with dextromethorphan cough syrup. The patient admits to smoking, although she has reduced her intake from two packs to half a pack per day. She usually smokes until she goes to bed, which may contribute to her insomnia.  The patient also mentions occasional swelling in her ankles and feet, for which she wears compression stockings. She has a history of back problems, which limit her physical activity and may contribute to her overall health issues. Despite these challenges, the patient is actively trying to make lifestyle changes to improve her sleep and overall health.     05/17/2023    9:01 AM 05/14/2023    9:10 AM 02/11/2023    3:07 PM 08/22/2022   11:41 AM 07/18/2022    2:09 PM  Depression screen PHQ 2/9  Decreased Interest 1 0 0 0 0  Down, Depressed, Hopeless 1 3 0 0 0  PHQ - 2 Score 2 3 0 0 0  Altered sleeping 3 3   0  Tired, decreased energy 2 3 0  0  Change in appetite 0 3 0  0  Feeling bad  or failure about yourself  0 1 0  0  Trouble concentrating 0 0 0  0  Moving slowly or fidgety/restless 1 0 0  0  Suicidal thoughts 0 0 0  0  PHQ-9 Score 8 13   0  Difficult doing work/chores Somewhat difficult Somewhat difficult Not difficult at all  Not difficult at all        05/17/2023    9:01 AM  Fall Risk   Falls in the past year? 0  Number falls in past yr: 0  Injury with Fall? 0  Risk for fall due to : No Fall Risks  Follow up Falls evaluation completed    Patient Care Team: Blane Ohara, MD as PCP - General (Family Medicine) Lynann Bologna, MD as PCP - Gastroenterology (Gastroenterology) Arman Bogus, MD as Consulting Physician (Neurosurgery) Asencion Islam, DPM as Consulting Physician (Podiatry) Debroah Baller, MD as Consulting Physician (Urology) Weston Settle, MD as Consulting Physician (Oncology) Zettie Pho, [facility] (Inactive) (Pharmacist) Birdie Sons, OD (Ophthalmology)   Review of Systems  Constitutional: Negative.   HENT: Negative.    Eyes: Negative.   Respiratory:  Positive for cough.   Cardiovascular: Negative.   Gastrointestinal: Negative.   Genitourinary: Negative.   Musculoskeletal:  Positive for back pain.  Allergic/Immunologic: Negative.   Neurological: Negative.   Psychiatric/Behavioral:  Positive for sleep disturbance. The patient is nervous/anxious.   All other systems reviewed and are negative.   Current Outpatient Medications on File Prior to Visit  Medication Sig Dispense Refill   albuterol (VENTOLIN HFA) 108 (90 Base) MCG/ACT inhaler Inhale 2 puffs into the lungs every 4 (four) hours as needed. 54 g 3   ARIPiprazole (ABILIFY) 5 MG tablet TAKE 1 TABLET (5 MG TOTAL) BY MOUTH DAILY. 90 tablet 1   atorvastatin (LIPITOR) 40 MG tablet TAKE 1 TABLET BY MOUTH DAILY 100 tablet 2   Azelastine HCl 137 MCG/SPRAY SOLN PLACE 2 SPRAYS INTO THE NOSE IN THE MORNING AND AT BEDTIME. 30 mL 1   bethanechol (URECHOLINE) 25 MG tablet Take 1  tablet (25 mg total) by mouth in the morning and at bedtime. 180 tablet 1   busPIRone (BUSPAR) 30 MG tablet TAKE 1 TABLET BY MOUTH TWICE A DAY 180 tablet 1   cetirizine (ZYRTEC) 10 MG tablet TAKE 1 TABLET BY MOUTH EVERYDAY AT BEDTIME 90 tablet 2   cyclobenzaprine (FLEXERIL) 10 MG tablet TAKE 1 TABLET BY MOUTH 3  TIMES DAILY 270 tablet 1   DULoxetine (CYMBALTA) 60 MG capsule TAKE 1 CAPSULE BY MOUTH  DAILY 90 capsule 3   DULoxetine (CYMBALTA) 60 MG capsule Take 1 capsule by mouth daily.     fenofibrate 160 MG tablet TAKE 1 TABLET BY MOUTH EVERY DAY 90 tablet 0   fluticasone (FLONASE) 50 MCG/ACT nasal spray SPRAY 2 SPRAYS INTO EACH NOSTRIL EVERY DAY 48 mL 3   gabapentin (NEURONTIN) 600 MG tablet TAKE 1 TABLET BY MOUTH 4 TIMES  DAILY 400 tablet 2   hydrocortisone (CORTEF) 10 MG tablet Take 10 mg by mouth 2 (two) times daily.     hydrOXYzine (VISTARIL) 25 MG capsule TAKE 1 CAPSULE (25 MG TOTAL) BY MOUTH EVERY 8 (EIGHT) HOURS AS NEEDED FOR ANXIETY OR ITCHING. 270 capsule 0   levocetirizine (XYZAL) 5 MG tablet TAKE 1 TABLET BY MOUTH EVERY DAY IN THE EVENING 90 tablet 3   levothyroxine (SYNTHROID) 75 MCG tablet TAKE 1 TABLET BY MOUTH EVERY DAY BEFORE BREAKFAST 90 tablet 2   lubiprostone (AMITIZA) 8 MCG capsule TAKE 1 CAPSULE BY MOUTH TWICE  DAILY WITH MEALS 200 capsule 2   montelukast (SINGULAIR) 10 MG tablet TAKE 1 TABLET BY MOUTH DAILY 100 tablet 2   Multiple Vitamin (MULTI-VITAMINS) TABS Take 1 tablet by mouth daily.     oxyCODONE-acetaminophen (PERCOCET) 10-325 MG tablet TAKE 1 TAB EVERY 4 HOURS FOR 5 DAYS THEN 1 TAB TWICE A DAY AS NEEDED FOR SEVERE BREAKTHROUGH PAIN  0   pantoprazole (PROTONIX) 40 MG tablet TAKE 1 TABLET BY MOUTH TWICE A DAY 180 tablet 1   primidone (MYSOLINE) 50 MG tablet TAKE 1 TABLET BY MOUTH TWICE A DAY 180 tablet 1   Propylene Glycol (SYSTANE BALANCE) 0.6 % SOLN Place 1 drop into both eyes daily as needed (dry eyes).     Tiotropium Bromide Monohydrate (SPIRIVA RESPIMAT) 2.5  MCG/ACT AERS USE 1 INHALATION BY MOUTH  TWICE DAILY 12 g 3   torsemide (DEMADEX) 10 MG tablet TAKE 1 TABLET BY MOUTH EVERY DAY 90 tablet 1   Vitamin D, Ergocalciferol, (DRISDOL) 50000 units CAPS capsule Take 50,000 Units by mouth every Wednesday.     Vitamin D, Ergocalciferol, 50000 units CAPS Take 1 capsule by mouth once a week.     No current facility-administered medications on file prior to visit.   Past Medical History:  Diagnosis Date  Adrenal insufficiency (HCC)    Anxiety    Arthritis    Carpal tunnel syndrome    Both hands, worse in right   Chronic constipation    COPD (chronic obstructive pulmonary disease) (HCC)    CVA (cerebral vascular accident) (HCC)    Depression    Facet syndrome    Fibromyalgia    GERD (gastroesophageal reflux disease)    Heart murmur    Hypercholesteremia    IBS (irritable bowel syndrome)    Ischemic stroke (HCC)    07/19/2004   Lumbar radiculopathy    Osteoporosis    Radiculopathy, lumbar region 05/02/2021   RLS (restless legs syndrome)    Spinal stenosis    TMJ (temporomandibular joint disorder)    Trochanteric bursitis of both hips    Vascular malformation    spinal cord   Past Surgical History:  Procedure Laterality Date   APPENDECTOMY     CARPAL TUNNEL RELEASE Right 12/13/2017   surgery   CATARACT EXTRACTION Bilateral    CHOLECYSTECTOMY     COLONOSCOPY  05/06/2017   Colonic polyps status post polypectomy. Interan land external hemorrhoids.    ESOPHAGOGASTRODUODENOSCOPY  04/28/2007   Mild gastrtiis. Otherwise normal EGD.    HEMORROIDECTOMY     LAMINECTOMY AND MICRODISCECTOMY LUMBAR SPINE  01/03/2005   NECK SURGERY     x2    OTHER SURGICAL HISTORY  04/24/2004   anterior cervical discectomy and fusion at C5-C6   SPINAL CORD STIMULATOR INSERTION N/A 07/21/2021   Procedure: Permanant Spinal cord stimulator placement leads and battery under fluroscopy;  Surgeon: Renaldo Fiddler, MD;  Location: [facility] OR;  Service: Neurosurgery;   Laterality: N/A;   TONSILLECTOMY     TOTAL ABDOMINAL HYSTERECTOMY      Family History  Problem Relation Age of Onset   Supraventricular tachycardia Mother    Dementia Mother    Hyperlipidemia Mother    Hypertension Mother    Osteoarthritis Mother    Osteoporosis Mother    Breast cancer Other    Breast cancer Other    Colon cancer Neg Hx    Esophageal cancer Neg Hx    Social History   Socioeconomic History   Marital status: Divorced    Spouse name: Not on file   Number of children: 1   Years of education: Not on file   Highest education level: Not on file  Occupational History   Occupation: Disabled  Tobacco Use   Smoking status: Every Day    Current packs/day: 0.50    Types: Cigarettes   Smokeless tobacco: Never  Vaping Use   Vaping status: Never Used  Substance and Sexual Activity   Alcohol use: Not Currently    Comment: quit 19 years   Drug use: Never   Sexual activity: Not Currently  Other Topics Concern   Not on file  Social History Narrative   Disabled son lives with her sometimes and his father sometimes.  Delmar is working hard to have a relationship with her son's father and his wife.  They help her when needed.   Social Drivers of Corporate investment banker Strain: Low Risk  (05/14/2023)   Overall Financial Resource Strain (CARDIA)    Difficulty of Paying Living Expenses: Not hard at all  Food Insecurity: No Food Insecurity (05/14/2023)   Hunger Vital Sign    Worried About Running Out of Food in the Last Year: Never true    Ran Out of Food in the Last Year: Never true  Transportation Needs: No Transportation Needs (05/14/2023)   PRAPARE - Administrator, Civil Service (Medical): No    Lack of Transportation (Non-Medical): No  Physical Activity: Patient Declined (05/14/2023)   Exercise Vital Sign    Days of Exercise per Week: Patient declined    Minutes of Exercise per Session: Patient declined  Stress: Stress Concern Present  (05/14/2023)   [employer] of Occupational Health - Occupational Stress Questionnaire    Feeling of Stress : Rather much  Social Connections: Socially Isolated (05/14/2023)   Social Connection and Isolation Panel [NHANES]    Frequency of Communication with Friends and Family: More than three times a week    Frequency of Social Gatherings with Friends and Family: More than three times a week    Attends Religious Services: Never    Database administrator or Organizations: No    Attends Engineer, structural: Never    Marital Status: Divorced    Objective:  BP 110/60 (BP Location: Left Arm, Patient Position: Sitting, Cuff Size: Large)   Pulse 91   Temp 98.1 F (36.7 C) (Temporal)   Resp 16   Ht 5' (1.524 m)   Wt 144 lb (65.3 kg)   SpO2 91%   BMI 28.12 kg/m      05/17/2023    8:59 AM 05/14/2023    9:05 AM 03/28/2023    2:02 PM  BP/Weight  Systolic BP 110 -- 122  Diastolic BP 60 -- 74  Wt. (Lbs) 144 140 146  BMI 28.12 kg/m2 27.34 kg/m2 28.51 kg/m2    Physical Exam Vitals and nursing note reviewed.  Constitutional:      Appearance: Normal appearance.  HENT:     Head: Normocephalic and atraumatic.     Mouth/Throat:     Mouth: Mucous membranes are moist.  Cardiovascular:     Rate and Rhythm: Normal rate and regular rhythm.     Heart sounds: Murmur heard.  Pulmonary:     Effort: Pulmonary effort is normal.     Breath sounds: Wheezing present.  Musculoskeletal:        General: Normal range of motion.     Cervical back: Normal range of motion.  Skin:    General: Skin is warm.  Neurological:     General: No focal deficit present.     Mental Status: She is alert.  Psychiatric:        Mood and Affect: Mood normal.     Diabetic Foot Exam - Simple   No data filed      Lab Results  Component Value Date   WBC 11.6 (H) 10/08/2022   HGB 14.2 10/08/2022   HCT 43.1 10/08/2022   PLT 492 (H) 10/08/2022   GLUCOSE 77 12/03/2022   CHOL 189 07/16/2022    TRIG 97 07/16/2022   HDL 102 07/16/2022   LDLCALC 70 07/16/2022   ALT 29 12/03/2022   AST 28 12/03/2022   NA 143 12/03/2022   K 4.7 12/03/2022   CL 97 12/03/2022   CREATININE 0.88 12/03/2022   BUN 17 12/03/2022   CO2 30 (H) 12/03/2022   TSH 0.688 10/08/2022      Assessment & Plan:    Primary insomnia Assessment & Plan: Chronic insomnia exacerbated by lifestyle factors and anxiety related to son's schedule. Reports going to bed at 3 AM and waking up at 6 AM to assist son. Engages in stimulating activities and smoking before bed, contributing to difficulty falling asleep.  Currently on gabapentin and hydroxyzine, which are ineffective due to lifestyle factors. Discussed risks of continued poor sleep including impaired cognitive function and overall health. Emphasized importance of sleep hygiene and gradual adjustment of sleep schedule. - Gradually move bedtime earlier by one hour each day or week. - Avoid stimulating activities and smoking at least 2-3 hours before bedtime. - Create a quiet, dim environment before bed. - No additional medications for sleep due to current extensive medication list.   COPD exacerbation (HCC) Assessment & Plan: Intermittent dry cough with a tickle in the throat, relieved by promethazine with dextromethorphan. Smoking may be contributing to the cough. Discussed risks of dextromethorphan including potential to raise blood pressure. - Refill promethazine with dextromethorphan cough syrup, advise using very sparingly. - Encourage further reduction in smoking. - continue inhalers  Orders: -     Promethazine-DM; Take 5 mLs by mouth 4 (four) times daily as needed for cough.  Dispense: 180 mL; Refill: 0 -     CBC with Differential/Platelet -     Comprehensive metabolic panel  Tobacco use disorder Assessment & Plan: Patient used to smoke 2 packs a day. Cut back to half a pack a day now. Does have COPD already Strongly recommended to cut back  smoking. To let me know if she needs more help in the process.  Time spent counseling 4 mins   Mixed hyperlipidemia Assessment & Plan: General Health Maintenance Last cholesterol check in February was within normal limits. No recent blood work since July. - Order comprehensive blood work including cholesterol levels.  Follow-up - Schedule follow-up appointment in three months.  Orders: -     Lipid panel -     Comprehensive metabolic panel  Acquired hypothyroidism Assessment & Plan: Well-managed on levothyroxine 75 mcg daily. Last blood work in July showed normal electrolytes, kidney function, liver function, and blood sugar. - Order blood work including thyroid function tests.  Orders: -     TSH -     Comprehensive metabolic panel     Meds ordered this encounter  Medications   promethazine-dextromethorphan (PROMETHAZINE-DM) 6.25-15 MG/5ML syrup    Sig: Take 5 mLs by mouth 4 (four) times daily as needed for cough.    Dispense:  180 mL    Refill:  0    Orders Placed This Encounter  Procedures   CBC with Differential/Platelet   Lipid panel   TSH   Comprehensive metabolic panel     Follow-up: No follow-ups on file.    An After Visit Summary was printed and given to the patient.  Windell Moment, MD [facility] [phone]

## 2023-05-17 NOTE — Assessment & Plan Note (Signed)
Chronic insomnia exacerbated by lifestyle factors and anxiety related to son's schedule. Reports going to bed at 3 AM and waking up at 6 AM to assist son. Engages in stimulating activities and smoking before bed, contributing to difficulty falling asleep.   Currently on gabapentin and hydroxyzine, which are ineffective due to lifestyle factors. Discussed risks of continued poor sleep including impaired cognitive function and overall health. Emphasized importance of sleep hygiene and gradual adjustment of sleep schedule. - Gradually move bedtime earlier by one hour each day or week. - Avoid stimulating activities and smoking at least 2-3 hours before bedtime. - Create a quiet, dim environment before bed. - No additional medications for sleep due to current extensive medication list.

## 2023-05-17 NOTE — Assessment & Plan Note (Signed)
General Health Maintenance Last cholesterol check in February was within normal limits. No recent blood work since July. - Order comprehensive blood work including cholesterol levels.  Follow-up - Schedule follow-up appointment in three months.

## 2023-05-17 NOTE — Assessment & Plan Note (Signed)
Intermittent dry cough with a tickle in the throat, relieved by promethazine with dextromethorphan. Smoking may be contributing to the cough. Discussed risks of dextromethorphan including potential to raise blood pressure. - Refill promethazine with dextromethorphan cough syrup, advise using very sparingly. - Encourage further reduction in smoking. - continue inhalers

## 2023-05-17 NOTE — Assessment & Plan Note (Signed)
Patient used to smoke 2 packs a day. Cut back to half a pack a day now. Does have COPD already Strongly recommended to cut back smoking. To let me know if she needs more help in the process.  Time spent counseling 4 mins

## 2023-05-17 NOTE — Assessment & Plan Note (Signed)
Well-managed on levothyroxine 75 mcg daily. Last blood work in July showed normal electrolytes, kidney function, liver function, and blood sugar. - Order blood work including thyroid function tests.

## 2023-05-18 LAB — CBC WITH DIFFERENTIAL/PLATELET
Basophils Absolute: 0.1 10*3/uL (ref 0.0–0.2)
Basos: 1 %
EOS (ABSOLUTE): 0.1 10*3/uL (ref 0.0–0.4)
Eos: 1 %
Hematocrit: 45 % (ref 34.0–46.6)
Hemoglobin: 14.8 g/dL (ref 11.1–15.9)
Immature Grans (Abs): 0.1 10*3/uL (ref 0.0–0.1)
Immature Granulocytes: 1 %
Lymphocytes Absolute: 1.6 10*3/uL (ref 0.7–3.1)
Lymphs: 15 %
MCH: 30.1 pg (ref 26.6–33.0)
MCHC: 32.9 g/dL (ref 31.5–35.7)
MCV: 92 fL (ref 79–97)
Monocytes Absolute: 0.6 10*3/uL (ref 0.1–0.9)
Monocytes: 5 %
Neutrophils Absolute: 8.6 10*3/uL — ABNORMAL HIGH (ref 1.4–7.0)
Neutrophils: 77 %
Platelets: 463 10*3/uL — ABNORMAL HIGH (ref 150–450)
RBC: 4.92 x10E6/uL (ref 3.77–5.28)
RDW: 14.7 % (ref 11.7–15.4)
WBC: 11.1 10*3/uL — ABNORMAL HIGH (ref 3.4–10.8)

## 2023-05-18 LAB — COMPREHENSIVE METABOLIC PANEL
ALT: 22 [IU]/L (ref 0–32)
AST: 24 [IU]/L (ref 0–40)
Albumin: 4.5 g/dL (ref 3.9–4.9)
Alkaline Phosphatase: 51 [IU]/L (ref 44–121)
BUN/Creatinine Ratio: 14 (ref 12–28)
BUN: 12 mg/dL (ref 8–27)
Bilirubin Total: 0.2 mg/dL (ref 0.0–1.2)
CO2: 27 mmol/L (ref 20–29)
Calcium: 9.2 mg/dL (ref 8.7–10.3)
Chloride: 99 mmol/L (ref 96–106)
Creatinine, Ser: 0.86 mg/dL (ref 0.57–1.00)
Globulin, Total: 2.1 g/dL (ref 1.5–4.5)
Glucose: 94 mg/dL (ref 70–99)
Potassium: 4.4 mmol/L (ref 3.5–5.2)
Sodium: 141 mmol/L (ref 134–144)
Total Protein: 6.6 g/dL (ref 6.0–8.5)
eGFR: 73 mL/min/{1.73_m2} (ref >59)

## 2023-05-18 LAB — LIPID PANEL
Chol/HDL Ratio: 2 {ratio} (ref 0.0–4.4)
Cholesterol, Total: 177 mg/dL (ref 100–199)
HDL: 88 mg/dL (ref >39)
LDL Chol Calc (NIH): 72 mg/dL (ref 0–99)
Triglycerides: 94 mg/dL (ref 0–149)
VLDL Cholesterol Cal: 17 mg/dL (ref 5–40)

## 2023-05-18 LAB — TSH: TSH: 1.31 u[IU]/mL (ref 0.450–4.500)

## 2023-05-19 ENCOUNTER — Other Ambulatory Visit: Payer: Self-pay | Admitting: Family Medicine

## 2023-05-21 IMAGING — RF DG THORACOLUMBAR SPINE 2V
1 series · 2 of 2 positions shown · non-contrast
Comparison: None.

CLINICAL DATA: Spinal stimulator placement

EXAM:
THORACOLUMBAR SPINE 1V

[Series 1: run · 2 of 2 slices shown]
[im 1/2]
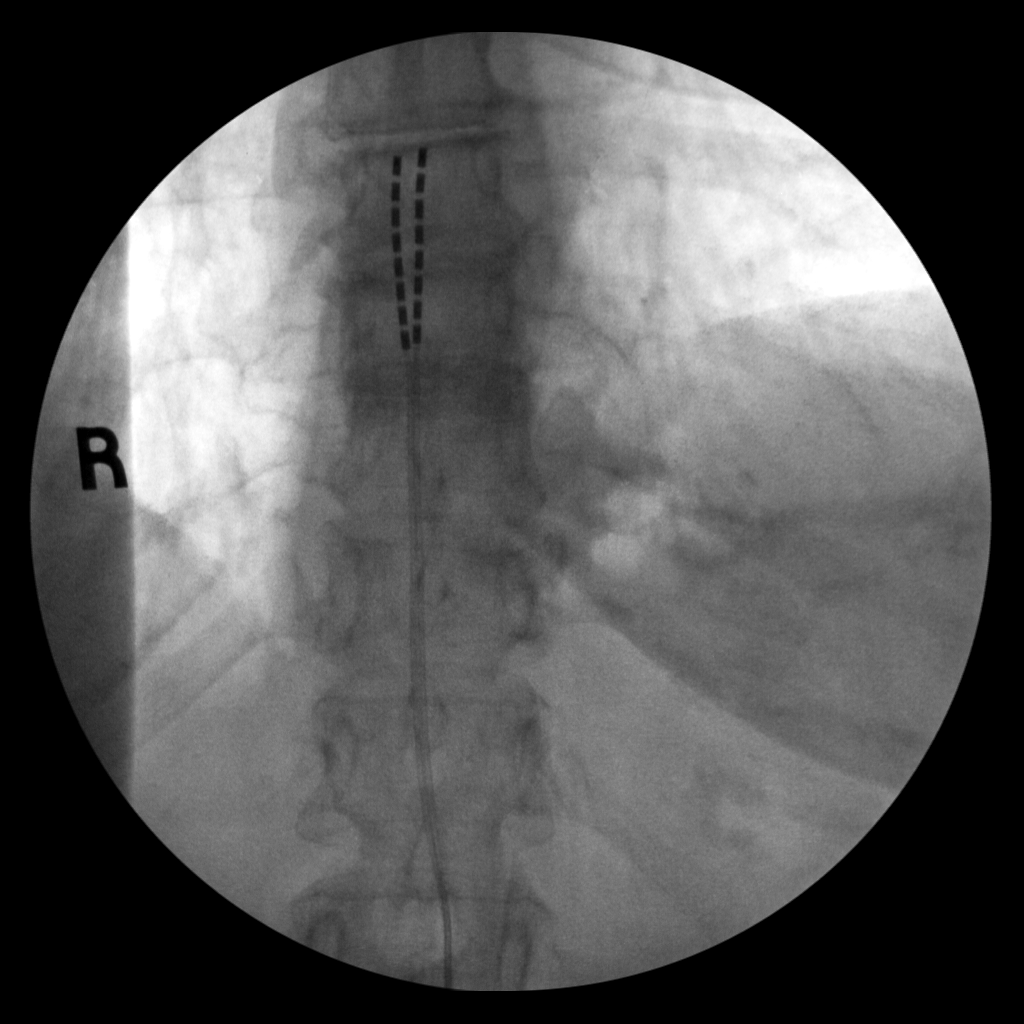
[im 2/2]
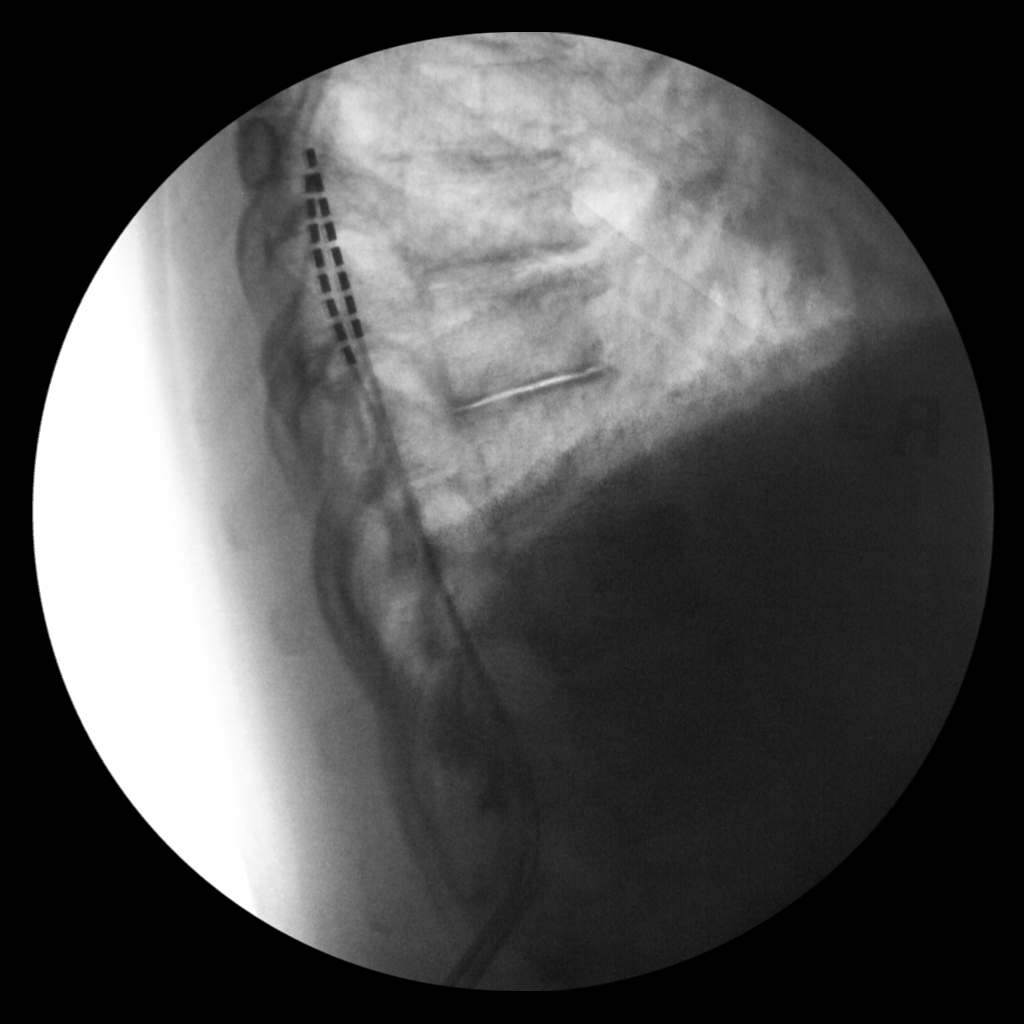

[2 of 2 positions shown; findings below may reference images not displayed]

FINDINGS: Intraoperative fluoroscopic images for spinal stimulator placement.
Total fluoroscopic time was 1 minute 45 seconds. Total dose was
46.08 mGy. Degenerate disc disease of the thoracolumbar spine.
IMPRESSION: Intraoperative utilization of fluoroscopy for spinal stimulator
placement.

## 2023-05-27 ENCOUNTER — Encounter: Payer: Self-pay | Admitting: Family Medicine

## 2023-05-27 DIAGNOSIS — M810 Age-related osteoporosis without current pathological fracture: Secondary | ICD-10-CM | POA: Diagnosis not present

## 2023-06-03 ENCOUNTER — Ambulatory Visit: Payer: Self-pay

## 2023-06-04 ENCOUNTER — Ambulatory Visit: Payer: 59 | Admitting: Physician Assistant

## 2023-06-05 ENCOUNTER — Encounter: Payer: Self-pay | Admitting: Physician Assistant

## 2023-06-05 ENCOUNTER — Ambulatory Visit (INDEPENDENT_AMBULATORY_CARE_PROVIDER_SITE_OTHER): Payer: 59 | Admitting: Physician Assistant

## 2023-06-05 VITALS — BP 116/70 | HR 88 | Temp 97.9°F | Ht 60.0 in | Wt 143.0 lb

## 2023-06-05 DIAGNOSIS — F17210 Nicotine dependence, cigarettes, uncomplicated: Secondary | ICD-10-CM

## 2023-06-05 DIAGNOSIS — R1084 Generalized abdominal pain: Secondary | ICD-10-CM

## 2023-06-05 DIAGNOSIS — K649 Unspecified hemorrhoids: Secondary | ICD-10-CM | POA: Diagnosis not present

## 2023-06-05 NOTE — Progress Notes (Signed)
 Acute Office Visit  Subjective:    Patient ID: Connie Coffey, female    DOB: [birth year], 68 y.o.   MRN: [id number]  Chief Complaint  Patient presents with   GI Problem    Discussed the use of AI scribe software for clinical note transcription with the patient, who gave verbal consent to proceed.  HPI: Patient is in today for GI issues. Patient states this has been going on for a month every time she eats. Patient states it started with runny stool then gas, then cramps. Patient states it feels like a baby is inside kicking her.   Discussed the use of AI scribe software for clinical note transcription with the patient, who gave verbal consent to proceed.  History of Present Illness   A 68 year old female with a past medical history of IBS with constipation, ulcers, hysterectomy, gallbladder removal, and appendix removal presents with worsening abdominal pain associated with eating. The pain has been ongoing for the past month and has escalated from initial diarrhea to stomach cramps and a sensation akin to a baby kicking. The patient reports frequent bowel movements, often spending most of the day in the bathroom. The patient also reports hemorrhoids and blood in the stool, described as bright red. The patient denies any vomiting, blood in urine, or abdominal discoloration. The patient is currently on multiple medications including painkillers, muscle relaxers, and Pantoprazole  for acid reflux. The patient reports that the pain is not relieved by these medications. The patient also reports a history of acid reflux severe enough to cause burning in the throat. The patient has been trying to manage the pain by avoiding food and consuming Ensure instead.       Past Medical History:  Diagnosis Date   Adrenal insufficiency (HCC)    Anxiety    Arthritis    Carpal tunnel syndrome    Both hands, worse in right   Chronic constipation    COPD (chronic obstructive pulmonary disease) (HCC)    CVA  (cerebral vascular accident) (HCC)    Depression    Facet syndrome    Fibromyalgia    GERD (gastroesophageal reflux disease)    Heart murmur    Hypercholesteremia    IBS (irritable bowel syndrome)    Ischemic stroke (HCC)    07/19/2004   Lumbar radiculopathy    Osteoporosis    Radiculopathy, lumbar region 05/02/2021   RLS (restless legs syndrome)    Spinal stenosis    TMJ (temporomandibular joint disorder)    Trochanteric bursitis of both hips    Vascular malformation    spinal cord    Past Surgical History:  Procedure Laterality Date   APPENDECTOMY     CARPAL TUNNEL RELEASE Right 12/13/2017   surgery   CATARACT EXTRACTION Bilateral    CHOLECYSTECTOMY     COLONOSCOPY  05/06/2017   Colonic polyps status post polypectomy. Interan land external hemorrhoids.    ESOPHAGOGASTRODUODENOSCOPY  04/28/2007   Mild gastrtiis. Otherwise normal EGD.    HEMORROIDECTOMY     LAMINECTOMY AND MICRODISCECTOMY LUMBAR SPINE  01/03/2005   NECK SURGERY     x2    OTHER SURGICAL HISTORY  04/24/2004   anterior cervical discectomy and fusion at C5-C6   SPINAL CORD STIMULATOR INSERTION N/A 07/21/2021   Procedure: Permanant Spinal cord stimulator placement leads and battery under fluroscopy;  Surgeon: Darlis Deatrice RAMAN, MD;  Location: [facility] OR;  Service: Neurosurgery;  Laterality: N/A;   TONSILLECTOMY     TOTAL ABDOMINAL HYSTERECTOMY  Family History  Problem Relation Age of Onset   Supraventricular tachycardia Mother    Dementia Mother    Hyperlipidemia Mother    Hypertension Mother    Osteoarthritis Mother    Osteoporosis Mother    Breast cancer Other    Breast cancer Other    Colon cancer Neg Hx    Esophageal cancer Neg Hx     Social History   Socioeconomic History   Marital status: Divorced    Spouse name: Not on file   Number of children: 1   Years of education: Not on file   Highest education level: Not on file  Occupational History   Occupation: Disabled  Tobacco Use    Smoking status: Every Day    Current packs/day: 0.50    Types: Cigarettes   Smokeless tobacco: Never  Vaping Use   Vaping status: Never Used  Substance and Sexual Activity   Alcohol use: Not Currently    Comment: quit 19 years   Drug use: Never   Sexual activity: Not Currently  Other Topics Concern   Not on file  Social History Narrative   Disabled son lives with her sometimes and his father sometimes.  Meredyth is working hard to have a relationship with her son's father and his wife.  They help her when needed.   Social Drivers of Corporate Investment Banker Strain: Low Risk  (05/14/2023)   Overall Financial Resource Strain (CARDIA)    Difficulty of Paying Living Expenses: Not hard at all  Food Insecurity: No Food Insecurity (05/14/2023)   Hunger Vital Sign    Worried About Running Out of Food in the Last Year: Never true    Ran Out of Food in the Last Year: Never true  Transportation Needs: No Transportation Needs (05/14/2023)   PRAPARE - Administrator, Civil Service (Medical): No    Lack of Transportation (Non-Medical): No  Physical Activity: Patient Declined (05/14/2023)   Exercise Vital Sign    Days of Exercise per Week: Patient declined    Minutes of Exercise per Session: Patient declined  Stress: Stress Concern Present (05/14/2023)   [employer] of Occupational Health - Occupational Stress Questionnaire    Feeling of Stress : Rather much  Social Connections: Socially Isolated (05/14/2023)   Social Connection and Isolation Panel [NHANES]    Frequency of Communication with Friends and Family: More than three times a week    Frequency of Social Gatherings with Friends and Family: More than three times a week    Attends Religious Services: Never    Database Administrator or Organizations: No    Attends Banker Meetings: Never    Marital Status: Divorced  Catering Manager Violence: Not At Risk (05/14/2023)   Humiliation, Afraid, Rape, and  Kick questionnaire    Fear of Current or Ex-Partner: No    Emotionally Abused: No    Physically Abused: No    Sexually Abused: No    Outpatient Medications Prior to Visit  Medication Sig Dispense Refill   albuterol  (VENTOLIN  HFA) 108 (90 Base) MCG/ACT inhaler Inhale 2 puffs into the lungs every 4 (four) hours as needed. 54 g 3   ARIPiprazole  (ABILIFY ) 5 MG tablet TAKE 1 TABLET (5 MG TOTAL) BY MOUTH DAILY. 90 tablet 1   atorvastatin  (LIPITOR) 40 MG tablet TAKE 1 TABLET BY MOUTH DAILY 100 tablet 2   Azelastine  HCl 137 MCG/SPRAY SOLN PLACE 2 SPRAYS INTO THE NOSE IN THE MORNING AND AT  BEDTIME. 90 mL 3   bethanechol  (URECHOLINE ) 25 MG tablet Take 1 tablet (25 mg total) by mouth in the morning and at bedtime. 180 tablet 1   busPIRone  (BUSPAR ) 30 MG tablet TAKE 1 TABLET BY MOUTH TWICE A DAY 180 tablet 1   cetirizine  (ZYRTEC ) 10 MG tablet TAKE 1 TABLET BY MOUTH EVERYDAY AT BEDTIME 90 tablet 2   cyclobenzaprine  (FLEXERIL ) 10 MG tablet TAKE 1 TABLET BY MOUTH 3  TIMES DAILY 270 tablet 1   DULoxetine  (CYMBALTA ) 60 MG capsule TAKE 1 CAPSULE BY MOUTH  DAILY 90 capsule 3   DULoxetine  (CYMBALTA ) 60 MG capsule Take 1 capsule by mouth daily.     fenofibrate  160 MG tablet TAKE 1 TABLET BY MOUTH EVERY DAY 90 tablet 0   fluticasone  (FLONASE ) 50 MCG/ACT nasal spray SPRAY 2 SPRAYS INTO EACH NOSTRIL EVERY DAY 48 mL 3   gabapentin  (NEURONTIN ) 600 MG tablet TAKE 1 TABLET BY MOUTH 4 TIMES  DAILY 400 tablet 2   hydrocortisone  (CORTEF ) 10 MG tablet Take 10 mg by mouth 2 (two) times daily.     hydrOXYzine  (VISTARIL ) 25 MG capsule TAKE 1 CAPSULE (25 MG TOTAL) BY MOUTH EVERY 8 (EIGHT) HOURS AS NEEDED FOR ANXIETY OR ITCHING. 270 capsule 0   levocetirizine (XYZAL ) 5 MG tablet TAKE 1 TABLET BY MOUTH EVERY DAY IN THE EVENING 90 tablet 3   levothyroxine  (SYNTHROID ) 75 MCG tablet TAKE 1 TABLET BY MOUTH EVERY DAY BEFORE BREAKFAST 90 tablet 2   lubiprostone  (AMITIZA ) 8 MCG capsule TAKE 1 CAPSULE BY MOUTH TWICE  DAILY WITH MEALS  200 capsule 2   montelukast  (SINGULAIR ) 10 MG tablet TAKE 1 TABLET BY MOUTH DAILY 100 tablet 2   Multiple Vitamin (MULTI-VITAMINS) TABS Take 1 tablet by mouth daily.     oxyCODONE -acetaminophen  (PERCOCET) 10-325 MG tablet TAKE 1 TAB EVERY 4 HOURS FOR 5 DAYS THEN 1 TAB TWICE A DAY AS NEEDED FOR SEVERE BREAKTHROUGH PAIN  0   pantoprazole  (PROTONIX ) 40 MG tablet TAKE 1 TABLET BY MOUTH TWICE A DAY 180 tablet 1   primidone  (MYSOLINE ) 50 MG tablet TAKE 1 TABLET BY MOUTH TWICE A DAY 180 tablet 1   promethazine -dextromethorphan (PROMETHAZINE -DM) 6.25-15 MG/5ML syrup Take 5 mLs by mouth 4 (four) times daily as needed for cough. 180 mL 0   Propylene Glycol (SYSTANE BALANCE) 0.6 % SOLN Place 1 drop into both eyes daily as needed (dry eyes).     Tiotropium Bromide Monohydrate  (SPIRIVA  RESPIMAT) 2.5 MCG/ACT AERS USE 1 INHALATION BY MOUTH  TWICE DAILY 12 g 3   torsemide  (DEMADEX ) 10 MG tablet TAKE 1 TABLET BY MOUTH EVERY DAY 90 tablet 1   Vitamin D , Ergocalciferol , (DRISDOL ) 50000 units CAPS capsule Take 50,000 Units by mouth every Wednesday.     Vitamin D , Ergocalciferol , 50000 units CAPS Take 1 capsule by mouth once a week.     No facility-administered medications prior to visit.    Allergies  Allergen Reactions   Beta Vulgaris Anaphylaxis    Beets    Penicillins Shortness Of Breath   Celecoxib Other (See Comments)    because of stomach ulcers   Chantix [Varenicline]     Makes pt sick on her stomach    Dog Epithelium (Canis Lupus Familiaris)     Cats and dogs   Fish Oil Other (See Comments)    hemorrhoids   Gramineae Pollens    Molds & Smuts    Nsaids Other (See Comments)    GI Upset. Ulcers  Other Other (See Comments)    beets  Including Celebrex   Tape Dermatitis    Electrodes caused blistering   Nicotine Nausea Only and Other (See Comments)    Review of Systems  Constitutional:  Negative for appetite change, chills, fatigue and fever.  HENT:  Negative for congestion, ear  discharge, ear pain, rhinorrhea, sinus pressure, sneezing and sore throat.   Eyes:  Negative for visual disturbance.  Respiratory:  Negative for cough, chest tightness, shortness of breath and wheezing.   Cardiovascular:  Negative for chest pain, palpitations and leg swelling.  Gastrointestinal:  Positive for abdominal pain and diarrhea. Negative for nausea and vomiting.  Endocrine: Negative for polydipsia, polyphagia and polyuria.  Genitourinary:  Negative for difficulty urinating, dysuria, frequency, hematuria, menstrual problem, urgency, vaginal bleeding, vaginal discharge and vaginal pain.  Musculoskeletal:  Negative for back pain, gait problem, joint swelling, myalgias and neck pain.  Neurological:  Negative for dizziness, seizures, syncope, weakness, numbness and headaches.  Psychiatric/Behavioral:  Negative for agitation, confusion, hallucinations, sleep disturbance and suicidal ideas. The patient is not nervous/anxious.        Objective:        06/05/2023    3:57 PM 05/17/2023    8:59 AM 05/14/2023    9:05 AM  Vitals with BMI  Height 5' 0 5' 0 5' 0  Weight 143 lbs 144 lbs 140 lbs  BMI 27.93 28.12 27.34  Systolic 116 110 --  Diastolic 70 60 --  Pulse 88 91     No data found.   Physical Exam Vitals reviewed.  Constitutional:      Appearance: Normal appearance.  Neck:     Vascular: No carotid bruit.  Cardiovascular:     Rate and Rhythm: Normal rate and regular rhythm.     Heart sounds: Normal heart sounds.  Pulmonary:     Effort: Pulmonary effort is normal.     Breath sounds: Normal breath sounds.  Abdominal:     General: Bowel sounds are normal. There is no abdominal bruit.     Palpations: Abdomen is soft. There is no pulsatile mass.     Tenderness: There is generalized abdominal tenderness. There is no right CVA tenderness, left CVA tenderness, guarding or rebound. Negative signs include Murphy's sign and McBurney's sign.     Hernia: No hernia is present.   Neurological:     Mental Status: She is alert and oriented to person, place, and time.  Psychiatric:        Mood and Affect: Mood normal.        Behavior: Behavior normal.     Health Maintenance Due  Topic Date Due   Zoster Vaccines- Shingrix (1 of 2) Never done   Lung Cancer Screening  Never done   INFLUENZA VACCINE  12/27/2022   COVID-19 Vaccine (5 - 2024-25 season) 01/27/2023    There are no preventive care reminders to display for this patient.   Lab Results  Component Value Date   TSH 1.310 05/17/2023   Lab Results  Component Value Date   WBC 11.1 (H) 05/17/2023   HGB 14.8 05/17/2023   HCT 45.0 05/17/2023   MCV 92 05/17/2023   PLT 463 (H) 05/17/2023   Lab Results  Component Value Date   NA 141 05/17/2023   K 4.4 05/17/2023   CO2 27 05/17/2023   GLUCOSE 94 05/17/2023   BUN 12 05/17/2023   CREATININE 0.86 05/17/2023   BILITOT 0.2 05/17/2023   ALKPHOS 51 05/17/2023  AST 24 05/17/2023   ALT 22 05/17/2023   PROT 6.6 05/17/2023   ALBUMIN 4.5 05/17/2023   CALCIUM  9.2 05/17/2023   ANIONGAP 8 07/21/2021   EGFR 73 05/17/2023   Lab Results  Component Value Date   CHOL 177 05/17/2023   Lab Results  Component Value Date   HDL 88 05/17/2023   Lab Results  Component Value Date   LDLCALC 72 05/17/2023   Lab Results  Component Value Date   TRIG 94 05/17/2023   Lab Results  Component Value Date   CHOLHDL 2.0 05/17/2023   No results found for: HGBA1C     Assessment & Plan:  Generalized abdominal pain Assessment & Plan: Patient reports worsening abdominal pain for the past month, associated with diarrhea and cramping. Pain is generalized, below the umbilicus, and worsens with eating. History of ulcers and IBS. Patient is tender to palpation on the right side of the abdomen. -Order abdominal ultrasound to evaluate for potential causes of pain. -Refer to Gastroenterology for potential endoscopy, pending patient providing previous GI doctor's  information.  Orders: -     US  Abdomen Complete; Future  Cigarette smoker Assessment & Plan: Patient reports attempts to cut back on smoking by using mints. -Encourage continued efforts to quit smoking.   Hemorrhoids, unspecified hemorrhoid type Assessment & Plan: Patient reports increased discomfort and bright red blood in stool, likely secondary to increased bowel movements. -Advise patient to monitor for changes in stool color or consistency, and to report any changes promptly.      No orders of the defined types were placed in this encounter.   Orders Placed This Encounter  Procedures   US  Abdomen Complete         Follow-up: No follow-ups on file.  An After Visit Summary was printed and given to the patient.  Nola Angles, [place] [facility] [phone]

## 2023-06-06 ENCOUNTER — Other Ambulatory Visit: Payer: Self-pay

## 2023-06-06 DIAGNOSIS — K649 Unspecified hemorrhoids: Secondary | ICD-10-CM | POA: Insufficient documentation

## 2023-06-06 DIAGNOSIS — R1084 Generalized abdominal pain: Secondary | ICD-10-CM | POA: Insufficient documentation

## 2023-06-06 NOTE — Assessment & Plan Note (Signed)
 Patient reports worsening abdominal pain for the past month, associated with diarrhea and cramping. Pain is generalized, below the umbilicus, and worsens with eating. History of ulcers and IBS. Patient is tender to palpation on the right side of the abdomen. -Order abdominal ultrasound to evaluate for potential causes of pain. -Refer to Gastroenterology for potential endoscopy, pending patient providing previous GI doctor's information.

## 2023-06-06 NOTE — Assessment & Plan Note (Signed)
 Patient reports increased discomfort and bright red blood in stool, likely secondary to increased bowel movements. -Advise patient to monitor for changes in stool color or consistency, and to report any changes promptly.

## 2023-06-06 NOTE — Assessment & Plan Note (Signed)
 Patient reports attempts to cut back on smoking by using mints. -Encourage continued efforts to quit smoking.

## 2023-06-11 ENCOUNTER — Telehealth: Payer: Self-pay

## 2023-06-11 NOTE — Telephone Encounter (Signed)
 Copied from CRM [id number]. Topic: Referral - Question >> Jun 07, 2023  2:04 PM Ivette P wrote: Reason for CRM: Pt is looking to see specialist Dr Ozell Skene and Dr. Nola Angles asking for the name of the specialist.  Specialist states he also needs records of the conversation/appointment 01/08 with Dr.Craft and if any new prescriptions pt is taking. If call back is needed call [phone]

## 2023-06-13 NOTE — Telephone Encounter (Signed)
Patient Made Aware, Verbalized Understanding. 

## 2023-06-16 ENCOUNTER — Other Ambulatory Visit: Payer: Self-pay | Admitting: Physician Assistant

## 2023-06-18 DIAGNOSIS — R1084 Generalized abdominal pain: Secondary | ICD-10-CM | POA: Diagnosis not present

## 2023-06-18 DIAGNOSIS — Z860100 Personal history of colon polyps, unspecified: Secondary | ICD-10-CM | POA: Diagnosis not present

## 2023-06-18 DIAGNOSIS — Z87898 Personal history of other specified conditions: Secondary | ICD-10-CM | POA: Diagnosis not present

## 2023-06-20 ENCOUNTER — Telehealth: Payer: Self-pay

## 2023-06-20 NOTE — Telephone Encounter (Signed)
Copied from CRM [id number]. Topic: Referral - Question >> Jun 19, 2023  4:29 PM Geroge Baseman wrote: Reason for CRM: Patient is getting a CT of the abdomen from her GI doctor and she said she does not need the one from brady craft. Also, patient states she had a lung screening was completed in December, can these results be used for upcoming appointment she has. Please call patient to advise [phone]

## 2023-06-20 NOTE — Telephone Encounter (Signed)
Spoke with patient who stated her GI Dr. Is going to order a CT of her abdomen (therefore she does not need the [place] of her abdomen ordered by Huston Foley). He also seen that she had a CT of her Lungs 05/15/2023 and would not need to order a CT of her chest. I will cancel the [place] order.

## 2023-06-25 DIAGNOSIS — K7689 Other specified diseases of liver: Secondary | ICD-10-CM | POA: Diagnosis not present

## 2023-06-25 DIAGNOSIS — M4316 Spondylolisthesis, lumbar region: Secondary | ICD-10-CM | POA: Diagnosis not present

## 2023-06-25 DIAGNOSIS — Q438 Other specified congenital malformations of intestine: Secondary | ICD-10-CM | POA: Diagnosis not present

## 2023-06-25 DIAGNOSIS — K561 Intussusception: Secondary | ICD-10-CM | POA: Diagnosis not present

## 2023-06-25 DIAGNOSIS — I700 Atherosclerosis of aorta: Secondary | ICD-10-CM | POA: Diagnosis not present

## 2023-06-25 DIAGNOSIS — J9811 Atelectasis: Secondary | ICD-10-CM | POA: Diagnosis not present

## 2023-06-25 DIAGNOSIS — Q630 Accessory kidney: Secondary | ICD-10-CM | POA: Diagnosis not present

## 2023-06-25 DIAGNOSIS — R935 Abnormal findings on diagnostic imaging of other abdominal regions, including retroperitoneum: Secondary | ICD-10-CM | POA: Diagnosis not present

## 2023-06-25 DIAGNOSIS — D1779 Benign lipomatous neoplasm of other sites: Secondary | ICD-10-CM | POA: Diagnosis not present

## 2023-06-25 DIAGNOSIS — R1084 Generalized abdominal pain: Secondary | ICD-10-CM | POA: Diagnosis not present

## 2023-06-25 DIAGNOSIS — K5730 Diverticulosis of large intestine without perforation or abscess without bleeding: Secondary | ICD-10-CM | POA: Diagnosis not present

## 2023-07-01 DIAGNOSIS — G894 Chronic pain syndrome: Secondary | ICD-10-CM | POA: Diagnosis not present

## 2023-07-01 DIAGNOSIS — M5416 Radiculopathy, lumbar region: Secondary | ICD-10-CM | POA: Diagnosis not present

## 2023-07-01 DIAGNOSIS — Z9689 Presence of other specified functional implants: Secondary | ICD-10-CM | POA: Diagnosis not present

## 2023-07-07 ENCOUNTER — Other Ambulatory Visit: Payer: Self-pay | Admitting: Family Medicine

## 2023-07-07 DIAGNOSIS — K219 Gastro-esophageal reflux disease without esophagitis: Secondary | ICD-10-CM

## 2023-07-09 DIAGNOSIS — E2740 Unspecified adrenocortical insufficiency: Secondary | ICD-10-CM | POA: Diagnosis not present

## 2023-07-09 DIAGNOSIS — J432 Centrilobular emphysema: Secondary | ICD-10-CM | POA: Diagnosis not present

## 2023-07-09 DIAGNOSIS — F17210 Nicotine dependence, cigarettes, uncomplicated: Secondary | ICD-10-CM | POA: Diagnosis not present

## 2023-07-18 ENCOUNTER — Telehealth: Payer: Self-pay

## 2023-07-18 NOTE — Telephone Encounter (Signed)
Copied from CRM [id number]. Topic: Clinical - Request for Lab/Test Order >> Jul 18, 2023  4:02 PM Prudencio Pair wrote: Reason for CRM: Patient called stating that she was sent to have an Ultrasound of abdomen by Dr. Andria Meuse. She states the radiology clinic is constantly calling her to schedule the appt. She wanted to let Dr. Andria Meuse know that her driver is currently in a wheelchair right now & she states her G.I. doctor is doing what he can right now.

## 2023-07-25 ENCOUNTER — Other Ambulatory Visit: Payer: Self-pay | Admitting: Family Medicine

## 2023-07-25 DIAGNOSIS — M5416 Radiculopathy, lumbar region: Secondary | ICD-10-CM | POA: Diagnosis not present

## 2023-07-26 ENCOUNTER — Other Ambulatory Visit: Payer: Self-pay | Admitting: Family Medicine

## 2023-07-26 DIAGNOSIS — J441 Chronic obstructive pulmonary disease with (acute) exacerbation: Secondary | ICD-10-CM

## 2023-07-26 DIAGNOSIS — R600 Localized edema: Secondary | ICD-10-CM

## 2023-07-26 DIAGNOSIS — F331 Major depressive disorder, recurrent, moderate: Secondary | ICD-10-CM

## 2023-07-26 DIAGNOSIS — E782 Mixed hyperlipidemia: Secondary | ICD-10-CM

## 2023-07-30 ENCOUNTER — Other Ambulatory Visit: Payer: Self-pay

## 2023-07-30 DIAGNOSIS — E782 Mixed hyperlipidemia: Secondary | ICD-10-CM

## 2023-07-30 DIAGNOSIS — R600 Localized edema: Secondary | ICD-10-CM

## 2023-07-30 DIAGNOSIS — F331 Major depressive disorder, recurrent, moderate: Secondary | ICD-10-CM

## 2023-07-30 DIAGNOSIS — J441 Chronic obstructive pulmonary disease with (acute) exacerbation: Secondary | ICD-10-CM

## 2023-07-30 DIAGNOSIS — K219 Gastro-esophageal reflux disease without esophagitis: Secondary | ICD-10-CM

## 2023-07-30 NOTE — Telephone Encounter (Signed)
 Copied from CRM [id number]. Topic: Clinical - Prescription Issue >> Jul 30, 2023 10:37 AM Bo Mcclintock wrote: Reason for CRM: [facility]  is requesting all active rx prescriptions for the pt be sent to them, they are new Pharmacy. The representative also stated they faxed over forms requesting this information on 03/01 and they want to know if it has been received

## 2023-08-02 DIAGNOSIS — K869 Disease of pancreas, unspecified: Secondary | ICD-10-CM | POA: Diagnosis not present

## 2023-08-02 DIAGNOSIS — Z9049 Acquired absence of other specified parts of digestive tract: Secondary | ICD-10-CM | POA: Diagnosis not present

## 2023-08-02 DIAGNOSIS — K862 Cyst of pancreas: Secondary | ICD-10-CM | POA: Diagnosis not present

## 2023-08-02 DIAGNOSIS — I700 Atherosclerosis of aorta: Secondary | ICD-10-CM | POA: Diagnosis not present

## 2023-08-02 DIAGNOSIS — K838 Other specified diseases of biliary tract: Secondary | ICD-10-CM | POA: Diagnosis not present

## 2023-08-05 MED ORDER — SYSTANE BALANCE 0.6 % OP SOLN: 1.0000 [drp] | Freq: Every day | OPHTHALMIC | 3 refills | Status: DC | PRN

## 2023-08-05 MED ORDER — HYDROCORTISONE 10 MG PO TABS: 10.0000 mg | ORAL_TABLET | Freq: Two times a day (BID) | ORAL | 0 refills | Status: DC

## 2023-08-05 MED ORDER — BUSPIRONE HCL 30 MG PO TABS: 30.0000 mg | ORAL_TABLET | Freq: Two times a day (BID) | ORAL | 1 refills | Status: DC

## 2023-08-05 MED ORDER — MONTELUKAST SODIUM 10 MG PO TABS: 10.0000 mg | ORAL_TABLET | Freq: Every day | ORAL | 2 refills | Status: DC

## 2023-08-05 MED ORDER — CETIRIZINE HCL 10 MG PO TABS: ORAL_TABLET | ORAL | 2 refills | Status: DC

## 2023-08-05 MED ORDER — PANTOPRAZOLE SODIUM 40 MG PO TBEC: 40.0000 mg | DELAYED_RELEASE_TABLET | Freq: Two times a day (BID) | ORAL | 1 refills | Status: DC

## 2023-08-05 MED ORDER — ALBUTEROL SULFATE HFA 108 (90 BASE) MCG/ACT IN AERS: 2.0000 | INHALATION_SPRAY | Freq: Four times a day (QID) | RESPIRATORY_TRACT | 3 refills | Status: DC | PRN

## 2023-08-05 MED ORDER — LUBIPROSTONE 8 MCG PO CAPS: 8.0000 ug | ORAL_CAPSULE | Freq: Every day | ORAL | 2 refills | Status: DC

## 2023-08-05 MED ORDER — LEVOCETIRIZINE DIHYDROCHLORIDE 5 MG PO TABS: 5.0000 mg | ORAL_TABLET | Freq: Every evening | ORAL | 3 refills | Status: DC

## 2023-08-05 MED ORDER — HYDROXYZINE PAMOATE 25 MG PO CAPS
25.0000 mg | ORAL_CAPSULE | Freq: Three times a day (TID) | ORAL | 0 refills | Status: DC | PRN
Start: 2023-08-05 — End: 2023-10-21

## 2023-08-05 MED ORDER — ARIPIPRAZOLE 5 MG PO TABS: 5.0000 mg | ORAL_TABLET | Freq: Every day | ORAL | 1 refills | Status: DC

## 2023-08-05 MED ORDER — VITAMIN D (ERGOCALCIFEROL) 1.25 MG (50000 UNIT) PO CAPS: 50000.0000 [IU] | ORAL_CAPSULE | ORAL | 4 refills | Status: DC

## 2023-08-05 MED ORDER — FUROSEMIDE 20 MG PO TABS: 20.0000 mg | ORAL_TABLET | Freq: Every day | ORAL | 0 refills | Status: DC

## 2023-08-05 MED ORDER — PROMETHAZINE-DM 6.25-15 MG/5ML PO SYRP: 5.0000 mL | ORAL_SOLUTION | Freq: Four times a day (QID) | ORAL | 0 refills | Status: DC | PRN

## 2023-08-05 MED ORDER — PRIMIDONE 50 MG PO TABS
50.0000 mg | ORAL_TABLET | Freq: Two times a day (BID) | ORAL | 1 refills | Status: AC
Start: 2023-08-05 — End: ?

## 2023-08-05 MED ORDER — BETHANECHOL CHLORIDE 25 MG PO TABS: 25.0000 mg | ORAL_TABLET | Freq: Two times a day (BID) | ORAL | 1 refills | Status: DC

## 2023-08-05 MED ORDER — FENOFIBRATE 160 MG PO TABS: 160.0000 mg | ORAL_TABLET | Freq: Every day | ORAL | 0 refills | Status: DC

## 2023-08-05 MED ORDER — SPIRIVA RESPIMAT 2.5 MCG/ACT IN AERS: INHALATION_SPRAY | RESPIRATORY_TRACT | 3 refills | Status: DC

## 2023-08-05 MED ORDER — ATORVASTATIN CALCIUM 40 MG PO TABS: 40.0000 mg | ORAL_TABLET | Freq: Every day | ORAL | 2 refills | Status: DC

## 2023-08-05 MED ORDER — DULOXETINE HCL 60 MG PO CPEP: 60.0000 mg | ORAL_CAPSULE | Freq: Every day | ORAL | 3 refills | Status: DC

## 2023-08-05 MED ORDER — OXYCODONE-ACETAMINOPHEN 10-325 MG PO TABS: 1.0000 | ORAL_TABLET | Freq: Three times a day (TID) | ORAL | 0 refills | Status: DC | PRN

## 2023-08-05 MED ORDER — CYCLOBENZAPRINE HCL 10 MG PO TABS: 10.0000 mg | ORAL_TABLET | Freq: Three times a day (TID) | ORAL | 1 refills | Status: DC

## 2023-08-05 MED ORDER — LEVOTHYROXINE SODIUM 75 MCG PO TABS: 75.0000 ug | ORAL_TABLET | Freq: Every day | ORAL | 2 refills | Status: DC

## 2023-08-05 MED ORDER — AZELASTINE HCL 137 MCG/SPRAY NA SOLN: 2.0000 | Freq: Two times a day (BID) | NASAL | 3 refills | Status: DC

## 2023-08-05 MED ORDER — FLUTICASONE PROPIONATE 50 MCG/ACT NA SUSP: 1.0000 | Freq: Every day | NASAL | 3 refills | Status: DC

## 2023-08-05 MED ORDER — TORSEMIDE 10 MG PO TABS: 10.0000 mg | ORAL_TABLET | Freq: Every day | ORAL | 1 refills | Status: DC

## 2023-08-05 MED ORDER — GABAPENTIN 600 MG PO TABS: 600.0000 mg | ORAL_TABLET | Freq: Four times a day (QID) | ORAL | 2 refills | Status: DC

## 2023-08-16 ENCOUNTER — Other Ambulatory Visit: Payer: Self-pay | Admitting: Physician Assistant

## 2023-08-16 ENCOUNTER — Other Ambulatory Visit: Payer: Self-pay

## 2023-08-16 DIAGNOSIS — M5134 Other intervertebral disc degeneration, thoracic region: Secondary | ICD-10-CM

## 2023-08-16 MED ORDER — OXYCODONE-ACETAMINOPHEN 10-325 MG PO TABS: 1.0000 | ORAL_TABLET | Freq: Three times a day (TID) | ORAL | 0 refills | Status: AC | PRN

## 2023-08-20 ENCOUNTER — Ambulatory Visit: Payer: 59

## 2023-08-20 ENCOUNTER — Ambulatory Visit: Payer: 59 | Admitting: Physician Assistant

## 2023-08-20 DIAGNOSIS — Z1211 Encounter for screening for malignant neoplasm of colon: Secondary | ICD-10-CM | POA: Diagnosis not present

## 2023-08-20 DIAGNOSIS — R1084 Generalized abdominal pain: Secondary | ICD-10-CM | POA: Diagnosis not present

## 2023-08-20 DIAGNOSIS — D122 Benign neoplasm of ascending colon: Secondary | ICD-10-CM | POA: Diagnosis not present

## 2023-08-20 DIAGNOSIS — K3189 Other diseases of stomach and duodenum: Secondary | ICD-10-CM | POA: Diagnosis not present

## 2023-08-20 DIAGNOSIS — Z8711 Personal history of peptic ulcer disease: Secondary | ICD-10-CM | POA: Diagnosis not present

## 2023-08-20 DIAGNOSIS — D120 Benign neoplasm of cecum: Secondary | ICD-10-CM | POA: Diagnosis not present

## 2023-08-20 DIAGNOSIS — Z860100 Personal history of colon polyps, unspecified: Secondary | ICD-10-CM | POA: Diagnosis not present

## 2023-08-20 DIAGNOSIS — K5730 Diverticulosis of large intestine without perforation or abscess without bleeding: Secondary | ICD-10-CM | POA: Diagnosis not present

## 2023-08-20 DIAGNOSIS — Z87898 Personal history of other specified conditions: Secondary | ICD-10-CM | POA: Diagnosis not present

## 2023-08-20 DIAGNOSIS — D124 Benign neoplasm of descending colon: Secondary | ICD-10-CM | POA: Diagnosis not present

## 2023-08-20 DIAGNOSIS — D123 Benign neoplasm of transverse colon: Secondary | ICD-10-CM | POA: Diagnosis not present

## 2023-08-20 DIAGNOSIS — K635 Polyp of colon: Secondary | ICD-10-CM | POA: Diagnosis not present

## 2023-08-20 DIAGNOSIS — K648 Other hemorrhoids: Secondary | ICD-10-CM | POA: Diagnosis not present

## 2023-08-21 ENCOUNTER — Telehealth: Payer: Self-pay

## 2023-08-21 NOTE — Telephone Encounter (Signed)
 I am unsure what results she is speaking of. I do not see MRI results.  Copied from CRM [id number]. Topic: Clinical - Lab/Test Results >> Aug 21, 2023 10:37 AM Clayton Bibles wrote: Reason for CRM:  Connie Coffey would like for a nurse to call her back about the results from the MRI MRCP test. The GI doctor said to contact Dr. Sedalia Muta about results and see if additional test needs to me done or does she need a referral to a heart doctor.  Please call her at [phone].

## 2023-08-22 ENCOUNTER — Telehealth: Payer: Self-pay | Admitting: Family Medicine

## 2023-08-22 ENCOUNTER — Other Ambulatory Visit: Payer: Self-pay | Admitting: Family Medicine

## 2023-08-22 DIAGNOSIS — I719 Aortic aneurysm of unspecified site, without rupture: Secondary | ICD-10-CM

## 2023-08-22 DIAGNOSIS — I700 Atherosclerosis of aorta: Secondary | ICD-10-CM | POA: Insufficient documentation

## 2023-08-22 NOTE — Telephone Encounter (Signed)
 I spoke with patient about results below. I recommended refer to cardiovascular surgery urgently. Patient is asymptomatic. I have reviewed symptoms of a dissection and to call 911 if she were to develop chest pain radiating to thoracic back. Left message for vascular surgeon to call to confirm the urgency of the referral. Awaiting call back still.  Dr. Sedalia Muta   MRCP 08/02/2023: in Epic Everywhere.  IMPRESSION:  1. Multiple tiny subcentimeter cysts scattered throughout the  pancreas, particularly within the pancreatic head, individually  measuring up to 0.6 cm. No solid component or suspicious contrast  enhancement. No pancreatic ductal dilatation or surrounding  inflammatory changes. These are most likely tiny side branch IPMNs  or alternately pseudocystic change related to prior pancreatitis.  Given subcentimeter size, no further follow-up or characterization  is required.  2. Status post cholecystectomy. Unchanged postoperative biliary  dilatation. Duct tapers smoothly to the ampulla without calculus or  other obstruction.  3. Severe aortic atherosclerosis notable for a penetrating  ulceration or focal dissection of the lower thoracic aorta.   Aortic Atherosclerosis (ICD10-I70.0).    Dr. Sedalia Muta

## 2023-08-22 NOTE — Telephone Encounter (Signed)
 Left message informing patient she needs to contact her GI doctor for MRI results.

## 2023-08-26 ENCOUNTER — Ambulatory Visit: Admitting: Physician Assistant

## 2023-08-28 ENCOUNTER — Other Ambulatory Visit: Payer: Self-pay | Admitting: Physician Assistant

## 2023-08-28 DIAGNOSIS — M5134 Other intervertebral disc degeneration, thoracic region: Secondary | ICD-10-CM

## 2023-08-30 ENCOUNTER — Ambulatory Visit (INDEPENDENT_AMBULATORY_CARE_PROVIDER_SITE_OTHER): Admitting: Physician Assistant

## 2023-08-30 ENCOUNTER — Encounter: Payer: Self-pay | Admitting: Physician Assistant

## 2023-08-30 VITALS — BP 128/68 | HR 98 | Temp 98.2°F | Ht 61.5 in | Wt 147.0 lb

## 2023-08-30 DIAGNOSIS — K589 Irritable bowel syndrome without diarrhea: Secondary | ICD-10-CM | POA: Insufficient documentation

## 2023-08-30 DIAGNOSIS — E213 Hyperparathyroidism, unspecified: Secondary | ICD-10-CM | POA: Diagnosis not present

## 2023-08-30 DIAGNOSIS — E782 Mixed hyperlipidemia: Secondary | ICD-10-CM | POA: Diagnosis not present

## 2023-08-30 DIAGNOSIS — I719 Aortic aneurysm of unspecified site, without rupture: Secondary | ICD-10-CM

## 2023-08-30 DIAGNOSIS — Z1231 Encounter for screening mammogram for malignant neoplasm of breast: Secondary | ICD-10-CM | POA: Diagnosis not present

## 2023-08-30 DIAGNOSIS — K580 Irritable bowel syndrome with diarrhea: Secondary | ICD-10-CM | POA: Diagnosis not present

## 2023-08-30 DIAGNOSIS — J0110 Acute frontal sinusitis, unspecified: Secondary | ICD-10-CM | POA: Diagnosis not present

## 2023-08-30 MED ORDER — AZITHROMYCIN 250 MG PO TABS
ORAL_TABLET | ORAL | 0 refills | Status: DC
Start: 2023-08-30 — End: 2023-10-31

## 2023-08-30 NOTE — Patient Instructions (Signed)
 VISIT SUMMARY:  You came in today with a severe headache, sinus pressure, and a runny nose. You also mentioned ongoing fatigue, stomach issues, and sleep disturbances. We discussed your recent MRI report indicating a severe heart condition and your history of ear infections.  YOUR PLAN:  -HEART CONDITION: You have a severe heart condition identified on your recent MRI, and we are awaiting further evaluation by a cardiologist. Until then, please avoid any strenuous activities. We will contact [facility] to expedite your appointment scheduling.  -SINUSITIS: You have acute sinusitis, which is an infection causing headache, sinus pressure, and eye watering. We will perform a COVID-19 test to rule out infection and prescribe azithromycin (Z-Pak) for the sinus infection. Please monitor for any secondary infections, such as ear infections.  -IRRITABLE BOWEL SYNDROME (IBS): You have chronic IBS, which is a Mayeda-term condition affecting your digestive system. Your symptoms have improved with dicyclomine, and you should continue taking it for symptom management.  -INSOMNIA: You have chronic insomnia, which is difficulty sleeping, often accompanied by nightmares. If your symptoms persist, we may consider referring you to a sleep specialist.  -GENERAL HEALTH MAINTENANCE: You have received three COVID-19 vaccinations. We discussed the potential need for yearly COVID-19 vaccines due to virus mutation trends. We will revisit this during future visits.  INSTRUCTIONS:  Please follow up on your cardiology appointment scheduling with [facility]. We will perform lab tests during your current visit and contact you with the results and details about your cardiology appointment.

## 2023-08-30 NOTE — Assessment & Plan Note (Signed)
 Labs drawn today Continue taking Fenofibrate 160mg , Lipitor 40mg ,  as directed Will adjust treatment depending on results Lab Results  Component Value Date   LDLCALC 72 05/17/2023

## 2023-08-30 NOTE — Assessment & Plan Note (Signed)
 Acute sinusitis with headache, sinus pressure, and eye watering. Differential diagnosis includes COVID-19. Discussed secondary infections such as ear infections following viral sinus infections. - Perform COVID-19 test to rule out infection. - Prescribe azithromycin (Z-Pak) for sinus infection. - Monitor for secondary infections such as ear infections.

## 2023-08-30 NOTE — Progress Notes (Signed)
 Subjective:  Patient ID: Connie Coffey, female    DOB: 1956/04/04  Age: 68 y.o. MRN: [id number]  Chief Complaint  Patient presents with   Medical Management of Chronic Issues    Discussed the use of AI scribe software for clinical note transcription with the patient, who gave verbal consent to proceed.  The patient, with a history of anxiety, insomnia, and hypothyroidism, presents with worsening sleep disturbances. She reports staying awake until 3 AM, engaging in various activities such as bill payments and arts and crafts. She attributes her delayed sleep to a fear of oversleeping and missing morning appointments or failing to wake her son for work. She also expresses concern about her son's wellbeing, which adds to her anxiety and sleep issues.   Despite taking gabapentin and hydroxyzine, the patient struggles to fall asleep, even when physically relaxed. The patient admits to smoking, although she has reduced her intake from two packs to half a pack per day. She usually smokes until she goes to bed, which may contribute to her insomnia.   She has a history of back problems, which limit her physical activity and may contribute to her overall health issues. Despite these challenges, the patient is actively trying to make lifestyle changes to improve her sleep and overall health.       08/30/2023    8:30 AM 05/17/2023    9:01 AM 05/14/2023    9:10 AM 02/11/2023    3:07 PM 08/22/2022   11:41 AM  Depression screen PHQ 2/9  Decreased Interest 1 1 0 0 0  Down, Depressed, Hopeless 1 1 3  0 0  PHQ - 2 Score 2 2 3  0 0  Altered sleeping 2 3 3     Tired, decreased energy 2 2 3  0   Change in appetite 0 0 3 0   Feeling bad or failure about yourself  0 0 1 0   Trouble concentrating 0 0 0 0   Moving slowly or fidgety/restless 1 1 0 0   Suicidal thoughts 0 0 0 0   PHQ-9 Score 7 8 13     Difficult doing work/chores Somewhat difficult Somewhat difficult Somewhat difficult Not difficult at all          08/30/2023    8:24 AM  Fall Risk   Falls in the past year? 0  Number falls in past yr: 0  Injury with Fall? 0  Risk for fall due to : No Fall Risks  Follow up Falls evaluation completed    Patient Care Team: Blane Ohara, MD as PCP - General (Family Medicine) Lynann Bologna, MD as PCP - Gastroenterology (Gastroenterology) Arman Bogus, MD as Consulting Physician (Neurosurgery) Asencion Islam, DPM as Consulting Physician (Podiatry) Debroah Baller, MD as Consulting Physician (Urology) Weston Settle, MD as Consulting Physician (Oncology) Zettie Pho, [facility] (Inactive) (Pharmacist) Birdie Sons, OD (Ophthalmology)   Review of Systems  Constitutional:  Positive for diaphoresis. Negative for chills, fatigue and fever.  HENT:  Negative for congestion, ear pain, rhinorrhea and sore throat.   Respiratory:  Negative for cough and shortness of breath.   Cardiovascular:  Negative for chest pain.  Gastrointestinal:  Negative for abdominal pain, constipation, diarrhea, nausea and vomiting.  Genitourinary:  Negative for dysuria and urgency.  Musculoskeletal:  Positive for back pain. Negative for myalgias.  Neurological:  Positive for headaches. Negative for dizziness, weakness and light-headedness.  Psychiatric/Behavioral:  Negative for dysphoric mood. The patient is not nervous/anxious.  Current Outpatient Medications on File Prior to Visit  Medication Sig Dispense Refill   albuterol (VENTOLIN HFA) 108 (90 Base) MCG/ACT inhaler Inhale 2 puffs into the lungs every 6 (six) hours as needed for wheezing or shortness of breath. 54 each 3   ARIPiprazole (ABILIFY) 5 MG tablet Take 1 tablet (5 mg total) by mouth daily. 90 tablet 1   atorvastatin (LIPITOR) 40 MG tablet Take 1 tablet (40 mg total) by mouth daily. 100 tablet 2   Azelastine HCl 137 MCG/SPRAY SOLN Place 2 sprays into the nose in the morning and at bedtime. 90 mL 3   bethanechol (URECHOLINE) 25 MG tablet Take 1  tablet (25 mg total) by mouth in the morning and at bedtime. 180 tablet 1   busPIRone (BUSPAR) 30 MG tablet Take 1 tablet (30 mg total) by mouth 2 (two) times daily. 180 tablet 1   cetirizine (ZYRTEC) 10 MG tablet TAKE 1 TABLET BY MOUTH EVERYDAY AT BEDTIME 90 tablet 2   cyclobenzaprine (FLEXERIL) 10 MG tablet Take 1 tablet (10 mg total) by mouth 3 (three) times daily. 270 tablet 1   DULoxetine (CYMBALTA) 60 MG capsule Take 1 capsule (60 mg total) by mouth daily. 90 capsule 3   fenofibrate 160 MG tablet Take 1 tablet (160 mg total) by mouth daily. 90 tablet 0   fluticasone (FLONASE) 50 MCG/ACT nasal spray Place 1 spray into both nostrils daily. 48 mL 3   furosemide (LASIX) 20 MG tablet Take 1 tablet (20 mg total) by mouth daily. 90 tablet 0   gabapentin (NEURONTIN) 600 MG tablet Take 1 tablet (600 mg total) by mouth 4 (four) times daily. 400 tablet 2   hydrocortisone (CORTEF) 10 MG tablet Take 1 tablet (10 mg total) by mouth 2 (two) times daily. 30 tablet 0   hydrOXYzine (VISTARIL) 25 MG capsule Take 1 capsule (25 mg total) by mouth every 8 (eight) hours as needed for anxiety or itching. 270 capsule 0   levothyroxine (SYNTHROID) 75 MCG tablet Take 1 tablet (75 mcg total) by mouth daily before breakfast. 90 tablet 2   lubiprostone (AMITIZA) 8 MCG capsule Take 1 capsule (8 mcg total) by mouth daily with breakfast. 200 capsule 2   montelukast (SINGULAIR) 10 MG tablet Take 1 tablet (10 mg total) by mouth daily. 100 tablet 2   Multiple Vitamin (MULTI-VITAMINS) TABS Take 1 tablet by mouth daily.     oxyCODONE-acetaminophen (PERCOCET) 10-325 MG tablet Take 1 tablet by mouth every 8 (eight) hours as needed for pain. 30 tablet 0   pantoprazole (PROTONIX) 40 MG tablet Take 1 tablet (40 mg total) by mouth 2 (two) times daily. 180 tablet 1   primidone (MYSOLINE) 50 MG tablet Take 1 tablet (50 mg total) by mouth 2 (two) times daily. 180 tablet 1   promethazine-dextromethorphan (PROMETHAZINE-DM) 6.25-15 MG/5ML  syrup Take 5 mLs by mouth 4 (four) times daily as needed for cough. (Patient not taking: Reported on 08/30/2023) 180 mL 0   Propylene Glycol (SYSTANE BALANCE) 0.6 % SOLN Place 1 drop into both eyes daily as needed (dry eyes). 15 mL 3   Tiotropium Bromide Monohydrate (SPIRIVA RESPIMAT) 2.5 MCG/ACT AERS USE 1 INHALATION BY MOUTH  TWICE DAILY 12 g 3   torsemide (DEMADEX) 10 MG tablet Take 1 tablet (10 mg total) by mouth daily. 90 tablet 1   Vitamin D, Ergocalciferol, (DRISDOL) 1.25 MG (50000 UNIT) CAPS capsule Take 1 capsule (50,000 Units total) by mouth every Wednesday. 5 capsule 4   No  current facility-administered medications on file prior to visit.   Past Medical History:  Diagnosis Date   Adrenal insufficiency (HCC)    Anxiety    Arthritis    Carpal tunnel syndrome    Both hands, worse in right   Chronic constipation    COPD (chronic obstructive pulmonary disease) (HCC)    CVA (cerebral vascular accident) (HCC)    Depression    Facet syndrome    Fibromyalgia    GERD (gastroesophageal reflux disease)    Heart murmur    Hypercholesteremia    IBS (irritable bowel syndrome)    Ischemic stroke (HCC)    07/19/2004   Lumbar radiculopathy    Osteoporosis    Radiculopathy, lumbar region 05/02/2021   RLS (restless legs syndrome)    Spinal stenosis    TMJ (temporomandibular joint disorder)    Trochanteric bursitis of both hips    Vascular malformation    spinal cord   Past Surgical History:  Procedure Laterality Date   APPENDECTOMY     CARPAL TUNNEL RELEASE Right 12/13/2017   surgery   CATARACT EXTRACTION Bilateral    CHOLECYSTECTOMY     COLONOSCOPY  05/06/2017   Colonic polyps status post polypectomy. Interan land external hemorrhoids.    ESOPHAGOGASTRODUODENOSCOPY  04/28/2007   Mild gastrtiis. Otherwise normal EGD.    HEMORROIDECTOMY     LAMINECTOMY AND MICRODISCECTOMY LUMBAR SPINE  01/03/2005   NECK SURGERY     x2    OTHER SURGICAL HISTORY  04/24/2004   anterior cervical  discectomy and fusion at C5-C6   SPINAL CORD STIMULATOR INSERTION N/A 07/21/2021   Procedure: Permanant Spinal cord stimulator placement leads and battery under fluroscopy;  Surgeon: Renaldo Fiddler, MD;  Location: [facility] OR;  Service: Neurosurgery;  Laterality: N/A;   TONSILLECTOMY     TOTAL ABDOMINAL HYSTERECTOMY      Family History  Problem Relation Age of Onset   Supraventricular tachycardia Mother    Dementia Mother    Hyperlipidemia Mother    Hypertension Mother    Osteoarthritis Mother    Osteoporosis Mother    Breast cancer Other    Breast cancer Other    Colon cancer Neg Hx    Esophageal cancer Neg Hx    Social History   Socioeconomic History   Marital status: Divorced    Spouse name: Not on file   Number of children: 1   Years of education: Not on file   Highest education level: Not on file  Occupational History   Occupation: Disabled  Tobacco Use   Smoking status: Every Day    Current packs/day: 0.50    Types: Cigarettes   Smokeless tobacco: Never  Vaping Use   Vaping status: Never Used  Substance and Sexual Activity   Alcohol use: Not Currently    Comment: quit 19 years   Drug use: Never   Sexual activity: Not Currently  Other Topics Concern   Not on file  Social History Narrative   Disabled son lives with her sometimes and his father sometimes.  Tasmine is working hard to have a relationship with her son's father and his wife.  They help her when needed.   Social Drivers of Corporate investment banker Strain: Low Risk  (05/14/2023)   Overall Financial Resource Strain (CARDIA)    Difficulty of Paying Living Expenses: Not hard at all  Food Insecurity: No Food Insecurity (05/14/2023)   Hunger Vital Sign    Worried About Programme researcher, broadcasting/film/video in  the Last Year: Never true    Ran Out of Food in the Last Year: Never true  Transportation Needs: No Transportation Needs (05/14/2023)   PRAPARE - Administrator, Civil Service (Medical): No    Lack of  Transportation (Non-Medical): No  Physical Activity: Patient Declined (05/14/2023)   Exercise Vital Sign    Days of Exercise per Week: Patient declined    Minutes of Exercise per Session: Patient declined  Stress: Stress Concern Present (05/14/2023)   [employer] of Occupational Health - Occupational Stress Questionnaire    Feeling of Stress : Rather much  Social Connections: Socially Isolated (05/14/2023)   Social Connection and Isolation Panel [NHANES]    Frequency of Communication with Friends and Family: More than three times a week    Frequency of Social Gatherings with Friends and Family: More than three times a week    Attends Religious Services: Never    Database administrator or Organizations: No    Attends Engineer, structural: Never    Marital Status: Divorced    Objective:  BP 128/68   Pulse 98   Temp 98.2 F (36.8 C)   Ht 5' 1.5" (1.562 m)   Wt 147 lb (66.7 kg)   SpO2 100%   BMI 27.33 kg/m      08/30/2023    8:19 AM 06/05/2023    3:57 PM 05/17/2023    8:59 AM  BP/Weight  Systolic BP 128 116 110  Diastolic BP 68 70 60  Wt. (Lbs) 147 143 144  BMI 27.33 kg/m2 27.93 kg/m2 28.12 kg/m2    Physical Exam Vitals reviewed.  Constitutional:      Appearance: Normal appearance.  HENT:     Right Ear: Hearing normal. There is impacted cerumen. No foreign body.     Left Ear: Hearing normal. A middle ear effusion is present. No foreign body. Tympanic membrane is injected and bulging.     Nose:     Right Sinus: Maxillary sinus tenderness and frontal sinus tenderness present.     Left Sinus: Maxillary sinus tenderness and frontal sinus tenderness present.  Cardiovascular:     Rate and Rhythm: Normal rate and regular rhythm.     Heart sounds: Murmur heard.  Pulmonary:     Effort: Pulmonary effort is normal.     Breath sounds: Wheezing present.  Abdominal:     General: Bowel sounds are normal.     Palpations: Abdomen is soft.     Tenderness: There is no  abdominal tenderness.  Neurological:     Mental Status: She is alert and oriented to person, place, and time.  Psychiatric:        Mood and Affect: Mood normal.        Behavior: Behavior normal.     Diabetic Foot Exam - Simple   No data filed      Lab Results  Component Value Date   WBC 11.1 (H) 05/17/2023   HGB 14.8 05/17/2023   HCT 45.0 05/17/2023   PLT 463 (H) 05/17/2023   GLUCOSE 94 05/17/2023   CHOL 177 05/17/2023   TRIG 94 05/17/2023   HDL 88 05/17/2023   LDLCALC 72 05/17/2023   ALT 22 05/17/2023   AST 24 05/17/2023   NA 141 05/17/2023   K 4.4 05/17/2023   CL 99 05/17/2023   CREATININE 0.86 05/17/2023   BUN 12 05/17/2023   CO2 27 05/17/2023   TSH 1.310 05/17/2023  Assessment & Plan:   Mixed hyperlipidemia Assessment & Plan: Labs drawn today Continue taking Fenofibrate 160mg , Lipitor 40mg ,  as directed Will adjust treatment depending on results Lab Results  Component Value Date   LDLCALC 72 05/17/2023     Orders: -     Hemoglobin A1c -     Lipid panel  Acute non-recurrent frontal sinusitis Assessment & Plan: Acute sinusitis with headache, sinus pressure, and eye watering. Differential diagnosis includes COVID-19. Discussed secondary infections such as ear infections following viral sinus infections. - Perform COVID-19 test to rule out infection. - Prescribe azithromycin (Z-Pak) for sinus infection. - Monitor for secondary infections such as ear infections.  Orders: -     Azithromycin; 2 DAILY FOR FIRST DAY, THEN DECREASE TO ONE DAILY FOR 4 MORE DAYS.  Dispense: 6 tablet; Refill: 0 -     CBC with Differential/Platelet -     Comprehensive metabolic panel with GFR  Hyperparathyroidism (HCC) Assessment & Plan: Labs drawn today Continue taking Synthroid Will adjust treatment depending on results  Orders: -     TSH  Penetrating atherosclerotic ulcer of aorta (HCC) Assessment & Plan: Severe heart condition identified on MRCP,  awaiting cardiology evaluation. Referral to [facility] made. Discussed avoiding strenuous activities until further evaluation. - Contact [facility] to expedite appointment scheduling. - Advise her to avoid strenuous activities until further evaluation.   Irritable bowel syndrome with diarrhea Assessment & Plan: Chronic IBS with recent exacerbation. Symptoms improved with dicyclomine. Discussed chronic nature and need for ongoing management. - Continue dicyclomine for symptom management.   Encounter for screening mammogram for malignant neoplasm of breast -     Digital Screening Mammogram, Left and Right; Future     Meds ordered this encounter  Medications   azithromycin (ZITHROMAX) 250 MG tablet    Sig: 2 DAILY FOR FIRST DAY, THEN DECREASE TO ONE DAILY FOR 4 MORE DAYS.    Dispense:  6 tablet    Refill:  0    Orders Placed This Encounter  Procedures   MM DIGITAL SCREENING BILATERAL   CBC with Differential/Platelet   Comprehensive metabolic panel with GFR   Hemoglobin A1c   Lipid panel   TSH    Insomnia Chronic insomnia with nightmares and difficulty sleeping. Previous therapy attempts not helpful. Discussed potential impact of nightmares and further evaluation if symptoms persist. - Consider referral to a sleep specialist if symptoms persist.  General Health Maintenance Received three COVID-19 vaccinations. Discussed potential need for yearly COVID-19 vaccines due to virus mutation trends. - Discuss yearly COVID-19 vaccination during future visits.  Follow-up Follow-up plans discussed for pending cardiology appointment and lab results. She expressed concern about missing calls from [facility]. - Follow up on cardiology appointment scheduling with [facility]. - Perform lab tests during current visit. - Contact her with lab results and cardiology appointment details.        Follow-up: Return in about 3 months (around 11/29/2023) for  Chronic.   I,Katherina A Bramblett,acting as a scribe for [employer], PA.,have documented all relevant documentation on the behalf of Langley Gauss, PA,as directed by  Langley Gauss, PA while in the presence of Langley Gauss, [place].   An After Visit Summary was printed and given to the patient.  Langley Gauss, [place] [facility] [phone]

## 2023-08-30 NOTE — Assessment & Plan Note (Signed)
 Chronic IBS with recent exacerbation. Symptoms improved with dicyclomine. Discussed chronic nature and need for ongoing management. - Continue dicyclomine for symptom management.

## 2023-08-30 NOTE — Assessment & Plan Note (Signed)
 Severe heart condition identified on MRCP, awaiting cardiology evaluation. Referral to [facility] made. Discussed avoiding strenuous activities until further evaluation. - Contact [facility] to expedite appointment scheduling. - Advise her to avoid strenuous activities until further evaluation.

## 2023-08-30 NOTE — Assessment & Plan Note (Signed)
 Labs drawn today Continue taking Synthroid Will adjust treatment depending on results

## 2023-08-31 LAB — LIPID PANEL
Chol/HDL Ratio: 2 ratio (ref 0.0–4.4)
Cholesterol, Total: 162 mg/dL (ref 100–199)
HDL: 82 mg/dL (ref >39)
LDL Chol Calc (NIH): 58 mg/dL (ref 0–99)
Triglycerides: 133 mg/dL (ref 0–149)
VLDL Cholesterol Cal: 22 mg/dL (ref 5–40)

## 2023-08-31 LAB — COMPREHENSIVE METABOLIC PANEL WITH GFR
ALT: 16 IU/L (ref 0–32)
AST: 18 IU/L (ref 0–40)
Albumin: 4.4 g/dL (ref 3.9–4.9)
Alkaline Phosphatase: 49 IU/L (ref 44–121)
BUN/Creatinine Ratio: 14 (ref 12–28)
BUN: 12 mg/dL (ref 8–27)
Bilirubin Total: 0.3 mg/dL (ref 0.0–1.2)
CO2: 26 mmol/L (ref 20–29)
Calcium: 9.3 mg/dL (ref 8.7–10.3)
Chloride: 103 mmol/L (ref 96–106)
Creatinine, Ser: 0.83 mg/dL (ref 0.57–1.00)
Globulin, Total: 2.2 g/dL (ref 1.5–4.5)
Glucose: 102 mg/dL — ABNORMAL HIGH (ref 70–99)
Potassium: 4.1 mmol/L (ref 3.5–5.2)
Sodium: 143 mmol/L (ref 134–144)
Total Protein: 6.6 g/dL (ref 6.0–8.5)
eGFR: 76 mL/min/{1.73_m2} (ref >59)

## 2023-08-31 LAB — CBC WITH DIFFERENTIAL/PLATELET
Basophils Absolute: 0.1 10*3/uL (ref 0.0–0.2)
Basos: 1 %
EOS (ABSOLUTE): 0.1 10*3/uL (ref 0.0–0.4)
Eos: 1 %
Hematocrit: 44 % (ref 34.0–46.6)
Hemoglobin: 14.7 g/dL (ref 11.1–15.9)
Immature Grans (Abs): 0 10*3/uL (ref 0.0–0.1)
Immature Granulocytes: 0 %
Lymphocytes Absolute: 1.9 10*3/uL (ref 0.7–3.1)
Lymphs: 13 %
MCH: 30.5 pg (ref 26.6–33.0)
MCHC: 33.4 g/dL (ref 31.5–35.7)
MCV: 91 fL (ref 79–97)
Monocytes Absolute: 0.6 10*3/uL (ref 0.1–0.9)
Monocytes: 4 %
Neutrophils Absolute: 11.4 10*3/uL — ABNORMAL HIGH (ref 1.4–7.0)
Neutrophils: 81 %
Platelets: 493 10*3/uL — ABNORMAL HIGH (ref 150–450)
RBC: 4.82 x10E6/uL (ref 3.77–5.28)
RDW: 14.1 % (ref 11.7–15.4)
WBC: 14.1 10*3/uL — ABNORMAL HIGH (ref 3.4–10.8)

## 2023-08-31 LAB — HEMOGLOBIN A1C
Est. average glucose Bld gHb Est-mCnc: 120 mg/dL
Hgb A1c MFr Bld: 5.8 % — ABNORMAL HIGH (ref 4.8–5.6)

## 2023-08-31 LAB — TSH: TSH: 0.663 u[IU]/mL (ref 0.450–4.500)

## 2023-09-03 ENCOUNTER — Encounter (INDEPENDENT_AMBULATORY_CARE_PROVIDER_SITE_OTHER): Payer: Self-pay

## 2023-09-04 ENCOUNTER — Other Ambulatory Visit: Payer: Self-pay

## 2023-09-04 DIAGNOSIS — R899 Unspecified abnormal finding in specimens from other organs, systems and tissues: Secondary | ICD-10-CM

## 2023-09-05 ENCOUNTER — Other Ambulatory Visit

## 2023-09-05 ENCOUNTER — Telehealth: Payer: Self-pay | Admitting: Family Medicine

## 2023-09-05 ENCOUNTER — Encounter (INDEPENDENT_AMBULATORY_CARE_PROVIDER_SITE_OTHER): Payer: Self-pay

## 2023-09-05 ENCOUNTER — Other Ambulatory Visit: Payer: Self-pay | Admitting: Family Medicine

## 2023-09-05 DIAGNOSIS — R899 Unspecified abnormal finding in specimens from other organs, systems and tissues: Secondary | ICD-10-CM | POA: Diagnosis not present

## 2023-09-05 LAB — CBC WITH DIFFERENTIAL/PLATELET
Basophils Absolute: 0.1 10*3/uL (ref 0.0–0.2)
Basos: 1 %
EOS (ABSOLUTE): 0.2 10*3/uL (ref 0.0–0.4)
Eos: 2 %
Hematocrit: 41.3 % (ref 34.0–46.6)
Hemoglobin: 13.9 g/dL (ref 11.1–15.9)
Immature Grans (Abs): 0.1 10*3/uL (ref 0.0–0.1)
Immature Granulocytes: 1 %
Lymphocytes Absolute: 2 10*3/uL (ref 0.7–3.1)
Lymphs: 18 %
MCH: 31.1 pg (ref 26.6–33.0)
MCHC: 33.7 g/dL (ref 31.5–35.7)
MCV: 92 fL (ref 79–97)
Monocytes Absolute: 0.6 10*3/uL (ref 0.1–0.9)
Monocytes: 6 %
Neutrophils Absolute: 8.2 10*3/uL — ABNORMAL HIGH (ref 1.4–7.0)
Neutrophils: 72 %
Platelets: 467 10*3/uL — ABNORMAL HIGH (ref 150–450)
RBC: 4.47 x10E6/uL (ref 3.77–5.28)
RDW: 14.3 % (ref 11.7–15.4)
WBC: 11.3 10*3/uL — ABNORMAL HIGH (ref 3.4–10.8)

## 2023-09-05 NOTE — Telephone Encounter (Addendum)
 Left message with cardiovascular surgery at [facility] to discuss urgency of referral.  I requested a call back. Dr. Reinhold Carbine.   I received a call back on 4/10 late in the day and was told an APP would call me to discuss. I have not received a call. My personal cell phone was given. Dr. Reinhold Carbine

## 2023-09-09 ENCOUNTER — Other Ambulatory Visit: Payer: Self-pay | Admitting: Family Medicine

## 2023-09-09 ENCOUNTER — Ambulatory Visit: Payer: Self-pay

## 2023-09-09 DIAGNOSIS — I71019 Dissection of thoracic aorta, unspecified: Secondary | ICD-10-CM

## 2023-09-09 DIAGNOSIS — I719 Aortic aneurysm of unspecified site, without rupture: Secondary | ICD-10-CM

## 2023-09-09 NOTE — Telephone Encounter (Signed)
 Copied from CRM [id number]. Topic: Clinical - Red Word Triage >> Sep 09, 2023  3:50 PM Baldemar Lev wrote: Red Word that prompted transfer to Nurse Triage: Pt says she has anxiety about recent information for cardiologist Reason for Disposition . Health Information question, no triage required and triager able to answer question  Answer Assessment - Initial Assessment Questions 1. REASON FOR CALL or QUESTION: "What is your reason for calling today?" or "How can I best help you?" or "What question do you have that I can help answer?"     -[facility] called and stated wanted to do a CT scan- pt told them she has already had this done x2 - then they said they will call her back and see has not heard anything from them.  Since then pt has received information on tests results through mail and is concerned.  Pt is very confused about what is going on with her health and her plan of care. Stomach doctor stated pt needs to talk to PCP  regarding plan of care - he does not plan on continuing anything for now.  Pt recevied MRI results that stated severe arthrosclerosis noted for penetrating ulceration or discretion of aorta.  Pt does not understand results - states it seems fatal and scary wants a call back asap and someone to go over plan of care. Pt would also like to get update on blood count because blood results were high  Protocols used: Information Only Call - No Triage-A-AH

## 2023-09-09 NOTE — Telephone Encounter (Signed)
 I called Dr. Cherylene Corrente office and left another message asking for a return call. Dr. Reinhold Carbine

## 2023-09-09 NOTE — Telephone Encounter (Signed)
 Please call Kim and let her know I have left multiple messages for vascular surgery and have yet to have them return my calls.I did speak with the scheduler or nurse at [facility] and she said you needed a cta of chest, abd, and pelvis with contrast to assess the aorta. The other scans you have had were not to assess the aorta and did not have contrast, even the ct scans done in the last 3 months.  I have ordered this to do stat on Tuesday if possible, since [facility] cannot get it scheduled until 09/30/23.  I will get these results and better assess the issue and also try again to speak with the vascular surgeons.  Dr. Reinhold Carbine

## 2023-09-09 NOTE — Telephone Encounter (Signed)
 Pt called back to check status of request, says she is anxious. Transferred to NT.

## 2023-09-10 ENCOUNTER — Encounter: Payer: Self-pay | Admitting: Family Medicine

## 2023-09-10 ENCOUNTER — Ambulatory Visit (HOSPITAL_COMMUNITY)
Admission: RE | Admit: 2023-09-10 | Discharge: 2023-09-10 | Disposition: A | Discharge status: Home / Self Care | Source: Ambulatory Visit | Attending: Family Medicine | Admitting: Family Medicine

## 2023-09-10 DIAGNOSIS — I71019 Dissection of thoracic aorta, unspecified: Secondary | ICD-10-CM | POA: Insufficient documentation

## 2023-09-10 DIAGNOSIS — I719 Aortic aneurysm of unspecified site, without rupture: Secondary | ICD-10-CM | POA: Diagnosis not present

## 2023-09-10 DIAGNOSIS — K7689 Other specified diseases of liver: Secondary | ICD-10-CM | POA: Diagnosis not present

## 2023-09-10 DIAGNOSIS — I708 Atherosclerosis of other arteries: Secondary | ICD-10-CM | POA: Diagnosis not present

## 2023-09-10 DIAGNOSIS — K5730 Diverticulosis of large intestine without perforation or abscess without bleeding: Secondary | ICD-10-CM | POA: Diagnosis not present

## 2023-09-10 MED ORDER — IOHEXOL 350 MG/ML SOLN
75.0000 mL | Freq: Once | INTRAVENOUS | Status: AC | PRN
  Administered 2023-09-10: 75 mL via INTRAVENOUS

## 2023-09-10 NOTE — Telephone Encounter (Signed)
 Patient aware and verbalized understanding. Aware to be expecting a phone call from [facility] referral dept.

## 2023-09-10 NOTE — Telephone Encounter (Signed)
 Patient aware.

## 2023-09-11 ENCOUNTER — Other Ambulatory Visit: Payer: Self-pay | Admitting: Family Medicine

## 2023-09-11 ENCOUNTER — Other Ambulatory Visit: Payer: Self-pay

## 2023-09-11 DIAGNOSIS — I719 Aortic aneurysm of unspecified site, without rupture: Secondary | ICD-10-CM

## 2023-09-11 DIAGNOSIS — I700 Atherosclerosis of aorta: Secondary | ICD-10-CM

## 2023-09-11 MED ORDER — ATORVASTATIN CALCIUM 80 MG PO TABS: 80.0000 mg | ORAL_TABLET | Freq: Every day | ORAL | 0 refills | Status: DC

## 2023-09-11 NOTE — Telephone Encounter (Signed)
 I spoke with vascular surgeon on call last night who reviewed the cta of chest, abd, pelvis and recommended she be seen for repeat cta in 6 months. He did not believe any findings warranted intervention/surgery at this time. I requested she have an appointment over the next few weeks so they could review results and plan with [facility] He agreed increasing lipitor to 80 mg daily and attempting to take aspirin 81 mg daily would be appropriate. Patient has history of stomach ulcers and did not tolerate aspirin previously. Patient does not remember what dose or how much she took when she had the ulcer. I requested she try the aspirin 81 mg daily with food.   I spoke with Carmelina Chinchilla last night and Katie, my nurse, spoke with her this morning.   Cancel all testing and vascular appointments with [facility] as I still have not received any calls back from the department and was unsuccessful getting through the [facility] operators to speak with the on call vascular surgeon. Furthermore, when we spoke with Carmelina Chinchilla, she had not requested [facility] she said and would in fact prefer to go to [facility]. Dr. Reinhold Carbine

## 2023-09-12 NOTE — Progress Notes (Signed)
 Patient ID: Connie Coffey, female   DOB: [birth year], 68 y.o.   MRN: [id number]  Reason for Consult: Follow-up   Referred by Mercy Stall, MD  Subjective:     HPI  Connie Coffey is a 68 y.o. female presenting for evaluation of incidentally found thoracic aortic PAU's.  She has chronic back pain but denies any change in character or severity of her back pain.  She denies any chest pain.  She denies claudication, rest pain or tissue loss.  She is a current smoker.  Past Medical History:  Diagnosis Date   Adrenal insufficiency (HCC)    Anxiety    Arthritis    Carpal tunnel syndrome    Both hands, worse in right   Chronic constipation    COPD (chronic obstructive pulmonary disease) (HCC)    CVA (cerebral vascular accident) (HCC)    Depression    Facet syndrome    Fibromyalgia    GERD (gastroesophageal reflux disease)    Heart murmur    Hypercholesteremia    IBS (irritable bowel syndrome)    Ischemic stroke (HCC)    07/19/2004   Lumbar radiculopathy    Osteoporosis    Radiculopathy, lumbar region 05/02/2021   RLS (restless legs syndrome)    Spinal stenosis    Thyroid  disease    TMJ (temporomandibular joint disorder)    Trochanteric bursitis of both hips    Vascular malformation    spinal cord   Family History  Problem Relation Age of Onset   Supraventricular tachycardia Mother    Dementia Mother    Hyperlipidemia Mother    Hypertension Mother    Osteoarthritis Mother    Osteoporosis Mother    Breast cancer Other    Breast cancer Other    Colon cancer Neg Hx    Esophageal cancer Neg Hx    Past Surgical History:  Procedure Laterality Date   APPENDECTOMY     CARPAL TUNNEL RELEASE Right 12/13/2017   surgery   CATARACT EXTRACTION Bilateral    CHOLECYSTECTOMY     COLONOSCOPY  05/06/2017   Colonic polyps status post polypectomy. Interan land external hemorrhoids.    ESOPHAGOGASTRODUODENOSCOPY  04/28/2007   Mild gastrtiis. Otherwise normal EGD.    HEMORROIDECTOMY      LAMINECTOMY AND MICRODISCECTOMY LUMBAR SPINE  01/03/2005   NECK SURGERY     x2    OTHER SURGICAL HISTORY  04/24/2004   anterior cervical discectomy and fusion at C5-C6   SPINAL CORD STIMULATOR INSERTION N/A 07/21/2021   Procedure: Permanant Spinal cord stimulator placement leads and battery under fluroscopy;  Surgeon: Annis Kinder, MD;  Location: [facility] OR;  Service: Neurosurgery;  Laterality: N/A;   TONSILLECTOMY     TOTAL ABDOMINAL HYSTERECTOMY      Short Social History:  Social History   Tobacco Use   Smoking status: Every Day    Current packs/day: 0.50    Types: Cigarettes   Smokeless tobacco: Never  Substance Use Topics   Alcohol use: Not Currently    Comment: quit 19 years    Allergies  Allergen Reactions   Beta Vulgaris Anaphylaxis    Beets    Penicillins Shortness Of Breath   Celecoxib Other (See Comments)    "because of stomach ulcers"   Chantix [Varenicline]     Makes pt sick on her stomach    Dog Epithelium (Canis Lupus Familiaris)     Cats and dogs   Fish Oil Other (See Comments)    hemorrhoids  Gramineae Pollens    Molds & Smuts    Nsaids Other (See Comments)    GI Upset. Ulcers   Other Other (See Comments)    beets  Including Celebrex   Tape Dermatitis    Electrodes caused blistering   Nicotine Nausea Only and Other (See Comments)    Current Outpatient Medications  Medication Sig Dispense Refill   albuterol  (VENTOLIN  HFA) 108 (90 Base) MCG/ACT inhaler Inhale 2 puffs into the lungs every 6 (six) hours as needed for wheezing or shortness of breath. 54 each 3   ARIPiprazole  (ABILIFY ) 5 MG tablet Take 1 tablet (5 mg total) by mouth daily. 90 tablet 1   aspirin EC 81 MG tablet Take 81 mg by mouth daily. Swallow whole.     atorvastatin  (LIPITOR) 80 MG tablet Take 1 tablet (80 mg total) by mouth daily. 90 tablet 0   Azelastine  HCl 137 MCG/SPRAY SOLN Place 2 sprays into the nose in the morning and at bedtime. 90 mL 3   azithromycin  (ZITHROMAX ) 250 MG  tablet 2 DAILY FOR FIRST DAY, THEN DECREASE TO ONE DAILY FOR 4 MORE DAYS. 6 tablet 0   bethanechol  (URECHOLINE ) 25 MG tablet Take 1 tablet (25 mg total) by mouth in the morning and at bedtime. 180 tablet 1   busPIRone  (BUSPAR ) 30 MG tablet Take 1 tablet (30 mg total) by mouth 2 (two) times daily. 180 tablet 1   cetirizine  (ZYRTEC ) 10 MG tablet TAKE 1 TABLET BY MOUTH EVERYDAY AT BEDTIME 90 tablet 2   cyclobenzaprine  (FLEXERIL ) 10 MG tablet Take 1 tablet (10 mg total) by mouth 3 (three) times daily. 270 tablet 1   dicyclomine (BENTYL) 20 MG tablet Take 20 mg by mouth 4 (four) times daily.     DULoxetine  (CYMBALTA ) 60 MG capsule Take 1 capsule (60 mg total) by mouth daily. 90 capsule 3   fenofibrate  160 MG tablet Take 1 tablet (160 mg total) by mouth daily. 90 tablet 0   fluticasone  (FLONASE ) 50 MCG/ACT nasal spray Place 1 spray into both nostrils daily. 48 mL 3   furosemide  (LASIX ) 20 MG tablet Take 1 tablet (20 mg total) by mouth daily. 90 tablet 0   gabapentin  (NEURONTIN ) 600 MG tablet Take 1 tablet (600 mg total) by mouth 4 (four) times daily. 400 tablet 2   hydrocortisone  (CORTEF ) 10 MG tablet Take 1 tablet (10 mg total) by mouth 2 (two) times daily. 30 tablet 0   hydrOXYzine  (VISTARIL ) 25 MG capsule Take 1 capsule (25 mg total) by mouth every 8 (eight) hours as needed for anxiety or itching. 270 capsule 0   levothyroxine  (SYNTHROID ) 75 MCG tablet Take 1 tablet (75 mcg total) by mouth daily before breakfast. 90 tablet 2   lubiprostone  (AMITIZA ) 8 MCG capsule Take 1 capsule (8 mcg total) by mouth daily with breakfast. 200 capsule 2   montelukast  (SINGULAIR ) 10 MG tablet Take 1 tablet (10 mg total) by mouth daily. 100 tablet 2   Multiple Vitamin (MULTI-VITAMINS) TABS Take 1 tablet by mouth daily.     oxyCODONE -acetaminophen  (PERCOCET) 10-325 MG tablet Take 1 tablet by mouth every 8 (eight) hours as needed for pain. 30 tablet 0   pantoprazole  (PROTONIX ) 40 MG tablet Take 1 tablet (40 mg total) by  mouth 2 (two) times daily. 180 tablet 1   primidone  (MYSOLINE ) 50 MG tablet Take 1 tablet (50 mg total) by mouth 2 (two) times daily. 180 tablet 1   promethazine -dextromethorphan (PROMETHAZINE -DM) 6.25-15 MG/5ML syrup Take 5 mLs  by mouth 4 (four) times daily as needed for cough. 180 mL 0   Propylene Glycol (SYSTANE BALANCE) 0.6 % SOLN Place 1 drop into both eyes daily as needed (dry eyes). 15 mL 3   Tiotropium Bromide Monohydrate  (SPIRIVA  RESPIMAT) 2.5 MCG/ACT AERS USE 1 INHALATION BY MOUTH  TWICE DAILY 12 g 3   torsemide  (DEMADEX ) 10 MG tablet Take 1 tablet (10 mg total) by mouth daily. 90 tablet 1   Vitamin D , Ergocalciferol , (DRISDOL ) 1.25 MG (50000 UNIT) CAPS capsule Take 1 capsule (50,000 Units total) by mouth every Wednesday. 5 capsule 4   No current facility-administered medications for this visit.    REVIEW OF SYSTEMS   All other systems were reviewed and are negative     Objective:  Objective   Vitals:   09/13/23 1144  BP: 123/72  Pulse: 84  Resp: 20  Temp: 98.1 F (36.7 C)  TempSrc: Temporal  SpO2: 91%  Weight: 141 lb 9.6 oz (64.2 kg)  Height: 5' 1.5" (1.562 m)   Body mass index is 26.32 kg/m.  Physical Exam General: no acute distress Cardiac: hemodynamically stable Pulm: normal work of breathing Abdomen: non-tender, no pulsatile mass Neuro: alert, no focal deficit Extremities: no edema, cyanosis or wounds Vascular:   Right: Palpable femoral, DP, PT, radial  Left: Palpable femoral, DP, PT, radial   Data: CTA independently reviewed Atherosclerotic plaque throughout the thoracic aorta with multifocal small penetrating aortic ulcers. No significant stenosis in the visceral segment. 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 female with thoracic aortic PAU's.  I explained that they are all small and do not meet the size criteria of greater than 2 cm to warrant intervention at this time.  I also  explained that these are very slow-growing and we will continue with annual surveillance.  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  I explained that there is no indication for treatment of this as well.  Will plan for follow up in 1 year with repeat CTA    Recommendations to optimize cardiovascular risk: Abstinence from all tobacco products. Blood glucose control with goal A1c < 7%. Blood pressure control with goal blood pressure < 140/90 mmHg. Lipid reduction therapy with goal LDL-C <100 mg/dL  Aspirin 81mg  PO QD.  Atorvastatin  40-80mg  PO QD (or other "high intensity" statin therapy).     Philipp Brawn MD [facility]

## 2023-09-13 ENCOUNTER — Encounter: Payer: Self-pay | Admitting: Vascular Surgery

## 2023-09-13 ENCOUNTER — Ambulatory Visit (INDEPENDENT_AMBULATORY_CARE_PROVIDER_SITE_OTHER): Admitting: Vascular Surgery

## 2023-09-13 VITALS — BP 123/72 | HR 84 | Temp 98.1°F | Resp 20 | Ht 61.5 in | Wt 141.6 lb

## 2023-09-13 DIAGNOSIS — I719 Aortic aneurysm of unspecified site, without rupture: Secondary | ICD-10-CM

## 2023-09-18 ENCOUNTER — Ambulatory Visit
Admission: RE | Admit: 2023-09-18 | Discharge: 2023-09-18 | Disposition: A | Discharge status: Home / Self Care | Source: Ambulatory Visit | Attending: Physician Assistant | Admitting: Physician Assistant

## 2023-09-18 DIAGNOSIS — Z1231 Encounter for screening mammogram for malignant neoplasm of breast: Secondary | ICD-10-CM | POA: Diagnosis not present

## 2023-09-20 ENCOUNTER — Encounter (INDEPENDENT_AMBULATORY_CARE_PROVIDER_SITE_OTHER): Payer: Self-pay | Admitting: Otolaryngology

## 2023-09-23 ENCOUNTER — Encounter: Payer: Self-pay | Admitting: Family Medicine

## 2023-09-30 ENCOUNTER — Ambulatory Visit: Payer: Self-pay

## 2023-09-30 DIAGNOSIS — M961 Postlaminectomy syndrome, not elsewhere classified: Secondary | ICD-10-CM | POA: Diagnosis not present

## 2023-09-30 DIAGNOSIS — Z9689 Presence of other specified functional implants: Secondary | ICD-10-CM | POA: Diagnosis not present

## 2023-09-30 DIAGNOSIS — M5416 Radiculopathy, lumbar region: Secondary | ICD-10-CM | POA: Diagnosis not present

## 2023-09-30 NOTE — Telephone Encounter (Signed)
 Agree. Dr. Sedalia Muta

## 2023-09-30 NOTE — Telephone Encounter (Signed)
  Chief Complaint: Blood Pressure Inquiry  Symptoms: None at this time  Frequency: One time today  Pertinent Negatives: Patient denies dizziness, weakness, numbness, tingling.  Disposition: [] ED /[] Urgent Care (no appt availability in office) / [] Appointment(In office/virtual)/ []  [facility]/ [] Home Care/ [] Refused Recommended Disposition /[] [facility]/ [x]  Follow-up with PCP  Additional Notes: CL is being triaged for a low blood pressure, 91/60. Once taken at home 121/72, 122/71. The patient denies any abnormality at this time. The patient states she does not feel good, but otherwise Is nonsymptomatic. Instructed patient to continue to monitor the blood pressure, and seek care if symptoms worsen or the readings are abnormal.

## 2023-10-01 DIAGNOSIS — N390 Urinary tract infection, site not specified: Secondary | ICD-10-CM | POA: Diagnosis not present

## 2023-10-01 DIAGNOSIS — R339 Retention of urine, unspecified: Secondary | ICD-10-CM | POA: Diagnosis not present

## 2023-10-20 ENCOUNTER — Other Ambulatory Visit: Payer: Self-pay | Admitting: Family Medicine

## 2023-10-20 DIAGNOSIS — R600 Localized edema: Secondary | ICD-10-CM

## 2023-10-21 ENCOUNTER — Other Ambulatory Visit: Payer: Self-pay | Admitting: Family Medicine

## 2023-10-30 ENCOUNTER — Other Ambulatory Visit: Payer: Self-pay | Admitting: Family Medicine

## 2023-10-30 ENCOUNTER — Ambulatory Visit: Payer: Self-pay

## 2023-10-30 NOTE — Telephone Encounter (Signed)
 FYI Only or Action Required?: FYI only for provider  Patient was last seen in primary care on 09/23/23. Called Nurse Triage reporting Foot Swelling. Symptoms began several months ago. Interventions attempted: Other: compression stocking, elevating feet. Symptoms are: gradually worsening.  Triage Disposition: See Physician Within 24 Hours  Patient/caregiver understands and will follow disposition?: Yes

## 2023-10-30 NOTE — Progress Notes (Signed)
 Acute Office Visit  Subjective:    Patient ID: Connie Coffey, female    DOB: 1955-06-16, 68 y.o.   MRN: [id number]  Chief Complaint  Patient presents with   Edema    HPI: Patient is in today for BL lower edema. States she only needs to know which dose she should be taking as she has both torsemide  10mg  and furosemide  20mg  doses. She has been taking furosemide  20mg  daily.  Past Medical History:  Diagnosis Date   Adrenal insufficiency (HCC)    Anxiety    Arthritis    Carpal tunnel syndrome    Both hands, worse in right   Chronic constipation    COPD (chronic obstructive pulmonary disease) (HCC)    CVA (cerebral vascular accident) (HCC)    Depression    Facet syndrome    Fibromyalgia    GERD (gastroesophageal reflux disease)    Heart murmur    Hypercholesteremia    IBS (irritable bowel syndrome)    Ischemic stroke (HCC)    07/19/2004   Lumbar radiculopathy    Osteoporosis    Radiculopathy, lumbar region 05/02/2021   RLS (restless legs syndrome)    Spinal stenosis    Thyroid  disease    TMJ (temporomandibular joint disorder)    Trochanteric bursitis of both hips    Vascular malformation    spinal cord    Past Surgical History:  Procedure Laterality Date   APPENDECTOMY     CARPAL TUNNEL RELEASE Right 12/13/2017   surgery   CATARACT EXTRACTION Bilateral    CHOLECYSTECTOMY     COLONOSCOPY  05/06/2017   Colonic polyps status post polypectomy. Interan land external hemorrhoids.    ESOPHAGOGASTRODUODENOSCOPY  04/28/2007   Mild gastrtiis. Otherwise normal EGD.    HEMORROIDECTOMY     LAMINECTOMY AND MICRODISCECTOMY LUMBAR SPINE  01/03/2005   NECK SURGERY     x2    OTHER SURGICAL HISTORY  04/24/2004   anterior cervical discectomy and fusion at C5-C6   SPINAL CORD STIMULATOR INSERTION N/A 07/21/2021   Procedure: Permanant Spinal cord stimulator placement leads and battery under fluroscopy;  Surgeon: Annis Kinder, MD;  Location: [facility] OR;  Service: Neurosurgery;   Laterality: N/A;   TONSILLECTOMY     TOTAL ABDOMINAL HYSTERECTOMY      Family History  Problem Relation Age of Onset   Supraventricular tachycardia Mother    Dementia Mother    Hyperlipidemia Mother    Hypertension Mother    Osteoarthritis Mother    Osteoporosis Mother    Breast cancer Other    Breast cancer Other    Colon cancer Neg Hx    Esophageal cancer Neg Hx     Social History   Socioeconomic History   Marital status: Divorced    Spouse name: Not on file   Number of children: 1   Years of education: Not on file   Highest education level: Not on file  Occupational History   Occupation: Disabled  Tobacco Use   Smoking status: Every Day    Current packs/day: 0.50    Types: Cigarettes   Smokeless tobacco: Never  Vaping Use   Vaping status: Never Used  Substance and Sexual Activity   Alcohol use: Not Currently    Comment: quit 19 years   Drug use: Never   Sexual activity: Not Currently  Other Topics Concern   Not on file  Social History Narrative   Disabled son lives with her sometimes and his father sometimes.  Launi is  working hard to have a relationship with her son's father and his wife.  They help her when needed.   Social Drivers of Corporate investment banker Strain: Low Risk  (05/14/2023)   Overall Financial Resource Strain (CARDIA)    Difficulty of Paying Living Expenses: Not hard at all  Food Insecurity: No Food Insecurity (05/14/2023)   Hunger Vital Sign    Worried About Running Out of Food in the Last Year: Never true    Ran Out of Food in the Last Year: Never true  Transportation Needs: No Transportation Needs (05/14/2023)   PRAPARE - Administrator, Civil Service (Medical): No    Lack of Transportation (Non-Medical): No  Physical Activity: Inactive (10/31/2023)   Exercise Vital Sign    Days of Exercise per Week: 0 days    Minutes of Exercise per Session: 0 min  Stress: Stress Concern Present (05/14/2023)   [employer] of  Occupational Health - Occupational Stress Questionnaire    Feeling of Stress : Rather much  Social Connections: Socially Isolated (05/14/2023)   Social Connection and Isolation Panel [NHANES]    Frequency of Communication with Friends and Family: More than three times a week    Frequency of Social Gatherings with Friends and Family: More than three times a week    Attends Religious Services: Never    Database administrator or Organizations: No    Attends Banker Meetings: Never    Marital Status: Divorced  Catering manager Violence: Not At Risk (05/14/2023)   Humiliation, Afraid, Rape, and Kick questionnaire    Fear of Current or Ex-Partner: No    Emotionally Abused: No    Physically Abused: No    Sexually Abused: No    Outpatient Medications Prior to Visit  Medication Sig Dispense Refill   albuterol  (VENTOLIN  HFA) 108 (90 Base) MCG/ACT inhaler Inhale 2 puffs into the lungs every 6 (six) hours as needed for wheezing or shortness of breath. 54 each 3   ARIPiprazole  (ABILIFY ) 5 MG tablet Take 1 tablet (5 mg total) by mouth daily. 90 tablet 1   aspirin EC 81 MG tablet Take 81 mg by mouth daily. Swallow whole.     atorvastatin  (LIPITOR) 80 MG tablet Take 1 tablet (80 mg total) by mouth daily. 90 tablet 0   Azelastine  HCl 137 MCG/SPRAY SOLN Place 2 sprays into the nose in the morning and at bedtime. 90 mL 3   bethanechol  (URECHOLINE ) 25 MG tablet Take 1 tablet (25 mg total) by mouth in the morning and at bedtime. 180 tablet 1   busPIRone  (BUSPAR ) 30 MG tablet TAKE 1 TABLET BY MOUTH TWICE A DAY 180 tablet 0   cetirizine  (ZYRTEC ) 10 MG tablet TAKE 1 TABLET BY MOUTH EVERYDAY AT BEDTIME 90 tablet 2   cyclobenzaprine  (FLEXERIL ) 10 MG tablet Take 1 tablet (10 mg total) by mouth 3 (three) times daily. 270 tablet 1   dicyclomine (BENTYL) 20 MG tablet Take 20 mg by mouth 4 (four) times daily.     DULoxetine  (CYMBALTA ) 60 MG capsule Take 1 capsule (60 mg total) by mouth daily. 90 capsule  3   fenofibrate  160 MG tablet Take 1 tablet (160 mg total) by mouth daily. 90 tablet 0   fluticasone  (FLONASE ) 50 MCG/ACT nasal spray Place 1 spray into both nostrils daily. 48 mL 3   gabapentin  (NEURONTIN ) 600 MG tablet Take 1 tablet (600 mg total) by mouth 4 (four) times daily. 400 tablet 2  hydrocortisone  (CORTEF ) 10 MG tablet Take 1 tablet (10 mg total) by mouth 2 (two) times daily. 30 tablet 0   hydrOXYzine  (VISTARIL ) 25 MG capsule TAKE 1 CAPSULE (25 MG TOTAL) BY MOUTH EVERY 8 (EIGHT) HOURS AS NEEDED FOR ANXIETY OR ITCHING. 270 capsule 0   levothyroxine  (SYNTHROID ) 75 MCG tablet Take 1 tablet (75 mcg total) by mouth daily before breakfast. 90 tablet 2   lubiprostone  (AMITIZA ) 8 MCG capsule Take 1 capsule (8 mcg total) by mouth daily with breakfast. 200 capsule 2   montelukast  (SINGULAIR ) 10 MG tablet Take 1 tablet (10 mg total) by mouth daily. 100 tablet 2   Multiple Vitamin (MULTI-VITAMINS) TABS Take 1 tablet by mouth daily.     oxyCODONE -acetaminophen  (PERCOCET) 10-325 MG tablet Take 1 tablet by mouth every 8 (eight) hours as needed for pain. 30 tablet 0   pantoprazole  (PROTONIX ) 40 MG tablet Take 1 tablet (40 mg total) by mouth 2 (two) times daily. 180 tablet 1   primidone  (MYSOLINE ) 50 MG tablet Take 1 tablet (50 mg total) by mouth 2 (two) times daily. 180 tablet 1   Propylene Glycol (SYSTANE BALANCE) 0.6 % SOLN Place 1 drop into both eyes daily as needed (dry eyes). 15 mL 3   Tiotropium Bromide Monohydrate  (SPIRIVA  RESPIMAT) 2.5 MCG/ACT AERS USE 1 INHALATION BY MOUTH  TWICE DAILY 12 g 3   Vitamin D , Ergocalciferol , (DRISDOL ) 1.25 MG (50000 UNIT) CAPS capsule Take 1 capsule (50,000 Units total) by mouth every Wednesday. 5 capsule 4   azithromycin  (ZITHROMAX ) 250 MG tablet 2 DAILY FOR FIRST DAY, THEN DECREASE TO ONE DAILY FOR 4 MORE DAYS. 6 tablet 0   furosemide  (LASIX ) 20 MG tablet Take 1 tablet (20 mg total) by mouth daily. 90 tablet 0   promethazine -dextromethorphan (PROMETHAZINE -DM)  6.25-15 MG/5ML syrup Take 5 mLs by mouth 4 (four) times daily as needed for cough. 180 mL 0   torsemide  (DEMADEX ) 10 MG tablet Take 1 tablet (10 mg total) by mouth daily. (Patient not taking: Reported on 10/31/2023) 90 tablet 1   No facility-administered medications prior to visit.    Allergies  Allergen Reactions   Beta Vulgaris Anaphylaxis    Beets    Penicillins Shortness Of Breath   Celecoxib Other (See Comments)    because of stomach ulcers   Chantix [Varenicline]     Makes pt sick on her stomach    Dog Epithelium (Canis Lupus Familiaris)     Cats and dogs   Fish Oil Other (See Comments)    hemorrhoids   Gramineae Pollens    Molds & Smuts    Nsaids Other (See Comments)    GI Upset. Ulcers   Other Other (See Comments)    beets  Including Celebrex   Tape Dermatitis    Electrodes caused blistering   Nicotine Nausea Only and Other (See Comments)    Review of Systems  Constitutional:  Negative for chills, fatigue and fever.  HENT:  Negative for congestion, ear pain, rhinorrhea and sore throat.   Respiratory:  Negative for cough and shortness of breath.   Cardiovascular:  Positive for leg swelling. Negative for chest pain.  Gastrointestinal:  Negative for abdominal pain, constipation, diarrhea, nausea and vomiting.  Genitourinary:  Negative for dysuria and urgency.  Musculoskeletal:  Negative for back pain and myalgias.  Neurological:  Negative for dizziness, weakness, light-headedness and headaches.  Psychiatric/Behavioral:  Negative for dysphoric mood. The patient is not nervous/anxious.        Objective:  10/31/2023   10:36 AM 09/13/2023   11:44 AM 08/30/2023    8:19 AM  Vitals with BMI  Height 5' 1.5 5' 1.5 5' 1.5  Weight 142 lbs 141 lbs 10 oz 147 lbs  BMI 26.4 26.33 27.33  Systolic 110 123 409  Diastolic 64 72 68  Pulse 98 84 98    No data found.   Physical Exam Vitals reviewed.  Constitutional:      Appearance: Normal appearance.   Cardiovascular:     Rate and Rhythm: Normal rate and regular rhythm.  Pulmonary:     Effort: Pulmonary effort is normal.     Breath sounds: Normal breath sounds.  Musculoskeletal:     Right lower leg: Edema present.     Left lower leg: No edema.  Neurological:     Mental Status: She is alert.     Health Maintenance Due  Topic Date Due   Zoster Vaccines- Shingrix (1 of 2) 05/04/2004   COVID-19 Vaccine (5 - 2024-25 season) 01/27/2023    There are no preventive care reminders to display for this patient.   Lab Results  Component Value Date   TSH 0.663 08/30/2023   Lab Results  Component Value Date   WBC 11.3 (H) 09/05/2023   HGB 13.9 09/05/2023   HCT 41.3 09/05/2023   MCV 92 09/05/2023   PLT 467 (H) 09/05/2023   Lab Results  Component Value Date   NA 143 08/30/2023   K 4.1 08/30/2023   CO2 26 08/30/2023   GLUCOSE 102 (H) 08/30/2023   BUN 12 08/30/2023   CREATININE 0.83 08/30/2023   BILITOT 0.3 08/30/2023   ALKPHOS 49 08/30/2023   AST 18 08/30/2023   ALT 16 08/30/2023   PROT 6.6 08/30/2023   ALBUMIN 4.4 08/30/2023   CALCIUM  9.3 08/30/2023   ANIONGAP 8 07/21/2021   EGFR 76 08/30/2023   Lab Results  Component Value Date   CHOL 162 08/30/2023   Lab Results  Component Value Date   HDL 82 08/30/2023   Lab Results  Component Value Date   LDLCALC 58 08/30/2023   Lab Results  Component Value Date   TRIG 133 08/30/2023   Lab Results  Component Value Date   CHOLHDL 2.0 08/30/2023   Lab Results  Component Value Date   HGBA1C 5.8 (H) 08/30/2023       Assessment & Plan:  Bilateral lower extremity edema Assessment & Plan: Stop furosemide . Start torsemide  20 mg daily.   Orders: -     Torsemide ; Take 1 tablet (20 mg total) by mouth daily.  Dispense: 30 tablet; Refill: 2     Meds ordered this encounter  Medications   torsemide  (DEMADEX ) 20 MG tablet    Sig: Take 1 tablet (20 mg total) by mouth daily.    Dispense:  30 tablet    Refill:  2     No orders of the defined types were placed in this encounter.    Follow-up: Return in about 3 months (around 01/31/2024) for chronic fasting.  An After Visit Summary was printed and given to the patient.  Connie Stall, MD [facility] [phone]

## 2023-10-30 NOTE — Telephone Encounter (Signed)
 Copied from CRM [id number]. Topic: Clinical - Red Word Triage >> Oct 30, 2023  9:00 AM Elle L wrote: Red Word that prompted transfer to Nurse Triage: The patient called to see if Torsemide  (DEMADEX ) 10 MG tablet  and furosemide  (LASIX ) 20 MG tablet were the same medication or not as they sound similar and she Googled them and they are both for water retention. However, she advised me that she is currently experiencing feet swelling. Reason for Disposition  [1] MODERATE leg swelling (e.g., swelling extends up to knees) AND [2] new-onset or worsening  Answer Assessment - Initial Assessment Questions 1. ONSET: "When did the swelling start?" (e.g., minutes, hours, days)     Bilateral feet swollen for months 2. LOCATION: "What part of the leg is swollen?"  "Are both legs swollen or just one leg?"     Ankles and feet, right is worse than left 3. SEVERITY: "How bad is the swelling?" (e.g., localized; mild, moderate, severe)   - Localized: Small area of swelling localized to one leg.   - MILD pedal edema: Swelling limited to foot and ankle, pitting edema < 1/4 inch (6 mm) deep, rest and elevation eliminate most or all swelling.   - MODERATE edema: Swelling of lower leg to knee, pitting edema > 1/4 inch (6 mm) deep, rest and elevation only partially reduce swelling.   - SEVERE edema: Swelling extends above knee, facial or hand swelling present.      moderate 4. REDNESS: "Does the swelling look red or infected?"     no 5. PAIN: "Is the swelling painful to touch?" If Yes, ask: "How painful is it?"   (Scale 1-10; mild, moderate or severe)     denies 6. FEVER: "Do you have a fever?" If Yes, ask: "What is it, how was it measured, and when did it start?"      no 7. CAUSE: "What do you think is causing the leg swelling?"     No, states even when elevating feet they are still swollen, has not been wearing compression stockings. 8. MEDICAL HISTORY: "Do you have a history of blood clots (e.g., DVT), cancer, heart  failure, kidney disease, or liver failure?"     denies 9. RECURRENT SYMPTOM: "Have you had leg swelling before?" If Yes, ask: "When was the last time?" "What happened that time?"     For months 10. OTHER SYMPTOMS: "Do you have any other symptoms?" (e.g., chest pain, difficulty breathing)       denies 11. PREGNANCY: "Is there any chance you are pregnant?" "When was your last menstrual period?"       na  Protocols used: Leg Swelling and Edema-A-AH

## 2023-10-31 ENCOUNTER — Encounter: Payer: Self-pay | Admitting: Family Medicine

## 2023-10-31 ENCOUNTER — Ambulatory Visit: Admitting: Family Medicine

## 2023-10-31 VITALS — BP 110/64 | HR 98 | Temp 97.8°F | Ht 61.5 in | Wt 142.0 lb

## 2023-10-31 DIAGNOSIS — R600 Localized edema: Secondary | ICD-10-CM

## 2023-10-31 MED ORDER — TORSEMIDE 20 MG PO TABS: 20.0000 mg | ORAL_TABLET | Freq: Every day | ORAL | 2 refills | Status: DC

## 2023-11-06 ENCOUNTER — Ambulatory Visit: Payer: Self-pay

## 2023-11-06 DIAGNOSIS — R600 Localized edema: Secondary | ICD-10-CM | POA: Insufficient documentation

## 2023-11-06 NOTE — Assessment & Plan Note (Signed)
Stop furosemide.    Start torsemide 20 mg daily

## 2023-11-06 NOTE — Telephone Encounter (Signed)
 Per chart review, levocetirizine (XYZAL ) 5 MG is not on her med list.  Patient reports it was prescribed 06/2023 by PCP.    Cetrizine ( Zyrtec ) was prescribed 07/2023 by PA Craft.  Please advise.  Message routed appropriately. Patient willing to receive Mychart message or call patient twice (due to spam blocker).      Copied from CRM [id number]. Topic: Clinical - Medication Question >> Nov 06, 2023  1:49 PM Essie A wrote: Reason for CRM: Patient has 2 medications, one is levocetirizine (XYZAL ) 5 MG tablet and  cetirizine  (ZYRTEC ) 10 MG tablet.  She wants to know if she is supposed to take both of these.  Please return her call at [phone].

## 2023-11-08 ENCOUNTER — Telehealth: Payer: Self-pay | Admitting: Family Medicine

## 2023-11-08 NOTE — Telephone Encounter (Signed)
 [facility] Patient Statement of Consent

## 2023-11-11 ENCOUNTER — Other Ambulatory Visit: Payer: Self-pay

## 2023-11-11 DIAGNOSIS — F331 Major depressive disorder, recurrent, moderate: Secondary | ICD-10-CM

## 2023-11-11 DIAGNOSIS — J441 Chronic obstructive pulmonary disease with (acute) exacerbation: Secondary | ICD-10-CM

## 2023-11-11 DIAGNOSIS — K219 Gastro-esophageal reflux disease without esophagitis: Secondary | ICD-10-CM

## 2023-11-11 MED ORDER — FLUTICASONE PROPIONATE 50 MCG/ACT NA SUSP
1.0000 | Freq: Every day | NASAL | 3 refills | Status: DC
Start: 2023-11-11 — End: 2024-01-06

## 2023-11-11 MED ORDER — MONTELUKAST SODIUM 10 MG PO TABS
10.0000 mg | ORAL_TABLET | Freq: Every day | ORAL | 2 refills | Status: DC
Start: 2023-11-11 — End: 2023-12-25

## 2023-11-11 MED ORDER — LEVOTHYROXINE SODIUM 75 MCG PO TABS: 75.0000 ug | ORAL_TABLET | Freq: Every day | ORAL | 2 refills | Status: DC

## 2023-11-11 MED ORDER — ALBUTEROL SULFATE HFA 108 (90 BASE) MCG/ACT IN AERS
2.0000 | INHALATION_SPRAY | Freq: Four times a day (QID) | RESPIRATORY_TRACT | 3 refills | Status: DC | PRN
Start: 2023-11-11 — End: 2024-01-06

## 2023-11-11 MED ORDER — GABAPENTIN 600 MG PO TABS
600.0000 mg | ORAL_TABLET | Freq: Four times a day (QID) | ORAL | 2 refills | Status: DC
Start: 2023-11-11 — End: 2023-12-02

## 2023-11-11 MED ORDER — VITAMIN D (ERGOCALCIFEROL) 1.25 MG (50000 UNIT) PO CAPS: 50000.0000 [IU] | ORAL_CAPSULE | ORAL | 4 refills | Status: DC

## 2023-11-11 MED ORDER — BUSPIRONE HCL 30 MG PO TABS: 30.0000 mg | ORAL_TABLET | Freq: Two times a day (BID) | ORAL | 0 refills | Status: DC

## 2023-11-11 MED ORDER — PRIMIDONE 50 MG PO TABS
50.0000 mg | ORAL_TABLET | Freq: Two times a day (BID) | ORAL | 1 refills | Status: DC
Start: 2023-11-11 — End: 2024-01-06

## 2023-11-11 MED ORDER — AZELASTINE HCL 137 MCG/SPRAY NA SOLN: 2.0000 | Freq: Two times a day (BID) | NASAL | 3 refills | Status: DC

## 2023-11-11 MED ORDER — HYDROXYZINE PAMOATE 25 MG PO CAPS: 25.0000 mg | ORAL_CAPSULE | Freq: Three times a day (TID) | ORAL | 0 refills | Status: DC | PRN

## 2023-11-11 MED ORDER — ARIPIPRAZOLE 5 MG PO TABS: 5.0000 mg | ORAL_TABLET | Freq: Every day | ORAL | 1 refills | Status: DC

## 2023-11-11 MED ORDER — PANTOPRAZOLE SODIUM 40 MG PO TBEC: 40.0000 mg | DELAYED_RELEASE_TABLET | Freq: Two times a day (BID) | ORAL | 1 refills | Status: DC

## 2023-11-13 DIAGNOSIS — Z961 Presence of intraocular lens: Secondary | ICD-10-CM | POA: Diagnosis not present

## 2023-11-14 ENCOUNTER — Other Ambulatory Visit: Payer: Self-pay | Admitting: Family Medicine

## 2023-11-14 DIAGNOSIS — K219 Gastro-esophageal reflux disease without esophagitis: Secondary | ICD-10-CM

## 2023-11-14 NOTE — Telephone Encounter (Signed)
 Copied from CRM [id number]. Topic: Clinical - Medication Refill >> Nov 14, 2023  4:04 PM Minus Amel G wrote: Medication: pantoprazole  (PROTONIX ) 40 MG tablet [[id number]]  Has the patient contacted their pharmacy? Yes (Agent: If no, request that the patient contact the pharmacy for the refill. If patient does not wish to contact the pharmacy document the reason why and proceed with request.) (Agent: If yes, when and what did the pharmacy advise?)  This is the patient's preferred pharmacy:  [facility] [place] [address] [place] [address] Phone: [phone] Fax: [phone]  Is this the correct pharmacy for this prescription? Yes If no, delete pharmacy and type the correct one.   Has the prescription been filled recently? Yes  Is the patient out of the medication? Yes  Has the patient been seen for an appointment in the last year OR does the patient have an upcoming appointment? Yes  Can we respond through MyChart? Yes  Agent: Please be advised that Rx refills may take up to 3 business days. We ask that you follow-up with your pharmacy.

## 2023-11-18 ENCOUNTER — Ambulatory Visit: Payer: Self-pay

## 2023-11-18 ENCOUNTER — Ambulatory Visit (HOSPITAL_BASED_OUTPATIENT_CLINIC_OR_DEPARTMENT_OTHER)
Admission: RE | Admit: 2023-11-18 | Discharge: 2023-11-18 | Disposition: A | Discharge status: Home / Self Care | Source: Ambulatory Visit | Attending: Family Medicine | Admitting: Family Medicine

## 2023-11-18 ENCOUNTER — Ambulatory Visit (INDEPENDENT_AMBULATORY_CARE_PROVIDER_SITE_OTHER): Admitting: Family Medicine

## 2023-11-18 ENCOUNTER — Ambulatory Visit (INDEPENDENT_AMBULATORY_CARE_PROVIDER_SITE_OTHER)
Admission: RE | Admit: 2023-11-18 | Discharge: 2023-11-18 | Disposition: A | Discharge status: Home / Self Care | Source: Ambulatory Visit | Attending: Family Medicine | Admitting: Family Medicine

## 2023-11-18 ENCOUNTER — Encounter: Payer: Self-pay | Admitting: Family Medicine

## 2023-11-18 ENCOUNTER — Ambulatory Visit: Payer: Self-pay | Admitting: Family Medicine

## 2023-11-18 VITALS — BP 110/60 | HR 94 | Temp 97.3°F | Resp 14 | Ht 61.5 in | Wt 146.0 lb

## 2023-11-18 DIAGNOSIS — G8929 Other chronic pain: Secondary | ICD-10-CM

## 2023-11-18 DIAGNOSIS — R2241 Localized swelling, mass and lump, right lower limb: Secondary | ICD-10-CM

## 2023-11-18 DIAGNOSIS — E2740 Unspecified adrenocortical insufficiency: Secondary | ICD-10-CM | POA: Diagnosis not present

## 2023-11-18 DIAGNOSIS — M7989 Other specified soft tissue disorders: Secondary | ICD-10-CM | POA: Diagnosis not present

## 2023-11-18 DIAGNOSIS — M19011 Primary osteoarthritis, right shoulder: Secondary | ICD-10-CM | POA: Diagnosis not present

## 2023-11-18 DIAGNOSIS — Z8639 Personal history of other endocrine, nutritional and metabolic disease: Secondary | ICD-10-CM | POA: Diagnosis not present

## 2023-11-18 DIAGNOSIS — M25511 Pain in right shoulder: Secondary | ICD-10-CM

## 2023-11-18 DIAGNOSIS — E559 Vitamin D deficiency, unspecified: Secondary | ICD-10-CM | POA: Diagnosis not present

## 2023-11-18 DIAGNOSIS — M810 Age-related osteoporosis without current pathological fracture: Secondary | ICD-10-CM | POA: Diagnosis not present

## 2023-11-18 DIAGNOSIS — E039 Hypothyroidism, unspecified: Secondary | ICD-10-CM | POA: Diagnosis not present

## 2023-11-18 NOTE — Progress Notes (Signed)
 Subjective:  Patient ID: Connie Coffey, female    DOB: [birth year]  Age: 68 y.o. MRN: [id number]  Chief Complaint  Patient presents with   Shoulder Pain    Right     HPI: Patient is here for right shoulder pain with radiation to right side of the neck awoke on Thursday night from sleep and had severe pain. She has had pain ever since.  She would like to try an injection. Patient is concerned she is having a reaction to torsemide  20 mg daily. Feels like her arms are floating. Difficulty functioning due to the floating. Swelling has not improved.      10/31/2023   10:42 AM 08/30/2023    8:30 AM 05/17/2023    9:01 AM 05/14/2023    9:10 AM 02/11/2023    3:07 PM  Depression screen PHQ 2/9  Decreased Interest 2 1 1  0 0  Down, Depressed, Hopeless 1 1 1 3  0  PHQ - 2 Score 3 2 2 3  0  Altered sleeping 1 2 3 3    Tired, decreased energy 3 2 2 3  0  Change in appetite 1 0 0 3 0  Feeling bad or failure about yourself  1 0 0 1 0  Trouble concentrating 1 0 0 0 0  Moving slowly or fidgety/restless 1 1 1  0 0  Suicidal thoughts 0 0 0 0 0  PHQ-9 Score 11 7 8 13    Difficult doing work/chores Not difficult at all Somewhat difficult Somewhat difficult Somewhat difficult Not difficult at all        08/30/2023    8:24 AM  Fall Risk   Falls in the past year? 0  Number falls in past yr: 0  Injury with Fall? 0  Risk for fall due to : No Fall Risks  Follow up Falls evaluation completed    Patient Care Team: Sherre Clapper, MD as PCP - General (Family Medicine) Charlanne Groom, MD as PCP - Gastroenterology (Gastroenterology) Joshua Alm Hamilton, MD as Consulting Physician (Neurosurgery) Burt Fus, DPM as Consulting Physician (Podiatry) Marda General, MD as Consulting Physician (Urology) Ezzard Valaria LABOR, MD as Consulting Physician (Oncology) Nyle Rankin POUR, [facility] (Inactive) (Pharmacist) Vannie Elsie HERO, OD (Ophthalmology)   Review of Systems  Constitutional:  Negative for chills, fatigue  and fever.  HENT:  Negative for congestion, ear pain and sore throat.   Respiratory:  Negative for cough and shortness of breath.   Cardiovascular:  Positive for leg swelling. Negative for chest pain and palpitations.  Gastrointestinal:  Negative for abdominal pain, constipation, diarrhea, nausea and vomiting.  Endocrine: Negative for polydipsia, polyphagia and polyuria.  Genitourinary:  Negative for difficulty urinating and dysuria.  Musculoskeletal:  Positive for arthralgias (right shoulder pain) and neck pain. Negative for back pain and myalgias.  Skin:  Negative for rash.  Neurological:  Negative for headaches.  Psychiatric/Behavioral:  Negative for dysphoric mood. The patient is not nervous/anxious.     Current Outpatient Medications on File Prior to Visit  Medication Sig Dispense Refill   albuterol  (VENTOLIN  HFA) 108 (90 Base) MCG/ACT inhaler Inhale 2 puffs into the lungs every 6 (six) hours as needed for wheezing or shortness of breath. 54 each 3   ARIPiprazole  (ABILIFY ) 5 MG tablet Take 1 tablet (5 mg total) by mouth daily. 90 tablet 1   aspirin EC 81 MG tablet Take 81 mg by mouth daily. Swallow whole.     atorvastatin  (LIPITOR) 80 MG tablet Take 1 tablet (80 mg total)  by mouth daily. 90 tablet 0   Azelastine  HCl 137 MCG/SPRAY SOLN Place 2 sprays into the nose in the morning and at bedtime. 90 mL 3   bethanechol  (URECHOLINE ) 25 MG tablet Take 1 tablet (25 mg total) by mouth in the morning and at bedtime. 180 tablet 1   busPIRone  (BUSPAR ) 30 MG tablet Take 1 tablet (30 mg total) by mouth 2 (two) times daily. 180 tablet 0   cetirizine  (ZYRTEC ) 10 MG tablet TAKE 1 TABLET BY MOUTH EVERYDAY AT BEDTIME 90 tablet 2   cyclobenzaprine  (FLEXERIL ) 10 MG tablet Take 1 tablet (10 mg total) by mouth 3 (three) times daily. 270 tablet 1   dicyclomine (BENTYL) 20 MG tablet Take 20 mg by mouth 4 (four) times daily.     DULoxetine  (CYMBALTA ) 60 MG capsule Take 1 capsule (60 mg total) by mouth daily. 90  capsule 3   fenofibrate  160 MG tablet Take 1 tablet (160 mg total) by mouth daily. 90 tablet 0   fluticasone  (FLONASE ) 50 MCG/ACT nasal spray Place 1 spray into both nostrils daily. 48 mL 3   gabapentin  (NEURONTIN ) 600 MG tablet Take 1 tablet (600 mg total) by mouth 4 (four) times daily. 400 tablet 2   hydrocortisone  (CORTEF ) 10 MG tablet Take 1 tablet (10 mg total) by mouth 2 (two) times daily. 30 tablet 0   hydrOXYzine  (VISTARIL ) 25 MG capsule Take 1 capsule (25 mg total) by mouth every 8 (eight) hours as needed for anxiety or itching. 270 capsule 0   levothyroxine  (SYNTHROID ) 75 MCG tablet Take 1 tablet (75 mcg total) by mouth daily before breakfast. 90 tablet 2   lubiprostone  (AMITIZA ) 8 MCG capsule Take 1 capsule (8 mcg total) by mouth daily with breakfast. 200 capsule 2   montelukast  (SINGULAIR ) 10 MG tablet Take 1 tablet (10 mg total) by mouth daily. 100 tablet 2   Multiple Vitamin (MULTI-VITAMINS) TABS Take 1 tablet by mouth daily.     oxyCODONE -acetaminophen  (PERCOCET) 10-325 MG tablet Take 1 tablet by mouth every 8 (eight) hours as needed for pain. 30 tablet 0   pantoprazole  (PROTONIX ) 40 MG tablet Take 1 tablet (40 mg total) by mouth 2 (two) times daily. 180 tablet 1   primidone  (MYSOLINE ) 50 MG tablet Take 1 tablet (50 mg total) by mouth 2 (two) times daily. 180 tablet 1   Propylene Glycol (SYSTANE BALANCE) 0.6 % SOLN Place 1 drop into both eyes daily as needed (dry eyes). 15 mL 3   Tiotropium Bromide Monohydrate  (SPIRIVA  RESPIMAT) 2.5 MCG/ACT AERS USE 1 INHALATION BY MOUTH  TWICE DAILY 12 g 3   torsemide  (DEMADEX ) 20 MG tablet Take 1 tablet (20 mg total) by mouth daily. 30 tablet 2   Vitamin D , Ergocalciferol , (DRISDOL ) 1.25 MG (50000 UNIT) CAPS capsule Take 1 capsule (50,000 Units total) by mouth every Wednesday. 5 capsule 4   No current facility-administered medications on file prior to visit.   Past Medical History:  Diagnosis Date   Adrenal insufficiency (HCC)    Anxiety     Arthritis    Carpal tunnel syndrome    Both hands, worse in right   Chronic constipation    COPD (chronic obstructive pulmonary disease) (HCC)    CVA (cerebral vascular accident) (HCC)    Depression    Facet syndrome    Fibromyalgia    GERD (gastroesophageal reflux disease)    Heart murmur    Hypercholesteremia    IBS (irritable bowel syndrome)    Ischemic stroke (HCC)  07/19/2004   Lumbar radiculopathy    Osteoporosis    Radiculopathy, lumbar region 05/02/2021   RLS (restless legs syndrome)    Spinal stenosis    Thyroid  disease    TMJ (temporomandibular joint disorder)    Trochanteric bursitis of both hips    Vascular malformation    spinal cord   Past Surgical History:  Procedure Laterality Date   APPENDECTOMY     CARPAL TUNNEL RELEASE Right 12/13/2017   surgery   CATARACT EXTRACTION Bilateral    CHOLECYSTECTOMY     COLONOSCOPY  05/06/2017   Colonic polyps status post polypectomy. Interan land external hemorrhoids.    ESOPHAGOGASTRODUODENOSCOPY  04/28/2007   Mild gastrtiis. Otherwise normal EGD.    HEMORROIDECTOMY     LAMINECTOMY AND MICRODISCECTOMY LUMBAR SPINE  01/03/2005   NECK SURGERY     x2    OTHER SURGICAL HISTORY  04/24/2004   anterior cervical discectomy and fusion at C5-C6   SPINAL CORD STIMULATOR INSERTION N/A 07/21/2021   Procedure: Permanant Spinal cord stimulator placement leads and battery under fluroscopy;  Surgeon: Darlis Deatrice RAMAN, MD;  Location: [facility] OR;  Service: Neurosurgery;  Laterality: N/A;   TONSILLECTOMY     TOTAL ABDOMINAL HYSTERECTOMY      Family History  Problem Relation Age of Onset   Supraventricular tachycardia Mother    Dementia Mother    Hyperlipidemia Mother    Hypertension Mother    Osteoarthritis Mother    Osteoporosis Mother    Breast cancer Other    Breast cancer Other    Colon cancer Neg Hx    Esophageal cancer Neg Hx    Social History   Socioeconomic History   Marital status: Divorced    Spouse name: Not on  file   Number of children: 1   Years of education: Not on file   Highest education level: Not on file  Occupational History   Occupation: Disabled  Tobacco Use   Smoking status: Every Day    Current packs/day: 0.50    Types: Cigarettes   Smokeless tobacco: Never  Vaping Use   Vaping status: Never Used  Substance and Sexual Activity   Alcohol use: Not Currently    Comment: quit 19 years   Drug use: Never   Sexual activity: Not Currently  Other Topics Concern   Not on file  Social History Narrative   Disabled son lives with her sometimes and his father sometimes.  [place] is working hard to have a relationship with her son's father and his wife.  They help her when needed.   Social Drivers of Corporate investment banker Strain: Low Risk  (05/14/2023)   Overall Financial Resource Strain (CARDIA)    Difficulty of Paying Living Expenses: Not hard at all  Food Insecurity: No Food Insecurity (05/14/2023)   Hunger Vital Sign    Worried About Running Out of Food in the Last Year: Never true    Ran Out of Food in the Last Year: Never true  Transportation Needs: No Transportation Needs (05/14/2023)   PRAPARE - Administrator, Civil Service (Medical): No    Lack of Transportation (Non-Medical): No  Physical Activity: Inactive (10/31/2023)   Exercise Vital Sign    Days of Exercise per Week: 0 days    Minutes of Exercise per Session: 0 min  Stress: Stress Concern Present (05/14/2023)   [employer] of Occupational Health - Occupational Stress Questionnaire    Feeling of Stress : Rather much  Social  Connections: Socially Isolated (05/14/2023)   Social Connection and Isolation Panel    Frequency of Communication with Friends and Family: More than three times a week    Frequency of Social Gatherings with Friends and Family: More than three times a week    Attends Religious Services: Never    Database administrator or Organizations: No    Attends Hospital doctor: Never    Marital Status: Divorced    Objective:  BP 110/60   Pulse 94   Temp (!) 97.3 F (36.3 C)   Resp 14   Ht 5' 1.5 (1.562 m)   Wt 146 lb (66.2 kg)   SpO2 94%   BMI 27.14 kg/m      11/18/2023    1:54 PM 10/31/2023   10:36 AM 09/13/2023   11:44 AM  BP/Weight  Systolic BP 110 110 123  Diastolic BP 60 64 72  Wt. (Lbs) 146 142 141.6  BMI 27.14 kg/m2 26.4 kg/m2 26.32 kg/m2    Physical Exam Vitals reviewed.  Constitutional:      Appearance: Normal appearance.   Cardiovascular:     Rate and Rhythm: Normal rate and regular rhythm.     Heart sounds: Normal heart sounds.  Pulmonary:     Effort: Pulmonary effort is normal.     Breath sounds: Normal breath sounds.   Musculoskeletal:     Right lower leg: Edema (POSITIVE HOMENS.) present.     Left lower leg: No edema.     Comments: RIGHT SHOULDER EXAM TENDER: ANTERIORLY FROM ABNORMAL ABDUCTION: LIMITED EXTERNAL ROTATION: LIMITED INTERNAL ROTATION: LIMITED EMPTY CAN SIGN: POSITIVE.   LEFT SHOULDER EXAM TENDER: none FROM NORMAL EMPTY CAN SIGN: NEGATIVE.    Neurological:     Mental Status: She is alert and oriented to person, place, and time.   Psychiatric:        Mood and Affect: Mood normal.        Behavior: Behavior normal.    Joint Injection/Arthrocentesis  Date/Time: 11/18/2023 2:47 PM  Performed by: Sherre Clapper, MD Authorized by: Sherre Clapper, MD  Indications: pain  Body area: shoulder Joint: right shoulder Local anesthesia used: yes  Anesthesia: Local anesthesia used: yes Local Anesthetic: topical anesthetic  Sedation: Patient sedated: no  Needle size: 22 G Ultrasound guidance: no Approach: posterior Triamcinolone  amount: 40 mg Lidocaine  1% amount: 5 mL Patient tolerance: patient tolerated the procedure well with no immediate complications          Lab Results  Component Value Date   WBC 11.3 (H) 09/05/2023   HGB 13.9 09/05/2023   HCT 41.3 09/05/2023   PLT 467 (H)  09/05/2023   GLUCOSE 102 (H) 08/30/2023   CHOL 162 08/30/2023   TRIG 133 08/30/2023   HDL 82 08/30/2023   LDLCALC 58 08/30/2023   ALT 16 08/30/2023   AST 18 08/30/2023   NA 143 08/30/2023   K 4.1 08/30/2023   CL 103 08/30/2023   CREATININE 0.83 08/30/2023   BUN 12 08/30/2023   CO2 26 08/30/2023   TSH 0.663 08/30/2023   HGBA1C 5.8 (H) 08/30/2023      Assessment & Plan:  Chronic right shoulder pain Assessment & Plan: Arthrocentesis performed without complication. ORDERING A SHOULDER XRAY   Orders: -     DG Shoulder Right; Future -     Arthrocentesis  Localized swelling of right lower extremity Assessment & Plan: STOP TORSEMIDE .  START FUROSEMIDE  20 MG every day.  ORDERING ULTRASOUND RIGHT LEG.  Came back negative.   [facility] [address] [place], [place] [address] [phone]  IMAGING 8-5 PM MONDAY THROUGH FRIDAY.   Orders: -     US  Venous Img Lower Unilateral Right (DVT); Future     No orders of the defined types were placed in this encounter.   Orders Placed This Encounter  Procedures   Joint Injection/Arthrocentesis   DG Shoulder Right   US  Venous Img Lower Unilateral Right     Follow-up: Return in about 3 weeks (around 12/09/2023) for chronic follow up SWELLING with Dina.SABRA LILLETTE Kato I Leal-Borjas,acting as a scribe for Abigail Free, MD.,have documented all relevant documentation on the behalf of Abigail Free, MD,as directed by  Abigail Free, MD while in the presence of Abigail Free, MD.   An After Visit Summary was printed and given to the patient.  I attest that I have reviewed this visit and agree with the plan scribed by my staff.   Abigail Free, MD [facility] [phone]

## 2023-11-18 NOTE — Patient Instructions (Signed)
 STOP TORSEMIDE .  START FUROSEMIDE  20 MG every day.  ORDERING ULTRASOUND RIGHT LEG.  ORDERING A SHOULDER [facility] [address] [place], [place] [address] [phone]  IMAGING 8-5 PM MONDAY THROUGH FRIDAY.

## 2023-11-18 NOTE — Telephone Encounter (Signed)
 FYI Only or Action Required?: FYI only for provider.  Patient was last seen in primary care on 10/31/2023 by Connie Clapper, MD. Called Nurse Triage reporting Muscle Pain. Symptoms began yesterday. Interventions attempted: prescription pain med, OTC pain meds, ice/heat. Symptoms are: unchanged.  Triage Disposition: See HCP Within 4 Hours (Or PCP Triage)  Patient/caregiver understands and will follow disposition?: Yes, will follow disposition  Copied from CRM [id number]. Topic: Clinical - Red Word Triage >> Nov 18, 2023 11:19 AM [name] wrote: Red Word that prompted transfer to Nurse Triage: patient pulled a muscle and its causing severe pain. The pain runs from her neck down to her arm Reason for Disposition  Weakness (i.e., loss of strength) in hand or fingers  (Exception: Not truly weak; hand feels weak because of pain.)  Answer Assessment - Initial Assessment Questions 1. ONSET: When did the pain start?     Last night during sleep 2. LOCATION: Where is the pain located?     R shoulder runs to neck, back and down 3. PAIN: How bad is the pain? (Scale 1-10; or mild, moderate, severe)   - MILD (1-3): doesn't interfere with normal activities   - MODERATE (4-7): interferes with normal activities (e.g., work or school) or awakens from sleep   - SEVERE (8-10): excruciating pain, unable to do any normal activities, unable to move arm at all due to pain     8 4. WORK OR EXERCISE: Has there been any recent work or exercise that involved this part of the body?     denies 5. CAUSE: What do you think is causing the shoulder pain?     Feels like the last time I had lock shoulder 6. OTHER SYMPTOMS: Do you have any other symptoms? (e.g., neck pain, swelling, rash, fever, numbness, weakness)     Neck pain, weakness-I can't grasp things when it is flared up.  Protocols used: Shoulder Pain-A-AH

## 2023-11-21 NOTE — Assessment & Plan Note (Addendum)
 Arthrocentesis performed without complication. ORDERING A SHOULDER XRAY

## 2023-11-21 NOTE — Assessment & Plan Note (Addendum)
 STOP TORSEMIDE .  START FUROSEMIDE  20 MG every day.  ORDERING ULTRASOUND RIGHT LEG.  Came back negative.   [facility] [address] [place], [place] [address] [phone]  IMAGING 8-5 PM MONDAY THROUGH FRIDAY.

## 2023-11-25 DIAGNOSIS — D125 Benign neoplasm of sigmoid colon: Secondary | ICD-10-CM | POA: Diagnosis not present

## 2023-11-25 DIAGNOSIS — D122 Benign neoplasm of ascending colon: Secondary | ICD-10-CM | POA: Diagnosis not present

## 2023-11-25 DIAGNOSIS — Z860100 Personal history of colon polyps, unspecified: Secondary | ICD-10-CM | POA: Diagnosis not present

## 2023-11-25 DIAGNOSIS — K648 Other hemorrhoids: Secondary | ICD-10-CM | POA: Diagnosis not present

## 2023-11-25 DIAGNOSIS — Z1211 Encounter for screening for malignant neoplasm of colon: Secondary | ICD-10-CM | POA: Diagnosis not present

## 2023-11-25 DIAGNOSIS — D123 Benign neoplasm of transverse colon: Secondary | ICD-10-CM | POA: Diagnosis not present

## 2023-12-02 ENCOUNTER — Ambulatory Visit (INDEPENDENT_AMBULATORY_CARE_PROVIDER_SITE_OTHER): Admitting: Physician Assistant

## 2023-12-02 ENCOUNTER — Other Ambulatory Visit: Payer: Self-pay | Admitting: Family Medicine

## 2023-12-02 ENCOUNTER — Encounter: Payer: Self-pay | Admitting: Physician Assistant

## 2023-12-02 ENCOUNTER — Telehealth: Payer: Self-pay | Admitting: Family Medicine

## 2023-12-02 VITALS — BP 152/82 | HR 92 | Temp 97.8°F | Ht 61.5 in | Wt 144.0 lb

## 2023-12-02 DIAGNOSIS — F5101 Primary insomnia: Secondary | ICD-10-CM | POA: Diagnosis not present

## 2023-12-02 DIAGNOSIS — K219 Gastro-esophageal reflux disease without esophagitis: Secondary | ICD-10-CM

## 2023-12-02 DIAGNOSIS — M25511 Pain in right shoulder: Secondary | ICD-10-CM

## 2023-12-02 DIAGNOSIS — G8929 Other chronic pain: Secondary | ICD-10-CM | POA: Diagnosis not present

## 2023-12-02 DIAGNOSIS — R600 Localized edema: Secondary | ICD-10-CM

## 2023-12-02 MED ORDER — PANTOPRAZOLE SODIUM 40 MG PO TBEC: 40.0000 mg | DELAYED_RELEASE_TABLET | Freq: Two times a day (BID) | ORAL | 1 refills | Status: DC

## 2023-12-02 MED ORDER — GABAPENTIN 600 MG PO TABS: 600.0000 mg | ORAL_TABLET | Freq: Four times a day (QID) | ORAL | 2 refills | Status: DC

## 2023-12-02 MED ORDER — TORSEMIDE 20 MG PO TABS
20.0000 mg | ORAL_TABLET | Freq: Every day | ORAL | 2 refills | Status: DC
Start: 2023-12-02 — End: 2023-12-25

## 2023-12-02 MED ORDER — ATORVASTATIN CALCIUM 80 MG PO TABS: 80.0000 mg | ORAL_TABLET | Freq: Every day | ORAL | 0 refills | Status: DC

## 2023-12-02 NOTE — Progress Notes (Signed)
 Acute Office Visit  Subjective:    Patient ID: Connie Coffey, female    DOB: [birth year], 68 y.o.   MRN: [id number]  Chief Complaint  Patient presents with   Right shoulder pain    HPI: Discussed the use of AI scribe software for clinical note transcription with the patient, who gave verbal consent to proceed.  History of Present Illness   Connie Coffey is a 68 year old female who presents with persistent shoulder pain and sleep disturbances.  She has been experiencing persistent shoulder pain for two to three months, described as a pulling sensation radiating down her arm. The pain is located at the front of the shoulder and is aggravated by movement. She has tried using ice, heat, and pain medications every four hours, but these provide only temporary relief, with the pain returning two hours post-medication. A shoulder injection in June provided some relief, but the pain has since returned, though it is less sharp but remains tender and aggravated. She is unable to find her TENS unit, which she intended to use for pain management.  The shoulder pain significantly impacts her sleep, allowing her to sleep only one to two hours at a time, often waking up in pain, especially if she rolls onto the affected side. She has resorted to sleeping in a recliner, which is uncomfortable and further disrupts her sleep.  Her family history includes a strong prevalence of arthritis, affecting her son and cousin. She is currently taking gabapentin  600 mg four times a day but has run out of her medication and is experiencing difficulty obtaining a refill due to issues with her mail-order pharmacy. She previously tried an 800 mg dose but could not tolerate it due to stomach issues.  She is trying to quit smoking and has been using mints to help with this.       Past Medical History:  Diagnosis Date   Adrenal insufficiency (HCC)    Anxiety    Arthritis    Carpal tunnel syndrome    Both hands, worse in  right   Chronic constipation    COPD (chronic obstructive pulmonary disease) (HCC)    CVA (cerebral vascular accident) (HCC)    Depression    Facet syndrome    Fibromyalgia    GERD (gastroesophageal reflux disease)    Heart murmur    Hypercholesteremia    IBS (irritable bowel syndrome)    Ischemic stroke (HCC)    07/19/2004   Lumbar radiculopathy    Osteoporosis    Radiculopathy, lumbar region 05/02/2021   RLS (restless legs syndrome)    Spinal stenosis    Thyroid  disease    TMJ (temporomandibular joint disorder)    Trochanteric bursitis of both hips    Vascular malformation    spinal cord    Past Surgical History:  Procedure Laterality Date   APPENDECTOMY     CARPAL TUNNEL RELEASE Right 12/13/2017   surgery   CATARACT EXTRACTION Bilateral    CHOLECYSTECTOMY     COLONOSCOPY  05/06/2017   Colonic polyps status post polypectomy. Interan land external hemorrhoids.    ESOPHAGOGASTRODUODENOSCOPY  04/28/2007   Mild gastrtiis. Otherwise normal EGD.    HEMORROIDECTOMY     LAMINECTOMY AND MICRODISCECTOMY LUMBAR SPINE  01/03/2005   NECK SURGERY     x2    OTHER SURGICAL HISTORY  04/24/2004   anterior cervical discectomy and fusion at C5-C6   SPINAL CORD STIMULATOR INSERTION N/A 07/21/2021   Procedure: Permanant Spinal cord  stimulator placement leads and battery under fluroscopy;  Surgeon: Darlis Deatrice RAMAN, MD;  Location: [facility] OR;  Service: Neurosurgery;  Laterality: N/A;   TONSILLECTOMY     TOTAL ABDOMINAL HYSTERECTOMY      Family History  Problem Relation Age of Onset   Supraventricular tachycardia Mother    Dementia Mother    Hyperlipidemia Mother    Hypertension Mother    Osteoarthritis Mother    Osteoporosis Mother    Breast cancer Other    Breast cancer Other    Colon cancer Neg Hx    Esophageal cancer Neg Hx     Social History   Socioeconomic History   Marital status: Divorced    Spouse name: Not on file   Number of children: 1   Years of education: Not on  file   Highest education level: Not on file  Occupational History   Occupation: Disabled  Tobacco Use   Smoking status: Every Day    Current packs/day: 0.50    Types: Cigarettes   Smokeless tobacco: Never  Vaping Use   Vaping status: Never Used  Substance and Sexual Activity   Alcohol use: Not Currently    Comment: quit 19 years   Drug use: Never   Sexual activity: Not Currently  Other Topics Concern   Not on file  Social History Narrative   Disabled son lives with her sometimes and his father sometimes.  Connie Coffey is working hard to have a relationship with her son's father and his wife.  They help her when needed.   Social Drivers of Corporate investment banker Strain: Low Risk  (05/14/2023)   Overall Financial Resource Strain (CARDIA)    Difficulty of Paying Living Expenses: Not hard at all  Food Insecurity: No Food Insecurity (05/14/2023)   Hunger Vital Sign    Worried About Running Out of Food in the Last Year: Never true    Ran Out of Food in the Last Year: Never true  Transportation Needs: No Transportation Needs (05/14/2023)   PRAPARE - Administrator, Civil Service (Medical): No    Lack of Transportation (Non-Medical): No  Physical Activity: Inactive (10/31/2023)   Exercise Vital Sign    Days of Exercise per Week: 0 days    Minutes of Exercise per Session: 0 min  Stress: Stress Concern Present (05/14/2023)   [employer] of Occupational Health - Occupational Stress Questionnaire    Feeling of Stress : Rather much  Social Connections: Socially Isolated (05/14/2023)   Social Connection and Isolation Panel    Frequency of Communication with Friends and Family: More than three times a week    Frequency of Social Gatherings with Friends and Family: More than three times a week    Attends Religious Services: Never    Database administrator or Organizations: No    Attends Banker Meetings: Never    Marital Status: Divorced  Catering manager  Violence: Not At Risk (05/14/2023)   Humiliation, Afraid, Rape, and Kick questionnaire    Fear of Current or Ex-Partner: No    Emotionally Abused: No    Physically Abused: No    Sexually Abused: No    Outpatient Medications Prior to Visit  Medication Sig Dispense Refill   albuterol  (VENTOLIN  HFA) 108 (90 Base) MCG/ACT inhaler Inhale 2 puffs into the lungs every 6 (six) hours as needed for wheezing or shortness of breath. 54 each 3   ARIPiprazole  (ABILIFY ) 5 MG tablet Take 1 tablet (5  mg total) by mouth daily. 90 tablet 1   aspirin EC 81 MG tablet Take 81 mg by mouth daily. Swallow whole.     atorvastatin  (LIPITOR) 80 MG tablet Take 1 tablet (80 mg total) by mouth daily. 90 tablet 0   Azelastine  HCl 137 MCG/SPRAY SOLN Place 2 sprays into the nose in the morning and at bedtime. 90 mL 3   bethanechol  (URECHOLINE ) 25 MG tablet Take 1 tablet (25 mg total) by mouth in the morning and at bedtime. 180 tablet 1   busPIRone  (BUSPAR ) 30 MG tablet Take 1 tablet (30 mg total) by mouth 2 (two) times daily. 180 tablet 0   cetirizine  (ZYRTEC ) 10 MG tablet TAKE 1 TABLET BY MOUTH EVERYDAY AT BEDTIME 90 tablet 2   cyclobenzaprine  (FLEXERIL ) 10 MG tablet Take 1 tablet (10 mg total) by mouth 3 (three) times daily. 270 tablet 1   dicyclomine (BENTYL) 20 MG tablet Take 20 mg by mouth 4 (four) times daily.     DULoxetine  (CYMBALTA ) 60 MG capsule Take 1 capsule (60 mg total) by mouth daily. 90 capsule 3   fenofibrate  160 MG tablet Take 1 tablet (160 mg total) by mouth daily. 90 tablet 0   fluticasone  (FLONASE ) 50 MCG/ACT nasal spray Place 1 spray into both nostrils daily. 48 mL 3   hydrocortisone  (CORTEF ) 10 MG tablet Take 1 tablet (10 mg total) by mouth 2 (two) times daily. 30 tablet 0   hydrOXYzine  (VISTARIL ) 25 MG capsule Take 1 capsule (25 mg total) by mouth every 8 (eight) hours as needed for anxiety or itching. 270 capsule 0   levothyroxine  (SYNTHROID ) 75 MCG tablet Take 1 tablet (75 mcg total) by mouth daily  before breakfast. 90 tablet 2   lubiprostone  (AMITIZA ) 8 MCG capsule Take 1 capsule (8 mcg total) by mouth daily with breakfast. 200 capsule 2   montelukast  (SINGULAIR ) 10 MG tablet Take 1 tablet (10 mg total) by mouth daily. 100 tablet 2   Multiple Vitamin (MULTI-VITAMINS) TABS Take 1 tablet by mouth daily.     oxyCODONE -acetaminophen  (PERCOCET) 10-325 MG tablet Take 1 tablet by mouth every 8 (eight) hours as needed for pain. 30 tablet 0   pantoprazole  (PROTONIX ) 40 MG tablet Take 1 tablet (40 mg total) by mouth 2 (two) times daily. 180 tablet 1   primidone  (MYSOLINE ) 50 MG tablet Take 1 tablet (50 mg total) by mouth 2 (two) times daily. 180 tablet 1   Propylene Glycol (SYSTANE BALANCE) 0.6 % SOLN Place 1 drop into both eyes daily as needed (dry eyes). 15 mL 3   Tiotropium Bromide Monohydrate  (SPIRIVA  RESPIMAT) 2.5 MCG/ACT AERS USE 1 INHALATION BY MOUTH  TWICE DAILY 12 g 3   torsemide  (DEMADEX ) 20 MG tablet Take 1 tablet (20 mg total) by mouth daily. 30 tablet 2   Vitamin D , Ergocalciferol , (DRISDOL ) 1.25 MG (50000 UNIT) CAPS capsule Take 1 capsule (50,000 Units total) by mouth every Wednesday. 5 capsule 4   gabapentin  (NEURONTIN ) 600 MG tablet Take 1 tablet (600 mg total) by mouth 4 (four) times daily. 400 tablet 2   No facility-administered medications prior to visit.    Allergies  Allergen Reactions   Beta Vulgaris Anaphylaxis    Beets    Penicillins Shortness Of Breath   Cat Dander    Celecoxib Other (See Comments)    because of stomach ulcers   Chantix [Varenicline]     Makes pt sick on her stomach    Dog Epithelium (Canis Lupus Familiaris)  Cats and dogs   Fish Oil Other (See Comments)    hemorrhoids   Gramineae Pollens    Molds & Smuts    Nsaids Other (See Comments)    GI Upset. Ulcers   Other Other (See Comments)    beets  Including Celebrex   Tape Dermatitis    Electrodes caused blistering   Nicotine Nausea Only and Other (See Comments)    Review of Systems   Constitutional:  Negative for appetite change, fatigue and fever.  HENT:  Negative for congestion, ear pain, sinus pressure and sore throat.   Respiratory:  Negative for cough, chest tightness, shortness of breath and wheezing.   Cardiovascular:  Negative for chest pain and palpitations.  Gastrointestinal:  Negative for abdominal pain, constipation, diarrhea, nausea and vomiting.  Genitourinary:  Negative for dysuria and hematuria.  Musculoskeletal:  Positive for myalgias (Right shoulder pain x several weeks). Negative for arthralgias, back pain and joint swelling.  Skin:  Negative for rash.  Neurological:  Negative for dizziness, weakness and headaches.  Psychiatric/Behavioral:  Negative for dysphoric mood. The patient is not nervous/anxious.        Objective:        12/02/2023    2:58 PM 11/18/2023    1:54 PM 10/31/2023   10:36 AM  Vitals with BMI  Height 5' 1.5 5' 1.5 5' 1.5  Weight 144 lbs 146 lbs 142 lbs  BMI 26.77 27.14 26.4  Systolic 152 110 889  Diastolic 82 60 64  Pulse 92 94 98    No data found.   Physical Exam Vitals reviewed.  Constitutional:      Appearance: Normal appearance.  Cardiovascular:     Rate and Rhythm: Normal rate and regular rhythm.     Heart sounds: Normal heart sounds.  Pulmonary:     Effort: Pulmonary effort is normal.     Breath sounds: Normal breath sounds.  Abdominal:     General: Bowel sounds are normal.     Palpations: Abdomen is soft.     Tenderness: There is no abdominal tenderness.  Musculoskeletal:     Right shoulder: Tenderness present. No swelling or deformity. Decreased range of motion. Decreased strength.  Neurological:     Mental Status: She is alert and oriented to person, place, and time.  Psychiatric:        Mood and Affect: Mood normal.        Behavior: Behavior normal.     Health Maintenance Due  Topic Date Due   Zoster Vaccines- Shingrix (1 of 2) 05/04/2004   COVID-19 Vaccine (5 - 2024-25 season) 01/27/2023     There are no preventive care reminders to display for this patient.   Lab Results  Component Value Date   TSH 0.663 08/30/2023   Lab Results  Component Value Date   WBC 11.3 (H) 09/05/2023   HGB 13.9 09/05/2023   HCT 41.3 09/05/2023   MCV 92 09/05/2023   PLT 467 (H) 09/05/2023   Lab Results  Component Value Date   NA 143 08/30/2023   K 4.1 08/30/2023   CO2 26 08/30/2023   GLUCOSE 102 (H) 08/30/2023   BUN 12 08/30/2023   CREATININE 0.83 08/30/2023   BILITOT 0.3 08/30/2023   ALKPHOS 49 08/30/2023   AST 18 08/30/2023   ALT 16 08/30/2023   PROT 6.6 08/30/2023   ALBUMIN 4.4 08/30/2023   CALCIUM  9.3 08/30/2023   ANIONGAP 8 07/21/2021   EGFR 76 08/30/2023   Lab Results  Component  Value Date   CHOL 162 08/30/2023   Lab Results  Component Value Date   HDL 82 08/30/2023   Lab Results  Component Value Date   LDLCALC 58 08/30/2023   Lab Results  Component Value Date   TRIG 133 08/30/2023   Lab Results  Component Value Date   CHOLHDL 2.0 08/30/2023   Lab Results  Component Value Date   HGBA1C 5.8 (H) 08/30/2023       Assessment & Plan:  Chronic right shoulder pain Assessment & Plan: Chronic shoulder pain with suspected biceps tendon involvement. Persistent despite analgesics and non-pharmacological interventions. MRI needed to assess for pathology not visible on x-ray. - Order MRI of the shoulder. - Continue current pain management regimen. - Advise use of left hand for lifting heavier objects until MRI results are available.  Orders: -     MR SHOULDER RIGHT WO CONTRAST; Future  Primary insomnia Assessment & Plan: Insomnia likely secondary to chronic shoulder pain. Recliner sleeping may worsen sleep quality. - Address shoulder pain to improve sleep quality.      Gabapentin  Refill Issue Ran out of gabapentin  due to mail-order pharmacy issues. Current dose effective but refill challenges persist. - Contact pain clinic to resolve gabapentin  refill  issue. - Consider bridge prescription if pain clinic cannot expedite refill.        No orders of the defined types were placed in this encounter.   Orders Placed This Encounter  Procedures   MR Shoulder Right Wo Contrast     Follow-up: No follow-ups on file.  An After Visit Summary was printed and given to the patient.   I,Lauren M Auman,acting as a Neurosurgeon for [employer], PA.,have documented all relevant documentation on the behalf of Nola Angles, PA,as directed by  Nola Angles, PA while in the presence of Nola Angles, [place].    Nola Angles, [place] [facility] [phone]

## 2023-12-02 NOTE — Telephone Encounter (Signed)
 Copied from CRM [id number]. Topic: Clinical - Medication Refill >> Dec 02, 2023 11:31 AM Donee H wrote: Medication: gabapentin  (NEURONTIN ) 600 MG tablet   Has the patient contacted their pharmacy? Yes (Agent: If no, request that the patient contact the pharmacy for the refill. If patient does not wish to contact the pharmacy document the reason why and proceed with request.) (Agent: If yes, when and what did the pharmacy advise?)  This is the patient's preferred pharmacy:  [facility] [address], [place] - [address] [place] [address] Phone: [phone] Fax: [phone]    Is this the correct pharmacy for this prescription? Yes If no, delete pharmacy and type the correct one.   Has the prescription been filled recently? No  Is the patient out of the medication? No, but only have enough for 2 days. Patient is requesting a refill as soon as possible   Has the patient been seen for an appointment in the last year OR does the patient have an upcoming appointment? Yes  Can we respond through MyChart? Yes  Agent: Please be advised that Rx refills may take up to 3 business days. We ask that you follow-up with your pharmacy.

## 2023-12-02 NOTE — Telephone Encounter (Signed)
 Copied from CRM [id number]. Topic: Clinical - Medication Refill >> Dec 02, 2023  4:07 PM Elle L wrote: Medication: fenofibrate  160 MG tablet   Has the patient contacted their pharmacy? Yes  This is the patient's preferred pharmacy:  [facility] [place] - [address] [place] [address] Phone: [phone] Fax: [phone]  Is this the correct pharmacy for this prescription? Yes  Has the prescription been filled recently? Yes  Is the patient out of the medication? Yes  Has the patient been seen for an appointment in the last year OR does the patient have an upcoming appointment? Yes  Can we respond through MyChart? No  Agent: Please be advised that Rx refills may take up to 3 business days. We ask that you follow-up with your pharmacy.

## 2023-12-02 NOTE — Telephone Encounter (Signed)
 Copied from CRM [id number]. Topic: Clinical - Medication Refill >> Dec 02, 2023  5:08 PM Carla L wrote: Medication: furosemide  20 mg Patient reached out to [facility] for refill. [facility] reqeusting refill on behalf of patient.   Has the patient contacted their pharmacy? Yes  This is the patient's preferred pharmacy:  [facility] [place] [address] [place] [address] Phone: [phone] Fax: [phone]  Is this the correct pharmacy for this prescription? Yes If no, delete pharmacy and type the correct one.   Has the prescription been filled recently? No  Is the patient out of the medication? Pharmacy unsure.   Has the patient been seen for an appointment in the last year OR does the patient have an upcoming appointment? Yes  Can we respond through MyChart? No  Agent: Please be advised that Rx refills may take up to 3 business days. We ask that you follow-up with your pharmacy.

## 2023-12-02 NOTE — Telephone Encounter (Signed)
 Copied from CRM [id number]. Topic: Clinical - Medication Refill >> Dec 02, 2023 10:45 AM Wess RAMAN wrote: Medication: pantoprazole  (PROTONIX ) 40 MG tablet  torsemide  (DEMADEX ) 20 MG tablet  atorvastatin  (LIPITOR) 80 MG tablet   Has the patient contacted their pharmacy? Yes (Agent: If no, request that the patient contact the pharmacy for the refill. If patient does not wish to contact the pharmacy document the reason why and proceed with request.) (Agent: If yes, when and what did the pharmacy advise?) Pharmacy called  This is the patient's preferred pharmacy:   [facility] [place] [address] [place] [address] Phone: [phone] Fax: [phone]  Is this the correct pharmacy for this prescription? Yes If no, delete pharmacy and type the correct one.   Has the prescription been filled recently? Yes  Is the patient out of the medication? Yes  Has the patient been seen for an appointment in the last year OR does the patient have an upcoming appointment? Yes  Can we respond through MyChart? Yes  Agent: Please be advised that Rx refills may take up to 3 business days. We ask that you follow-up with your pharmacy.

## 2023-12-05 DIAGNOSIS — R198 Other specified symptoms and signs involving the digestive system and abdomen: Secondary | ICD-10-CM | POA: Diagnosis not present

## 2023-12-08 NOTE — Assessment & Plan Note (Signed)
 Insomnia likely secondary to chronic shoulder pain. Recliner sleeping may worsen sleep quality. - Address shoulder pain to improve sleep quality.

## 2023-12-08 NOTE — Assessment & Plan Note (Signed)
 Chronic shoulder pain with suspected biceps tendon involvement. Persistent despite analgesics and non-pharmacological interventions. MRI needed to assess for pathology not visible on x-ray. - Order MRI of the shoulder. - Continue current pain management regimen. - Advise use of left hand for lifting heavier objects until MRI results are available.

## 2023-12-09 ENCOUNTER — Ambulatory Visit: Admitting: Family Medicine

## 2023-12-12 ENCOUNTER — Other Ambulatory Visit: Payer: Self-pay | Admitting: Family Medicine

## 2023-12-12 DIAGNOSIS — E782 Mixed hyperlipidemia: Secondary | ICD-10-CM

## 2023-12-12 MED ORDER — FENOFIBRATE 160 MG PO TABS: 160.0000 mg | ORAL_TABLET | Freq: Every day | ORAL | 0 refills | Status: DC

## 2023-12-12 NOTE — Telephone Encounter (Signed)
 Copied from CRM [id number]. Topic: Clinical - Medication Refill >> Dec 12, 2023 12:55 PM Santiya F wrote: Medication: fenofibrate  160 MG tablet [[id number]], bethanechol  (URECHOLINE ) 25 MG tablet [[id number]], furosemide  20 MG  Has the patient contacted their pharmacy? Yes  (Agent: If yes, when and what did the pharmacy advise?) pharmacy called for refill  This is the patient's preferred pharmacy:   [facility] [place] [address] [place] [address] Phone: [phone] Fax: [phone]  Is this the correct pharmacy for this prescription? Yes  Has the prescription been filled recently? Yes  Is the patient out of the medication? Yes  Has the patient been seen for an appointment in the last year OR does the patient have an upcoming appointment? Yes  Can we respond through MyChart? No  Agent: Please be advised that Rx refills may take up to 3 business days. We ask that you follow-up with your pharmacy.

## 2023-12-12 NOTE — Telephone Encounter (Signed)
 Copied from CRM [id number]. Topic: Clinical - Medication Refill >> Dec 12, 2023  2:02 PM Elle L wrote: Medication: pantoprazole  (PROTONIX ) 40 MG tablet AND torsemide  (DEMADEX ) 20 MG tablet AND fenofibrate  160 MG tablet  Has the patient contacted their pharmacy? Yes  This is the patient's preferred pharmacy:   [facility] [place] [address] [place] [address] Phone: [phone] Fax: [phone]  Is this the correct pharmacy for this prescription? Yes  Has the prescription been filled recently? Yes  Is the patient out of the medication? No  Has the patient been seen for an appointment in the last year OR does the patient have an upcoming appointment? Yes  Can we respond through MyChart? No  Agent: Please be advised that Rx refills may take up to 3 business days. We ask that you follow-up with your pharmacy.

## 2023-12-13 ENCOUNTER — Other Ambulatory Visit: Payer: Self-pay | Admitting: Physician Assistant

## 2023-12-13 DIAGNOSIS — R600 Localized edema: Secondary | ICD-10-CM

## 2023-12-17 ENCOUNTER — Other Ambulatory Visit: Payer: Self-pay | Admitting: Physician Assistant

## 2023-12-17 DIAGNOSIS — R197 Diarrhea, unspecified: Secondary | ICD-10-CM | POA: Diagnosis not present

## 2023-12-22 ENCOUNTER — Ambulatory Visit (HOSPITAL_BASED_OUTPATIENT_CLINIC_OR_DEPARTMENT_OTHER)

## 2023-12-25 ENCOUNTER — Telehealth: Payer: Self-pay | Admitting: Family Medicine

## 2023-12-25 DIAGNOSIS — R600 Localized edema: Secondary | ICD-10-CM

## 2023-12-25 NOTE — Telephone Encounter (Unsigned)
 Copied from CRM [id number]. Topic: Clinical - Medication Refill >> Dec 25, 2023  3:12 PM Aaliyah S wrote: Medication: Montelukast  10 MG, Torsemide    Has the patient contacted their pharmacy? Yes (Agent: If no, request that the patient contact the pharmacy for the refill. If patient does not wish to contact the pharmacy document the reason why and proceed with request.) (Agent: If yes, when and what did the pharmacy advise?)    [facility] [place] [address] [place] [address] Phone: [phone] Fax: [phone]  Is this the correct pharmacy for this prescription? Yes If no, delete pharmacy and type the correct one.   Has the prescription been filled recently? No  Is the patient out of the medication? No, 2 days of supply  Has the patient been seen for an appointment in the last year OR does the patient have an upcoming appointment? Yes  Can we respond through MyChart? unsure  Agent: Please be advised that Rx refills may take up to 3 business days. We ask that you follow-up with your pharmacy.

## 2023-12-26 MED ORDER — TORSEMIDE 20 MG PO TABS: 20.0000 mg | ORAL_TABLET | Freq: Every day | ORAL | 2 refills | Status: DC

## 2023-12-26 MED ORDER — MONTELUKAST SODIUM 10 MG PO TABS: 10.0000 mg | ORAL_TABLET | Freq: Every day | ORAL | 2 refills | Status: DC

## 2023-12-29 ENCOUNTER — Ambulatory Visit (HOSPITAL_BASED_OUTPATIENT_CLINIC_OR_DEPARTMENT_OTHER)

## 2024-01-05 ENCOUNTER — Encounter (HOSPITAL_BASED_OUTPATIENT_CLINIC_OR_DEPARTMENT_OTHER): Payer: Self-pay

## 2024-01-05 ENCOUNTER — Ambulatory Visit (HOSPITAL_BASED_OUTPATIENT_CLINIC_OR_DEPARTMENT_OTHER)
Admission: RE | Admit: 2024-01-05 | Discharge: 2024-01-05 | Disposition: A | Discharge status: Home / Self Care | Source: Ambulatory Visit | Attending: Physician Assistant | Admitting: Physician Assistant

## 2024-01-05 DIAGNOSIS — G8929 Other chronic pain: Secondary | ICD-10-CM

## 2024-01-06 ENCOUNTER — Other Ambulatory Visit: Payer: Self-pay | Admitting: Family Medicine

## 2024-01-06 ENCOUNTER — Encounter: Payer: Self-pay | Admitting: Family Medicine

## 2024-01-06 ENCOUNTER — Telehealth: Payer: Self-pay

## 2024-01-06 ENCOUNTER — Other Ambulatory Visit: Payer: Self-pay

## 2024-01-06 DIAGNOSIS — F331 Major depressive disorder, recurrent, moderate: Secondary | ICD-10-CM

## 2024-01-06 DIAGNOSIS — M5416 Radiculopathy, lumbar region: Secondary | ICD-10-CM | POA: Diagnosis not present

## 2024-01-06 DIAGNOSIS — Z9689 Presence of other specified functional implants: Secondary | ICD-10-CM | POA: Diagnosis not present

## 2024-01-06 DIAGNOSIS — R600 Localized edema: Secondary | ICD-10-CM

## 2024-01-06 DIAGNOSIS — J441 Chronic obstructive pulmonary disease with (acute) exacerbation: Secondary | ICD-10-CM

## 2024-01-06 DIAGNOSIS — E782 Mixed hyperlipidemia: Secondary | ICD-10-CM

## 2024-01-06 DIAGNOSIS — K219 Gastro-esophageal reflux disease without esophagitis: Secondary | ICD-10-CM

## 2024-01-06 MED ORDER — AZELASTINE HCL 137 MCG/SPRAY NA SOLN: 2.0000 | Freq: Two times a day (BID) | NASAL | 3 refills | Status: AC

## 2024-01-06 MED ORDER — DICYCLOMINE HCL 20 MG PO TABS: 20.0000 mg | ORAL_TABLET | Freq: Four times a day (QID) | ORAL | 1 refills | Status: AC

## 2024-01-06 MED ORDER — ALBUTEROL SULFATE HFA 108 (90 BASE) MCG/ACT IN AERS: 2.0000 | INHALATION_SPRAY | Freq: Four times a day (QID) | RESPIRATORY_TRACT | 3 refills | Status: AC | PRN

## 2024-01-06 MED ORDER — MONTELUKAST SODIUM 10 MG PO TABS: 10.0000 mg | ORAL_TABLET | Freq: Every day | ORAL | 2 refills | Status: AC

## 2024-01-06 MED ORDER — FLUTICASONE PROPIONATE 50 MCG/ACT NA SUSP: 1.0000 | Freq: Every day | NASAL | 3 refills | Status: AC

## 2024-01-06 MED ORDER — DULOXETINE HCL 60 MG PO CPEP: 60.0000 mg | ORAL_CAPSULE | Freq: Every day | ORAL | 1 refills | Status: AC

## 2024-01-06 MED ORDER — CETIRIZINE HCL 10 MG PO TABS: ORAL_TABLET | ORAL | 2 refills | Status: AC

## 2024-01-06 MED ORDER — LUBIPROSTONE 8 MCG PO CAPS: 8.0000 ug | ORAL_CAPSULE | Freq: Every day | ORAL | 2 refills | Status: DC

## 2024-01-06 MED ORDER — GABAPENTIN 600 MG PO TABS: 600.0000 mg | ORAL_TABLET | Freq: Four times a day (QID) | ORAL | 1 refills | Status: DC

## 2024-01-06 MED ORDER — HYDROXYZINE PAMOATE 25 MG PO CAPS: 25.0000 mg | ORAL_CAPSULE | Freq: Three times a day (TID) | ORAL | 1 refills | Status: DC | PRN

## 2024-01-06 MED ORDER — HYDROCORTISONE 10 MG PO TABS: 10.0000 mg | ORAL_TABLET | Freq: Two times a day (BID) | ORAL | 1 refills | Status: AC

## 2024-01-06 MED ORDER — ASPIRIN 81 MG PO TBEC: 81.0000 mg | DELAYED_RELEASE_TABLET | Freq: Every day | ORAL | 3 refills | Status: AC

## 2024-01-06 MED ORDER — PRIMIDONE 50 MG PO TABS: 50.0000 mg | ORAL_TABLET | Freq: Two times a day (BID) | ORAL | 1 refills | Status: DC

## 2024-01-06 MED ORDER — ATORVASTATIN CALCIUM 80 MG PO TABS: 80.0000 mg | ORAL_TABLET | Freq: Every day | ORAL | 1 refills | Status: DC

## 2024-01-06 MED ORDER — TORSEMIDE 20 MG PO TABS: 20.0000 mg | ORAL_TABLET | Freq: Every day | ORAL | 1 refills | Status: DC

## 2024-01-06 MED ORDER — FENOFIBRATE 160 MG PO TABS: 160.0000 mg | ORAL_TABLET | Freq: Every day | ORAL | 1 refills | Status: DC

## 2024-01-06 MED ORDER — BUSPIRONE HCL 30 MG PO TABS: 30.0000 mg | ORAL_TABLET | Freq: Two times a day (BID) | ORAL | 1 refills | Status: DC

## 2024-01-06 MED ORDER — LEVOTHYROXINE SODIUM 75 MCG PO TABS: 75.0000 ug | ORAL_TABLET | Freq: Every day | ORAL | 1 refills | Status: DC

## 2024-01-06 MED ORDER — ARIPIPRAZOLE 5 MG PO TABS: 5.0000 mg | ORAL_TABLET | Freq: Every day | ORAL | 3 refills | Status: AC

## 2024-01-06 MED ORDER — SPIRIVA RESPIMAT 2.5 MCG/ACT IN AERS: INHALATION_SPRAY | RESPIRATORY_TRACT | 3 refills | Status: AC

## 2024-01-06 MED ORDER — GABAPENTIN 600 MG PO TABS: 600.0000 mg | ORAL_TABLET | Freq: Four times a day (QID) | ORAL | 0 refills | Status: DC

## 2024-01-06 MED ORDER — CYCLOBENZAPRINE HCL 10 MG PO TABS: 10.0000 mg | ORAL_TABLET | Freq: Three times a day (TID) | ORAL | 1 refills | Status: AC

## 2024-01-06 MED ORDER — PANTOPRAZOLE SODIUM 40 MG PO TBEC
40.0000 mg | DELAYED_RELEASE_TABLET | Freq: Two times a day (BID) | ORAL | 1 refills | Status: DC
Start: 2024-01-06 — End: 2024-02-12

## 2024-01-06 MED ORDER — VITAMIN D (ERGOCALCIFEROL) 1.25 MG (50000 UNIT) PO CAPS: 50000.0000 [IU] | ORAL_CAPSULE | ORAL | 3 refills | Status: AC

## 2024-01-06 MED ORDER — SYSTANE BALANCE 0.6 % OP SOLN: 1.0000 [drp] | Freq: Every day | OPHTHALMIC | 3 refills | Status: AC | PRN

## 2024-01-06 MED ORDER — BETHANECHOL CHLORIDE 25 MG PO TABS: 25.0000 mg | ORAL_TABLET | Freq: Two times a day (BID) | ORAL | 1 refills | Status: DC

## 2024-01-06 NOTE — Telephone Encounter (Signed)
 Called patient to let her know [facility] She was at the doctor office and she was not able to talk.   Copied from CRM #[id number]. Topic: Clinical - Prescription Issue >> Jan 06, 2024  8:49 AM Connie Coffey wrote: Reason for CRM: The patient called in stating [facility] is behind with their refills and she is now out of her gabapentin  (NEURONTIN ) 600 MG tablet. She said it was on automatic refills and she didn't realize until the last minute that she was completely out. She says she may stop using [facility] altogether and go back to [facility] as she never had any problems with them. Please assist patient further and if a prescription can be called in to her local pharmacy so she will not be without them for too [place]  [id number] [place], [place] - [address]  Phone: [phone] Fax: [phone]  Please assist patient further.

## 2024-01-06 NOTE — Telephone Encounter (Signed)
 Pt called reporting that she wants her prescriptions from [facility]. She says she does not want to use [facility].

## 2024-01-07 ENCOUNTER — Other Ambulatory Visit: Payer: Self-pay | Admitting: Physician Assistant

## 2024-01-07 DIAGNOSIS — E782 Mixed hyperlipidemia: Secondary | ICD-10-CM

## 2024-01-07 DIAGNOSIS — K219 Gastro-esophageal reflux disease without esophagitis: Secondary | ICD-10-CM

## 2024-01-07 DIAGNOSIS — R600 Localized edema: Secondary | ICD-10-CM

## 2024-01-09 ENCOUNTER — Other Ambulatory Visit: Payer: Self-pay | Admitting: Family Medicine

## 2024-01-09 DIAGNOSIS — R600 Localized edema: Secondary | ICD-10-CM

## 2024-01-15 ENCOUNTER — Ambulatory Visit (INDEPENDENT_AMBULATORY_CARE_PROVIDER_SITE_OTHER): Admitting: Family Medicine

## 2024-01-15 VITALS — BP 136/64 | HR 103 | Temp 98.0°F | Ht 61.5 in | Wt 144.0 lb

## 2024-01-15 DIAGNOSIS — R600 Localized edema: Secondary | ICD-10-CM | POA: Diagnosis not present

## 2024-01-15 DIAGNOSIS — E782 Mixed hyperlipidemia: Secondary | ICD-10-CM | POA: Diagnosis not present

## 2024-01-15 DIAGNOSIS — M62838 Other muscle spasm: Secondary | ICD-10-CM

## 2024-01-15 DIAGNOSIS — F1120 Opioid dependence, uncomplicated: Secondary | ICD-10-CM

## 2024-01-15 DIAGNOSIS — F5101 Primary insomnia: Secondary | ICD-10-CM | POA: Diagnosis not present

## 2024-01-15 DIAGNOSIS — F331 Major depressive disorder, recurrent, moderate: Secondary | ICD-10-CM | POA: Diagnosis not present

## 2024-01-15 DIAGNOSIS — I700 Atherosclerosis of aorta: Secondary | ICD-10-CM

## 2024-01-15 DIAGNOSIS — E271 Primary adrenocortical insufficiency: Secondary | ICD-10-CM | POA: Diagnosis not present

## 2024-01-15 MED ORDER — FUROSEMIDE 20 MG PO TABS: 20.0000 mg | ORAL_TABLET | Freq: Every day | ORAL | Status: DC

## 2024-01-15 NOTE — Progress Notes (Unsigned)
 Subjective:  Patient ID: Connie Coffey, female    DOB: Aug 08, 1955  Age: 68 y.o. MRN: [id number]  Chief Complaint  Patient presents with   Medical Management of Chronic Issues   Discussed the use of AI scribe software for clinical note transcription with the patient, who gave verbal consent to proceed.  History of Present Illness   Connie Coffey is a 68 year old female with adrenal insufficiency who presents with severe leg cramps and sweating.  Muscle cramps - Severe cramps in the left leg causing significant pain - Episodes last approximately two hours - Cramps frequently disrupt sleep - Cyclobenzaprine  10 mg three times daily with persistent symptoms - States she has issues with restless legs.  Diaphoresis - Severe sweating episodes, particularly when walking into stores - Sweating occurs almost daily for approximately two months - Described as 'water running down' her body - Episodes occur even without exertion  Sleep disturbance - Difficulty falling asleep, often awake until 3 AM - Inadequate rest due to poor sleep quality - Sleep disrupted by cats and her son, preventing daytime naps  Gastrointestinal symptoms - Severe diarrhea - Discontinued Amitiza  due to stomach problems - Continues to take Milk of Magnesia daily despite diarrhea - Dicyclomine  20 mg four times daily for stomach and colon spasms - No fever, chills, or bladder symptoms  Adrenal insufficiency management - Hydrocortisone  10 mg twice daily for adrenal insufficiency - Uncertainty regarding adequacy of current hydrocortisone  dosage  Substance use and dietary habits - Reduced smoking by using sugar-free Lifesaver mints - Drinks a gallon or more of tea daily         01/15/2024    2:20 PM 10/31/2023   10:42 AM 08/30/2023    8:30 AM 05/17/2023    9:01 AM 05/14/2023    9:10 AM  Depression screen PHQ 2/9  Decreased Interest 1 2 1 1  0  Down, Depressed, Hopeless 1 1 1 1 3   PHQ - 2 Score 2 3 2 2 3   Altered  sleeping 1 1 2 3 3   Tired, decreased energy 2 3 2 2 3   Change in appetite 1 1 0 0 3  Feeling bad or failure about yourself  1 1 0 0 1  Trouble concentrating 1 1 0 0 0  Moving slowly or fidgety/restless 1 1 1 1  0  Suicidal thoughts 0 0 0 0 0  PHQ-9 Score 9 11 7 8 13   Difficult doing work/chores Somewhat difficult Not difficult at all Somewhat difficult Somewhat difficult Somewhat difficult        01/15/2024    2:21 PM  Fall Risk   Falls in the past year? 1  Number falls in past yr: 1  Injury with Fall? 0  Follow up Falls evaluation completed    Patient Care Team: Sherre Clapper, MD as PCP - General (Family Medicine) Charlanne Groom, MD as PCP - Gastroenterology (Gastroenterology) Joshua Alm Hamilton, MD as Consulting Physician (Neurosurgery) Burt Fus, DPM as Consulting Physician (Podiatry) Marda General, MD as Consulting Physician (Urology) Ezzard Valaria LABOR, MD as Consulting Physician (Oncology) Nyle Rankin POUR, [facility] (Inactive) (Pharmacist) Vannie Elsie HERO, OD (Ophthalmology)   Review of Systems  Constitutional:  Positive for diaphoresis. Negative for chills, fatigue and fever.  HENT:  Negative for congestion, ear pain, rhinorrhea and sore throat.   Respiratory:  Negative for cough and shortness of breath.   Cardiovascular:  Negative for chest pain.  Gastrointestinal:  Negative for abdominal pain, constipation, diarrhea, nausea and  vomiting.  Genitourinary:  Negative for dysuria and urgency.  Musculoskeletal:  Positive for myalgias. Negative for back pain.  Neurological:  Negative for dizziness, weakness, light-headedness and headaches.  Psychiatric/Behavioral:  Negative for dysphoric mood. The patient is not nervous/anxious.     Current Outpatient Medications on File Prior to Visit  Medication Sig Dispense Refill   albuterol  (VENTOLIN  HFA) 108 (90 Base) MCG/ACT inhaler Inhale 2 puffs into the lungs every 6 (six) hours as needed for wheezing or shortness of breath. 54  each 3   ARIPiprazole  (ABILIFY ) 5 MG tablet Take 1 tablet (5 mg total) by mouth daily. 90 tablet 3   aspirin  EC 81 MG tablet Take 1 tablet (81 mg total) by mouth daily. Swallow whole. 90 tablet 3   atorvastatin  (LIPITOR) 80 MG tablet Take 1 tablet (80 mg total) by mouth daily. 90 tablet 1   Azelastine  HCl 137 MCG/SPRAY SOLN Place 2 sprays into the nose in the morning and at bedtime. 90 mL 3   bethanechol  (URECHOLINE ) 25 MG tablet Take 1 tablet (25 mg total) by mouth in the morning and at bedtime. 180 tablet 1   busPIRone  (BUSPAR ) 30 MG tablet Take 1 tablet (30 mg total) by mouth 2 (two) times daily. 180 tablet 1   cetirizine  (ZYRTEC ) 10 MG tablet TAKE 1 TABLET BY MOUTH EVERYDAY AT BEDTIME 90 tablet 2   cyclobenzaprine  (FLEXERIL ) 10 MG tablet Take 1 tablet (10 mg total) by mouth 3 (three) times daily. 270 tablet 1   dicyclomine  (BENTYL ) 20 MG tablet Take 1 tablet (20 mg total) by mouth 4 (four) times daily. 360 tablet 1   DULoxetine  (CYMBALTA ) 60 MG capsule Take 1 capsule (60 mg total) by mouth daily. 90 capsule 1   fenofibrate  160 MG tablet Take 1 tablet (160 mg total) by mouth daily. 90 tablet 1   fluticasone  (FLONASE ) 50 MCG/ACT nasal spray Place 1 spray into both nostrils daily. 48 mL 3   gabapentin  (NEURONTIN ) 600 MG tablet Take 1 tablet (600 mg total) by mouth 4 (four) times daily. 360 tablet 1   hydrocortisone  (CORTEF ) 10 MG tablet Take 1 tablet (10 mg total) by mouth 2 (two) times daily. 180 tablet 1   hydrOXYzine  (VISTARIL ) 25 MG capsule Take 1 capsule (25 mg total) by mouth every 8 (eight) hours as needed for anxiety or itching. 270 capsule 1   levothyroxine  (SYNTHROID ) 75 MCG tablet Take 1 tablet (75 mcg total) by mouth daily before breakfast. 90 tablet 1   montelukast  (SINGULAIR ) 10 MG tablet Take 1 tablet (10 mg total) by mouth daily. 100 tablet 2   Multiple Vitamin (MULTI-VITAMINS) TABS Take 1 tablet by mouth daily.     oxyCODONE -acetaminophen  (PERCOCET) 10-325 MG tablet Take 1 tablet  by mouth every 8 (eight) hours as needed for pain. 30 tablet 0   pantoprazole  (PROTONIX ) 40 MG tablet Take 1 tablet (40 mg total) by mouth 2 (two) times daily. 180 tablet 1   primidone  (MYSOLINE ) 50 MG tablet Take 1 tablet (50 mg total) by mouth 2 (two) times daily. 180 tablet 1   Propylene Glycol (SYSTANE BALANCE) 0.6 % SOLN Place 1 drop into both eyes daily as needed (dry eyes). 15 mL 3   Tiotropium Bromide Monohydrate  (SPIRIVA  RESPIMAT) 2.5 MCG/ACT AERS USE 1 INHALATION BY MOUTH  TWICE DAILY 12 g 3   Vitamin D , Ergocalciferol , (DRISDOL ) 1.25 MG (50000 UNIT) CAPS capsule Take 1 capsule (50,000 Units total) by mouth every Wednesday. 15 capsule 3   No  current facility-administered medications on file prior to visit.   Past Medical History:  Diagnosis Date   Adrenal insufficiency (HCC)    Anxiety    Arthritis    Carpal tunnel syndrome    Both hands, worse in right   Chronic constipation    COPD (chronic obstructive pulmonary disease) (HCC)    CVA (cerebral vascular accident) (HCC)    Depression    Facet syndrome    Fibromyalgia    GERD (gastroesophageal reflux disease)    Heart murmur    Hypercholesteremia    IBS (irritable bowel syndrome)    Ischemic stroke (HCC)    07/19/2004   Lumbar radiculopathy    Osteoporosis    Radiculopathy, lumbar region 05/02/2021   RLS (restless legs syndrome)    Spinal stenosis    Thyroid  disease    TMJ (temporomandibular joint disorder)    Trochanteric bursitis of both hips    Vascular malformation    spinal cord   Past Surgical History:  Procedure Laterality Date   APPENDECTOMY     CARPAL TUNNEL RELEASE Right 12/13/2017   surgery   CATARACT EXTRACTION Bilateral    CHOLECYSTECTOMY     COLONOSCOPY  05/06/2017   Colonic polyps status post polypectomy. Interan land external hemorrhoids.    ESOPHAGOGASTRODUODENOSCOPY  04/28/2007   Mild gastrtiis. Otherwise normal EGD.    HEMORROIDECTOMY     LAMINECTOMY AND MICRODISCECTOMY LUMBAR SPINE   01/03/2005   NECK SURGERY     x2    OTHER SURGICAL HISTORY  04/24/2004   anterior cervical discectomy and fusion at C5-C6   SPINAL CORD STIMULATOR INSERTION N/A 07/21/2021   Procedure: Permanant Spinal cord stimulator placement leads and battery under fluroscopy;  Surgeon: Darlis Deatrice RAMAN, MD;  [facility];  Service: Neurosurgery;  Laterality: N/A;   TONSILLECTOMY     TOTAL ABDOMINAL HYSTERECTOMY      Family History  Problem Relation Age of Onset   Supraventricular tachycardia Mother    Dementia Mother    Hyperlipidemia Mother    Hypertension Mother    Osteoarthritis Mother    Osteoporosis Mother    Breast cancer Other    Breast cancer Other    Colon cancer Neg Hx    Esophageal cancer Neg Hx    Social History   Socioeconomic History   Marital status: Divorced    Spouse name: Not on file   Number of children: 1   Years of education: Not on file   Highest education level: Not on file  Occupational History   Occupation: Disabled  Tobacco Use   Smoking status: Every Day    Current packs/day: 0.50    Types: Cigarettes   Smokeless tobacco: Never  Vaping Use   Vaping status: Never Used  Substance and Sexual Activity   Alcohol use: Not Currently    Comment: quit 19 years   Drug use: Never   Sexual activity: Not Currently  Other Topics Concern   Not on file  Social History Narrative   Disabled son lives with her sometimes and his father sometimes.  Haydin is working hard to have a relationship with her son's father and his wife.  They help her when needed.   Social Drivers of Corporate investment banker Strain: Low Risk  (05/14/2023)   Overall Financial Resource Strain (CARDIA)    Difficulty of Paying Living Expenses: Not hard at all  Food Insecurity: No Food Insecurity (05/14/2023)   Hunger Vital Sign    Worried About  Running Out of Food in the Last Year: Never true    Ran Out of Food in the Last Year: Never true  Transportation Needs: No Transportation Needs  (05/14/2023)   PRAPARE - Administrator, Civil Service (Medical): No    Lack of Transportation (Non-Medical): No  Physical Activity: Inactive (10/31/2023)   Exercise Vital Sign    Days of Exercise per Week: 0 days    Minutes of Exercise per Session: 0 min  Stress: Stress Concern Present (05/14/2023)   [employer] of Occupational Health - Occupational Stress Questionnaire    Feeling of Stress : Rather much  Social Connections: Socially Isolated (05/14/2023)   Social Connection and Isolation Panel    Frequency of Communication with Friends and Family: More than three times a week    Frequency of Social Gatherings with Friends and Family: More than three times a week    Attends Religious Services: Never    Database administrator or Organizations: No    Attends Engineer, structural: Never    Marital Status: Divorced    Objective:  BP 136/64   Pulse (!) 103   Temp 98 F (36.7 C)   Ht 5' 1.5 (1.562 m)   Wt 144 lb (65.3 kg)   SpO2 98%   BMI 26.77 kg/m      01/15/2024    2:16 PM 12/02/2023    2:58 PM 11/18/2023    1:54 PM  BP/Weight  Systolic BP 136 152 110  Diastolic BP 64 82 60  Wt. (Lbs) 144 144 146  BMI 26.77 kg/m2 26.77 kg/m2 27.14 kg/m2    Physical Exam Vitals reviewed.  Constitutional:      Appearance: Normal appearance. She is normal weight.  Neck:     Vascular: No carotid bruit.  Cardiovascular:     Rate and Rhythm: Normal rate and regular rhythm.     Heart sounds: Normal heart sounds.  Pulmonary:     Effort: Pulmonary effort is normal. No respiratory distress.     Breath sounds: Normal breath sounds.  Abdominal:     General: Abdomen is flat. Bowel sounds are normal.     Palpations: Abdomen is soft.     Tenderness: There is no abdominal tenderness.  Neurological:     Mental Status: She is alert and oriented to person, place, and time.  Psychiatric:        Mood and Affect: Mood normal.        Behavior: Behavior normal.          Lab Results  Component Value Date   WBC 11.0 (H) 01/15/2024   HGB 13.7 01/15/2024   HCT 42.8 01/15/2024   PLT 611 (H) 01/15/2024   GLUCOSE 96 01/15/2024   CHOL 162 08/30/2023   TRIG 133 08/30/2023   HDL 82 08/30/2023   LDLCALC 58 08/30/2023   ALT 19 01/15/2024   AST 31 01/15/2024   NA 142 01/15/2024   K 4.5 01/15/2024   CL 101 01/15/2024   CREATININE 0.86 01/15/2024   BUN 23 01/15/2024   CO2 25 01/15/2024   TSH 0.663 08/30/2023   HGBA1C 5.8 (H) 08/30/2023      Assessment & Plan:  Localized edema Assessment & Plan: Intermittent swelling due to prolonged sitting. - Hold Lasix , take 20 mg as needed for swelling due to muscle cramps.   Orders: -     Furosemide ; Take 1 tablet (20 mg total) by mouth daily.  Muscle spasm Assessment &  Plan: Severe cramps in left leg. - Hold Lasix , take 20 mg as needed for swelling to assess impact on cramping. - Check blood tests including magnesium level. - Consider non-pharmacological remedies such as apple cider vinegar.   Orders: -     Magnesium -     Phosphorus  Mixed hyperlipidemia Assessment & Plan: Labs drawn today Continue taking Fenofibrate  160mg , Lipitor 40mg ,  as directed Will adjust treatment depending on results   Orders: -     CBC with Differential/Platelet -     Comprehensive metabolic panel with GFR  Moderate recurrent major depression (HCC) Assessment & Plan: Continue on Abilify  to 5 mg daily.   Primary insomnia Assessment & Plan: The current medical regimen is effective;  continue present plan and medications.    Atherosclerosis of aorta [facility]) Assessment & Plan: The current medical regimen is effective;  continue present plan and medications.  Atorvastatin , fenofibrate .   Adrenal insufficiency (Addison's disease) (HCC) Assessment & Plan: Currently on hydrocortisone  10 mg twice daily Management per specialist.     Uncomplicated opioid dependence (HCC) Assessment & Plan: Sees pain  management.      Meds ordered this encounter  Medications   furosemide  (LASIX ) 20 MG tablet    Sig: Take 1 tablet (20 mg total) by mouth daily.    Orders Placed This Encounter  Procedures   CBC with Differential/Platelet   Comprehensive metabolic panel with GFR   Magnesium   Phosphorus     Follow-up: Return in about 3 months (around 04/16/2024) for chronic follow up.  I,Marla I Leal-Borjas,acting as a scribe for Abigail Free, MD.,have documented all relevant documentation on the behalf of Abigail Free, MD,as directed by  Abigail Free, MD while in the presence of Abigail Free, MD.    An After Visit Summary was printed and given to the patient.  Abigail Free, MD [facility] [phone]

## 2024-01-16 ENCOUNTER — Encounter (HOSPITAL_BASED_OUTPATIENT_CLINIC_OR_DEPARTMENT_OTHER): Payer: Self-pay

## 2024-01-16 LAB — COMPREHENSIVE METABOLIC PANEL WITH GFR
ALT: 19 IU/L (ref 0–32)
AST: 31 IU/L (ref 0–40)
Albumin: 4.6 g/dL (ref 3.9–4.9)
Alkaline Phosphatase: 44 IU/L (ref 44–121)
BUN/Creatinine Ratio: 27 (ref 12–28)
BUN: 23 mg/dL (ref 8–27)
Bilirubin Total: <0.2 mg/dL (ref 0.0–1.2)
CO2: 25 mmol/L (ref 20–29)
Calcium: 9.8 mg/dL (ref 8.7–10.3)
Chloride: 101 mmol/L (ref 96–106)
Creatinine, Ser: 0.86 mg/dL (ref 0.57–1.00)
Globulin, Total: 2.3 g/dL (ref 1.5–4.5)
Glucose: 96 mg/dL (ref 70–99)
Potassium: 4.5 mmol/L (ref 3.5–5.2)
Sodium: 142 mmol/L (ref 134–144)
Total Protein: 6.9 g/dL (ref 6.0–8.5)
eGFR: 73 mL/min/1.73 (ref >59)

## 2024-01-16 LAB — CBC WITH DIFFERENTIAL/PLATELET
Basophils Absolute: 0.1 x10E3/uL (ref 0.0–0.2)
Basos: 1 %
EOS (ABSOLUTE): 0.2 x10E3/uL (ref 0.0–0.4)
Eos: 1 %
Hematocrit: 42.8 % (ref 34.0–46.6)
Hemoglobin: 13.7 g/dL (ref 11.1–15.9)
Immature Grans (Abs): 0 x10E3/uL (ref 0.0–0.1)
Immature Granulocytes: 0 %
Lymphocytes Absolute: 3.1 x10E3/uL (ref 0.7–3.1)
Lymphs: 28 %
MCH: 28.2 pg (ref 26.6–33.0)
MCHC: 32 g/dL (ref 31.5–35.7)
MCV: 88 fL (ref 79–97)
Monocytes Absolute: 0.9 x10E3/uL (ref 0.1–0.9)
Monocytes: 8 %
Neutrophils Absolute: 6.8 x10E3/uL (ref 1.4–7.0)
Neutrophils: 62 %
Platelets: 611 x10E3/uL — ABNORMAL HIGH (ref 150–450)
RBC: 4.85 x10E6/uL (ref 3.77–5.28)
RDW: 14.1 % (ref 11.7–15.4)
WBC: 11 x10E3/uL — ABNORMAL HIGH (ref 3.4–10.8)

## 2024-01-16 LAB — PHOSPHORUS: Phosphorus: 4.3 mg/dL (ref 3.0–4.3)

## 2024-01-16 LAB — MAGNESIUM: Magnesium: 2.2 mg/dL (ref 1.6–2.3)

## 2024-01-16 NOTE — Addendum Note (Signed)
 Addended by: FOREST BOWLING A on: 01/16/2024 10:10 AM   Modules accepted: Orders

## 2024-01-18 DIAGNOSIS — M62838 Other muscle spasm: Secondary | ICD-10-CM | POA: Insufficient documentation

## 2024-01-18 NOTE — Assessment & Plan Note (Signed)
 The current medical regimen is effective;  continue present plan and medications.

## 2024-01-18 NOTE — Assessment & Plan Note (Signed)
 Labs drawn today Continue taking Fenofibrate  160mg , Lipitor 40mg ,  as directed Will adjust treatment depending on results

## 2024-01-18 NOTE — Assessment & Plan Note (Signed)
 Check labs

## 2024-01-18 NOTE — Assessment & Plan Note (Signed)
 Continue on Abilify  to 5 mg daily.

## 2024-01-18 NOTE — Assessment & Plan Note (Signed)
 Sent Furosemide .

## 2024-01-18 NOTE — Assessment & Plan Note (Signed)
 The current medical regimen is effective;  continue present plan and medications.  Atorvastatin , fenofibrate .

## 2024-01-19 ENCOUNTER — Ambulatory Visit: Payer: Self-pay | Admitting: Family Medicine

## 2024-01-20 ENCOUNTER — Encounter: Payer: Self-pay | Admitting: Family Medicine

## 2024-01-20 NOTE — Assessment & Plan Note (Signed)
Sees pain management

## 2024-01-20 NOTE — Assessment & Plan Note (Signed)
 Currently on hydrocortisone  10 mg twice daily Management per specialist.

## 2024-01-21 ENCOUNTER — Ambulatory Visit: Payer: Self-pay

## 2024-01-21 ENCOUNTER — Ambulatory Visit (INDEPENDENT_AMBULATORY_CARE_PROVIDER_SITE_OTHER): Admitting: Physician Assistant

## 2024-01-21 ENCOUNTER — Encounter: Payer: Self-pay | Admitting: Physician Assistant

## 2024-01-21 VITALS — BP 152/72 | HR 94 | Temp 97.8°F | Ht 61.5 in | Wt 142.0 lb

## 2024-01-21 DIAGNOSIS — G894 Chronic pain syndrome: Secondary | ICD-10-CM

## 2024-01-21 DIAGNOSIS — G2581 Restless legs syndrome: Secondary | ICD-10-CM

## 2024-01-21 DIAGNOSIS — M5432 Sciatica, left side: Secondary | ICD-10-CM | POA: Diagnosis not present

## 2024-01-21 DIAGNOSIS — D75839 Thrombocytosis, unspecified: Secondary | ICD-10-CM | POA: Diagnosis not present

## 2024-01-21 MED ORDER — TRIAMCINOLONE ACETONIDE 40 MG/ML IJ SUSP
80.0000 mg | Freq: Once | INTRAMUSCULAR | Status: AC
  Administered 2024-01-21: 80 mg via INTRAMUSCULAR

## 2024-01-21 NOTE — Progress Notes (Unsigned)
 Acute Office Visit  Subjective:    Patient ID: Connie Coffey, female    DOB: March 29, 1956, 68 y.o.   MRN: [id number]  Chief Complaint  Patient presents with   Cramping of left leg    HPI: Patient is in today for worsening left sided sciatica  Discussed the use of AI scribe software for clinical note transcription with the patient, who gave verbal consent to proceed.  History of Present Illness Connie Coffey is a 68 year old female with restless leg syndrome and sciatica who presents with persistent leg cramps.  She experiences severe leg cramps from the upper leg down to the toes, primarily affecting the left leg but also occurring on the right. The cramps are intense, lasting from 6 AM to 12 PM, and are not relieved by heat application, which previously provided relief. The pain is described as deep, 'in the bone,' and similar to the pain experienced when she broke her leg. The cramps feel like they are 'cramping underneath the kneecap' and are accompanied by numbness and tingling.  Her current medications include furosemide , which was switched from another diuretic, oxycodone , which does not alleviate the pain, and cyclobenzaprine , taken three times a day, which also does not provide relief. She is also taking gabapentin  600 mg four times a day for nerve pain.  She has a history of restless leg syndrome and sciatica. The leg cramps worsen with weather changes, particularly when it is cool at night. She has tried using a heating pad, which previously helped, but it is no longer effective. She denies any new rashes or redness on the legs.  She has a history of elevated platelet counts and is scheduled to see Dr. Ezzard at the cancer center for further evaluation. She has not experienced any new blood clots, and previous leg and vein studies have been conducted without significant findings.  She has undergone multiple back surgeries and has received injections for sciatica, which were painful  due to arthritis and damage in the area. She visits the pain clinic every three months and has had difficulty obtaining pain medication in the past due to clinic closures.  She moved her entire house by herself, which exacerbated her back issues.    Past Medical History:  Diagnosis Date   Adrenal insufficiency (HCC)    Anxiety    Arthritis    Carpal tunnel syndrome    Both hands, worse in right   Chronic constipation    COPD (chronic obstructive pulmonary disease) (HCC)    CVA (cerebral vascular accident) (HCC)    Depression    Facet syndrome    Fibromyalgia    GERD (gastroesophageal reflux disease)    Heart murmur    Hypercholesteremia    IBS (irritable bowel syndrome)    Ischemic stroke (HCC)    07/19/2004   Lumbar radiculopathy    Osteoporosis    Radiculopathy, lumbar region 05/02/2021   RLS (restless legs syndrome)    Spinal stenosis    Thyroid  disease    TMJ (temporomandibular joint disorder)    Trochanteric bursitis of both hips    Vascular malformation    spinal cord    Past Surgical History:  Procedure Laterality Date   APPENDECTOMY     CARPAL TUNNEL RELEASE Right 12/13/2017   surgery   CATARACT EXTRACTION Bilateral    CHOLECYSTECTOMY     COLONOSCOPY  05/06/2017   Colonic polyps status post polypectomy. Interan land external hemorrhoids.    ESOPHAGOGASTRODUODENOSCOPY  04/28/2007  Mild gastrtiis. Otherwise normal EGD.    HEMORROIDECTOMY     LAMINECTOMY AND MICRODISCECTOMY LUMBAR SPINE  01/03/2005   NECK SURGERY     x2    OTHER SURGICAL HISTORY  04/24/2004   anterior cervical discectomy and fusion at C5-C6   SPINAL CORD STIMULATOR INSERTION N/A 07/21/2021   Procedure: Permanant Spinal cord stimulator placement leads and battery under fluroscopy;  Surgeon: Darlis Deatrice RAMAN, MD;  Location: [facility] OR;  Service: Neurosurgery;  Laterality: N/A;   TONSILLECTOMY     TOTAL ABDOMINAL HYSTERECTOMY      Family History  Problem Relation Age of Onset    Supraventricular tachycardia Mother    Dementia Mother    Hyperlipidemia Mother    Hypertension Mother    Osteoarthritis Mother    Osteoporosis Mother    Breast cancer Other    Breast cancer Other    Colon cancer Neg Hx    Esophageal cancer Neg Hx     Social History   Socioeconomic History   Marital status: Divorced    Spouse name: Not on file   Number of children: 1   Years of education: Not on file   Highest education level: Not on file  Occupational History   Occupation: Disabled  Tobacco Use   Smoking status: Every Day    Current packs/day: 0.50    Types: Cigarettes   Smokeless tobacco: Never  Vaping Use   Vaping status: Never Used  Substance and Sexual Activity   Alcohol use: Not Currently    Comment: quit 19 years   Drug use: Never   Sexual activity: Not Currently  Other Topics Concern   Not on file  Social History Narrative   Disabled son lives with her sometimes and his father sometimes.  Connie Coffey is working hard to have a relationship with her son's father and his wife.  They help her when needed.   Social Drivers of Corporate investment banker Strain: Low Risk  (05/14/2023)   Overall Financial Resource Strain (CARDIA)    Difficulty of Paying Living Expenses: Not hard at all  Food Insecurity: No Food Insecurity (05/14/2023)   Hunger Vital Sign    Worried About Running Out of Food in the Last Year: Never true    Ran Out of Food in the Last Year: Never true  Transportation Needs: No Transportation Needs (05/14/2023)   PRAPARE - Administrator, Civil Service (Medical): No    Lack of Transportation (Non-Medical): No  Physical Activity: Inactive (10/31/2023)   Exercise Vital Sign    Days of Exercise per Week: 0 days    Minutes of Exercise per Session: 0 min  Stress: Stress Concern Present (05/14/2023)   [employer] of Occupational Health - Occupational Stress Questionnaire    Feeling of Stress : Rather much  Social Connections: Socially  Isolated (05/14/2023)   Social Connection and Isolation Panel    Frequency of Communication with Friends and Family: More than three times a week    Frequency of Social Gatherings with Friends and Family: More than three times a week    Attends Religious Services: Never    Database administrator or Organizations: No    Attends Banker Meetings: Never    Marital Status: Divorced  Catering manager Violence: Not At Risk (05/14/2023)   Humiliation, Afraid, Rape, and Kick questionnaire    Fear of Current or Ex-Partner: No    Emotionally Abused: No    Physically Abused: No  Sexually Abused: No    Outpatient Medications Prior to Visit  Medication Sig Dispense Refill   albuterol  (VENTOLIN  HFA) 108 (90 Base) MCG/ACT inhaler Inhale 2 puffs into the lungs every 6 (six) hours as needed for wheezing or shortness of breath. 54 each 3   ARIPiprazole  (ABILIFY ) 5 MG tablet Take 1 tablet (5 mg total) by mouth daily. 90 tablet 3   aspirin  EC 81 MG tablet Take 1 tablet (81 mg total) by mouth daily. Swallow whole. 90 tablet 3   atorvastatin  (LIPITOR) 80 MG tablet Take 1 tablet (80 mg total) by mouth daily. 90 tablet 1   Azelastine  HCl 137 MCG/SPRAY SOLN Place 2 sprays into the nose in the morning and at bedtime. 90 mL 3   bethanechol  (URECHOLINE ) 25 MG tablet Take 1 tablet (25 mg total) by mouth in the morning and at bedtime. 180 tablet 1   busPIRone  (BUSPAR ) 30 MG tablet Take 1 tablet (30 mg total) by mouth 2 (two) times daily. 180 tablet 1   cetirizine  (ZYRTEC ) 10 MG tablet TAKE 1 TABLET BY MOUTH EVERYDAY AT BEDTIME 90 tablet 2   cyclobenzaprine  (FLEXERIL ) 10 MG tablet Take 1 tablet (10 mg total) by mouth 3 (three) times daily. 270 tablet 1   dicyclomine  (BENTYL ) 20 MG tablet Take 1 tablet (20 mg total) by mouth 4 (four) times daily. 360 tablet 1   DULoxetine  (CYMBALTA ) 60 MG capsule Take 1 capsule (60 mg total) by mouth daily. 90 capsule 1   fenofibrate  160 MG tablet Take 1 tablet (160 mg  total) by mouth daily. 90 tablet 1   fluticasone  (FLONASE ) 50 MCG/ACT nasal spray Place 1 spray into both nostrils daily. 48 mL 3   furosemide  (LASIX ) 20 MG tablet Take 1 tablet (20 mg total) by mouth daily.     gabapentin  (NEURONTIN ) 600 MG tablet Take 1 tablet (600 mg total) by mouth 4 (four) times daily. 360 tablet 1   hydrocortisone  (CORTEF ) 10 MG tablet Take 1 tablet (10 mg total) by mouth 2 (two) times daily. 180 tablet 1   levothyroxine  (SYNTHROID ) 75 MCG tablet Take 1 tablet (75 mcg total) by mouth daily before breakfast. 90 tablet 1   montelukast  (SINGULAIR ) 10 MG tablet Take 1 tablet (10 mg total) by mouth daily. 100 tablet 2   Multiple Vitamin (MULTI-VITAMINS) TABS Take 1 tablet by mouth daily.     oxyCODONE -acetaminophen  (PERCOCET) 10-325 MG tablet Take 1 tablet by mouth every 8 (eight) hours as needed for pain. 30 tablet 0   pantoprazole  (PROTONIX ) 40 MG tablet Take 1 tablet (40 mg total) by mouth 2 (two) times daily. 180 tablet 1   primidone  (MYSOLINE ) 50 MG tablet Take 1 tablet (50 mg total) by mouth 2 (two) times daily. 180 tablet 1   Propylene Glycol (SYSTANE BALANCE) 0.6 % SOLN Place 1 drop into both eyes daily as needed (dry eyes). 15 mL 3   Tiotropium Bromide Monohydrate  (SPIRIVA  RESPIMAT) 2.5 MCG/ACT AERS USE 1 INHALATION BY MOUTH  TWICE DAILY 12 g 3   Vitamin D , Ergocalciferol , (DRISDOL ) 1.25 MG (50000 UNIT) CAPS capsule Take 1 capsule (50,000 Units total) by mouth every Wednesday. 15 capsule 3   hydrOXYzine  (VISTARIL ) 25 MG capsule Take 1 capsule (25 mg total) by mouth every 8 (eight) hours as needed for anxiety or itching. 270 capsule 1   No facility-administered medications prior to visit.    Allergies  Allergen Reactions   Beta Vulgaris Anaphylaxis    Beets    Penicillins  Shortness Of Breath   Cat Dander    Celecoxib Other (See Comments)    because of stomach ulcers   Chantix [Varenicline]     Makes pt sick on her stomach    Dog Epithelium (Canis Lupus  Familiaris)     Cats and dogs   Fish Oil Other (See Comments)    hemorrhoids   Gramineae Pollens    Molds & Smuts    Nsaids Other (See Comments)    GI Upset. Ulcers   Other Other (See Comments)    beets  Including Celebrex   Tape Dermatitis    Electrodes caused blistering   Nicotine Nausea Only and Other (See Comments)    Review of Systems  Constitutional:  Negative for appetite change, fatigue and fever.  HENT:  Negative for congestion, ear pain, sinus pressure and sore throat.   Respiratory:  Negative for cough, chest tightness, shortness of breath and wheezing.   Cardiovascular:  Negative for chest pain and palpitations.  Gastrointestinal:  Negative for abdominal pain, constipation, diarrhea, nausea and vomiting.  Genitourinary:  Negative for dysuria and hematuria.  Musculoskeletal:  Positive for myalgias (Cramping pain of left leg). Negative for arthralgias, back pain and joint swelling.  Skin:  Negative for rash.  Neurological:  Negative for dizziness, weakness and headaches.  Psychiatric/Behavioral:  Negative for dysphoric mood. The patient is not nervous/anxious.        Objective:        01/23/2024   11:15 AM 01/21/2024    3:31 PM 01/15/2024    2:16 PM  Vitals with BMI  Height 5' 1.5 5' 1.5 5' 1.5  Weight 144 lbs 142 lbs 144 lbs  BMI 26.77 26.4 26.77  Systolic 122 152 863  Diastolic 82 72 64  Pulse  94 103    No data found.    Physical Exam Vitals reviewed.  Constitutional:      Appearance: Normal appearance.  Neck:     Vascular: No carotid bruit.  Cardiovascular:     Rate and Rhythm: Normal rate and regular rhythm.     Heart sounds: Normal heart sounds.  Pulmonary:     Effort: Pulmonary effort is normal.     Breath sounds: Normal breath sounds.  Abdominal:     General: Bowel sounds are normal.     Palpations: Abdomen is soft.     Tenderness: There is no abdominal tenderness.  Musculoskeletal:     Comments: Pins and needles sensation running  down the left leg that has gotten worse at night.   Neurological:     Mental Status: She is alert and oriented to person, place, and time.  Psychiatric:        Mood and Affect: Mood normal.        Behavior: Behavior normal.     Health Maintenance Due  Topic Date Due   Zoster Vaccines- Shingrix (1 of 2) 05/04/2004   COVID-19 Vaccine (5 - 2024-25 season) 01/27/2023    There are no preventive care reminders to display for this patient.   Lab Results  Component Value Date   TSH 0.663 08/30/2023   Lab Results  Component Value Date   WBC 11.0 (H) 01/15/2024   HGB 13.7 01/15/2024   HCT 42.8 01/15/2024   MCV 88 01/15/2024   PLT 611 (H) 01/15/2024   Lab Results  Component Value Date   NA 142 01/15/2024   K 4.5 01/15/2024   CO2 25 01/15/2024   GLUCOSE 96 01/15/2024  BUN 23 01/15/2024   CREATININE 0.86 01/15/2024   BILITOT <0.2 01/15/2024   ALKPHOS 44 01/15/2024   AST 31 01/15/2024   ALT 19 01/15/2024   PROT 6.9 01/15/2024   ALBUMIN 4.6 01/15/2024   CALCIUM  9.8 01/15/2024   ANIONGAP 8 07/21/2021   EGFR 73 01/15/2024   Lab Results  Component Value Date   CHOL 162 08/30/2023   Lab Results  Component Value Date   HDL 82 08/30/2023   Lab Results  Component Value Date   LDLCALC 58 08/30/2023   Lab Results  Component Value Date   TRIG 133 08/30/2023   Lab Results  Component Value Date   CHOLHDL 2.0 08/30/2023   Lab Results  Component Value Date   HGBA1C 5.8 (H) 08/30/2023       Assessment & Plan:  Sciatica, left side Assessment & Plan: Chronic sciatica with severe pain and numbness indicating nerve involvement. Current pain management is ineffective. Differential includes nerve impingement or inflammation. - Administer Kenalog  injection to reduce nerve inflammation. - Consider Toradol injection for acute pain relief . - Order lumbar spine imaging to assess for changes since 2023. - Continue gabapentin  600 mg four times a day for nerve pain. - Advise  her to report if symptoms worsen or do not improve.  Orders: -     Triamcinolone  Acetonide  Restless legs syndrome Assessment & Plan: Restless legs syndrome exacerbated by weather changes, symptoms may overlap with sciatica.   Chronic pain syndrome Assessment & Plan: Chronic pain syndrome with severe pain affecting daily activities. Current regimen is ineffective. - Continue current pain management regimen with oxycodone  and cyclobenzaprine . - Consider alternative pain management strategies if current regimen remains ineffective.   Thrombocytosis Assessment & Plan: Thrombocytosis with elevated platelet count, concern for clotting risk. Kenalog  contraindicated if abnormal clotting occurs. - Will consider Kenalog  injection if pain continues.        Meds ordered this encounter  Medications   triamcinolone  acetonide (KENALOG -40) injection 80 mg    No orders of the defined types were placed in this encounter.    Follow-up: No follow-ups on file.  An After Visit Summary was printed and given to the patient.  I,Lauren M Auman,acting as a Neurosurgeon for [employer], PA.,have documented all relevant documentation on the behalf of Nola Angles, PA,as directed by  Nola Angles, PA while in the presence of Nola Angles, [place].    LILLETTE Kato I Leal-Borjas,acting as a scribe for Nola Angles, PA.,have documented all relevant documentation on the behalf of Nola Angles, PA,as directed by  Nola Angles, PA while in the presence of Nola Angles, [place].    Nola Angles, [place] [facility] [phone]

## 2024-01-21 NOTE — Telephone Encounter (Signed)
 FYI Only or Action Required?: FYI only for provider.  Patient was last seen in primary care on 01/15/2024 by Sherre Clapper, MD.  Called Nurse Triage reporting Leg Pain.  Symptoms began a week ago.  Interventions attempted: Prescription medications: Did not mention name of meds.  Symptoms are: gradually worsening.  Triage Disposition: See HCP Within 4 Hours (Or PCP Triage)  Patient/caregiver understands and will follow disposition?: Yes     Copied from CRM #[id number]. Topic: Clinical - Red Word Triage >> Jan 21, 2024 11:35 AM Debby BROCKS wrote: Red Word that prompted transfer to Nurse Triage: Patient saw Dr. Sherre last week with leg pain and its getting worse. Since 6am the cramps/pain are with the patient today Reason for Disposition  [1] SEVERE pain (e.g., excruciating, unable to do any normal activities) AND [2] not improved after 2 hours of pain medicine  Answer Assessment - Initial Assessment Questions Taking prescrpiton pain meds for symptoms. Patient states she has a hx of restless leg syndrome. Denies numbness. States she has some tingling and swelling in legs and her feet are also swollen.   1. ONSET: When did the pain start?      Over a week  2. LOCATION: Where is the pain located?      Patient states symptoms switches back and forth  3. PAIN: How bad is the pain?    (Scale 1-10; or mild, moderate, severe)     8/10 4. CAUSE: What do you think is causing the leg pain?     Unsure 5. OTHER SYMPTOMS: Do you have any other symptoms? (e.g., chest pain, back pain, breathing difficulty, swelling, rash, fever, numbness, weakness)     Hard to catch breath due to pain  Protocols used: Leg Pain-A-AH

## 2024-01-22 ENCOUNTER — Other Ambulatory Visit: Payer: Self-pay | Admitting: Family Medicine

## 2024-01-23 ENCOUNTER — Encounter: Payer: Self-pay | Admitting: Physician Assistant

## 2024-01-23 ENCOUNTER — Ambulatory Visit (INDEPENDENT_AMBULATORY_CARE_PROVIDER_SITE_OTHER): Admitting: Physician Assistant

## 2024-01-23 ENCOUNTER — Ambulatory Visit: Payer: Self-pay

## 2024-01-23 VITALS — BP 122/82 | Temp 97.1°F | Ht 61.5 in | Wt 144.0 lb

## 2024-01-23 DIAGNOSIS — D72828 Other elevated white blood cell count: Secondary | ICD-10-CM | POA: Diagnosis not present

## 2024-01-23 DIAGNOSIS — M5432 Sciatica, left side: Secondary | ICD-10-CM | POA: Diagnosis not present

## 2024-01-23 DIAGNOSIS — D75839 Thrombocytosis, unspecified: Secondary | ICD-10-CM | POA: Diagnosis not present

## 2024-01-23 MED ORDER — TRIAMCINOLONE ACETONIDE 40 MG/ML IJ SUSP: 60.0000 mg | Freq: Once | INTRAMUSCULAR | Status: DC

## 2024-01-23 MED ORDER — TRIAMCINOLONE ACETONIDE 40 MG/ML IJ SUSP
80.0000 mg | Freq: Once | INTRAMUSCULAR | Status: AC
  Administered 2024-01-23: 80 mg via INTRAMUSCULAR

## 2024-01-23 NOTE — Progress Notes (Unsigned)
 Acute Office Visit  Subjective:    Patient ID: Connie Coffey, female    DOB: 06/23/1955, 68 y.o.   MRN: [id number]  Chief Complaint  Patient presents with   Sciatica pain    HPI:  Discussed the use of AI scribe software for clinical note transcription with the patient, who gave verbal consent to proceed.  History of Present Illness Connie Coffey is a 68 year old female who presents with worsening pain in her left leg.  She experiences severe, relentless pain in her left leg, which has been persistent and unresponsive to Toradol. The pain intensified around 11 PM the previous night, impacting her ability to perform daily activities such as cooking and bending over.  She has a history of receiving injections for pain management, which were effective in the past. However, during a recent injection, a nerve was hit, causing significant discomfort and leading her to hesitate about further injections. She was previously receiving these injections every three months.  She experiences nausea, which she attributes to the pain and not having eaten while waiting for her husband to return home. She also reports difficulty wearing shoes due to swelling and has been unable to bend down easily due to back pain.  Her medical history includes high platelet and white blood cell counts, which have been consistently elevated.  Her social history includes preparing meals for her son, who is unable to cook for himself, and managing household responsibilities despite her pain. She wakes up early to assist her son with feeding their cats, as her back pain limits her ability to bend over.     Past Medical History:  Diagnosis Date   Adrenal insufficiency (HCC)    Anxiety    Arthritis    Carpal tunnel syndrome    Both hands, worse in right   Chronic constipation    COPD (chronic obstructive pulmonary disease) (HCC)    CVA (cerebral vascular accident) (HCC)    Depression    Facet syndrome     Fibromyalgia    GERD (gastroesophageal reflux disease)    Heart murmur    Hypercholesteremia    IBS (irritable bowel syndrome)    Ischemic stroke (HCC)    07/19/2004   Lumbar radiculopathy    Osteoporosis    Radiculopathy, lumbar region 05/02/2021   RLS (restless legs syndrome)    Spinal stenosis    Thyroid  disease    TMJ (temporomandibular joint disorder)    Trochanteric bursitis of both hips    Vascular malformation    spinal cord    Past Surgical History:  Procedure Laterality Date   APPENDECTOMY     CARPAL TUNNEL RELEASE Right 12/13/2017   surgery   CATARACT EXTRACTION Bilateral    CHOLECYSTECTOMY     COLONOSCOPY  05/06/2017   Colonic polyps status post polypectomy. Interan land external hemorrhoids.    ESOPHAGOGASTRODUODENOSCOPY  04/28/2007   Mild gastrtiis. Otherwise normal EGD.    HEMORROIDECTOMY     LAMINECTOMY AND MICRODISCECTOMY LUMBAR SPINE  01/03/2005   NECK SURGERY     x2    OTHER SURGICAL HISTORY  04/24/2004   anterior cervical discectomy and fusion at C5-C6   SPINAL CORD STIMULATOR INSERTION N/A 07/21/2021   Procedure: Permanant Spinal cord stimulator placement leads and battery under fluroscopy;  Surgeon: Darlis Deatrice RAMAN, MD;  Location: [facility] OR;  Service: Neurosurgery;  Laterality: N/A;   TONSILLECTOMY     TOTAL ABDOMINAL HYSTERECTOMY      Family History  Problem  Relation Age of Onset   Supraventricular tachycardia Mother    Dementia Mother    Hyperlipidemia Mother    Hypertension Mother    Osteoarthritis Mother    Osteoporosis Mother    Breast cancer Other    Breast cancer Other    Colon cancer Neg Hx    Esophageal cancer Neg Hx     Social History   Socioeconomic History   Marital status: Divorced    Spouse name: Not on file   Number of children: 1   Years of education: Not on file   Highest education level: Not on file  Occupational History   Occupation: Disabled  Tobacco Use   Smoking status: Every Day    Current packs/day: 0.50     Types: Cigarettes   Smokeless tobacco: Never  Vaping Use   Vaping status: Never Used  Substance and Sexual Activity   Alcohol use: Not Currently    Comment: quit 19 years   Drug use: Never   Sexual activity: Not Currently  Other Topics Concern   Not on file  Social History Narrative   Disabled son lives with her sometimes and his father sometimes.  Nevah is working hard to have a relationship with her son's father and his wife.  They help her when needed.   Social Drivers of Corporate investment banker Strain: Low Risk  (05/14/2023)   Overall Financial Resource Strain (CARDIA)    Difficulty of Paying Living Expenses: Not hard at all  Food Insecurity: No Food Insecurity (05/14/2023)   Hunger Vital Sign    Worried About Running Out of Food in the Last Year: Never true    Ran Out of Food in the Last Year: Never true  Transportation Needs: No Transportation Needs (05/14/2023)   PRAPARE - Administrator, Civil Service (Medical): No    Lack of Transportation (Non-Medical): No  Physical Activity: Inactive (10/31/2023)   Exercise Vital Sign    Days of Exercise per Week: 0 days    Minutes of Exercise per Session: 0 min  Stress: Stress Concern Present (05/14/2023)   [employer] of Occupational Health - Occupational Stress Questionnaire    Feeling of Stress : Rather much  Social Connections: Socially Isolated (05/14/2023)   Social Connection and Isolation Panel    Frequency of Communication with Friends and Family: More than three times a week    Frequency of Social Gatherings with Friends and Family: More than three times a week    Attends Religious Services: Never    Database administrator or Organizations: No    Attends Banker Meetings: Never    Marital Status: Divorced  Catering manager Violence: Not At Risk (05/14/2023)   Humiliation, Afraid, Rape, and Kick questionnaire    Fear of Current or Ex-Partner: No    Emotionally Abused: No    Physically  Abused: No    Sexually Abused: No    Outpatient Medications Prior to Visit  Medication Sig Dispense Refill   albuterol  (VENTOLIN  HFA) 108 (90 Base) MCG/ACT inhaler Inhale 2 puffs into the lungs every 6 (six) hours as needed for wheezing or shortness of breath. 54 each 3   ARIPiprazole  (ABILIFY ) 5 MG tablet Take 1 tablet (5 mg total) by mouth daily. 90 tablet 3   aspirin  EC 81 MG tablet Take 1 tablet (81 mg total) by mouth daily. Swallow whole. 90 tablet 3   atorvastatin  (LIPITOR) 80 MG tablet Take 1 tablet (80 mg total)  by mouth daily. 90 tablet 1   Azelastine  HCl 137 MCG/SPRAY SOLN Place 2 sprays into the nose in the morning and at bedtime. 90 mL 3   bethanechol  (URECHOLINE ) 25 MG tablet Take 1 tablet (25 mg total) by mouth in the morning and at bedtime. 180 tablet 1   busPIRone  (BUSPAR ) 30 MG tablet Take 1 tablet (30 mg total) by mouth 2 (two) times daily. 180 tablet 1   cetirizine  (ZYRTEC ) 10 MG tablet TAKE 1 TABLET BY MOUTH EVERYDAY AT BEDTIME 90 tablet 2   cyclobenzaprine  (FLEXERIL ) 10 MG tablet Take 1 tablet (10 mg total) by mouth 3 (three) times daily. 270 tablet 1   dicyclomine  (BENTYL ) 20 MG tablet Take 1 tablet (20 mg total) by mouth 4 (four) times daily. 360 tablet 1   DULoxetine  (CYMBALTA ) 60 MG capsule Take 1 capsule (60 mg total) by mouth daily. 90 capsule 1   fenofibrate  160 MG tablet Take 1 tablet (160 mg total) by mouth daily. 90 tablet 1   fluticasone  (FLONASE ) 50 MCG/ACT nasal spray Place 1 spray into both nostrils daily. 48 mL 3   furosemide  (LASIX ) 20 MG tablet Take 1 tablet (20 mg total) by mouth daily.     gabapentin  (NEURONTIN ) 600 MG tablet Take 1 tablet (600 mg total) by mouth 4 (four) times daily. 360 tablet 1   hydrocortisone  (CORTEF ) 10 MG tablet Take 1 tablet (10 mg total) by mouth 2 (two) times daily. 180 tablet 1   hydrOXYzine  (VISTARIL ) 25 MG capsule TAKE 1 CAPSULE (25 MG TOTAL) BY MOUTH EVERY 8 (EIGHT) HOURS AS NEEDED FOR ANXIETY OR ITCHING. 270 capsule 1    levothyroxine  (SYNTHROID ) 75 MCG tablet Take 1 tablet (75 mcg total) by mouth daily before breakfast. 90 tablet 1   montelukast  (SINGULAIR ) 10 MG tablet Take 1 tablet (10 mg total) by mouth daily. 100 tablet 2   Multiple Vitamin (MULTI-VITAMINS) TABS Take 1 tablet by mouth daily.     oxyCODONE -acetaminophen  (PERCOCET) 10-325 MG tablet Take 1 tablet by mouth every 8 (eight) hours as needed for pain. 30 tablet 0   pantoprazole  (PROTONIX ) 40 MG tablet Take 1 tablet (40 mg total) by mouth 2 (two) times daily. 180 tablet 1   primidone  (MYSOLINE ) 50 MG tablet Take 1 tablet (50 mg total) by mouth 2 (two) times daily. 180 tablet 1   Propylene Glycol (SYSTANE BALANCE) 0.6 % SOLN Place 1 drop into both eyes daily as needed (dry eyes). 15 mL 3   Tiotropium Bromide Monohydrate  (SPIRIVA  RESPIMAT) 2.5 MCG/ACT AERS USE 1 INHALATION BY MOUTH  TWICE DAILY 12 g 3   Vitamin D , Ergocalciferol , (DRISDOL ) 1.25 MG (50000 UNIT) CAPS capsule Take 1 capsule (50,000 Units total) by mouth every Wednesday. 15 capsule 3   No facility-administered medications prior to visit.    Allergies  Allergen Reactions   Beta Vulgaris Anaphylaxis    Beets    Penicillins Shortness Of Breath   Cat Dander    Celecoxib Other (See Comments)    because of stomach ulcers   Chantix [Varenicline]     Makes pt sick on her stomach    Dog Epithelium (Canis Lupus Familiaris)     Cats and dogs   Fish Oil Other (See Comments)    hemorrhoids   Gramineae Pollens    Molds & Smuts    Nsaids Other (See Comments)    GI Upset. Ulcers   Other Other (See Comments)    beets  Including Celebrex   Tape  Dermatitis    Electrodes caused blistering   Nicotine Nausea Only and Other (See Comments)    Review of Systems  Constitutional:  Negative for appetite change, fatigue and fever.  HENT:  Negative for congestion, ear pain, sinus pressure and sore throat.   Respiratory:  Negative for cough, chest tightness, shortness of breath and wheezing.    Cardiovascular:  Negative for chest pain and palpitations.  Gastrointestinal:  Negative for abdominal pain, constipation, diarrhea, nausea and vomiting.  Genitourinary:  Negative for dysuria and hematuria.  Musculoskeletal:  Positive for back pain (sciatica pain running down to both legs). Negative for arthralgias, joint swelling and myalgias.  Skin:  Negative for rash.  Neurological:  Negative for dizziness, weakness and headaches.  Psychiatric/Behavioral:  Negative for dysphoric mood. The patient is not nervous/anxious.        Objective:        01/23/2024   11:15 AM 01/21/2024    3:31 PM 01/15/2024    2:16 PM  Vitals with BMI  Height 5' 1.5 5' 1.5 5' 1.5  Weight 144 lbs 142 lbs 144 lbs  BMI 26.77 26.4 26.77  Systolic 122 152 863  Diastolic 82 72 64  Pulse  94 103    No data found.    Physical Exam Vitals reviewed.  Constitutional:      Appearance: Normal appearance.  Cardiovascular:     Rate and Rhythm: Normal rate and regular rhythm.     Heart sounds: Normal heart sounds.  Pulmonary:     Effort: Pulmonary effort is normal.     Breath sounds: Normal breath sounds.  Abdominal:     General: Bowel sounds are normal.     Palpations: Abdomen is soft.     Tenderness: There is no abdominal tenderness.  Musculoskeletal:        General: Tenderness present.  Neurological:     Mental Status: She is alert and oriented to person, place, and time.  Psychiatric:        Mood and Affect: Mood normal.        Behavior: Behavior normal.     Health Maintenance Due  Topic Date Due   Zoster Vaccines- Shingrix (1 of 2) 05/04/2004   COVID-19 Vaccine (5 - 2024-25 season) 01/27/2023    There are no preventive care reminders to display for this patient.   Lab Results  Component Value Date   TSH 0.663 08/30/2023   Lab Results  Component Value Date   WBC 11.0 (H) 01/15/2024   HGB 13.7 01/15/2024   HCT 42.8 01/15/2024   MCV 88 01/15/2024   PLT 611 (H) 01/15/2024   Lab  Results  Component Value Date   NA 142 01/15/2024   K 4.5 01/15/2024   CO2 25 01/15/2024   GLUCOSE 96 01/15/2024   BUN 23 01/15/2024   CREATININE 0.86 01/15/2024   BILITOT <0.2 01/15/2024   ALKPHOS 44 01/15/2024   AST 31 01/15/2024   ALT 19 01/15/2024   PROT 6.9 01/15/2024   ALBUMIN 4.6 01/15/2024   CALCIUM  9.8 01/15/2024   ANIONGAP 8 07/21/2021   EGFR 73 01/15/2024   Lab Results  Component Value Date   CHOL 162 08/30/2023   Lab Results  Component Value Date   HDL 82 08/30/2023   Lab Results  Component Value Date   LDLCALC 58 08/30/2023   Lab Results  Component Value Date   TRIG 133 08/30/2023   Lab Results  Component Value Date   CHOLHDL 2.0 08/30/2023  Lab Results  Component Value Date   HGBA1C 5.8 (H) 08/30/2023       Assessment & Plan:  Sciatica, left side Assessment & Plan: Left-sided sciatica. Previous Toradol injection was ineffective. Kenalog  injection deemed appropriate despite platelet concerns after consultation with Dr. Sherre and Dr. Ezzard. - Administer Kenalog  injection for pain relief . - Reschedule [facility] at [facility] for further evaluation.  Orders: -     Triamcinolone  Acetonide  Thrombocytosis Assessment & Plan: Chronic thrombocytosis with consistently elevated platelet counts. Awaiting consultation with hematologist Dr. Ezzard for evaluation and management. - Ensure referral to hematologist Dr. Ezzard is completed.   Other elevated white blood cell (WBC) count Assessment & Plan: Chronic leukocytosis with consistently elevated white blood cell counts. Further evaluation by hematologist is pending. - Ensure referral to hematologist Dr. Ezzard is completed.       Meds ordered this encounter  Medications   DISCONTD: triamcinolone  acetonide (KENALOG -40) injection 60 mg   triamcinolone  acetonide (KENALOG -40) injection 80 mg    No orders of the defined types were placed in this encounter.    Follow-up: No follow-ups on file.  An  After Visit Summary was printed and given to the patient.    I,Lauren M Auman,acting as a Neurosurgeon for [employer], PA.,have documented all relevant documentation on the behalf of Nola Angles, PA,as directed by  Nola Angles, PA while in the presence of Nola Angles, [place].    LILLETTE Kato I Leal-Borjas,acting as a scribe for Nola Angles, PA.,have documented all relevant documentation on the behalf of Nola Angles, PA,as directed by  Nola Angles, PA while in the presence of Nola Angles, [place].   Nola Angles, [place] [facility] [phone]

## 2024-01-23 NOTE — Telephone Encounter (Signed)
 FYI Only or Action Required?: Action required by provider: request for appointment.  Patient was last seen in primary care on 01/21/2024 by Milon Cleaves, PA.  Called Nurse Triage reporting Leg Pain.  Symptoms began a week ago.  Interventions attempted: Rest, hydration, or home remedies.  Symptoms are: gradually worsening.Seen this week for left leg pain. Last night started having right leg pain. Pt. Tearful.  Triage Disposition: See HCP Within 4 Hours (Or PCP Triage)  Patient/caregiver understands and will follow disposition?: Yes    Copied from CRM [id number]. Topic: Clinical - Red Word Triage >> Jan 23, 2024  9:33 AM Tobias CROME wrote: Red Word that prompted transfer to Nurse Triage: patient cramping in right leg now, cramps have been switching between legs Reason for Disposition  [1] SEVERE pain (e.g., excruciating, unable to do any normal activities) AND [2] not improved after 2 hours of pain medicine  Answer Assessment - Initial Assessment Questions 1. ONSET: When did the pain start?      Last night 2. LOCATION: Where is the pain located?      Right leg 3. PAIN: How bad is the pain?    (Scale 1-10; or mild, moderate, severe)     severe 4. WORK OR EXERCISE: Has there been any recent work or exercise that involved this part of the body?      no 5. CAUSE: What do you think is causing the leg pain?     unsure 6. OTHER SYMPTOMS: Do you have any other symptoms? (e.g., chest pain, back pain, breathing difficulty, swelling, rash, fever, numbness, weakness)     Back pain 7. PREGNANCY: Is there any chance you are pregnant? When was your last menstrual period?     no  Protocols used: Leg Pain-A-AH

## 2024-01-24 ENCOUNTER — Ambulatory Visit (HOSPITAL_COMMUNITY)

## 2024-01-24 ENCOUNTER — Telehealth: Payer: Self-pay | Admitting: Oncology

## 2024-01-24 NOTE — Telephone Encounter (Signed)
 01/24/24 Spoke with patient and confirmed appts.

## 2024-01-25 ENCOUNTER — Other Ambulatory Visit: Payer: Self-pay | Admitting: Family Medicine

## 2024-01-25 DIAGNOSIS — M5432 Sciatica, left side: Secondary | ICD-10-CM | POA: Insufficient documentation

## 2024-01-25 DIAGNOSIS — G2581 Restless legs syndrome: Secondary | ICD-10-CM | POA: Insufficient documentation

## 2024-01-25 DIAGNOSIS — R600 Localized edema: Secondary | ICD-10-CM

## 2024-01-25 DIAGNOSIS — D75839 Thrombocytosis, unspecified: Secondary | ICD-10-CM | POA: Insufficient documentation

## 2024-01-25 NOTE — Assessment & Plan Note (Signed)
 Restless legs syndrome exacerbated by weather changes, symptoms may overlap with sciatica.

## 2024-01-25 NOTE — Assessment & Plan Note (Signed)
 Chronic sciatica with severe pain and numbness indicating nerve involvement. Current pain management is ineffective. Differential includes nerve impingement or inflammation. - Administer Kenalog  injection to reduce nerve inflammation. - Consider Toradol injection for acute pain relief . - Order lumbar spine imaging to assess for changes since 2023. - Continue gabapentin  600 mg four times a day for nerve pain. - Advise her to report if symptoms worsen or do not improve.

## 2024-01-25 NOTE — Assessment & Plan Note (Signed)
 Thrombocytosis with elevated platelet count, concern for clotting risk. Kenalog  contraindicated if abnormal clotting occurs. - Will consider Kenalog  injection if pain continues.

## 2024-01-25 NOTE — Assessment & Plan Note (Signed)
 Chronic pain syndrome with severe pain affecting daily activities. Current regimen is ineffective. - Continue current pain management regimen with oxycodone  and cyclobenzaprine . - Consider alternative pain management strategies if current regimen remains ineffective.

## 2024-01-26 NOTE — Assessment & Plan Note (Signed)
 Left-sided sciatica. Previous Toradol injection was ineffective. Kenalog  injection deemed appropriate despite platelet concerns after consultation with Dr. Sherre and Dr. Ezzard. - Administer Kenalog  injection for pain relief . - Reschedule MRI at [facility] for further evaluation.

## 2024-01-26 NOTE — Assessment & Plan Note (Signed)
 Chronic thrombocytosis with consistently elevated platelet counts. Awaiting consultation with hematologist Dr. Ezzard for evaluation and management. - Ensure referral to hematologist Dr. Ezzard is completed.

## 2024-01-26 NOTE — Assessment & Plan Note (Signed)
 Chronic leukocytosis with consistently elevated white blood cell counts. Further evaluation by hematologist is pending. - Ensure referral to hematologist Dr. Ezzard is completed.

## 2024-01-28 ENCOUNTER — Encounter: Payer: Self-pay | Admitting: Family Medicine

## 2024-01-28 ENCOUNTER — Ambulatory Visit: Payer: Self-pay

## 2024-01-28 ENCOUNTER — Ambulatory Visit (INDEPENDENT_AMBULATORY_CARE_PROVIDER_SITE_OTHER): Admitting: Family Medicine

## 2024-01-28 VITALS — BP 128/68 | HR 94 | Temp 98.0°F | Resp 16 | Ht 61.5 in | Wt 143.0 lb

## 2024-01-28 DIAGNOSIS — M79605 Pain in left leg: Secondary | ICD-10-CM | POA: Diagnosis not present

## 2024-01-28 DIAGNOSIS — M5134 Other intervertebral disc degeneration, thoracic region: Secondary | ICD-10-CM

## 2024-01-28 DIAGNOSIS — M79604 Pain in right leg: Secondary | ICD-10-CM | POA: Diagnosis not present

## 2024-01-28 DIAGNOSIS — R600 Localized edema: Secondary | ICD-10-CM

## 2024-01-28 NOTE — Telephone Encounter (Signed)
 FYI Only or Action Required?: FYI only for provider.  Patient was last seen in primary care on 01/23/2024 by Milon Cleaves, PA.  Called Nurse Triage reporting Leg Pain.  Symptoms began 1-2 weeks ago.  Interventions attempted: Prescription medications: oxycodone , gabapentin , cyclobenzaprine , Buspar  and steroid injections in office.  Symptoms are: gradually worsening.  Triage Disposition: See HCP Within 4 Hours (Or PCP Triage)  Patient/caregiver understands and will follow disposition?: Yes                             Copied from CRM #[id number]. Topic: Clinical - Red Word Triage >> Jan 28, 2024 10:16 AM Connie Coffey wrote: Red Word that prompted transfer to Nurse Triage: Patient was seen 01/23/24, she received two shots but she is still having pain down her legs. Says office was suppose to reach out to Dr. Darlis at [facility] Reason for Disposition  [1] SEVERE pain (e.g., excruciating, unable to do any normal activities) AND [2] not improved after 2 hours of pain medicine  Answer Assessment - Initial Assessment Questions 1. ONSET: When did the pain start?      1-2 weeks, states pain is worsening/lingering longer since last OV 2. LOCATION: Where is the pain located?      Bilateral legs  3. PAIN: How bad is the pain?    (Scale 1-10; or mild, moderate, severe)     Rates pain 8-9, patient was tearful while on phone with this RN  5. CAUSE: What do you think is causing the leg pain?     States pain is stemming from back  6. OTHER SYMPTOMS: Do you have any other symptoms? (e.g., chest pain, back pain, breathing difficulty, swelling, rash, fever, numbness, weakness)     Tingling, difficulty walking, back pain, bilateral leg swelling at baseline, denies fever    Patient was last seen in office on 01/23/24. Patient received a steroid injection and denies relief. Patient states office was supposed to contact [facility] and is seeking an  update.  Protocols used: Leg Pain-A-AH

## 2024-01-28 NOTE — Progress Notes (Unsigned)
 Acute Office Visit  Subjective:    Patient ID: Connie Coffey, female    DOB: 03/10/1956, 68 y.o.   MRN: [id number]  Chief Complaint  Patient presents with   Leg Pain    Bi lateral    HPI: Patient is in today for ***  Past Medical History:  Diagnosis Date   Adrenal insufficiency (HCC)    Anxiety    Arthritis    Carpal tunnel syndrome    Both hands, worse in right   Chronic constipation    COPD (chronic obstructive pulmonary disease) (HCC)    CVA (cerebral vascular accident) (HCC)    Depression    Facet syndrome    Fibromyalgia    GERD (gastroesophageal reflux disease)    Heart murmur    Hypercholesteremia    IBS (irritable bowel syndrome)    Ischemic stroke (HCC)    07/19/2004   Lumbar radiculopathy    Osteoporosis    Radiculopathy, lumbar region 05/02/2021   RLS (restless legs syndrome)    Spinal stenosis    Thyroid  disease    TMJ (temporomandibular joint disorder)    Trochanteric bursitis of both hips    Vascular malformation    spinal cord    Past Surgical History:  Procedure Laterality Date   APPENDECTOMY     CARPAL TUNNEL RELEASE Right 12/13/2017   surgery   CATARACT EXTRACTION Bilateral    CHOLECYSTECTOMY     COLONOSCOPY  05/06/2017   Colonic polyps status post polypectomy. Interan land external hemorrhoids.    ESOPHAGOGASTRODUODENOSCOPY  04/28/2007   Mild gastrtiis. Otherwise normal EGD.    HEMORROIDECTOMY     LAMINECTOMY AND MICRODISCECTOMY LUMBAR SPINE  01/03/2005   NECK SURGERY     x2    OTHER SURGICAL HISTORY  04/24/2004   anterior cervical discectomy and fusion at C5-C6   SPINAL CORD STIMULATOR INSERTION N/A 07/21/2021   Procedure: Permanant Spinal cord stimulator placement leads and battery under fluroscopy;  Surgeon: Darlis Deatrice RAMAN, MD;  Location: [facility] OR;  Service: Neurosurgery;  Laterality: N/A;   TONSILLECTOMY     TOTAL ABDOMINAL HYSTERECTOMY      Family History  Problem Relation Age of Onset   Supraventricular tachycardia Mother     Dementia Mother    Hyperlipidemia Mother    Hypertension Mother    Osteoarthritis Mother    Osteoporosis Mother    Breast cancer Other    Breast cancer Other    Colon cancer Neg Hx    Esophageal cancer Neg Hx     Social History   Socioeconomic History   Marital status: Divorced    Spouse name: Not on file   Number of children: 1   Years of education: Not on file   Highest education level: Not on file  Occupational History   Occupation: Disabled  Tobacco Use   Smoking status: Every Day    Current packs/day: 0.50    Types: Cigarettes   Smokeless tobacco: Never  Vaping Use   Vaping status: Never Used  Substance and Sexual Activity   Alcohol use: Not Currently    Comment: quit 19 years   Drug use: Never   Sexual activity: Not Currently  Other Topics Concern   Not on file  Social History Narrative   Disabled son lives with her sometimes and his father sometimes.  Xitlalli is working hard to have a relationship with her son's father and his wife.  They help her when needed.   Social Drivers of Health  Financial Resource Strain: Low Risk  (05/14/2023)   Overall Financial Resource Strain (CARDIA)    Difficulty of Paying Living Expenses: Not hard at all  Food Insecurity: No Food Insecurity (05/14/2023)   Hunger Vital Sign    Worried About Running Out of Food in the Last Year: Never true    Ran Out of Food in the Last Year: Never true  Transportation Needs: No Transportation Needs (05/14/2023)   PRAPARE - Administrator, Civil Service (Medical): No    Lack of Transportation (Non-Medical): No  Physical Activity: Inactive (10/31/2023)   Exercise Vital Sign    Days of Exercise per Week: 0 days    Minutes of Exercise per Session: 0 min  Stress: Stress Concern Present (05/14/2023)   [employer] of Occupational Health - Occupational Stress Questionnaire    Feeling of Stress : Rather much  Social Connections: Socially Isolated (05/14/2023)   Social Connection  and Isolation Panel    Frequency of Communication with Friends and Family: More than three times a week    Frequency of Social Gatherings with Friends and Family: More than three times a week    Attends Religious Services: Never    Database administrator or Organizations: No    Attends Banker Meetings: Never    Marital Status: Divorced  Catering manager Violence: Not At Risk (05/14/2023)   Humiliation, Afraid, Rape, and Kick questionnaire    Fear of Current or Ex-Partner: No    Emotionally Abused: No    Physically Abused: No    Sexually Abused: No    Outpatient Medications Prior to Visit  Medication Sig Dispense Refill   albuterol  (VENTOLIN  HFA) 108 (90 Base) MCG/ACT inhaler Inhale 2 puffs into the lungs every 6 (six) hours as needed for wheezing or shortness of breath. 54 each 3   ARIPiprazole  (ABILIFY ) 5 MG tablet Take 1 tablet (5 mg total) by mouth daily. 90 tablet 3   aspirin  EC 81 MG tablet Take 1 tablet (81 mg total) by mouth daily. Swallow whole. 90 tablet 3   atorvastatin  (LIPITOR) 80 MG tablet Take 1 tablet (80 mg total) by mouth daily. 90 tablet 1   Azelastine  HCl 137 MCG/SPRAY SOLN Place 2 sprays into the nose in the morning and at bedtime. 90 mL 3   bethanechol  (URECHOLINE ) 25 MG tablet Take 1 tablet (25 mg total) by mouth in the morning and at bedtime. 180 tablet 1   busPIRone  (BUSPAR ) 30 MG tablet Take 1 tablet (30 mg total) by mouth 2 (two) times daily. 180 tablet 1   cetirizine  (ZYRTEC ) 10 MG tablet TAKE 1 TABLET BY MOUTH EVERYDAY AT BEDTIME 90 tablet 2   cyclobenzaprine  (FLEXERIL ) 10 MG tablet Take 1 tablet (10 mg total) by mouth 3 (three) times daily. 270 tablet 1   dicyclomine  (BENTYL ) 20 MG tablet Take 1 tablet (20 mg total) by mouth 4 (four) times daily. 360 tablet 1   DULoxetine  (CYMBALTA ) 60 MG capsule Take 1 capsule (60 mg total) by mouth daily. 90 capsule 1   fenofibrate  160 MG tablet Take 1 tablet (160 mg total) by mouth daily. 90 tablet 1    fluticasone  (FLONASE ) 50 MCG/ACT nasal spray Place 1 spray into both nostrils daily. 48 mL 3   furosemide  (LASIX ) 20 MG tablet Take 1 tablet (20 mg total) by mouth daily.     gabapentin  (NEURONTIN ) 600 MG tablet Take 1 tablet (600 mg total) by mouth 4 (four) times daily. 360 tablet  1   hydrocortisone  (CORTEF ) 10 MG tablet Take 1 tablet (10 mg total) by mouth 2 (two) times daily. 180 tablet 1   hydrOXYzine  (VISTARIL ) 25 MG capsule TAKE 1 CAPSULE (25 MG TOTAL) BY MOUTH EVERY 8 (EIGHT) HOURS AS NEEDED FOR ANXIETY OR ITCHING. 270 capsule 1   levothyroxine  (SYNTHROID ) 75 MCG tablet Take 1 tablet (75 mcg total) by mouth daily before breakfast. 90 tablet 1   montelukast  (SINGULAIR ) 10 MG tablet Take 1 tablet (10 mg total) by mouth daily. 100 tablet 2   Multiple Vitamin (MULTI-VITAMINS) TABS Take 1 tablet by mouth daily.     oxyCODONE -acetaminophen  (PERCOCET) 10-325 MG tablet Take 1 tablet by mouth every 8 (eight) hours as needed for pain. 30 tablet 0   pantoprazole  (PROTONIX ) 40 MG tablet Take 1 tablet (40 mg total) by mouth 2 (two) times daily. 180 tablet 1   primidone  (MYSOLINE ) 50 MG tablet Take 1 tablet (50 mg total) by mouth 2 (two) times daily. 180 tablet 1   Propylene Glycol (SYSTANE BALANCE) 0.6 % SOLN Place 1 drop into both eyes daily as needed (dry eyes). 15 mL 3   Tiotropium Bromide Monohydrate  (SPIRIVA  RESPIMAT) 2.5 MCG/ACT AERS USE 1 INHALATION BY MOUTH  TWICE DAILY 12 g 3   Vitamin D , Ergocalciferol , (DRISDOL ) 1.25 MG (50000 UNIT) CAPS capsule Take 1 capsule (50,000 Units total) by mouth every Wednesday. 15 capsule 3   No facility-administered medications prior to visit.    Allergies  Allergen Reactions   Beta Vulgaris Anaphylaxis    Beets    Penicillins Shortness Of Breath   Cat Dander    Celecoxib Other (See Comments)    because of stomach ulcers   Chantix [Varenicline]     Makes pt sick on her stomach    Dog Epithelium (Canis Lupus Familiaris)     Cats and dogs   Fish Oil  Other (See Comments)    hemorrhoids   Gramineae Pollens    Molds & Smuts    Nsaids Other (See Comments)    GI Upset. Ulcers   Other Other (See Comments)    beets  Including Celebrex   Tape Dermatitis    Electrodes caused blistering   Nicotine Nausea Only and Other (See Comments)    Review of Systems  Constitutional:  Negative for chills, diaphoresis, fatigue and fever.  HENT:  Negative for congestion, ear pain and sinus pain.   Eyes: Negative.   Respiratory:  Negative for cough and shortness of breath.   Cardiovascular:  Negative for chest pain and palpitations.  Gastrointestinal:  Positive for nausea. Negative for abdominal pain, constipation, diarrhea and vomiting.  Endocrine: Negative.   Genitourinary:  Negative for dysuria, frequency and urgency.  Musculoskeletal:  Negative for arthralgias.  Skin: Negative.   Allergic/Immunologic: Negative.   Neurological:  Positive for light-headedness. Negative for dizziness, weakness and headaches.  Psychiatric/Behavioral:  Negative for dysphoric mood. The patient is not nervous/anxious.        Objective:        01/28/2024    1:59 PM 01/23/2024   11:15 AM 01/21/2024    3:31 PM  Vitals with BMI  Height 5' 1.5 5' 1.5 5' 1.5  Weight 143 lbs 144 lbs 142 lbs  BMI 26.59 26.77 26.4  Systolic 128 122 847  Diastolic 68 82 72  Pulse 94  94    No data found.   Physical Exam  Health Maintenance Due  Topic Date Due   Zoster Vaccines- Shingrix (1 of  2) 05/04/2004   COVID-19 Vaccine (5 - 2025-26 season) 01/27/2024    There are no preventive care reminders to display for this patient.   Lab Results  Component Value Date   TSH 0.663 08/30/2023   Lab Results  Component Value Date   WBC 11.0 (H) 01/15/2024   HGB 13.7 01/15/2024   HCT 42.8 01/15/2024   MCV 88 01/15/2024   PLT 611 (H) 01/15/2024   Lab Results  Component Value Date   NA 142 01/15/2024   K 4.5 01/15/2024   CO2 25 01/15/2024   GLUCOSE 96 01/15/2024   BUN  23 01/15/2024   CREATININE 0.86 01/15/2024   BILITOT <0.2 01/15/2024   ALKPHOS 44 01/15/2024   AST 31 01/15/2024   ALT 19 01/15/2024   PROT 6.9 01/15/2024   ALBUMIN 4.6 01/15/2024   CALCIUM  9.8 01/15/2024   ANIONGAP 8 07/21/2021   EGFR 73 01/15/2024   Lab Results  Component Value Date   CHOL 162 08/30/2023   Lab Results  Component Value Date   HDL 82 08/30/2023   Lab Results  Component Value Date   LDLCALC 58 08/30/2023   Lab Results  Component Value Date   TRIG 133 08/30/2023   Lab Results  Component Value Date   CHOLHDL 2.0 08/30/2023   Lab Results  Component Value Date   HGBA1C 5.8 (H) 08/30/2023       Assessment & Plan:  There are no diagnoses linked to this encounter.   No orders of the defined types were placed in this encounter.   No orders of the defined types were placed in this encounter.    Follow-up: No follow-ups on file.  An After Visit Summary was printed and given to the patient.  Harrie CHRISTELLA Cedar, FNP [facility] [phone]

## 2024-01-29 DIAGNOSIS — R600 Localized edema: Secondary | ICD-10-CM | POA: Insufficient documentation

## 2024-01-29 DIAGNOSIS — M79604 Pain in right leg: Secondary | ICD-10-CM | POA: Insufficient documentation

## 2024-01-29 NOTE — Assessment & Plan Note (Signed)
 History of chronic back pain and spinal injections at [facility] by Dr. Darlis - Referral requested in order to expedite resumption of spinal injections

## 2024-01-29 NOTE — Assessment & Plan Note (Signed)
 Chronic neuropathic pain in lower extremities with chronic back pain (post-traumatic and degenerative) status post spinal surgery with hardware Chronic neuropathic pain and back pain exacerbated by recent fall. Pain management complicated by medication tolerance and side effects. Awaiting [facility] referral. - Coordinate with Dr. Sherre for [facility] referral. - Advised against steroid injection to prevent interference with specialist's future interventions. - Discussed alternative pain management: lidocaine  patches, heating pads. - Consider non-pharmacological interventions: CBD cream.

## 2024-01-29 NOTE — Assessment & Plan Note (Signed)
 Lower extremity edema Persistent edema managed with diuretics.

## 2024-02-07 ENCOUNTER — Ambulatory Visit: Admitting: Family Medicine

## 2024-02-12 ENCOUNTER — Other Ambulatory Visit: Payer: Self-pay

## 2024-02-12 ENCOUNTER — Inpatient Hospital Stay

## 2024-02-12 ENCOUNTER — Inpatient Hospital Stay: Admitting: Oncology

## 2024-02-12 DIAGNOSIS — R600 Localized edema: Secondary | ICD-10-CM

## 2024-02-12 DIAGNOSIS — K219 Gastro-esophageal reflux disease without esophagitis: Secondary | ICD-10-CM

## 2024-02-12 MED ORDER — PANTOPRAZOLE SODIUM 40 MG PO TBEC: 40.0000 mg | DELAYED_RELEASE_TABLET | Freq: Two times a day (BID) | ORAL | 1 refills | Status: DC

## 2024-02-12 MED ORDER — FUROSEMIDE 20 MG PO TABS: 20.0000 mg | ORAL_TABLET | Freq: Every day | ORAL | 1 refills | Status: DC

## 2024-02-15 ENCOUNTER — Other Ambulatory Visit: Payer: Self-pay | Admitting: Family Medicine

## 2024-02-17 DIAGNOSIS — M5416 Radiculopathy, lumbar region: Secondary | ICD-10-CM | POA: Diagnosis not present

## 2024-02-20 ENCOUNTER — Ambulatory Visit (INDEPENDENT_AMBULATORY_CARE_PROVIDER_SITE_OTHER)

## 2024-02-20 DIAGNOSIS — Z23 Encounter for immunization: Secondary | ICD-10-CM | POA: Diagnosis not present

## 2024-02-24 ENCOUNTER — Other Ambulatory Visit: Payer: Self-pay

## 2024-02-24 ENCOUNTER — Encounter: Payer: Self-pay | Admitting: Oncology

## 2024-02-25 ENCOUNTER — Telehealth: Payer: Self-pay | Admitting: Oncology

## 2024-02-25 ENCOUNTER — Other Ambulatory Visit: Payer: Self-pay | Admitting: Oncology

## 2024-02-25 ENCOUNTER — Inpatient Hospital Stay (HOSPITAL_BASED_OUTPATIENT_CLINIC_OR_DEPARTMENT_OTHER): Admitting: Oncology

## 2024-02-25 ENCOUNTER — Inpatient Hospital Stay: Attending: Oncology

## 2024-02-25 VITALS — BP 114/76 | HR 104 | Temp 98.4°F | Resp 14 | Ht 61.5 in | Wt 141.3 lb

## 2024-02-25 DIAGNOSIS — F17200 Nicotine dependence, unspecified, uncomplicated: Secondary | ICD-10-CM | POA: Diagnosis not present

## 2024-02-25 DIAGNOSIS — D75839 Thrombocytosis, unspecified: Secondary | ICD-10-CM

## 2024-02-25 DIAGNOSIS — E611 Iron deficiency: Secondary | ICD-10-CM | POA: Insufficient documentation

## 2024-02-25 DIAGNOSIS — D509 Iron deficiency anemia, unspecified: Secondary | ICD-10-CM

## 2024-02-25 DIAGNOSIS — D72829 Elevated white blood cell count, unspecified: Secondary | ICD-10-CM | POA: Diagnosis not present

## 2024-02-25 DIAGNOSIS — D508 Other iron deficiency anemias: Secondary | ICD-10-CM | POA: Diagnosis not present

## 2024-02-25 LAB — CBC WITH DIFFERENTIAL (CANCER CENTER ONLY)
Abs Immature Granulocytes: 0.1 K/uL — ABNORMAL HIGH (ref 0.00–0.07)
Basophils Absolute: 0.1 K/uL (ref 0.0–0.1)
Basophils Relative: 1 %
Eosinophils Absolute: 0.3 K/uL (ref 0.0–0.5)
Eosinophils Relative: 2 %
HCT: 44.8 % (ref 36.0–46.0)
Hemoglobin: 13.7 g/dL (ref 12.0–15.0)
Immature Granulocytes: 1 %
Lymphocytes Relative: 19 %
Lymphs Abs: 2.8 K/uL (ref 0.7–4.0)
MCH: 26.3 pg (ref 26.0–34.0)
MCHC: 30.6 g/dL (ref 30.0–36.0)
MCV: 86 fL (ref 80.0–100.0)
Monocytes Absolute: 1 K/uL (ref 0.1–1.0)
Monocytes Relative: 7 %
Neutro Abs: 10.2 K/uL — ABNORMAL HIGH (ref 1.7–7.7)
Neutrophils Relative %: 70 %
Platelet Count: 541 K/uL — ABNORMAL HIGH (ref 150–400)
RBC: 5.21 MIL/uL — ABNORMAL HIGH (ref 3.87–5.11)
RDW: 16.3 % — ABNORMAL HIGH (ref 11.5–15.5)
WBC Count: 14.5 K/uL — ABNORMAL HIGH (ref 4.0–10.5)
nRBC: 0 % (ref 0.0–0.2)

## 2024-02-25 LAB — IRON AND TIBC
Iron: 44 ug/dL (ref 28–170)
Saturation Ratios: 7 % — ABNORMAL LOW (ref 10.4–31.8)
TIBC: 636 ug/dL — ABNORMAL HIGH (ref 250–450)
UIBC: 592 ug/dL

## 2024-02-25 LAB — FERRITIN: Ferritin: 17 ng/mL (ref 11–307)

## 2024-02-25 NOTE — Progress Notes (Unsigned)
  [facility]  [address] [place],  [place]  [address] [phone]  Clinic Day: 11/03/2021  Referring physician: Sherre Clapper, MD  HISTORY OF PRESENT ILLNESS:  The patient is a 68 y.o. female with chronic leukocytosis and thrombocythemia.  Extensive lab work in the past did not reveal any evidence of a myeloproliferative disorder being present.  She comes in today to reassess her peripheral counts.  Since her last visit, the patient has been doing okay.  As it pertains to her peripheral counts, she denies having any B symptoms which concern her for her elevated counts being due to an underlying hematologic malignancy.   PHYSICAL EXAM:  There were no vitals taken for this visit. Wt Readings from Last 3 Encounters:  01/28/24 143 lb (64.9 kg)  01/23/24 144 lb (65.3 kg)  01/21/24 142 lb (64.4 kg)   There is no height or weight on file to calculate BMI. Performance status (ECOG): 1 - Symptomatic but completely ambulatory Physical Exam Constitutional:      Appearance: Normal appearance. She is not ill-appearing.  HENT:     Mouth/Throat:     Mouth: Mucous membranes are moist.     Pharynx: Oropharynx is clear. No oropharyngeal exudate or posterior oropharyngeal erythema.  Cardiovascular:     Rate and Rhythm: Normal rate and regular rhythm.     Heart sounds: No murmur heard.    No friction rub. No gallop.  Pulmonary:     Effort: Pulmonary effort is normal. No respiratory distress.     Breath sounds: Normal breath sounds. No wheezing, rhonchi or rales.  Abdominal:     General: Bowel sounds are normal. There is no distension.     Palpations: Abdomen is soft. There is no mass.     Tenderness: There is no abdominal tenderness.  Musculoskeletal:        General: No swelling.     Right lower leg: No edema.     Left lower leg: No edema.  Lymphadenopathy:     Cervical: No cervical adenopathy.     Upper Body:     Right upper body: No supraclavicular or  axillary adenopathy.     Left upper body: No supraclavicular or axillary adenopathy.     Lower Body: No right inguinal adenopathy. No left inguinal adenopathy.  Skin:    General: Skin is warm.     Coloration: Skin is not jaundiced.     Findings: No lesion or rash.  Neurological:     General: No focal deficit present.     Mental Status: She is alert and oriented to person, place, and time. Mental status is at baseline.  Psychiatric:        Mood and Affect: Mood normal.        Behavior: Behavior normal.        Thought Content: Thought content normal.    LABS:    ASSESSMENT & PLAN:  A 68 y.o. female with leukocytosis and thrombocythemia.  Although her white cells and platelets remain elevated, they are lower than what they have been previously.  This is encouraging for a bone marrow process likely not being present.  Clinically, she appears to be doing well.   I will see her back in 6 months for repeat clinical assessment.  The patient understands all the plans discussed today and is in agreement with them.  Erling Arrazola DELENA Kerns, MD

## 2024-02-25 NOTE — Telephone Encounter (Signed)
 Patient has been scheduled for follow-up visit per 02/25/24 LOS.  Pt given an appt calendar with date and time.

## 2024-02-26 ENCOUNTER — Encounter: Payer: Self-pay | Admitting: Oncology

## 2024-02-26 ENCOUNTER — Telehealth: Payer: Self-pay | Admitting: Oncology

## 2024-02-26 ENCOUNTER — Telehealth: Payer: Self-pay

## 2024-02-26 DIAGNOSIS — D509 Iron deficiency anemia, unspecified: Secondary | ICD-10-CM | POA: Insufficient documentation

## 2024-02-26 NOTE — Progress Notes (Signed)
 [facility] [address] [place],  [place]  [address] [phone]  Clinic Day:  02/25/2024  Referring physician: Sherre Clapper, MD   HISTORY OF PRESENT ILLNESS:  The patient is a 68 y.o. female with chronic leukocytosis and thrombocythemia.  Extensive lab work in the past did not reveal any evidence of a myeloproliferative disorder being present.  She comes in today to reestablish her hematologic care here, with a primary goal being to reassess her peripheral counts.  Since her last visit, the patient has been doing okay.  As it pertains to her peripheral counts, she denies having any B symptoms which concern her for her elevated counts being due to an underlying hematologic malignancy.  Of note, she continues to smoke heavily, which she has been doing for 50 years.   PHYSICAL EXAM:  Blood pressure 114/76, pulse (!) 104, temperature 98.4 F (36.9 C), temperature source Oral, resp. rate 14, height 5' 1.5 (1.562 m), weight 141 lb 4.8 oz (64.1 kg), SpO2 94%. Wt Readings from Last 3 Encounters:  02/25/24 141 lb 4.8 oz (64.1 kg)  01/28/24 143 lb (64.9 kg)  01/23/24 144 lb (65.3 kg)   Body mass index is 26.27 kg/m. Performance status (ECOG): 1 - Symptomatic but completely ambulatory Physical Exam Constitutional:      Appearance: Normal appearance. She is not ill-appearing.  HENT:     Mouth/Throat:     Mouth: Mucous membranes are moist.     Pharynx: Oropharynx is clear. No oropharyngeal exudate or posterior oropharyngeal erythema.  Cardiovascular:     Rate and Rhythm: Normal rate and regular rhythm.     Heart sounds: No murmur heard.    No friction rub. No gallop.  Pulmonary:     Effort: Pulmonary effort is normal. No respiratory distress.     Breath sounds: Normal breath sounds. No wheezing, rhonchi or rales.  Abdominal:     General: Bowel sounds are normal. There is no distension.     Palpations: Abdomen is soft. There is no mass.     Tenderness:  There is no abdominal tenderness.  Musculoskeletal:        General: No swelling.     Right lower leg: No edema.     Left lower leg: No edema.  Lymphadenopathy:     Cervical: No cervical adenopathy.     Upper Body:     Right upper body: No supraclavicular or axillary adenopathy.     Left upper body: No supraclavicular or axillary adenopathy.     Lower Body: No right inguinal adenopathy. No left inguinal adenopathy.  Skin:    General: Skin is warm.     Coloration: Skin is not jaundiced.     Findings: No lesion or rash.  Neurological:     General: No focal deficit present.     Mental Status: She is alert and oriented to person, place, and time. Mental status is at baseline.  Psychiatric:        Mood and Affect: Mood normal.        Behavior: Behavior normal.        Thought Content: Thought content normal.     LABS:      Latest Ref Rng & Units 02/25/2024    2:38 PM 01/15/2024    3:27 PM 09/05/2023    9:57 AM  CBC  WBC 4.0 - 10.5 K/uL 14.5  11.0  11.3   Hemoglobin 12.0 - 15.0 g/dL 86.2  86.2  13.9  Hematocrit 36.0 - 46.0 % 44.8  42.8  41.3   Platelets 150 - 400 K/uL 541  611  467       Latest Ref Rng & Units 01/15/2024    3:27 PM 08/30/2023    9:41 AM 05/17/2023    9:40 AM  CMP  Glucose 70 - 99 mg/dL 96  897  94   BUN 8 - 27 mg/dL 23  12  12    Creatinine 0.57 - 1.00 mg/dL 9.13  9.16  9.13   Sodium 134 - 144 mmol/L 142  143  141   Potassium 3.5 - 5.2 mmol/L 4.5  4.1  4.4   Chloride 96 - 106 mmol/L 101  103  99   CO2 20 - 29 mmol/L 25  26  27    Calcium  8.7 - 10.3 mg/dL 9.8  9.3  9.2   Total Protein 6.0 - 8.5 g/dL 6.9  6.6  6.6   Total Bilirubin 0.0 - 1.2 mg/dL <9.7  0.3  0.2   Alkaline Phos 44 - 121 IU/L 44  49  51   AST 0 - 40 IU/L 31  18  24    ALT 0 - 32 IU/L 19  16  22      Latest Reference Range & Units 02/25/24 14:38  Iron 28 - 170 ug/dL 44  UIBC ug/dL 407  TIBC 749 - 549 ug/dL 363 (H)  Saturation Ratios 10.4 - 31.8 % 7 (L)  Ferritin 11 - 307 ng/mL 17  (H): Data  is abnormally high (L): Data is abnormally low  ASSESSMENT & PLAN:  Assessment/Plan:  A 68 y.o. female with leukocytosis and thrombocythemia.  I definitely believe her leukocytosis is due to her chronic smoking.  She knows it is imperative for her to stop smoking in order to prevent a severe cardiovascular insult from developing in her future.  With respect to her elevated platelets, her iron parameters today do show evidence of her being iron deficient.  It is not uncommon for reactive thrombocythemia to develop in the setting of baseline iron deficiency.  I will arrange for her to receive IV iron in the forthcoming weeks to refortify her iron stores, which will hopefully lead to a normalization of her platelets.  I will see this patient back in 4 months to reassess her peripheral counts.  The patient understands all the plans discussed today and is in agreement with them.      Nastasia Kage DELENA Kerns, MD

## 2024-02-26 NOTE — Telephone Encounter (Signed)
 Dr Ezzard: Let pt know she is iron deficient and needs IV iron. Please schedule as soon as possible. thx    Latest Reference Range & Units 02/25/24 14:38  Iron 28 - 170 ug/dL 44  UIBC ug/dL 407  TIBC 749 - 549 ug/dL 363 (H)  Saturation Ratios 10.4 - 31.8 % 7 (L)  Ferritin 11 - 307 ng/mL 17  (H): Data is abnormally high (L): Data is abnormally low

## 2024-02-26 NOTE — Telephone Encounter (Signed)
 Patient has been scheduled. Aware of appt date and time.    Scheduling Message Entered by [place], AMY W on 02/26/2024 at  2:18 PM Priority: High INFUSION 1HR30MIN (90)  Department: [facility]  Provider: Ezzard Valaria LABOR, MD  Appointment Notes:  Needs IV iron per Ezzard

## 2024-02-27 ENCOUNTER — Inpatient Hospital Stay: Attending: Oncology

## 2024-02-27 VITALS — BP 137/86 | HR 92 | Temp 98.3°F | Resp 18

## 2024-02-27 DIAGNOSIS — D696 Thrombocytopenia, unspecified: Secondary | ICD-10-CM | POA: Insufficient documentation

## 2024-02-27 DIAGNOSIS — E611 Iron deficiency: Secondary | ICD-10-CM | POA: Insufficient documentation

## 2024-02-27 DIAGNOSIS — D509 Iron deficiency anemia, unspecified: Secondary | ICD-10-CM

## 2024-02-27 DIAGNOSIS — D72829 Elevated white blood cell count, unspecified: Secondary | ICD-10-CM | POA: Diagnosis not present

## 2024-02-27 MED ORDER — SODIUM CHLORIDE 0.9 % IV SOLN
510.0000 mg | Freq: Once | INTRAVENOUS | Status: AC
  Administered 2024-02-27: 510 mg via INTRAVENOUS
  Filled 2024-02-27: qty 510

## 2024-02-27 MED ORDER — LORATADINE 10 MG PO TABS
10.0000 mg | ORAL_TABLET | Freq: Once | ORAL | Status: DC
  Filled 2024-02-27: qty 1

## 2024-02-27 MED ORDER — SODIUM CHLORIDE 0.9 % IV SOLN: INTRAVENOUS | Status: DC

## 2024-02-27 MED ORDER — ACETAMINOPHEN 325 MG PO TABS
650.0000 mg | ORAL_TABLET | Freq: Once | ORAL | Status: DC
  Filled 2024-02-27: qty 2

## 2024-02-27 NOTE — Patient Instructions (Signed)

## 2024-02-28 ENCOUNTER — Ambulatory Visit: Payer: Self-pay

## 2024-02-28 NOTE — Telephone Encounter (Signed)
 FYI Only or Action Required?: Action required by provider: wants cough medication called in the one that is non addictive.  Also wants CT with contrast for stomach issues.  Patient was last seen in primary care on 01/28/2024 by Teressa Harrie HERO, FNP.  Called Nurse Triage reporting Cough.  Symptoms began several weeks ago.  Interventions attempted: Nothing.  Symptoms are: gradually worsening.  Triage Disposition: See HCP Within 4 Hours (Or PCP Triage)  Patient/caregiver understands and will follow disposition?: No, wishes to speak with PCP      Copied from CRM [id number]. Topic: Clinical - Red Word Triage >> Feb 28, 2024  8:11 AM Larissa RAMAN wrote: [facility] that prompted transfer to Nurse Triage: cough w/ mucous Reason for Disposition  [1] MILD difficulty breathing (e.g., minimal/no SOB at rest, SOB with walking, pulse < 100) AND [2] still present when not coughing  Answer Assessment - Initial Assessment Questions 1. ONSET: When did the cough begin?      States gets this every 6 months 2. SEVERITY: How bad is the cough today?      moderate 3. SPUTUM: Describe the color of your sputum (e.g., none, dry cough; clear, white, yellow, green)     clear 4. HEMOPTYSIS: Are you coughing up any blood? If Yes, ask: How much? (e.g., flecks, streaks, tablespoons, etc.)     no 5. DIFFICULTY BREATHING: Are you having difficulty breathing? If Yes, ask: How bad is it? (e.g., mild, moderate, severe)      moderate 6. FEVER: Do you have a fever? If Yes, ask: What is your temperature, how was it measured, and when did it start?     denies 7. CARDIAC HISTORY: Do you have any history of heart disease? (e.g., heart attack, congestive heart failure)      denies 8. LUNG HISTORY: Do you have any history of lung disease?  (e.g., pulmonary embolus, asthma, emphysema)     Copd and asthma 9. PE RISK FACTORS: Do you have a history of blood clots? (or: recent major surgery, recent prolonged  travel, bedridden)     no 10. OTHER SYMPTOMS: Do you have any other symptoms? (e.g., runny nose, wheezing, chest pain)       no 11. PREGNANCY: Is there any chance you are pregnant? When was your last menstrual period?       na 12. TRAVEL: Have you traveled out of the country in the last month? (e.g., travel history, exposures)       no  Protocols used: Cough - Acute Productive-A-AH

## 2024-02-28 NOTE — Telephone Encounter (Signed)
 Attempted to contact patient, mailbox is full-unable to leave message. Patient needs appointment to be evaluated for cough, patient also needs to discuss her stomach issues with provider before any testing is recommended or ordered.

## 2024-03-01 ENCOUNTER — Other Ambulatory Visit: Payer: Self-pay | Admitting: Family Medicine

## 2024-03-01 DIAGNOSIS — E782 Mixed hyperlipidemia: Secondary | ICD-10-CM

## 2024-03-01 NOTE — Progress Notes (Signed)
 Acute Office Visit  Subjective:    Patient ID: Connie Coffey, female    DOB: [birth year], 68 y.o.   MRN: [id number]  No chief complaint on file.   HPI: Patient is in today for ***  Discussed the use of AI scribe software for clinical note transcription with the patient, who gave verbal consent to proceed.  History of Present Illness     Past Medical History:  Diagnosis Date   Adrenal insufficiency    Anxiety    Arthritis    Carpal tunnel syndrome    Both hands, worse in right   Chronic constipation    COPD (chronic obstructive pulmonary disease) (HCC)    CVA (cerebral vascular accident) (HCC)    Depression    Facet syndrome    Fibromyalgia    GERD (gastroesophageal reflux disease)    Heart murmur    Hypercholesteremia    IBS (irritable bowel syndrome)    Ischemic stroke (HCC)    07/19/2004   Lumbar radiculopathy    Osteoporosis    Radiculopathy, lumbar region 05/02/2021   RLS (restless legs syndrome)    Spinal stenosis    Thyroid  disease    TMJ (temporomandibular joint disorder)    Trochanteric bursitis of both hips    Vascular malformation    spinal cord    Past Surgical History:  Procedure Laterality Date   APPENDECTOMY     CARPAL TUNNEL RELEASE Right 12/13/2017   surgery   CATARACT EXTRACTION Bilateral    CHOLECYSTECTOMY     COLONOSCOPY  05/06/2017   Colonic polyps status post polypectomy. Interan land external hemorrhoids.    ESOPHAGOGASTRODUODENOSCOPY  04/28/2007   Mild gastrtiis. Otherwise normal EGD.    HEMORROIDECTOMY     LAMINECTOMY AND MICRODISCECTOMY LUMBAR SPINE  01/03/2005   NECK SURGERY     x2    OTHER SURGICAL HISTORY  04/24/2004   anterior cervical discectomy and fusion at C5-C6   SPINAL CORD STIMULATOR INSERTION N/A 07/21/2021   Procedure: Permanant Spinal cord stimulator placement leads and battery under fluroscopy;  Surgeon: Darlis Deatrice RAMAN, MD;  Location: [facility] OR;  Service: Neurosurgery;  Laterality: N/A;   TONSILLECTOMY      TOTAL ABDOMINAL HYSTERECTOMY      Family History  Problem Relation Age of Onset   Supraventricular tachycardia Mother    Dementia Mother    Hyperlipidemia Mother    Hypertension Mother    Osteoarthritis Mother    Osteoporosis Mother    Throat cancer Father    Breast cancer Other    Colon cancer Neg Hx    Esophageal cancer Neg Hx     Social History   Socioeconomic History   Marital status: Divorced    Spouse name: Not on file   Number of children: 1   Years of education: Not on file   Highest education level: High school graduate  Occupational History   Occupation: Disabled  Tobacco Use   Smoking status: Every Day    Current packs/day: 0.50    Types: Cigarettes   Smokeless tobacco: Never  Vaping Use   Vaping status: Never Used  Substance and Sexual Activity   Alcohol use: Not Currently    Comment: quit 19 years   Drug use: Never   Sexual activity: Not Currently  Other Topics Concern   Not on file  Social History Narrative   Disabled son lives with her sometimes and his father sometimes.  Mylia is working hard to have a relationship with her  son's father and his wife.  They help her when needed.   Social Drivers of Corporate investment banker Strain: Low Risk  (05/14/2023)   Overall Financial Resource Strain (CARDIA)    Difficulty of Paying Living Expenses: Not hard at all  Food Insecurity: No Food Insecurity (02/24/2024)   Hunger Vital Sign    Worried About Running Out of Food in the Last Year: Never true    Ran Out of Food in the Last Year: Never true  Transportation Needs: No Transportation Needs (02/24/2024)   PRAPARE - Administrator, Civil Service (Medical): No    Lack of Transportation (Non-Medical): No  Physical Activity: Inactive (10/31/2023)   Exercise Vital Sign    Days of Exercise per Week: 0 days    Minutes of Exercise per Session: 0 min  Stress: Stress Concern Present (05/14/2023)   [employer] of Occupational Health -  Occupational Stress Questionnaire    Feeling of Stress : Rather much  Social Connections: Socially Isolated (05/14/2023)   Social Connection and Isolation Panel    Frequency of Communication with Friends and Family: More than three times a week    Frequency of Social Gatherings with Friends and Family: More than three times a week    Attends Religious Services: Never    Database administrator or Organizations: No    Attends Banker Meetings: Never    Marital Status: Divorced  Catering manager Violence: Not At Risk (02/24/2024)   Humiliation, Afraid, Rape, and Kick questionnaire    Fear of Current or Ex-Partner: No    Emotionally Abused: No    Physically Abused: No    Sexually Abused: No    Outpatient Medications Prior to Visit  Medication Sig Dispense Refill   albuterol  (VENTOLIN  HFA) 108 (90 Base) MCG/ACT inhaler Inhale 2 puffs into the lungs every 6 (six) hours as needed for wheezing or shortness of breath. 54 each 3   alendronate (FOSAMAX) 70 MG tablet Take 70 mg by mouth once a week.     ARIPiprazole  (ABILIFY ) 5 MG tablet Take 1 tablet (5 mg total) by mouth daily. 90 tablet 3   aspirin  EC 81 MG tablet Take 1 tablet (81 mg total) by mouth daily. Swallow whole. 90 tablet 3   atorvastatin  (LIPITOR) 80 MG tablet TAKE 1 TABLET BY MOUTH DAILY 100 tablet 2   Azelastine  HCl 137 MCG/SPRAY SOLN Place 2 sprays into the nose in the morning and at bedtime. 90 mL 3   bethanechol  (URECHOLINE ) 25 MG tablet TAKE 1 TABLET BY MOUTH IN THE  MORNING AND AT BEDTIME 200 tablet 2   busPIRone  (BUSPAR ) 30 MG tablet TAKE 1 TABLET BY MOUTH TWICE A DAY 180 tablet 1   cetirizine  (ZYRTEC ) 10 MG tablet TAKE 1 TABLET BY MOUTH EVERYDAY AT BEDTIME 90 tablet 2   cyclobenzaprine  (FLEXERIL ) 10 MG tablet Take 1 tablet (10 mg total) by mouth 3 (three) times daily. 270 tablet 1   dicyclomine  (BENTYL ) 20 MG tablet Take 1 tablet (20 mg total) by mouth 4 (four) times daily. 360 tablet 1   DULoxetine  (CYMBALTA ) 60  MG capsule Take 1 capsule (60 mg total) by mouth daily. 90 capsule 1   fenofibrate  160 MG tablet Take 1 tablet (160 mg total) by mouth daily. 90 tablet 1   fluticasone  (FLONASE ) 50 MCG/ACT nasal spray Place 1 spray into both nostrils daily. 48 mL 3   furosemide  (LASIX ) 20 MG tablet Take 1 tablet (20 mg  total) by mouth daily. 90 tablet 1   gabapentin  (NEURONTIN ) 600 MG tablet Take 1 tablet (600 mg total) by mouth 4 (four) times daily. 360 tablet 1   hydrocortisone  (CORTEF ) 10 MG tablet Take 1 tablet (10 mg total) by mouth 2 (two) times daily. 180 tablet 1   hydrOXYzine  (VISTARIL ) 25 MG capsule TAKE 1 CAPSULE (25 MG TOTAL) BY MOUTH EVERY 8 (EIGHT) HOURS AS NEEDED FOR ANXIETY OR ITCHING. 270 capsule 1   levothyroxine  (SYNTHROID ) 75 MCG tablet Take 1 tablet (75 mcg total) by mouth daily before breakfast. 90 tablet 1   montelukast  (SINGULAIR ) 10 MG tablet Take 1 tablet (10 mg total) by mouth daily. 100 tablet 2   Multiple Vitamin (MULTI-VITAMINS) TABS Take 1 tablet by mouth daily.     oxyCODONE -acetaminophen  (PERCOCET) 10-325 MG tablet Take 1 tablet by mouth every 8 (eight) hours as needed for pain. 30 tablet 0   pantoprazole  (PROTONIX ) 40 MG tablet Take 1 tablet (40 mg total) by mouth 2 (two) times daily. 180 tablet 1   primidone  (MYSOLINE ) 50 MG tablet Take 1 tablet (50 mg total) by mouth 2 (two) times daily. 180 tablet 1   Propylene Glycol (SYSTANE BALANCE) 0.6 % SOLN Place 1 drop into both eyes daily as needed (dry eyes). 15 mL 3   Tiotropium Bromide Monohydrate  (SPIRIVA  RESPIMAT) 2.5 MCG/ACT AERS USE 1 INHALATION BY MOUTH  TWICE DAILY 12 g 3   Vitamin D , Ergocalciferol , (DRISDOL ) 1.25 MG (50000 UNIT) CAPS capsule Take 1 capsule (50,000 Units total) by mouth every Wednesday. 15 capsule 3   No facility-administered medications prior to visit.    Allergies  Allergen Reactions   Beta Vulgaris Anaphylaxis    Beets    Penicillins Shortness Of Breath   Cat Dander    Celecoxib Other (See Comments)     because of stomach ulcers   Chantix [Varenicline]     Makes pt sick on her stomach    Dog Epithelium (Canis Lupus Familiaris)     Cats and dogs   Fish Oil Other (See Comments)    hemorrhoids   Gramineae Pollens    Molds & Smuts    Nsaids Other (See Comments)    GI Upset.  Ulcers   Other Other (See Comments)    beets  Including Celebrex   Red Beet Powder    Tape Dermatitis    Electrodes caused blistering   Nicotine Nausea Only and Other (See Comments)    Review of Systems     Objective:        02/27/2024   10:38 AM 02/27/2024    9:00 AM 02/25/2024    3:15 PM  Vitals with BMI  Height   5' 1.5  Weight   141 lbs 5 oz  BMI   26.27  Systolic 137 161 885  Diastolic 86 79 76  Pulse 92 108 104    No data found.   Physical Exam  Health Maintenance Due  Topic Date Due   Zoster Vaccines- Shingrix (1 of 2) 05/04/2004   COVID-19 Vaccine (5 - 2025-26 season) 01/27/2024    There are no preventive care reminders to display for this patient.   Lab Results  Component Value Date   TSH 0.663 08/30/2023   Lab Results  Component Value Date   WBC 14.5 (H) 02/25/2024   HGB 13.7 02/25/2024   HCT 44.8 02/25/2024   MCV 86.0 02/25/2024   PLT 541 (H) 02/25/2024   Lab Results  Component Value Date  NA 142 01/15/2024   K 4.5 01/15/2024   CO2 25 01/15/2024   GLUCOSE 96 01/15/2024   BUN 23 01/15/2024   CREATININE 0.86 01/15/2024   BILITOT <0.2 01/15/2024   ALKPHOS 44 01/15/2024   AST 31 01/15/2024   ALT 19 01/15/2024   PROT 6.9 01/15/2024   ALBUMIN 4.6 01/15/2024   CALCIUM  9.8 01/15/2024   ANIONGAP 8 07/21/2021   EGFR 73 01/15/2024   Lab Results  Component Value Date   CHOL 162 08/30/2023   Lab Results  Component Value Date   HDL 82 08/30/2023   Lab Results  Component Value Date   LDLCALC 58 08/30/2023   Lab Results  Component Value Date   TRIG 133 08/30/2023   Lab Results  Component Value Date   CHOLHDL 2.0 08/30/2023   Lab Results   Component Value Date   HGBA1C 5.8 (H) 08/30/2023        Results for orders placed or performed in visit on 02/25/24  Iron and TIBC   Collection Time: 02/25/24  2:38 PM  Result Value Ref Range   Iron 44 28 - 170 ug/dL   TIBC 363 (H) 749 - 549 ug/dL   Saturation Ratios 7 (L) 10.4 - 31.8 %   UIBC 592 ug/dL  Ferritin   Collection Time: 02/25/24  2:38 PM  Result Value Ref Range   Ferritin 17 11 - 307 ng/mL  CBC with Differential (Cancer Center Only)   Collection Time: 02/25/24  2:38 PM  Result Value Ref Range   WBC Count 14.5 (H) 4.0 - 10.5 K/uL   RBC 5.21 (H) 3.87 - 5.11 MIL/uL   Hemoglobin 13.7 12.0 - 15.0 g/dL   HCT 55.1 63.9 - 53.9 %   MCV 86.0 80.0 - 100.0 fL   MCH 26.3 26.0 - 34.0 pg   MCHC 30.6 30.0 - 36.0 g/dL   RDW 83.6 (H) 88.4 - 84.4 %   Platelet Count 541 (H) 150 - 400 K/uL   nRBC 0.0 0.0 - 0.2 %   Neutrophils Relative % 70 %   Neutro Abs 10.2 (H) 1.7 - 7.7 K/uL   Lymphocytes Relative 19 %   Lymphs Abs 2.8 0.7 - 4.0 K/uL   Monocytes Relative 7 %   Monocytes Absolute 1.0 0.1 - 1.0 K/uL   Eosinophils Relative 2 %   Eosinophils Absolute 0.3 0.0 - 0.5 K/uL   Basophils Relative 1 %   Basophils Absolute 0.1 0.0 - 0.1 K/uL   Immature Granulocytes 1 %   Abs Immature Granulocytes 0.10 (H) 0.00 - 0.07 K/uL     Assessment & Plan:   Assessment & Plan     There is no height or weight on file to calculate BMI.SABRA  Assessment and Plan Assessment & Plan      No orders of the defined types were placed in this encounter.   No orders of the defined types were placed in this encounter.    Follow-up: No follow-ups on file.  An After Visit Summary was printed and given to the patient.  Abigail Free, MD [facility] [phone]

## 2024-03-02 ENCOUNTER — Ambulatory Visit (INDEPENDENT_AMBULATORY_CARE_PROVIDER_SITE_OTHER): Admitting: Family Medicine

## 2024-03-02 ENCOUNTER — Encounter: Payer: Self-pay | Admitting: Family Medicine

## 2024-03-02 VITALS — BP 128/62 | HR 112 | Temp 97.0°F | Ht 61.5 in | Wt 141.6 lb

## 2024-03-02 DIAGNOSIS — K869 Disease of pancreas, unspecified: Secondary | ICD-10-CM | POA: Diagnosis not present

## 2024-03-02 DIAGNOSIS — H6122 Impacted cerumen, left ear: Secondary | ICD-10-CM

## 2024-03-02 DIAGNOSIS — M25511 Pain in right shoulder: Secondary | ICD-10-CM

## 2024-03-02 DIAGNOSIS — J410 Simple chronic bronchitis: Secondary | ICD-10-CM

## 2024-03-02 DIAGNOSIS — J301 Allergic rhinitis due to pollen: Secondary | ICD-10-CM

## 2024-03-02 DIAGNOSIS — K219 Gastro-esophageal reflux disease without esophagitis: Secondary | ICD-10-CM | POA: Diagnosis not present

## 2024-03-02 DIAGNOSIS — Z860100 Personal history of colon polyps, unspecified: Secondary | ICD-10-CM | POA: Diagnosis not present

## 2024-03-02 DIAGNOSIS — G8929 Other chronic pain: Secondary | ICD-10-CM | POA: Diagnosis not present

## 2024-03-02 DIAGNOSIS — R053 Chronic cough: Secondary | ICD-10-CM

## 2024-03-02 DIAGNOSIS — R198 Other specified symptoms and signs involving the digestive system and abdomen: Secondary | ICD-10-CM | POA: Diagnosis not present

## 2024-03-02 NOTE — Patient Instructions (Signed)
 Call [facility] to reschedule mri of shoulder  - [phone]  Continue current medications.

## 2024-03-03 ENCOUNTER — Other Ambulatory Visit: Payer: Self-pay | Admitting: Family Medicine

## 2024-03-03 MED ORDER — PROMETHAZINE-DM 6.25-15 MG/5ML PO SYRP: 5.0000 mL | ORAL_SOLUTION | Freq: Four times a day (QID) | ORAL | 0 refills | Status: DC | PRN

## 2024-03-05 ENCOUNTER — Ambulatory Visit

## 2024-03-05 VITALS — BP 149/70 | HR 103 | Temp 97.9°F | Resp 18

## 2024-03-05 DIAGNOSIS — J410 Simple chronic bronchitis: Secondary | ICD-10-CM | POA: Insufficient documentation

## 2024-03-05 DIAGNOSIS — H6122 Impacted cerumen, left ear: Secondary | ICD-10-CM | POA: Insufficient documentation

## 2024-03-05 DIAGNOSIS — R053 Chronic cough: Secondary | ICD-10-CM | POA: Insufficient documentation

## 2024-03-05 DIAGNOSIS — D509 Iron deficiency anemia, unspecified: Secondary | ICD-10-CM

## 2024-03-05 DIAGNOSIS — E611 Iron deficiency: Secondary | ICD-10-CM | POA: Diagnosis not present

## 2024-03-05 MED ORDER — ACETAMINOPHEN 325 MG PO TABS: 650.0000 mg | ORAL_TABLET | Freq: Once | ORAL | Status: DC

## 2024-03-05 MED ORDER — SODIUM CHLORIDE 0.9 % IV SOLN: INTRAVENOUS | Status: DC

## 2024-03-05 MED ORDER — LORATADINE 10 MG PO TABS: 10.0000 mg | ORAL_TABLET | Freq: Once | ORAL | Status: DC

## 2024-03-05 MED ORDER — SODIUM CHLORIDE 0.9 % IV SOLN
510.0000 mg | Freq: Once | INTRAVENOUS | Status: AC
  Administered 2024-03-05: 510 mg via INTRAVENOUS
  Filled 2024-03-05: qty 510

## 2024-03-05 NOTE — Assessment & Plan Note (Signed)
-   Continue azelastine , cetirizine , montelukast , and Flonase .

## 2024-03-05 NOTE — Assessment & Plan Note (Signed)
Continue spiriva and albuterol.  

## 2024-03-05 NOTE — Assessment & Plan Note (Signed)
 Left ear cerumen impaction. - Recommend using Debrox ear drops to clean wax. - If Debrox is ineffective, return for ear irrigation.

## 2024-03-05 NOTE — Patient Instructions (Signed)

## 2024-03-05 NOTE — Assessment & Plan Note (Signed)
 Arthrocentesis successfully administered.  Call [facility] to reschedule mri of shoulder  - [phone]   Orders:   Joint Injection/Arthrocentesis

## 2024-03-05 NOTE — Assessment & Plan Note (Signed)
 Chronic cough with allergic rhinitis and hoarseness Chronic dry cough with hoarseness likely due to allergies. Lungs clear, non-productive cough. - Recommend using phenergan  with dextromethorphan.  - Follow up with pharmacy to identify effective non-drug cough syrup.

## 2024-03-08 ENCOUNTER — Other Ambulatory Visit: Payer: Self-pay | Admitting: Family Medicine

## 2024-03-09 ENCOUNTER — Ambulatory Visit: Payer: Self-pay

## 2024-03-09 NOTE — Progress Notes (Unsigned)
 Acute Office Visit  Subjective:    Patient ID: Connie Coffey, female    DOB: April 20, 1956, 68 y.o.   MRN: [id number]  Chief Complaint  Patient presents with   Shoulder Pain    Right. Heating pad helps some. Pain medication only works for about 1 hour.    Discussed the use of AI scribe software for clinical note transcription with the patient, who gave verbal consent to proceed.  History of Present Illness Connie Coffey is a 68 year old female with arthritis who presents with persistent shoulder pain and hoarseness.  Shoulder pain and associated symptoms - Persistent severe pain localized to the anterior shoulder, radiating up the neck - Pain quality described as similar to a pinched nerve - No relief from corticosteroid injection administered last week - Partial relief with use of a heating pad and immobility - No history of shoulder surgery - History of arthritis with prior positive response to corticosteroid injections for back pain - Current pain management includes cyclobenzaprine  three times daily, gabapentin  600 mg four times daily, and Percocet from the [facility] - Unable to reschedule MRI due to difficulty obtaining contact information  Hoarseness and cough - Hoarseness and loss of voice occurred over the weekend, now improved - Persistent cough managed with non-narcotic cough syrup - No fever - History of COPD and asthma, managed with Spiriva  and albuterol  inhaler twice daily  Excessive sweating and constitutional symptoms - Excessive sweating described as water pouring off, especially around the neck - Sweating episodes began approximately one month ago, occurring during normal activities and causing malaise - No association with new medications - Episodes of dizziness - Concern that sweating may be related to elevated platelet count  Hematologic abnormalities and iron deficiency - History of thrombocytosis with elevated platelet count -  Receiving iron infusions for iron deficiency (iron level was 7) - Completed two iron infusions; follow-up scheduled in six months  Gastrointestinal intolerance to nsaids - History of ulcers and gastrointestinal upset with NSAIDs - Unable to tolerate ibuprofen, Aleve, or Celebrex due to gastrointestinal side effects    Past Medical History:  Diagnosis Date   Adrenal insufficiency    Anxiety    Arthritis    Carpal tunnel syndrome    Both hands, worse in right   Chronic constipation    COPD (chronic obstructive pulmonary disease) (HCC)    CVA (cerebral vascular accident) (HCC)    Depression    Facet syndrome    Fibromyalgia    GERD (gastroesophageal reflux disease)    Heart murmur    Hypercholesteremia    IBS (irritable bowel syndrome)    Ischemic stroke (HCC)    07/19/2004   Lumbar radiculopathy    Osteoporosis    Radiculopathy, lumbar region 05/02/2021   RLS (restless legs syndrome)    Spinal stenosis    Thyroid  disease    TMJ (temporomandibular joint disorder)    Trochanteric bursitis of both hips    Vascular malformation    spinal cord    Past Surgical History:  Procedure Laterality Date   APPENDECTOMY     CARPAL TUNNEL RELEASE Right 12/13/2017   surgery   CATARACT EXTRACTION Bilateral    CHOLECYSTECTOMY     COLONOSCOPY  05/06/2017   Colonic polyps status post polypectomy. Interan land external hemorrhoids.    ESOPHAGOGASTRODUODENOSCOPY  04/28/2007   Mild gastrtiis. Otherwise normal EGD.    HEMORROIDECTOMY     LAMINECTOMY AND MICRODISCECTOMY LUMBAR SPINE  01/03/2005  NECK SURGERY     x2    OTHER SURGICAL HISTORY  04/24/2004   anterior cervical discectomy and fusion at C5-C6   SPINAL CORD STIMULATOR INSERTION N/A 07/21/2021   Procedure: Permanant Spinal cord stimulator placement leads and battery under fluroscopy;  Surgeon: Darlis Deatrice RAMAN, MD;  Location: [facility] OR;  Service: Neurosurgery;  Laterality: N/A;   TONSILLECTOMY     TOTAL ABDOMINAL HYSTERECTOMY       Family History  Problem Relation Age of Onset   Supraventricular tachycardia Mother    Dementia Mother    Hyperlipidemia Mother    Hypertension Mother    Osteoarthritis Mother    Osteoporosis Mother    Throat cancer Father    Breast cancer Other    Colon cancer Neg Hx    Esophageal cancer Neg Hx     Social History   Socioeconomic History   Marital status: Divorced    Spouse name: Not on file   Number of children: 1   Years of education: Not on file   Highest education level: High school graduate  Occupational History   Occupation: Disabled  Tobacco Use   Smoking status: Every Day    Current packs/day: 0.50    Types: Cigarettes   Smokeless tobacco: Never  Vaping Use   Vaping status: Never Used  Substance and Sexual Activity   Alcohol use: Not Currently    Comment: quit 19 years   Drug use: Never   Sexual activity: Not Currently  Other Topics Concern   Not on file  Social History Narrative   Disabled son lives with her sometimes and his father sometimes.  Fabianna is working hard to have a relationship with her son's father and his wife.  They help her when needed.   Social Drivers of Corporate investment banker Strain: Low Risk  (05/14/2023)   Overall Financial Resource Strain (CARDIA)    Difficulty of Paying Living Expenses: Not hard at all  Food Insecurity: Low Risk  (03/02/2024)   Received from [facility]   Hunger Vital Sign    Within the past 12 months, you worried that your food would run out before you got money to buy more: Never true    Within the past 12 months, the food you bought just didn't last and you didn't have money to get more. : Never true  Transportation Needs: No Transportation Needs (03/02/2024)   Received from [facility]    In the past 12 months, has lack of reliable transportation kept you from medical appointments, meetings, work or from getting things needed for daily living? : No  Physical Activity: Inactive  (10/31/2023)   Exercise Vital Sign    Days of Exercise per Week: 0 days    Minutes of Exercise per Session: 0 min  Stress: Stress Concern Present (05/14/2023)   [employer] of Occupational Health - Occupational Stress Questionnaire    Feeling of Stress : Rather much  Social Connections: Socially Isolated (05/14/2023)   Social Connection and Isolation Panel    Frequency of Communication with Friends and Family: More than three times a week    Frequency of Social Gatherings with Friends and Family: More than three times a week    Attends Religious Services: Never    Database administrator or Organizations: No    Attends Banker Meetings: Never    Marital Status: Divorced  Catering manager Violence: Not At Risk (02/24/2024)   Humiliation, Afraid, Rape,  and Kick questionnaire    Fear of Current or Ex-Partner: No    Emotionally Abused: No    Physically Abused: No    Sexually Abused: No    Outpatient Medications Prior to Visit  Medication Sig Dispense Refill   albuterol  (VENTOLIN  HFA) 108 (90 Base) MCG/ACT inhaler Inhale 2 puffs into the lungs every 6 (six) hours as needed for wheezing or shortness of breath. 54 each 3   alendronate (FOSAMAX) 70 MG tablet Take 70 mg by mouth once a week.     ARIPiprazole  (ABILIFY ) 5 MG tablet Take 1 tablet (5 mg total) by mouth daily. 90 tablet 3   aspirin  EC 81 MG tablet Take 1 tablet (81 mg total) by mouth daily. Swallow whole. 90 tablet 3   atorvastatin  (LIPITOR) 80 MG tablet TAKE 1 TABLET BY MOUTH DAILY 100 tablet 2   Azelastine  HCl 137 MCG/SPRAY SOLN Place 2 sprays into the nose in the morning and at bedtime. 90 mL 3   bethanechol  (URECHOLINE ) 25 MG tablet TAKE 1 TABLET BY MOUTH IN THE  MORNING AND AT BEDTIME 200 tablet 2   busPIRone  (BUSPAR ) 30 MG tablet TAKE 1 TABLET BY MOUTH TWICE A DAY 180 tablet 1   cetirizine  (ZYRTEC ) 10 MG tablet TAKE 1 TABLET BY MOUTH EVERYDAY AT BEDTIME 90 tablet 2   cyclobenzaprine  (FLEXERIL ) 10 MG tablet  Take 1 tablet (10 mg total) by mouth 3 (three) times daily. 270 tablet 1   dicyclomine  (BENTYL ) 20 MG tablet Take 1 tablet (20 mg total) by mouth 4 (four) times daily. 360 tablet 1   DULoxetine  (CYMBALTA ) 60 MG capsule Take 1 capsule (60 mg total) by mouth daily. 90 capsule 1   fenofibrate  160 MG tablet TAKE 1 TABLET BY MOUTH DAILY 100 tablet 2   fluticasone  (FLONASE ) 50 MCG/ACT nasal spray Place 1 spray into both nostrils daily. 48 mL 3   furosemide  (LASIX ) 20 MG tablet Take 1 tablet (20 mg total) by mouth daily. 90 tablet 1   gabapentin  (NEURONTIN ) 600 MG tablet TAKE 1 TABLET BY MOUTH 4 TIMES  DAILY 400 tablet 2   hydrocortisone  (CORTEF ) 10 MG tablet Take 1 tablet (10 mg total) by mouth 2 (two) times daily. 180 tablet 1   hydrOXYzine  (VISTARIL ) 25 MG capsule TAKE 1 CAPSULE (25 MG TOTAL) BY MOUTH EVERY 8 (EIGHT) HOURS AS NEEDED FOR ANXIETY OR ITCHING. 270 capsule 1   levothyroxine  (SYNTHROID ) 75 MCG tablet Take 1 tablet (75 mcg total) by mouth daily before breakfast. 90 tablet 1   montelukast  (SINGULAIR ) 10 MG tablet Take 1 tablet (10 mg total) by mouth daily. 100 tablet 2   Multiple Vitamin (MULTI-VITAMINS) TABS Take 1 tablet by mouth daily.     oxyCODONE -acetaminophen  (PERCOCET) 10-325 MG tablet Take 1 tablet by mouth every 8 (eight) hours as needed for pain. 30 tablet 0   pantoprazole  (PROTONIX ) 40 MG tablet Take 1 tablet (40 mg total) by mouth 2 (two) times daily. 180 tablet 1   primidone  (MYSOLINE ) 50 MG tablet Take 1 tablet (50 mg total) by mouth 2 (two) times daily. 180 tablet 1   Propylene Glycol (SYSTANE BALANCE) 0.6 % SOLN Place 1 drop into both eyes daily as needed (dry eyes). 15 mL 3   Tiotropium Bromide Monohydrate  (SPIRIVA  RESPIMAT) 2.5 MCG/ACT AERS USE 1 INHALATION BY MOUTH  TWICE DAILY 12 g 3   Vitamin D , Ergocalciferol , (DRISDOL ) 1.25 MG (50000 UNIT) CAPS capsule Take 1 capsule (50,000 Units total) by mouth every Wednesday. 15  capsule 3   promethazine -dextromethorphan  (PROMETHAZINE -DM) 6.25-15 MG/5ML syrup Take 5 mLs by mouth 4 (four) times daily as needed for cough. 180 mL 0   No facility-administered medications prior to visit.    Allergies  Allergen Reactions   Beta Vulgaris Anaphylaxis    Beets    Penicillins Shortness Of Breath   Cat Dander    Celecoxib Other (See Comments)    because of stomach ulcers   Chantix [Varenicline]     Makes pt sick on her stomach    Dog Epithelium (Canis Lupus Familiaris)     Cats and dogs   Fish Oil Other (See Comments)    hemorrhoids   Gramineae Pollens    Molds & Smuts    Nsaids Other (See Comments)    GI Upset.  Ulcers   Other Other (See Comments)    beets  Including Celebrex   Red Beet Powder    Tape Dermatitis    Electrodes caused blistering   Nicotine Nausea Only and Other (See Comments)    Review of Systems     Objective:        03/10/2024   10:42 AM 03/05/2024    9:49 AM 03/05/2024    8:54 AM  Vitals with BMI  Height 5' 1.5    Weight 142 lbs    BMI 26.4    Systolic 112 149 896  Diastolic 64 70 74  Pulse 58 103 102    No data found.   Physical Exam Vitals reviewed.  Constitutional:      Appearance: Normal appearance.  HENT:     Right Ear: Tympanic membrane normal. There is impacted cerumen.     Left Ear: Tympanic membrane, ear canal and external ear normal.     Nose: Nose normal.     Mouth/Throat:     Pharynx: Posterior oropharyngeal erythema (mild) present.     Comments: hoarse Cardiovascular:     Rate and Rhythm: Normal rate and regular rhythm.     Heart sounds: Normal heart sounds. No murmur heard. Pulmonary:     Effort: Pulmonary effort is normal. No respiratory distress.     Breath sounds: Wheezing (diffuse) present.  Musculoskeletal:        General: Tenderness present.     Comments: RIGHT SHOULDER EXAM TENDER: anterior and posterior FROM ABNORMAL ABDUCTION: LIMITED EXTERNAL ROTATION: LIMITED INTERNAL ROTATION: LIMITED EMPTY CAN SIGN: POSITIVE    Lymphadenopathy:     Cervical: No cervical adenopathy.  Neurological:     Mental Status: She is alert and oriented to person, place, and time.  Psychiatric:        Mood and Affect: Mood normal.        Behavior: Behavior normal.     Health Maintenance Due  Topic Date Due   Zoster Vaccines- Shingrix (1 of 2) 05/04/2004   COVID-19 Vaccine (5 - 2025-26 season) 01/27/2024    There are no preventive care reminders to display for this patient.   Lab Results  Component Value Date   TSH 0.121 (L) 03/10/2024   Lab Results  Component Value Date   WBC 12.1 (H) 03/10/2024   HGB 13.5 03/10/2024   HCT 43.6 03/10/2024   MCV 91 03/10/2024   PLT 484 (H) 03/10/2024   Lab Results  Component Value Date   NA 144 03/10/2024   K 3.6 03/10/2024   CO2 25 03/10/2024   GLUCOSE 93 03/10/2024   BUN 15 03/10/2024   CREATININE 0.55 (L) 03/10/2024   BILITOT <  0.2 03/10/2024   ALKPHOS 42 (L) 03/10/2024   AST 18 03/10/2024   ALT 28 03/10/2024   PROT 6.4 03/10/2024   ALBUMIN 4.5 03/10/2024   CALCIUM  9.8 03/10/2024   ANIONGAP 8 07/21/2021   EGFR 99 03/10/2024   Lab Results  Component Value Date   CHOL 162 08/30/2023   Lab Results  Component Value Date   HDL 82 08/30/2023   Lab Results  Component Value Date   LDLCALC 58 08/30/2023   Lab Results  Component Value Date   TRIG 133 08/30/2023   Lab Results  Component Value Date   CHOLHDL 2.0 08/30/2023   Lab Results  Component Value Date   HGBA1C 5.8 (H) 08/30/2023        Results for orders placed or performed in visit on 03/10/24  CBC with Differential/Platelet   Collection Time: 03/10/24 11:34 AM  Result Value Ref Range   WBC 12.1 (H) 3.4 - 10.8 x10E3/uL   RBC 4.80 3.77 - 5.28 x10E6/uL   Hemoglobin 13.5 11.1 - 15.9 g/dL   Hematocrit 56.3 65.9 - 46.6 %   MCV 91 79 - 97 fL   MCH 28.1 26.6 - 33.0 pg   MCHC 31.0 (L) 31.5 - 35.7 g/dL   RDW 80.9 (H) 88.2 - 84.5 %   Platelets 484 (H) 150 - 450 x10E3/uL   Neutrophils 73 Not  Estab. %   Lymphs 17 Not Estab. %   Monocytes 6 Not Estab. %   Eos 2 Not Estab. %   Basos 1 Not Estab. %   Neutrophils Absolute 8.9 (H) 1.4 - 7.0 x10E3/uL   Lymphocytes Absolute 2.1 0.7 - 3.1 x10E3/uL   Monocytes Absolute 0.7 0.1 - 0.9 x10E3/uL   EOS (ABSOLUTE) 0.2 0.0 - 0.4 x10E3/uL   Basophils Absolute 0.1 0.0 - 0.2 x10E3/uL   Immature Granulocytes 1 Not Estab. %   Immature Grans (Abs) 0.1 0.0 - 0.1 x10E3/uL  Comprehensive metabolic panel with GFR   Collection Time: 03/10/24 11:34 AM  Result Value Ref Range   Glucose 93 70 - 99 mg/dL   BUN 15 8 - 27 mg/dL   Creatinine, Ser 9.44 (L) 0.57 - 1.00 mg/dL   eGFR 99 >40 fO/fpw/8.26   BUN/Creatinine Ratio 27 12 - 28   Sodium 144 134 - 144 mmol/L   Potassium 3.6 3.5 - 5.2 mmol/L   Chloride 102 96 - 106 mmol/L   CO2 25 20 - 29 mmol/L   Calcium  9.8 8.7 - 10.3 mg/dL   Total Protein 6.4 6.0 - 8.5 g/dL   Albumin 4.5 3.9 - 4.9 g/dL   Globulin, Total 1.9 1.5 - 4.5 g/dL   Bilirubin Total <9.7 0.0 - 1.2 mg/dL   Alkaline Phosphatase 42 (L) 49 - 135 IU/L   AST 18 0 - 40 IU/L   ALT 28 0 - 32 IU/L  TSH   Collection Time: 03/10/24 11:34 AM  Result Value Ref Range   TSH 0.121 (L) 0.450 - 4.500 uIU/mL  QuantiFERON-TB Gold Plus   Collection Time: 03/10/24 11:34 AM  Result Value Ref Range   QuantiFERON Incubation Incubation performed.    QuantiFERON Criteria Comment    QuantiFERON TB1 Ag Value 0.08 IU/mL   QuantiFERON TB2 Ag Value 0.19 IU/mL   QuantiFERON Nil Value 0.06 IU/mL   QuantiFERON Mitogen Value >10.00 IU/mL   QuantiFERON-TB Gold Plus Negative Negative     Assessment & Plan:   Assessment & Plan Simple chronic bronchitis (HCC) COPD and asthma with recent  exacerbation characterized by wheezing and hoarseness. Current management includes Spiriva  and albuterol  inhalers. Reports increased use of rescue inhaler and adherence to inhaler regimen. No evidence of pneumonia, likely COPD exacerbation. - Continue Spiriva  and albuterol   inhalers. - Encourage smoking cessation.    Chronic cough Chronic cough and hoarseness, likely related to COPD and asthma exacerbation. Previous treatment with non-narcotic cough syrup was effective. - Refill non-narcotic cough syrup. Orders:   QuantiFERON-TB Gold Plus  Chronic right shoulder pain Persistent right shoulder pain with suspected rotator cuff pathology. Severe pain extending to the neck suggests possible nerve involvement. Limited range of motion and pain on specific maneuvers indicate rotator cuff involvement. MRI rescheduling is pending for further evaluation. - Reschedule MRI of the right shoulder at [facility]. - Order Lidoderm  patches pending prior authorization.    Diaphoresis  Orders:   CBC with Differential/Platelet   Comprehensive metabolic panel with GFR   TSH   QuantiFERON-TB Gold Plus  Thrombocytosis Thrombocytosis with associated symptoms of sweating. Reports recent iron infusions and follow-up with hematology. - Discuss sweating symptoms with hematology during next visit. Orders:   CBC with Differential/Platelet  NSAID induced gastritis NSAID-induced gastrointestinal intolerance with previous GI upset and ulcers. Avoids NSAIDs and reports intolerance to Celebrex.    Chronic pain syndrome Chronic pain syndrome managed with cyclobenzaprine , gabapentin , and Percocet. Reports limited relief from current regimen and dissatisfaction with physical therapy. Previous positive response to higher dose Lidoderm  patches noted. - Continue current pain management regimen with cyclobenzaprine , gabapentin , and Percocet. - Order Lidoderm  patches pending prior authorization.    Chronic midline low back pain without sciatica Osteoarthritis of the back with previous relief from corticosteroid injections. Current management includes pain medications and muscle relaxants.    Other iron deficiency anemia Iron deficiency with recent iron infusions. Follow-up with  hematology scheduled for next year.       Body mass index is 26.4 kg/m..  Meds ordered this encounter  Medications   lidocaine  (LIDODERM ) 5 %    Sig: Place 2 patches onto the skin daily. Remove & Discard patch within 12 hours or as directed by MD    Dispense:  60 patch    Refill:  2   promethazine -dextromethorphan (PROMETHAZINE -DM) 6.25-15 MG/5ML syrup    Sig: Take 5 mLs by mouth 4 (four) times daily as needed for cough.    Dispense:  180 mL    Refill:  1    Orders Placed This Encounter  Procedures   CBC with Differential/Platelet   Comprehensive metabolic panel with GFR   TSH   QuantiFERON-TB Gold Plus     Follow-up: No follow-ups on file.   I,Marla I Leal-Borjas,acting as a scribe for Abigail Free, MD.,have documented all relevant documentation on the behalf of Abigail Free, MD,as directed by  Abigail Free, MD while in the presence of Abigail Free, MD.   An After Visit Summary was printed and given to the patient.  Abigail Free, MD [facility] [phone]
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
## 2024-03-10 ENCOUNTER — Ambulatory Visit (INDEPENDENT_AMBULATORY_CARE_PROVIDER_SITE_OTHER): Admitting: Family Medicine

## 2024-03-10 ENCOUNTER — Encounter: Payer: Self-pay | Admitting: Family Medicine

## 2024-03-10 VITALS — BP 112/64 | HR 58 | Temp 98.1°F | Ht 61.5 in | Wt 142.0 lb

## 2024-03-10 DIAGNOSIS — G894 Chronic pain syndrome: Secondary | ICD-10-CM

## 2024-03-10 DIAGNOSIS — K2960 Other gastritis without bleeding: Secondary | ICD-10-CM

## 2024-03-10 DIAGNOSIS — J410 Simple chronic bronchitis: Secondary | ICD-10-CM | POA: Diagnosis not present

## 2024-03-10 DIAGNOSIS — M545 Low back pain, unspecified: Secondary | ICD-10-CM | POA: Diagnosis not present

## 2024-03-10 DIAGNOSIS — T39395A Adverse effect of other nonsteroidal anti-inflammatory drugs [NSAID], initial encounter: Secondary | ICD-10-CM

## 2024-03-10 DIAGNOSIS — G8929 Other chronic pain: Secondary | ICD-10-CM

## 2024-03-10 DIAGNOSIS — D508 Other iron deficiency anemias: Secondary | ICD-10-CM | POA: Diagnosis not present

## 2024-03-10 DIAGNOSIS — D75839 Thrombocytosis, unspecified: Secondary | ICD-10-CM

## 2024-03-10 DIAGNOSIS — M25511 Pain in right shoulder: Secondary | ICD-10-CM | POA: Diagnosis not present

## 2024-03-10 DIAGNOSIS — R053 Chronic cough: Secondary | ICD-10-CM | POA: Diagnosis not present

## 2024-03-10 DIAGNOSIS — R61 Generalized hyperhidrosis: Secondary | ICD-10-CM | POA: Diagnosis not present

## 2024-03-10 MED ORDER — PROMETHAZINE-DM 6.25-15 MG/5ML PO SYRP: 5.0000 mL | ORAL_SOLUTION | Freq: Four times a day (QID) | ORAL | 1 refills | Status: DC | PRN

## 2024-03-10 MED ORDER — LIDOCAINE 5 % EX PTCH: 2.0000 | MEDICATED_PATCH | CUTANEOUS | 2 refills | Status: AC

## 2024-03-10 NOTE — Assessment & Plan Note (Addendum)
 Persistent right shoulder pain with suspected rotator cuff pathology. Severe pain extending to the neck suggests possible nerve involvement. Limited range of motion and pain on specific maneuvers indicate rotator cuff involvement. MRI rescheduling is pending for further evaluation. - attempted to reschedule MRI of the right shoulder at [facility], but unable to do because of recent iron infusion. - Order Lidoderm  patches pending prior authorization.  - Intolerant to nonsteroid antiinflammatories, such as naproxen (aleve) or ibuprofen (advil.).  - Narcotics, muscle relaxants not helping

## 2024-03-10 NOTE — Patient Instructions (Addendum)
 Call [facility] scheduling to reschedule mri of shoulder  - [phone]   Continue current medications.    VISIT SUMMARY: During your visit, we discussed your persistent shoulder pain, hoarseness, excessive sweating, and other ongoing health issues. We reviewed your current medications and made plans for further evaluations and treatments.  YOUR PLAN: RIGHT SHOULDER PAIN: Persistent severe pain in your right shoulder, likely due to rotator cuff issues, with pain extending to your neck. -Reschedule MRI of the right shoulder at [facility]. -Order Lidoderm  patches pending prior authorization.  CHRONIC PAIN SYNDROME: Ongoing pain managed with medications, with limited relief from current treatment. -Continue current pain management regimen with cyclobenzaprine , gabapentin , and Percocet. -Order Lidoderm  patches pending prior authorization.  OSTEOARTHRITIS OF THE BACK: Osteoarthritis in your back, previously managed with corticosteroid injections and pain medications. -Continue current pain management regimen.  CHRONIC OBSTRUCTIVE PULMONARY DISEASE (COPD) AND ASTHMA: COPD and asthma with recent exacerbation causing wheezing and hoarseness. -Continue using Spiriva  and albuterol  inhalers. -Encourage smoking cessation.  CHRONIC COUGH AND HOARSENESS: Chronic cough and hoarseness likely related to COPD and asthma exacerbation. -Refill non-narcotic cough syrup.  THROMBOCYTOSIS: Elevated platelet count with associated symptoms of sweating. -Discuss sweating symptoms with hematology during your next visit.  IRON DEFICIENCY: Iron deficiency managed with recent iron infusions. -Follow-up with hematology scheduled for next year.                            Contains text generated by [employer].

## 2024-03-10 NOTE — Assessment & Plan Note (Addendum)
 COPD and asthma with recent exacerbation characterized by wheezing and hoarseness. Current management includes Spiriva  and albuterol  inhalers. Reports increased use of rescue inhaler and adherence to inhaler regimen. No evidence of pneumonia, likely COPD. - Continue Spiriva  and albuterol  inhalers. - Encouraged smoking cessation.

## 2024-03-10 NOTE — Assessment & Plan Note (Addendum)
 Thrombocytosis with associated symptoms of sweating. Reports recent iron infusions and follow-up with hematology. - Discuss sweating symptoms with hematology during next visit. Orders:   CBC with Differential/Platelet

## 2024-03-10 NOTE — Assessment & Plan Note (Addendum)
 Chronic cough and hoarseness, likely related to COPD and asthma exacerbation. Previous treatment with non-narcotic cough syrup was effective. - Refill promethazine  dextromethorphan syrup. Orders:   QuantiFERON-TB Gold Plus

## 2024-03-11 NOTE — Telephone Encounter (Unsigned)
 Copied from CRM #[id number]. Topic: Clinical - Medical Advice >> Mar 11, 2024  8:40 AM Fonda T wrote: Reason for CRM: Patient calling to report that MRI of shoulder could Not be scheduled for another 3 months due to a recent iron infusion patient had, per imaging.  Patient would like to know how to proceed with further treatment.  Patient can be reached at [phone] to discuss further.  Patient is aware of same day call back.

## 2024-03-11 NOTE — Telephone Encounter (Signed)
 Attempted to contact patient, unable to leave voicemail.

## 2024-03-12 ENCOUNTER — Other Ambulatory Visit: Payer: Self-pay | Admitting: Family Medicine

## 2024-03-12 DIAGNOSIS — G8929 Other chronic pain: Secondary | ICD-10-CM

## 2024-03-13 DIAGNOSIS — R61 Generalized hyperhidrosis: Secondary | ICD-10-CM | POA: Insufficient documentation

## 2024-03-13 DIAGNOSIS — K2960 Other gastritis without bleeding: Secondary | ICD-10-CM | POA: Insufficient documentation

## 2024-03-13 LAB — CBC WITH DIFFERENTIAL/PLATELET
Basophils Absolute: 0.1 x10E3/uL (ref 0.0–0.2)
Basos: 1 %
EOS (ABSOLUTE): 0.2 x10E3/uL (ref 0.0–0.4)
Eos: 2 %
Hematocrit: 43.6 % (ref 34.0–46.6)
Hemoglobin: 13.5 g/dL (ref 11.1–15.9)
Immature Grans (Abs): 0.1 x10E3/uL (ref 0.0–0.1)
Immature Granulocytes: 1 %
Lymphocytes Absolute: 2.1 x10E3/uL (ref 0.7–3.1)
Lymphs: 17 %
MCH: 28.1 pg (ref 26.6–33.0)
MCHC: 31 g/dL — ABNORMAL LOW (ref 31.5–35.7)
MCV: 91 fL (ref 79–97)
Monocytes Absolute: 0.7 x10E3/uL (ref 0.1–0.9)
Monocytes: 6 %
Neutrophils Absolute: 8.9 x10E3/uL — ABNORMAL HIGH (ref 1.4–7.0)
Neutrophils: 73 %
Platelets: 484 x10E3/uL — ABNORMAL HIGH (ref 150–450)
RBC: 4.8 x10E6/uL (ref 3.77–5.28)
RDW: 19 % — ABNORMAL HIGH (ref 11.7–15.4)
WBC: 12.1 x10E3/uL — ABNORMAL HIGH (ref 3.4–10.8)

## 2024-03-13 LAB — COMPREHENSIVE METABOLIC PANEL WITH GFR
ALT: 28 IU/L (ref 0–32)
AST: 18 IU/L (ref 0–40)
Albumin: 4.5 g/dL (ref 3.9–4.9)
Alkaline Phosphatase: 42 IU/L — ABNORMAL LOW (ref 49–135)
BUN/Creatinine Ratio: 27 (ref 12–28)
BUN: 15 mg/dL (ref 8–27)
Bilirubin Total: <0.2 mg/dL (ref 0.0–1.2)
CO2: 25 mmol/L (ref 20–29)
Calcium: 9.8 mg/dL (ref 8.7–10.3)
Chloride: 102 mmol/L (ref 96–106)
Creatinine, Ser: 0.55 mg/dL — ABNORMAL LOW (ref 0.57–1.00)
Globulin, Total: 1.9 g/dL (ref 1.5–4.5)
Glucose: 93 mg/dL (ref 70–99)
Potassium: 3.6 mmol/L (ref 3.5–5.2)
Sodium: 144 mmol/L (ref 134–144)
Total Protein: 6.4 g/dL (ref 6.0–8.5)
eGFR: 99 mL/min/1.73 (ref >59)

## 2024-03-13 LAB — QUANTIFERON-TB GOLD PLUS
QuantiFERON Mitogen Value: >10 [IU]/mL
QuantiFERON Nil Value: 0.06 [IU]/mL
QuantiFERON TB1 Ag Value: 0.08 [IU]/mL
QuantiFERON TB2 Ag Value: 0.19 [IU]/mL
QuantiFERON-TB Gold Plus: NEGATIVE

## 2024-03-13 LAB — TSH: TSH: 0.121 u[IU]/mL — ABNORMAL LOW (ref 0.450–4.500)

## 2024-03-13 NOTE — Assessment & Plan Note (Addendum)
  Orders:   CBC with Differential/Platelet   Comprehensive metabolic panel with GFR   TSH   QuantiFERON-TB Gold Plus

## 2024-03-13 NOTE — Assessment & Plan Note (Signed)
 Chronic pain syndrome managed with cyclobenzaprine , gabapentin , and Percocet. Reports limited relief from current regimen and dissatisfaction with physical therapy. Previous positive response to higher dose Lidoderm  patches noted. - Continue current pain management regimen with cyclobenzaprine , gabapentin , and Percocet. - Order Lidoderm  patches pending prior authorization.

## 2024-03-13 NOTE — Assessment & Plan Note (Signed)
 Iron deficiency with recent iron infusions. Follow-up with hematology scheduled for next year.

## 2024-03-13 NOTE — Assessment & Plan Note (Signed)
 Osteoarthritis of the back with previous relief from corticosteroid injections. Current management includes pain medications and muscle relaxants.

## 2024-03-13 NOTE — Assessment & Plan Note (Signed)
 NSAID-induced gastrointestinal intolerance with previous GI upset and ulcers. Avoids NSAIDs and reports intolerance to Celebrex.

## 2024-03-15 ENCOUNTER — Ambulatory Visit: Payer: Self-pay | Admitting: Family Medicine

## 2024-03-15 DIAGNOSIS — E039 Hypothyroidism, unspecified: Secondary | ICD-10-CM

## 2024-03-16 MED ORDER — LEVOTHYROXINE SODIUM 50 MCG PO TABS: 50.0000 ug | ORAL_TABLET | Freq: Every day | ORAL | 3 refills | Status: AC

## 2024-03-18 ENCOUNTER — Ambulatory Visit: Payer: Self-pay

## 2024-03-18 NOTE — Telephone Encounter (Signed)
 FYI Only or Action Required?: FYI only for provider.  Patient was last seen in primary care on 03/10/2024 by Sherre Clapper, MD.  Called Nurse Triage reporting No chief complaint on file..  Symptoms began several weeks ago.  Interventions attempted: Prescription medications: Promethazine -DM.  Symptoms are: gradually worsening.  Triage Disposition: See Physician Within 24 Hours  Patient/caregiver understands and will follow disposition?: Yes     Copied from CRM #[id number]. Topic: Clinical - Red Word Triage >> Mar 18, 2024 11:28 AM Joesph NOVAK wrote: Red Word that prompted transfer to Nurse Triage: Sore throat and coughing, hoarse. She is getting worse. Also, shoulder pain.       Reason for Disposition  SEVERE coughing spells (e.g., whooping sound after coughing, vomiting after coughing)  Answer Assessment - Initial Assessment Questions 1. ONSET: When did the cough begin?      Chronic cough 2. SEVERITY: How bad is the cough today?      Moderate  3. SPUTUM: Describe the color of your sputum (e.g., none, dry cough; clear, white, yellow, green)     Intermittent clear sputum  4. HEMOPTYSIS: Are you coughing up any blood? If so ask: How much? (e.g., flecks, streaks, tablespoons, etc.)     No 5. DIFFICULTY BREATHING: Are you having difficulty breathing? If Yes, ask: How bad is it? (e.g., mild, moderate, severe)      No 6. FEVER: Do you have a fever? If Yes, ask: What is your temperature, how was it measured, and when did it start?     No 7. CARDIAC HISTORY: Do you have any history of heart disease? (e.g., heart attack, congestive heart failure)      No 8. LUNG HISTORY: Do you have any history of lung disease?  (e.g., pulmonary embolus, asthma, emphysema)     Yes 9. PE RISK FACTORS: Do you have a history of blood clots? (or: recent major surgery, recent prolonged travel, bedridden)     No 10. OTHER SYMPTOMS: Do you have any other symptoms? (e.g., runny  nose, wheezing, chest pain)       Sore throat, hoarse voice, shoulder pain  Protocols used: Cough - Chronic-A-AH

## 2024-03-19 ENCOUNTER — Encounter: Payer: Self-pay | Admitting: Family Medicine

## 2024-03-19 ENCOUNTER — Ambulatory Visit (INDEPENDENT_AMBULATORY_CARE_PROVIDER_SITE_OTHER): Admitting: Family Medicine

## 2024-03-19 VITALS — BP 126/68 | HR 99 | Temp 98.1°F | Ht 61.5 in | Wt 145.0 lb

## 2024-03-19 DIAGNOSIS — F17200 Nicotine dependence, unspecified, uncomplicated: Secondary | ICD-10-CM

## 2024-03-19 DIAGNOSIS — H6120 Impacted cerumen, unspecified ear: Secondary | ICD-10-CM

## 2024-03-19 DIAGNOSIS — J069 Acute upper respiratory infection, unspecified: Secondary | ICD-10-CM

## 2024-03-19 DIAGNOSIS — R490 Dysphonia: Secondary | ICD-10-CM | POA: Diagnosis not present

## 2024-03-19 DIAGNOSIS — G8929 Other chronic pain: Secondary | ICD-10-CM | POA: Diagnosis not present

## 2024-03-19 DIAGNOSIS — M25511 Pain in right shoulder: Secondary | ICD-10-CM | POA: Diagnosis not present

## 2024-03-19 MED ORDER — AZITHROMYCIN 250 MG PO TABS: ORAL_TABLET | ORAL | 0 refills | Status: AC

## 2024-03-19 NOTE — Patient Instructions (Signed)
  VISIT SUMMARY: During your visit, we addressed your worsening hoarseness, loss of voice, persistent cough, and sore throat. We also discussed your ongoing shoulder pain, tobacco use, and ear wax buildup.  YOUR PLAN: ACUTE UPPER RESPIRATORY INFECTION WITH HOARSENESS AND SORE THROAT: You have a worsening hoarseness and loss of voice with a sore throat, but no fever, chills, or chest pain. -Take the prescribed Z-Pak (azithromycin ) for 5 days. It will continue to work for 10 days. -Increase your fluid intake and gargle warm salt water. -Try to minimize talking to rest your voice.  TOBACCO USE: You continue to smoke despite having respiratory symptoms. -We discussed the difficulty of smoking with your current symptoms. Consider reducing or quitting smoking to help improve your symptoms.  SUSPECTED ROTATOR CUFF TEAR OF SHOULDER: You have persistent shoulder pain and a suspected rotator cuff tear. You recently had a corticosteroid injection, so another injection is not possible at this time.  -Contact Dr. Henri office to confirm your referral and schedule an appointment.   CERUMEN IMPACTION, EAR: You have ear wax buildup, but no signs of infection. -Continue using ear drops to help with the wax buildup.                      Contains text generated by Abridge.                                 Contains text generated by Abridge.

## 2024-03-19 NOTE — Progress Notes (Signed)
 Acute Office Visit  Subjective:    Patient ID: Connie Coffey, female    DOB: 11-28-1955, 68 y.o.   MRN: [id number]  Chief Complaint  Patient presents with   Cough    Hoarse since last week   Shoulder Pain    HPI: Patient is in today for ***  Discussed the use of AI scribe software for clinical note transcription with the patient, who gave verbal consent to proceed.  History of Present Illness Connie Coffey is a 68 year old female who presents with worsening hoarseness, loss of voice, and cough.  Hoarseness and voice loss - Worsening hoarseness and loss of voice - Initial improvement followed by deterioration - Difficulty with communication due to voice loss, impacting ability to answer calls, including those related to Medicare insurance  Cough and sore throat - Persistent cough - Sore throat - No fevers, chills, sweats, earaches, or chest pain - Cough medicine initially provided relief but is no longer effective - Gargling warm salt water for throat symptoms  Tobacco use - Continues to smoke despite respiratory symptoms  Cerumen impaction - Using ear drops for wax buildup, started recently  Shoulder pain - Significant shoulder pain - Received shoulder injection approximately three weeks ago - Awaiting MRI appointment scheduled for January    Past Medical History:  Diagnosis Date   Adrenal insufficiency    Anxiety    Arthritis    Carpal tunnel syndrome    Both hands, worse in right   Chronic constipation    COPD (chronic obstructive pulmonary disease) (HCC)    CVA (cerebral vascular accident) (HCC)    Depression    Facet syndrome    Fibromyalgia    GERD (gastroesophageal reflux disease)    Heart murmur    Hypercholesteremia    IBS (irritable bowel syndrome)    Ischemic stroke (HCC)    07/19/2004   Lumbar radiculopathy    Osteoporosis    Radiculopathy, lumbar region 05/02/2021   RLS (restless legs syndrome)    Spinal stenosis    Thyroid  disease     TMJ (temporomandibular joint disorder)    Trochanteric bursitis of both hips    Vascular malformation    spinal cord    Past Surgical History:  Procedure Laterality Date   APPENDECTOMY     CARPAL TUNNEL RELEASE Right 12/13/2017   surgery   CATARACT EXTRACTION Bilateral    CHOLECYSTECTOMY     COLONOSCOPY  05/06/2017   Colonic polyps status post polypectomy. Interan land external hemorrhoids.    ESOPHAGOGASTRODUODENOSCOPY  04/28/2007   Mild gastrtiis. Otherwise normal EGD.    HEMORROIDECTOMY     LAMINECTOMY AND MICRODISCECTOMY LUMBAR SPINE  01/03/2005   NECK SURGERY     x2    OTHER SURGICAL HISTORY  04/24/2004   anterior cervical discectomy and fusion at C5-C6   SPINAL CORD STIMULATOR INSERTION N/A 07/21/2021   Procedure: Permanant Spinal cord stimulator placement leads and battery under fluroscopy;  Surgeon: Darlis Deatrice RAMAN, MD;  Location: [facility] OR;  Service: Neurosurgery;  Laterality: N/A;   TONSILLECTOMY     TOTAL ABDOMINAL HYSTERECTOMY      Family History  Problem Relation Age of Onset   Supraventricular tachycardia Mother    Dementia Mother    Hyperlipidemia Mother    Hypertension Mother    Osteoarthritis Mother    Osteoporosis Mother    Throat cancer Father    Breast cancer Other    Colon cancer Neg Hx    Esophageal  cancer Neg Hx     Social History   Socioeconomic History   Marital status: Divorced    Spouse name: Not on file   Number of children: 1   Years of education: Not on file   Highest education level: High school graduate  Occupational History   Occupation: Disabled  Tobacco Use   Smoking status: Every Day    Current packs/day: 0.50    Types: Cigarettes   Smokeless tobacco: Never  Vaping Use   Vaping status: Never Used  Substance and Sexual Activity   Alcohol use: Not Currently    Comment: quit 19 years   Drug use: Never   Sexual activity: Not Currently  Other Topics Concern   Not on file  Social History Narrative   Disabled son lives with  her sometimes and his father sometimes.  Connie Coffey is working hard to have a relationship with her son's father and his wife.  They help her when needed.   Social Drivers of Corporate Investment Banker Strain: Low Risk  (05/14/2023)   Overall Financial Resource Strain (CARDIA)    Difficulty of Paying Living Expenses: Not hard at all  Food Insecurity: Low Risk  (03/02/2024)   Received from [facility]   Hunger Vital Sign    Within the past 12 months, you worried that your food would run out before you got money to buy more: Never true    Within the past 12 months, the food you bought just didn't last and you didn't have money to get more. : Never true  Transportation Needs: No Transportation Needs (03/02/2024)   Received from [facility]    In the past 12 months, has lack of reliable transportation kept you from medical appointments, meetings, work or from getting things needed for daily living? : No  Physical Activity: Inactive (10/31/2023)   Exercise Vital Sign    Days of Exercise per Week: 0 days    Minutes of Exercise per Session: 0 min  Stress: Stress Concern Present (05/14/2023)   [employer] of Occupational Health - Occupational Stress Questionnaire    Feeling of Stress : Rather much  Social Connections: Socially Isolated (05/14/2023)   Social Connection and Isolation Panel    Frequency of Communication with Friends and Family: More than three times a week    Frequency of Social Gatherings with Friends and Family: More than three times a week    Attends Religious Services: Never    Database Administrator or Organizations: No    Attends Banker Meetings: Never    Marital Status: Divorced  Catering Manager Violence: Not At Risk (02/24/2024)   Humiliation, Afraid, Rape, and Kick questionnaire    Fear of Current or Ex-Partner: No    Emotionally Abused: No    Physically Abused: No    Sexually Abused: No    Outpatient Medications Prior to Visit   Medication Sig Dispense Refill   albuterol  (VENTOLIN  HFA) 108 (90 Base) MCG/ACT inhaler Inhale 2 puffs into the lungs every 6 (six) hours as needed for wheezing or shortness of breath. 54 each 3   alendronate (FOSAMAX) 70 MG tablet Take 70 mg by mouth once a week.     ARIPiprazole  (ABILIFY ) 5 MG tablet Take 1 tablet (5 mg total) by mouth daily. 90 tablet 3   aspirin  EC 81 MG tablet Take 1 tablet (81 mg total) by mouth daily. Swallow whole. 90 tablet 3   atorvastatin  (LIPITOR) 80 MG  tablet TAKE 1 TABLET BY MOUTH DAILY 100 tablet 2   Azelastine  HCl 137 MCG/SPRAY SOLN Place 2 sprays into the nose in the morning and at bedtime. 90 mL 3   bethanechol  (URECHOLINE ) 25 MG tablet TAKE 1 TABLET BY MOUTH IN THE  MORNING AND AT BEDTIME 200 tablet 2   busPIRone  (BUSPAR ) 30 MG tablet TAKE 1 TABLET BY MOUTH TWICE A DAY 180 tablet 1   cetirizine  (ZYRTEC ) 10 MG tablet TAKE 1 TABLET BY MOUTH EVERYDAY AT BEDTIME 90 tablet 2   cyclobenzaprine  (FLEXERIL ) 10 MG tablet Take 1 tablet (10 mg total) by mouth 3 (three) times daily. 270 tablet 1   dicyclomine  (BENTYL ) 20 MG tablet Take 1 tablet (20 mg total) by mouth 4 (four) times daily. 360 tablet 1   DULoxetine  (CYMBALTA ) 60 MG capsule Take 1 capsule (60 mg total) by mouth daily. 90 capsule 1   fenofibrate  160 MG tablet TAKE 1 TABLET BY MOUTH DAILY 100 tablet 2   fluticasone  (FLONASE ) 50 MCG/ACT nasal spray Place 1 spray into both nostrils daily. 48 mL 3   furosemide  (LASIX ) 20 MG tablet Take 1 tablet (20 mg total) by mouth daily. 90 tablet 1   gabapentin  (NEURONTIN ) 600 MG tablet TAKE 1 TABLET BY MOUTH 4 TIMES  DAILY 400 tablet 2   hydrocortisone  (CORTEF ) 10 MG tablet Take 1 tablet (10 mg total) by mouth 2 (two) times daily. 180 tablet 1   hydrOXYzine  (VISTARIL ) 25 MG capsule TAKE 1 CAPSULE (25 MG TOTAL) BY MOUTH EVERY 8 (EIGHT) HOURS AS NEEDED FOR ANXIETY OR ITCHING. 270 capsule 1   levothyroxine  (SYNTHROID ) 50 MCG tablet Take 1 tablet (50 mcg total) by mouth daily.  90 tablet 3   lidocaine  (LIDODERM ) 5 % Place 2 patches onto the skin daily. Remove & Discard patch within 12 hours or as directed by MD 60 patch 2   montelukast  (SINGULAIR ) 10 MG tablet Take 1 tablet (10 mg total) by mouth daily. 100 tablet 2   Multiple Vitamin (MULTI-VITAMINS) TABS Take 1 tablet by mouth daily.     oxyCODONE -acetaminophen  (PERCOCET) 10-325 MG tablet Take 1 tablet by mouth every 8 (eight) hours as needed for pain. 30 tablet 0   pantoprazole  (PROTONIX ) 40 MG tablet Take 1 tablet (40 mg total) by mouth 2 (two) times daily. 180 tablet 1   primidone  (MYSOLINE ) 50 MG tablet Take 1 tablet (50 mg total) by mouth 2 (two) times daily. 180 tablet 1   promethazine -dextromethorphan (PROMETHAZINE -DM) 6.25-15 MG/5ML syrup Take 5 mLs by mouth 4 (four) times daily as needed for cough. 180 mL 1   Propylene Glycol (SYSTANE BALANCE) 0.6 % SOLN Place 1 drop into both eyes daily as needed (dry eyes). 15 mL 3   Tiotropium Bromide Monohydrate  (SPIRIVA  RESPIMAT) 2.5 MCG/ACT AERS USE 1 INHALATION BY MOUTH  TWICE DAILY 12 g 3   Vitamin D , Ergocalciferol , (DRISDOL ) 1.25 MG (50000 UNIT) CAPS capsule Take 1 capsule (50,000 Units total) by mouth every Wednesday. 15 capsule 3   No facility-administered medications prior to visit.    Allergies  Allergen Reactions   Beta Vulgaris Anaphylaxis    Beets    Penicillins Shortness Of Breath   Cat Dander    Celecoxib Other (See Comments)    because of stomach ulcers   Chantix [Varenicline]     Makes pt sick on her stomach    Dog Epithelium (Canis Lupus Familiaris)     Cats and dogs   Fish Oil Other (See Comments)  hemorrhoids   Gramineae Pollens    Molds & Smuts    Nsaids Other (See Comments)    GI Upset.  Ulcers   Other Other (See Comments)    beets  Including Celebrex   Red Beet Powder    Tape Dermatitis    Electrodes caused blistering   Nicotine Nausea Only and Other (See Comments)    Review of Systems     Objective:         03/19/2024   10:30 AM 03/10/2024   10:42 AM 03/05/2024    9:49 AM  Vitals with BMI  Height 5' 1.5 5' 1.5   Weight 145 lbs 142 lbs   BMI 26.96 26.4   Systolic 126 112 850  Diastolic 68 64 70  Pulse 99 58 103    No data found.   Physical Exam Vitals reviewed.  Constitutional:      Appearance: Normal appearance.  HENT:     Right Ear: Tympanic membrane, ear canal and external ear normal.     Left Ear: Tympanic membrane, ear canal and external ear normal.     Nose: Nose normal.     Mouth/Throat:     Mouth: Mucous membranes are dry.     Pharynx: Oropharynx is clear. No posterior oropharyngeal erythema.  Cardiovascular:     Rate and Rhythm: Normal rate and regular rhythm.     Heart sounds: Normal heart sounds. No murmur heard. Pulmonary:     Effort: Pulmonary effort is normal. No respiratory distress.     Breath sounds: Normal breath sounds.  Lymphadenopathy:     Cervical: No cervical adenopathy.  Neurological:     Mental Status: She is alert and oriented to person, place, and time.  Psychiatric:        Mood and Affect: Mood normal.        Behavior: Behavior normal.     Health Maintenance Due  Topic Date Due   Zoster Vaccines- Shingrix (1 of 2) 05/04/2004   COVID-19 Vaccine (5 - 2025-26 season) 01/27/2024   Medicare Annual Wellness (AWV)  05/13/2024    There are no preventive care reminders to display for this patient.   Lab Results  Component Value Date   TSH 0.121 (L) 03/10/2024   Lab Results  Component Value Date   WBC 12.1 (H) 03/10/2024   HGB 13.5 03/10/2024   HCT 43.6 03/10/2024   MCV 91 03/10/2024   PLT 484 (H) 03/10/2024   Lab Results  Component Value Date   NA 144 03/10/2024   K 3.6 03/10/2024   CO2 25 03/10/2024   GLUCOSE 93 03/10/2024   BUN 15 03/10/2024   CREATININE 0.55 (L) 03/10/2024   BILITOT <0.2 03/10/2024   ALKPHOS 42 (L) 03/10/2024   AST 18 03/10/2024   ALT 28 03/10/2024   PROT 6.4 03/10/2024   ALBUMIN 4.5 03/10/2024   CALCIUM   9.8 03/10/2024   ANIONGAP 8 07/21/2021   EGFR 99 03/10/2024   Lab Results  Component Value Date   CHOL 162 08/30/2023   Lab Results  Component Value Date   HDL 82 08/30/2023   Lab Results  Component Value Date   LDLCALC 58 08/30/2023   Lab Results  Component Value Date   TRIG 133 08/30/2023   Lab Results  Component Value Date   CHOLHDL 2.0 08/30/2023   Lab Results  Component Value Date   HGBA1C 5.8 (H) 08/30/2023        Results for orders placed or performed in  visit on 03/10/24  CBC with Differential/Platelet   Collection Time: 03/10/24 11:34 AM  Result Value Ref Range   WBC 12.1 (H) 3.4 - 10.8 x10E3/uL   RBC 4.80 3.77 - 5.28 x10E6/uL   Hemoglobin 13.5 11.1 - 15.9 g/dL   Hematocrit 56.3 65.9 - 46.6 %   MCV 91 79 - 97 fL   MCH 28.1 26.6 - 33.0 pg   MCHC 31.0 (L) 31.5 - 35.7 g/dL   RDW 80.9 (H) 88.2 - 84.5 %   Platelets 484 (H) 150 - 450 x10E3/uL   Neutrophils 73 Not Estab. %   Lymphs 17 Not Estab. %   Monocytes 6 Not Estab. %   Eos 2 Not Estab. %   Basos 1 Not Estab. %   Neutrophils Absolute 8.9 (H) 1.4 - 7.0 x10E3/uL   Lymphocytes Absolute 2.1 0.7 - 3.1 x10E3/uL   Monocytes Absolute 0.7 0.1 - 0.9 x10E3/uL   EOS (ABSOLUTE) 0.2 0.0 - 0.4 x10E3/uL   Basophils Absolute 0.1 0.0 - 0.2 x10E3/uL   Immature Granulocytes 1 Not Estab. %   Immature Grans (Abs) 0.1 0.0 - 0.1 x10E3/uL  Comprehensive metabolic panel with GFR   Collection Time: 03/10/24 11:34 AM  Result Value Ref Range   Glucose 93 70 - 99 mg/dL   BUN 15 8 - 27 mg/dL   Creatinine, Ser 9.44 (L) 0.57 - 1.00 mg/dL   eGFR 99 >40 fO/fpw/8.26   BUN/Creatinine Ratio 27 12 - 28   Sodium 144 134 - 144 mmol/L   Potassium 3.6 3.5 - 5.2 mmol/L   Chloride 102 96 - 106 mmol/L   CO2 25 20 - 29 mmol/L   Calcium  9.8 8.7 - 10.3 mg/dL   Total Protein 6.4 6.0 - 8.5 g/dL   Albumin 4.5 3.9 - 4.9 g/dL   Globulin, Total 1.9 1.5 - 4.5 g/dL   Bilirubin Total <9.7 0.0 - 1.2 mg/dL   Alkaline Phosphatase 42 (L) 49 - 135  IU/L   AST 18 0 - 40 IU/L   ALT 28 0 - 32 IU/L  TSH   Collection Time: 03/10/24 11:34 AM  Result Value Ref Range   TSH 0.121 (L) 0.450 - 4.500 uIU/mL  QuantiFERON-TB Gold Plus   Collection Time: 03/10/24 11:34 AM  Result Value Ref Range   QuantiFERON Incubation Incubation performed.    QuantiFERON Criteria Comment    QuantiFERON TB1 Ag Value 0.08 IU/mL   QuantiFERON TB2 Ag Value 0.19 IU/mL   QuantiFERON Nil Value 0.06 IU/mL   QuantiFERON Mitogen Value >10.00 IU/mL   QuantiFERON-TB Gold Plus Negative Negative     Assessment & Plan:   Assessment & Plan Acute upper respiratory infection Worsening hoarseness and loss of voice with sore throat. No fever, chills, or chest pain. Smoking continues despite symptoms. No wheezing, so steroids are not indicated. - Prescribe Z-Pak (azithromycin ) for 5 days, noting it works for 10 days. - Advise to increase fluid intake and gargle warm salt water. - Advise to minimize talking.    Hoarse Worsening hoarseness and loss of voice with sore throat. No fever, chills, or chest pain. Smoking continues despite symptoms. No wheezing, so steroids are not indicated. - Prescribe Z-Pak (azithromycin ) for 5 days, noting it works for 10 days. - Advise to increase fluid intake and gargle warm salt water. - Advise to minimize talking.    Tobacco use disorder Continues to smoke despite respiratory symptoms. - Discuss the difficulty of smoking with current symptoms.    Chronic right shoulder  pain Persistent shoulder pain with suspected rotator cuff tear. Previous corticosteroid injection given recently, so another injection is not possible at this time. MRI appointment not available until January. Referral to Dr. Arcelia for evaluation, hoping for in-house ultrasound instead of MRI. - Instruct to contact Dr. Lenita office to confirm referral and schedule an appointment. - Advise to inquire about the possibility of an in-house ultrasound at Dr. Lenita  office.    Impacted cerumen, unspecified laterality Presence of ear wax, but no signs of infection. Difficulty visualizing due to wax.     Body mass index is 26.95 kg/m..   Meds ordered this encounter  Medications   azithromycin  (ZITHROMAX ) 250 MG tablet    Sig: Take 2 tablets on day 1, then 1 tablet daily on days 2 through 5    Dispense:  6 tablet    Refill:  0    No orders of the defined types were placed in this encounter.    I,Marla I Leal-Borjas,acting as a scribe for Abigail Free, MD.,have documented all relevant documentation on the behalf of Abigail Free, MD,as directed by  Abigail Free, MD while in the presence of Abigail Free, MD.   Follow-up: No follow-ups on file.  An After Visit Summary was printed and given to the patient.  Abigail Free, MD [facility] [phone]

## 2024-03-22 DIAGNOSIS — J069 Acute upper respiratory infection, unspecified: Secondary | ICD-10-CM | POA: Insufficient documentation

## 2024-03-22 DIAGNOSIS — H6120 Impacted cerumen, unspecified ear: Secondary | ICD-10-CM | POA: Insufficient documentation

## 2024-03-22 NOTE — Assessment & Plan Note (Signed)
 Persistent shoulder pain with suspected rotator cuff tear. Previous corticosteroid injection given recently, so another injection is not possible at this time. MRI appointment not available until January. Referral to Dr. Arcelia for evaluation, hoping for in-house ultrasound instead of MRI. - Instruct to contact Dr. Lenita office to confirm referral and schedule an appointment. - Advise to inquire about the possibility of an in-house ultrasound at Dr. Lenita office.

## 2024-03-22 NOTE — Assessment & Plan Note (Signed)
 Presence of ear wax, but no signs of infection. Difficulty visualizing due to wax.

## 2024-03-22 NOTE — Assessment & Plan Note (Signed)
 Continues to smoke despite respiratory symptoms. - Discuss the difficulty of smoking with current symptoms.

## 2024-03-22 NOTE — Assessment & Plan Note (Signed)
 Worsening hoarseness and loss of voice with sore throat. No fever, chills, or chest pain. Smoking continues despite symptoms. No wheezing, so steroids are not indicated. - Prescribe Z-Pak (azithromycin ) for 5 days, noting it works for 10 days. - Advise to increase fluid intake and gargle warm salt water. - Advise to minimize talking.

## 2024-03-23 DIAGNOSIS — Z981 Arthrodesis status: Secondary | ICD-10-CM | POA: Diagnosis not present

## 2024-03-23 DIAGNOSIS — G894 Chronic pain syndrome: Secondary | ICD-10-CM | POA: Diagnosis not present

## 2024-03-23 DIAGNOSIS — M961 Postlaminectomy syndrome, not elsewhere classified: Secondary | ICD-10-CM | POA: Diagnosis not present

## 2024-03-23 DIAGNOSIS — M5414 Radiculopathy, thoracic region: Secondary | ICD-10-CM | POA: Diagnosis not present

## 2024-03-23 DIAGNOSIS — M5416 Radiculopathy, lumbar region: Secondary | ICD-10-CM | POA: Diagnosis not present

## 2024-03-25 ENCOUNTER — Other Ambulatory Visit: Payer: Self-pay | Admitting: Family Medicine

## 2024-03-25 DIAGNOSIS — M75111 Incomplete rotator cuff tear or rupture of right shoulder, not specified as traumatic: Secondary | ICD-10-CM | POA: Diagnosis not present

## 2024-03-25 DIAGNOSIS — R600 Localized edema: Secondary | ICD-10-CM

## 2024-04-07 ENCOUNTER — Other Ambulatory Visit: Payer: Self-pay | Admitting: Family Medicine

## 2024-04-13 ENCOUNTER — Telehealth: Payer: Self-pay | Admitting: Family Medicine

## 2024-04-13 NOTE — Telephone Encounter (Unsigned)
 Copied from CRM #[id number]. Topic: Clinical - Medication Refill >> Apr 13, 2024  2:19 PM Amy B wrote: Medication:  primidone  (MYSOLINE ) 50 MG tablet  montelukast  (SINGULAIR ) 10 MG tablet  levothyroxine  (SYNTHROID ) 50 MCG tablet  gabapentin  (NEURONTIN ) 600 MG tablet  pantoprazole  (PROTONIX ) 40 MG tablet  ARIPiprazole  (ABILIFY ) 5 MG tablet  bethanechol  (URECHOLINE ) 25 MG tablet    Has the patient contacted their pharmacy? Yes (Agent: If no, request that the patient contact the pharmacy for the refill. If patient does not wish to contact the pharmacy document the reason why and proceed with request.) (Agent: If yes, when and what did the pharmacy advise?)  This is the patient's preferred pharmacy:   [facility] [place] [address] [place] [address] Phone: [phone] Fax: [phone]   Is this the correct pharmacy for this prescription? Yes If no, delete pharmacy and type the correct one.   Has the prescription been filled recently? No  Is the patient out of the medication? Yes  Has the patient been seen for an appointment in the last year OR does the patient have an upcoming appointment? Yes  Can we respond through MyChart? Yes  Agent: Please be advised that Rx refills may take up to 3 business days. We ask that you follow-up with your pharmacy.

## 2024-04-14 NOTE — Telephone Encounter (Signed)
 Noted all medications requested have refills available. Attempted to call patient, unable to leave voicemail as is full.

## 2024-04-20 ENCOUNTER — Encounter: Payer: Self-pay | Admitting: Family Medicine

## 2024-04-20 ENCOUNTER — Ambulatory Visit: Admitting: Family Medicine

## 2024-04-20 VITALS — BP 102/78 | HR 106 | Temp 97.8°F | Ht 61.5 in | Wt 142.0 lb

## 2024-04-20 DIAGNOSIS — F5101 Primary insomnia: Secondary | ICD-10-CM | POA: Diagnosis not present

## 2024-04-20 DIAGNOSIS — Z23 Encounter for immunization: Secondary | ICD-10-CM

## 2024-04-20 DIAGNOSIS — I700 Atherosclerosis of aorta: Secondary | ICD-10-CM

## 2024-04-20 DIAGNOSIS — R251 Tremor, unspecified: Secondary | ICD-10-CM

## 2024-04-20 DIAGNOSIS — R7303 Prediabetes: Secondary | ICD-10-CM

## 2024-04-20 DIAGNOSIS — E039 Hypothyroidism, unspecified: Secondary | ICD-10-CM

## 2024-04-20 DIAGNOSIS — E782 Mixed hyperlipidemia: Secondary | ICD-10-CM | POA: Diagnosis not present

## 2024-04-20 DIAGNOSIS — K219 Gastro-esophageal reflux disease without esophagitis: Secondary | ICD-10-CM

## 2024-04-20 DIAGNOSIS — G8929 Other chronic pain: Secondary | ICD-10-CM

## 2024-04-20 DIAGNOSIS — K5903 Drug induced constipation: Secondary | ICD-10-CM

## 2024-04-20 DIAGNOSIS — R600 Localized edema: Secondary | ICD-10-CM | POA: Diagnosis not present

## 2024-04-20 DIAGNOSIS — J301 Allergic rhinitis due to pollen: Secondary | ICD-10-CM

## 2024-04-20 DIAGNOSIS — F17210 Nicotine dependence, cigarettes, uncomplicated: Secondary | ICD-10-CM

## 2024-04-20 DIAGNOSIS — M62838 Other muscle spasm: Secondary | ICD-10-CM

## 2024-04-20 DIAGNOSIS — F330 Major depressive disorder, recurrent, mild: Secondary | ICD-10-CM | POA: Diagnosis not present

## 2024-04-20 DIAGNOSIS — M545 Low back pain, unspecified: Secondary | ICD-10-CM

## 2024-04-20 DIAGNOSIS — Z0000 Encounter for general adult medical examination without abnormal findings: Secondary | ICD-10-CM

## 2024-04-20 DIAGNOSIS — J410 Simple chronic bronchitis: Secondary | ICD-10-CM

## 2024-04-20 DIAGNOSIS — F331 Major depressive disorder, recurrent, moderate: Secondary | ICD-10-CM

## 2024-04-20 DIAGNOSIS — E271 Primary adrenocortical insufficiency: Secondary | ICD-10-CM

## 2024-04-20 NOTE — Assessment & Plan Note (Addendum)
 Managed with Spiriva  and Singulair . Reports hoarseness and cough, possibly related to smoking and illness. - Continue Spiriva  and Singulair  for respiratory management. - Encouraged smoking cessation to improve respiratory health.

## 2024-04-20 NOTE — Assessment & Plan Note (Deleted)
 Managed with aspirin  and atorvastatin . - Continue aspirin  and atorvastatin  for cardiovascular hea

## 2024-04-20 NOTE — Progress Notes (Signed)
 Subjective:  Patient ID: Connie Coffey, female    DOB: 03/11/1956  Age: 68 y.o. MRN: [id number]  Chief Complaint  Patient presents with   Medical Management of Chronic Issues    HPI: Discussed the use of AI scribe software for clinical note transcription with the patient, who gave verbal consent to proceed.  History of Present Illness Connie Coffey is a 68 year old female who presents for a routine blood workup.  Neuropsychiatric symptoms - Nervousness and shakiness for the past week despite medication (primidone  50 mg twice daily) for tremors - Symptoms attributed to anxiety and poor sleep related to concerns about her son - History of depression and anxiety - Anxious and sad mood due to multiple responsibilities and inability to complete tasks - Lightheadedness without true dizziness or headaches  Sleep disturbance and fatigue - Poor sleep and fatigue - Describes feeling 'dragging' due to lack of rest  Upper respiratory symptoms - Stuffy nose - Hoarseness - Persistent cough  Nicotine dependence - Has tried mints and patches in the past to help quit smoking  Constitutional symptoms - No fever, chills, nausea, vomiting, or abdominal pain  Polyuria and polydipsia - Increased thirst, hunger, and urination - No burning during urination  Musculoskeletal pain and edema - Chronic back pain - Swelling in ankles - Uses Lidoderm  patches for shoulder pain  Hyperlipidemia - ON atorvastatin  80 mg before bed and fenofibrate  160 mg daily.   Adrenal insufficiency - on cortef  10 mg twice daily.   Hypothyroidism - On levothyroxine  50 mcg once daily in AM.  GERD Pantoprazole  40 mg twice daily  Psychiatric illness Sees Dr. Maurice (specialist) - On Abilify  5 mg daily, buspirone  30 mg twice daily, duloxetine  60 mg daily, hydroxyzine  25 mg 3 times daily as needed anxiety  COPD - on spiriva  and ventolin   Allergic rhinitis - on singulair , azelastine , Flonase , and zyrtec .    Chronic back pain - Currently on cyclobenzaprine  10 mg 3 times daily, gabapentin  600 mg 4 times daily, Lidoderm  patch 5% 2 patches every 24 hours, and oxycodone  acetaminophen  10/325 mg 1 p.o. 3 times daily as needed. - Patient sees pain clinic -On Amitiza  8 mcg daily for opioid-induced constipation.  Vitamin D  deficiency - On vitamin D  50,000 weekly     04/20/2024   10:06 AM 03/05/2024    9:53 AM 02/25/2024    3:00 PM 02/24/2024    1:05 PM 01/15/2024    2:20 PM  Depression screen PHQ 2/9  Decreased Interest 0 0 0 0 1  Down, Depressed, Hopeless 0 0 0 0 1  PHQ - 2 Score 0 0 0 0 2  Altered sleeping 1    1  Tired, decreased energy 2    2  Change in appetite 1    1  Feeling bad or failure about yourself  1    1  Trouble concentrating 1    1  Moving slowly or fidgety/restless 0    1  Suicidal thoughts 0    0  PHQ-9 Score 6    9   Difficult doing work/chores Not difficult at all    Somewhat difficult     Data saved with a previous flowsheet row definition        04/20/2024    9:58 AM  Fall Risk   Falls in the past year? 1  Number falls in past yr: 0  Injury with Fall? 0  Risk for fall due to : No Fall Risks  Follow up Falls evaluation completed    Patient Care Team: Sherre Clapper, MD as PCP - General (Family Medicine) Charlanne Groom, MD as PCP - Gastroenterology (Gastroenterology) Joshua Alm Hamilton, MD as Consulting Physician (Neurosurgery) Burt Fus, DPM as Consulting Physician (Podiatry) Marda General, MD as Consulting Physician (Urology) Ezzard Valaria LABOR, MD as Consulting Physician (Oncology) Nyle Rankin POUR, [facility] (Inactive) (Pharmacist) Vannie Elsie HERO, OD (Ophthalmology)   Review of Systems  Constitutional:  Positive for fatigue. Negative for chills and fever.  HENT:  Positive for voice change. Negative for congestion, ear pain and sore throat.   Respiratory:  Positive for cough. Negative for shortness of breath.   Cardiovascular:  Negative for chest  pain.  Gastrointestinal:  Negative for abdominal pain, constipation, diarrhea, nausea and vomiting.  Endocrine: Positive for polydipsia, polyphagia and polyuria.  Genitourinary:  Negative for dysuria and urgency.  Musculoskeletal:  Positive for back pain. Negative for arthralgias and myalgias.  Skin:  Negative for rash.  Neurological:  Positive for light-headedness. Negative for dizziness and headaches.  Psychiatric/Behavioral:  Positive for dysphoric mood and sleep disturbance. The patient is nervous/anxious.     Current Outpatient Medications on File Prior to Visit  Medication Sig Dispense Refill   albuterol  (VENTOLIN  HFA) 108 (90 Base) MCG/ACT inhaler Inhale 2 puffs into the lungs every 6 (six) hours as needed for wheezing or shortness of breath. 54 each 3   alendronate (FOSAMAX) 70 MG tablet Take 70 mg by mouth once a week.     ARIPiprazole  (ABILIFY ) 5 MG tablet Take 1 tablet (5 mg total) by mouth daily. 90 tablet 3   aspirin  EC 81 MG tablet Take 1 tablet (81 mg total) by mouth daily. Swallow whole. 90 tablet 3   atorvastatin  (LIPITOR) 80 MG tablet TAKE 1 TABLET BY MOUTH DAILY 100 tablet 2   Azelastine  HCl 137 MCG/SPRAY SOLN Place 2 sprays into the nose in the morning and at bedtime. 90 mL 3   bethanechol  (URECHOLINE ) 25 MG tablet TAKE 1 TABLET BY MOUTH IN THE  MORNING AND AT BEDTIME 200 tablet 2   busPIRone  (BUSPAR ) 30 MG tablet TAKE 1 TABLET BY MOUTH TWICE A DAY 180 tablet 1   cetirizine  (ZYRTEC ) 10 MG tablet TAKE 1 TABLET BY MOUTH EVERYDAY AT BEDTIME 90 tablet 2   cyclobenzaprine  (FLEXERIL ) 10 MG tablet Take 1 tablet (10 mg total) by mouth 3 (three) times daily. 270 tablet 1   dicyclomine  (BENTYL ) 20 MG tablet Take 1 tablet (20 mg total) by mouth 4 (four) times daily. 360 tablet 1   DULoxetine  (CYMBALTA ) 60 MG capsule Take 1 capsule (60 mg total) by mouth daily. 90 capsule 1   fenofibrate  160 MG tablet TAKE 1 TABLET BY MOUTH DAILY 100 tablet 2   fluticasone  (FLONASE ) 50 MCG/ACT nasal  spray Place 1 spray into both nostrils daily. 48 mL 3   furosemide  (LASIX ) 20 MG tablet TAKE 1 TABLET BY MOUTH DAILY 100 tablet 2   gabapentin  (NEURONTIN ) 600 MG tablet TAKE 1 TABLET BY MOUTH 4 TIMES  DAILY 400 tablet 2   hydrocortisone  (CORTEF ) 10 MG tablet Take 1 tablet (10 mg total) by mouth 2 (two) times daily. 180 tablet 1   hydrOXYzine  (VISTARIL ) 25 MG capsule TAKE 1 CAPSULE (25 MG TOTAL) BY MOUTH EVERY 8 (EIGHT) HOURS AS NEEDED FOR ANXIETY OR ITCHING. 270 capsule 1   levothyroxine  (SYNTHROID ) 50 MCG tablet Take 1 tablet (50 mcg total) by mouth daily. 90 tablet 3   lidocaine  (LIDODERM ) 5 %  Place 2 patches onto the skin daily. Remove & Discard patch within 12 hours or as directed by MD 60 patch 2   lubiprostone  (AMITIZA ) 8 MCG capsule Take 8 mcg by mouth every morning.     montelukast  (SINGULAIR ) 10 MG tablet Take 1 tablet (10 mg total) by mouth daily. 100 tablet 2   Multiple Vitamin (MULTI-VITAMINS) TABS Take 1 tablet by mouth daily.     oxyCODONE -acetaminophen  (PERCOCET) 10-325 MG tablet Take 1 tablet by mouth every 8 (eight) hours as needed for pain. 30 tablet 0   pantoprazole  (PROTONIX ) 40 MG tablet Take 1 tablet (40 mg total) by mouth 2 (two) times daily. 180 tablet 1   primidone  (MYSOLINE ) 50 MG tablet TAKE 1 TABLET BY MOUTH TWICE  DAILY 200 tablet 2   Propylene Glycol (SYSTANE BALANCE) 0.6 % SOLN Place 1 drop into both eyes daily as needed (dry eyes). 15 mL 3   Tiotropium Bromide Monohydrate  (SPIRIVA  RESPIMAT) 2.5 MCG/ACT AERS USE 1 INHALATION BY MOUTH  TWICE DAILY 12 g 3   Vitamin D , Ergocalciferol , (DRISDOL ) 1.25 MG (50000 UNIT) CAPS capsule Take 1 capsule (50,000 Units total) by mouth every Wednesday. 15 capsule 3   No current facility-administered medications on file prior to visit.   Past Medical History:  Diagnosis Date   Adrenal insufficiency    Anxiety    Arthritis    Carpal tunnel syndrome    Both hands, worse in right   Chronic constipation    COPD (chronic  obstructive pulmonary disease) (HCC)    CVA (cerebral vascular accident) (HCC)    Depression    Facet syndrome    Fibromyalgia    GERD (gastroesophageal reflux disease)    Heart murmur    Hypercholesteremia    IBS (irritable bowel syndrome)    Ischemic stroke (HCC)    07/19/2004   Lumbar radiculopathy    Osteoporosis    Radiculopathy, lumbar region 05/02/2021   RLS (restless legs syndrome)    Spinal stenosis    Thyroid  disease    TMJ (temporomandibular joint disorder)    Trochanteric bursitis of both hips    Vascular malformation    spinal cord   Past Surgical History:  Procedure Laterality Date   APPENDECTOMY     CARPAL TUNNEL RELEASE Right 12/13/2017   surgery   CATARACT EXTRACTION Bilateral    CHOLECYSTECTOMY     COLONOSCOPY  05/06/2017   Colonic polyps status post polypectomy. Interan land external hemorrhoids.    ESOPHAGOGASTRODUODENOSCOPY  04/28/2007   Mild gastrtiis. Otherwise normal EGD.    HEMORROIDECTOMY     LAMINECTOMY AND MICRODISCECTOMY LUMBAR SPINE  01/03/2005   NECK SURGERY     x2    OTHER SURGICAL HISTORY  04/24/2004   anterior cervical discectomy and fusion at C5-C6   SPINAL CORD STIMULATOR INSERTION N/A 07/21/2021   Procedure: Permanant Spinal cord stimulator placement leads and battery under fluroscopy;  Surgeon: Darlis Deatrice RAMAN, MD;  Location: [facility] OR;  Service: Neurosurgery;  Laterality: N/A;   TONSILLECTOMY     TOTAL ABDOMINAL HYSTERECTOMY      Family History  Problem Relation Age of Onset   Supraventricular tachycardia Mother    Dementia Mother    Hyperlipidemia Mother    Hypertension Mother    Osteoarthritis Mother    Osteoporosis Mother    Throat cancer Father    Breast cancer Other    Colon cancer Neg Hx    Esophageal cancer Neg Hx    Social History  Socioeconomic History   Marital status: Divorced    Spouse name: Not on file   Number of children: 1   Years of education: Not on file   Highest education level: High school  graduate  Occupational History   Occupation: Disabled  Tobacco Use   Smoking status: Every Day    Current packs/day: 0.50    Types: Cigarettes   Smokeless tobacco: Never  Vaping Use   Vaping status: Never Used  Substance and Sexual Activity   Alcohol use: Not Currently    Comment: quit 19 years   Drug use: Never   Sexual activity: Not Currently  Other Topics Concern   Not on file  Social History Narrative   Disabled son lives with her sometimes and his father sometimes.  Nazarene is working hard to have a relationship with her son's father and his wife.  They help her when needed.   Social Drivers of Corporate Investment Banker Strain: Low Risk  (05/14/2023)   Overall Financial Resource Strain (CARDIA)    Difficulty of Paying Living Expenses: Not hard at all  Food Insecurity: Low Risk  (03/02/2024)   Received from [facility]   Hunger Vital Sign    Within the past 12 months, you worried that your food would run out before you got money to buy more: Never true    Within the past 12 months, the food you bought just didn't last and you didn't have money to get more. : Never true  Transportation Needs: No Transportation Needs (03/02/2024)   Received from [facility]    In the past 12 months, has lack of reliable transportation kept you from medical appointments, meetings, work or from getting things needed for daily living? : No  Physical Activity: Inactive (10/31/2023)   Exercise Vital Sign    Days of Exercise per Week: 0 days    Minutes of Exercise per Session: 0 min  Stress: Stress Concern Present (05/14/2023)   [employer] of Occupational Health - Occupational Stress Questionnaire    Feeling of Stress : Rather much  Social Connections: Socially Isolated (05/14/2023)   Social Connection and Isolation Panel    Frequency of Communication with Friends and Family: More than three times a week    Frequency of Social Gatherings with Friends and Family: More  than three times a week    Attends Religious Services: Never    Database Administrator or Organizations: No    Attends Engineer, Structural: Never    Marital Status: Divorced    Objective:  BP 102/78 (BP Location: Left Arm, Patient Position: Sitting)   Pulse (!) 106   Temp 97.8 F (36.6 C) (Temporal)   Ht 5' 1.5 (1.562 m)   Wt 142 lb (64.4 kg)   SpO2 91%   BMI 26.40 kg/m      04/20/2024   10:07 AM 03/19/2024   10:30 AM 03/10/2024   10:42 AM  BP/Weight  Systolic BP 102 126 112  Diastolic BP 78 68 64  Wt. (Lbs) 142 145 142  BMI 26.4 kg/m2 26.95 kg/m2 26.4 kg/m2    Physical Exam Vitals reviewed.  Constitutional:      Appearance: Normal appearance. She is normal weight.  HENT:     Right Ear: Tympanic membrane normal.     Left Ear: Tympanic membrane normal.     Nose: No congestion.     Mouth/Throat:     Pharynx: No oropharyngeal exudate or posterior  oropharyngeal erythema.     Comments: hoarse Neck:     Vascular: No carotid bruit.  Cardiovascular:     Rate and Rhythm: Normal rate and regular rhythm.     Heart sounds: Normal heart sounds.  Pulmonary:     Effort: Pulmonary effort is normal. No respiratory distress.     Breath sounds: Normal breath sounds.  Abdominal:     General: Abdomen is flat. Bowel sounds are normal.     Palpations: Abdomen is soft.     Tenderness: There is no abdominal tenderness.  Neurological:     Mental Status: She is alert and oriented to person, place, and time.  Psychiatric:        Mood and Affect: Mood normal.        Behavior: Behavior normal.         Lab Results  Component Value Date   WBC 10.9 (H) 04/20/2024   HGB 13.5 04/20/2024   HCT 42.6 04/20/2024   PLT 681 (H) 04/20/2024   GLUCOSE 99 04/20/2024   CHOL 196 04/20/2024   TRIG 108 04/20/2024   HDL 91 04/20/2024   LDLCALC 86 04/20/2024   ALT 25 04/20/2024   AST 22 04/20/2024   NA 143 04/20/2024   K 4.4 04/20/2024   CL 103 04/20/2024   CREATININE 0.78  04/20/2024   BUN 8 04/20/2024   CO2 23 04/20/2024   TSH 1.470 04/20/2024   HGBA1C 5.4 04/20/2024    Results for orders placed or performed in visit on 04/20/24  CBC with Differential/Platelet   Collection Time: 04/20/24 11:13 AM  Result Value Ref Range   WBC 10.9 (H) 3.4 - 10.8 x10E3/uL   RBC 4.56 3.77 - 5.28 x10E6/uL   Hemoglobin 13.5 11.1 - 15.9 g/dL   Hematocrit 57.3 65.9 - 46.6 %   MCV 93 79 - 97 fL   MCH 29.6 26.6 - 33.0 pg   MCHC 31.7 31.5 - 35.7 g/dL   RDW 80.8 (H) 88.2 - 84.5 %   Platelets 681 (H) 150 - 450 x10E3/uL   Neutrophils 74 Not Estab. %   Lymphs 16 Not Estab. %   Monocytes 7 Not Estab. %   Eos 1 Not Estab. %   Basos 1 Not Estab. %   Neutrophils Absolute 8.1 (H) 1.4 - 7.0 x10E3/uL   Lymphocytes Absolute 1.8 0.7 - 3.1 x10E3/uL   Monocytes Absolute 0.7 0.1 - 0.9 x10E3/uL   EOS (ABSOLUTE) 0.1 0.0 - 0.4 x10E3/uL   Basophils Absolute 0.1 0.0 - 0.2 x10E3/uL   Immature Granulocytes 1 Not Estab. %   Immature Grans (Abs) 0.1 0.0 - 0.1 x10E3/uL  Comprehensive metabolic panel with GFR   Collection Time: 04/20/24 11:13 AM  Result Value Ref Range   Glucose 99 70 - 99 mg/dL   BUN 8 8 - 27 mg/dL   Creatinine, Ser 9.21 0.57 - 1.00 mg/dL   eGFR 82 >40 fO/fpw/8.26   BUN/Creatinine Ratio 10 (L) 12 - 28   Sodium 143 134 - 144 mmol/L   Potassium 4.4 3.5 - 5.2 mmol/L   Chloride 103 96 - 106 mmol/L   CO2 23 20 - 29 mmol/L   Calcium  9.6 8.7 - 10.3 mg/dL   Total Protein 6.5 6.0 - 8.5 g/dL   Albumin 4.2 3.9 - 4.9 g/dL   Globulin, Total 2.3 1.5 - 4.5 g/dL   Bilirubin Total <9.7 0.0 - 1.2 mg/dL   Alkaline Phosphatase 54 49 - 135 IU/L   AST 22 0 -  40 IU/L   ALT 25 0 - 32 IU/L  Hemoglobin A1c   Collection Time: 04/20/24 11:13 AM  Result Value Ref Range   Hgb A1c MFr Bld 5.4 4.8 - 5.6 %   Est. average glucose Bld gHb Est-mCnc 108 mg/dL  T4, free   Collection Time: 04/20/24 11:13 AM  Result Value Ref Range   Free T4 1.24 0.82 - 1.77 ng/dL  TSH   Collection Time: 04/20/24  11:13 AM  Result Value Ref Range   TSH 1.470 0.450 - 4.500 uIU/mL  Lipid panel   Collection Time: 04/20/24 11:13 AM  Result Value Ref Range   Cholesterol, Total 196 100 - 199 mg/dL   Triglycerides 891 0 - 149 mg/dL   HDL 91 >60 mg/dL   VLDL Cholesterol Cal 19 5 - 40 mg/dL   LDL Chol Calc (NIH) 86 0 - 99 mg/dL   Chol/HDL Ratio 2.2 0.0 - 4.4 ratio  .  Assessment & Plan:   Assessment & Plan Mixed hyperlipidemia Managed with atorvastatin  and fenofibrate . - Continue atorvastatin  and fenofibrate  for cholesterol management. Orders:   CBC with Differential/Platelet   Comprehensive metabolic panel with GFR   Lipid panel  Mild recurrent major depression Experiencing anxiety and feeling overwhelmed due to caregiving responsibilities. Currently on Abilify  and duloxetine  for mood stabilization. Hydroxyzine  used as needed for anxiety. - Continue Abilify  and duloxetine  for mood stabilization. - Continue hydroxyzine  as needed for anxiety. - Discussed potential benefits of counseling, though she is hesitant due to past experiences.    Localized edema Reports swelling in the ankle, possibly exacerbated by activity. Compression socks recommended but not currently used. - Recommended wearing compression socks to manage edema. - Advised elevating legs when possible.    Primary insomnia Reports difficulty sleeping, likely related to anxiety and caregiving responsibilities. - Address underlying anxiety and stress management to improve sleep quality.    Acquired hypothyroidism Managed with levothyroxine . - Continue levothyroxine  as prescribed. Orders:   T4, free   TSH  Simple chronic bronchitis (HCC) Managed with Spiriva  and Singulair . Reports hoarseness and cough, possibly related to smoking and illness. - Continue Spiriva  and Singulair  for respiratory management. - Encouraged smoking cessation to improve respiratory health.    Prediabetes Discussed dietary habits and importance of  healthy eating. - Encouraged healthy eating habits, including homemade soups with vegetables. Orders:   Hemoglobin A1c  Chronic midline low back pain without sciatica Chronic back pain managed with Percocet and cyclobenzaprine . Scheduled for a spinal injection next month. - Continue Percocet and cyclobenzaprine  for pain management. - Proceed with scheduled spinal injection.    Cigarette smoker Discussed importance of smoking cessation for overall health improvement. She is working on quitting and using mints. Hesitant about using patches or counseling programs. - Encouraged smoking cessation efforts. - Discussed potential benefits of smoking cessation programs, though she is hesitant.    Tremor Managed with primidone . - Continue primidone  for tremor management.    Encounter for immunization  Orders:   Pfizer Comirnaty Covid-19 Vaccine 56yrs & older   Body mass index is 26.4 kg/m.   No orders of the defined types were placed in this encounter.   Orders Placed This Encounter  Procedures   Pfizer Comirnaty Covid-19 Vaccine 48yrs & older   CBC with Differential/Platelet   Comprehensive metabolic panel with GFR   Hemoglobin A1c   T4, free   TSH   Lipid panel    I,Marla I Leal-Borjas,acting as a scribe for Abigail Free, MD.,have documented all  relevant documentation on the behalf of Abigail Free, MD,as directed by  Abigail Free, MD while in the presence of Abigail Free, MD.   Follow-up: Return in about 3 months (around 07/21/2024) for chronic follow up.  An After Visit Summary was printed and given to the patient.  Total time spent on today's visit was 40 minutes, including both face-to-face time and nonface-to-face time personally spent on review of chart (labs and imaging), discussing labs and goals, discussing further work-up, treatment options, referrals to specialist if needed, reviewing outside records of pertinent, answering patient's questions, and coordinating  care.  I attest that I have reviewed this visit and agree with the plan scribed by my staff.   Abigail Free, MD [facility] [phone]

## 2024-04-20 NOTE — Assessment & Plan Note (Signed)
  Orders:   T4, free   TSH

## 2024-04-20 NOTE — Assessment & Plan Note (Signed)
  Orders:   CBC with Differential/Platelet   Comprehensive metabolic panel with GFR   Lipid panel

## 2024-04-20 NOTE — Assessment & Plan Note (Deleted)
 Chronic back pain managed with Percocet and cyclobenzaprine . Scheduled for a spinal injection next month. - Continue Percocet and cyclobenzaprine  for pain management. - Proceed with scheduled spinal injection.

## 2024-04-20 NOTE — Patient Instructions (Addendum)
  VISIT SUMMARY: Today, you had a routine wellness visit where we discussed your general health and various ongoing health issues.  YOUR PLAN: DEPRESSION AND ANXIETY: You are experiencing anxiety and feeling overwhelmed due to caregiving responsibilities. -Continue taking Abilify  and duloxetine  for mood stabilization. -Continue using hydroxyzine  as needed for anxiety. -Consider counseling for additional support, though you are hesitant due to past experiences.  INSOMNIA: You have difficulty sleeping, likely related to anxiety and caregiving responsibilities. -Address underlying anxiety and stress management to improve sleep quality.  CHRONIC BACK PAIN AND MUSCLE SPASM: You have chronic back pain managed with Percocet and cyclobenzaprine . -Continue taking Percocet and cyclobenzaprine  for pain management. -Proceed with the scheduled spinal injection next month.  EDEMA OF LOWER EXTREMITY: You have swelling in your ankles, possibly exacerbated by activity. -Wear compression socks to manage edema. -Elevate your legs when possible.  MIXED HYPERLIPIDEMIA: You have high cholesterol managed with atorvastatin  and fenofibrate . -Continue taking atorvastatin  and fenofibrate  for cholesterol management.  ATHEROSCLEROSIS OF AORTA: You have atherosclerosis managed with aspirin  and atorvastatin . -Continue taking aspirin  and atorvastatin  for cardiovascular health.  PRIMARY ADRENOCORTICAL INSUFFICIENCY: You have primary adrenocortical insufficiency managed with hydrocortisone . -Continue taking hydrocortisone  as prescribed.  HYPOTHYROIDISM: You have hypothyroidism managed with levothyroxine . -Continue taking levothyroxine  as prescribed.  CHRONIC BRONCHITIS AND COPD: You have chronic bronchitis and COPD managed with Spiriva  and Singulair . -Continue taking Spiriva  and Singulair  for respiratory management. -Work on  smoking cessation to improve respiratory health.  PREDIABETES: We discussed your dietary habits and the importance of healthy eating. -Continue healthy eating habits, including homemade soups with vegetables.  TREMOR: You have tremors managed with primidone . -Continue taking primidone  for tremor management.  CONSTIPATION: You have constipation managed with Amitiza . -Continue taking Amitiza  for constipation management.  ALLERGIC RHINITIS: You have allergic rhinitis managed with azelastine  and Flonase  nasal sprays. -Continue using azelastine  and Flonase  nasal sprays for allergy  management.  SMOKING CESSATION COUNSELING: We discussed the importance of smoking cessation for overall health improvement. -Continue your efforts to quit smoking. -Consider the benefits of smoking cessation programs, though you are hesitant.  GASTROESOPHAGEAL REFLUX DISEASE (GERD): You have GERD managed with Protonix . -Continue taking Protonix  twice daily for GERD management.                      Contains text generated by [employer].                                 Contains text generated by [employer].

## 2024-04-20 NOTE — Assessment & Plan Note (Signed)
 SABRA

## 2024-04-21 ENCOUNTER — Ambulatory Visit: Payer: Self-pay | Admitting: Family Medicine

## 2024-04-21 DIAGNOSIS — R251 Tremor, unspecified: Secondary | ICD-10-CM | POA: Insufficient documentation

## 2024-04-21 DIAGNOSIS — Z0000 Encounter for general adult medical examination without abnormal findings: Secondary | ICD-10-CM | POA: Insufficient documentation

## 2024-04-21 DIAGNOSIS — R7303 Prediabetes: Secondary | ICD-10-CM | POA: Insufficient documentation

## 2024-04-21 LAB — COMPREHENSIVE METABOLIC PANEL WITH GFR
ALT: 25 IU/L (ref 0–32)
AST: 22 IU/L (ref 0–40)
Albumin: 4.2 g/dL (ref 3.9–4.9)
Alkaline Phosphatase: 54 IU/L (ref 49–135)
BUN/Creatinine Ratio: 10 — ABNORMAL LOW (ref 12–28)
BUN: 8 mg/dL (ref 8–27)
Bilirubin Total: <0.2 mg/dL (ref 0.0–1.2)
CO2: 23 mmol/L (ref 20–29)
Calcium: 9.6 mg/dL (ref 8.7–10.3)
Chloride: 103 mmol/L (ref 96–106)
Creatinine, Ser: 0.78 mg/dL (ref 0.57–1.00)
Globulin, Total: 2.3 g/dL (ref 1.5–4.5)
Glucose: 99 mg/dL (ref 70–99)
Potassium: 4.4 mmol/L (ref 3.5–5.2)
Sodium: 143 mmol/L (ref 134–144)
Total Protein: 6.5 g/dL (ref 6.0–8.5)
eGFR: 82 mL/min/1.73 (ref >59)

## 2024-04-21 LAB — CBC WITH DIFFERENTIAL/PLATELET
Basophils Absolute: 0.1 x10E3/uL (ref 0.0–0.2)
Basos: 1 %
EOS (ABSOLUTE): 0.1 x10E3/uL (ref 0.0–0.4)
Eos: 1 %
Hematocrit: 42.6 % (ref 34.0–46.6)
Hemoglobin: 13.5 g/dL (ref 11.1–15.9)
Immature Grans (Abs): 0.1 x10E3/uL (ref 0.0–0.1)
Immature Granulocytes: 1 %
Lymphocytes Absolute: 1.8 x10E3/uL (ref 0.7–3.1)
Lymphs: 16 %
MCH: 29.6 pg (ref 26.6–33.0)
MCHC: 31.7 g/dL (ref 31.5–35.7)
MCV: 93 fL (ref 79–97)
Monocytes Absolute: 0.7 x10E3/uL (ref 0.1–0.9)
Monocytes: 7 %
Neutrophils Absolute: 8.1 x10E3/uL — ABNORMAL HIGH (ref 1.4–7.0)
Neutrophils: 74 %
Platelets: 681 x10E3/uL — ABNORMAL HIGH (ref 150–450)
RBC: 4.56 x10E6/uL (ref 3.77–5.28)
RDW: 19.1 % — ABNORMAL HIGH (ref 11.7–15.4)
WBC: 10.9 x10E3/uL — ABNORMAL HIGH (ref 3.4–10.8)

## 2024-04-21 LAB — TSH: TSH: 1.47 u[IU]/mL (ref 0.450–4.500)

## 2024-04-21 LAB — LIPID PANEL
Chol/HDL Ratio: 2.2 ratio (ref 0.0–4.4)
Cholesterol, Total: 196 mg/dL (ref 100–199)
HDL: 91 mg/dL (ref >39)
LDL Chol Calc (NIH): 86 mg/dL (ref 0–99)
Triglycerides: 108 mg/dL (ref 0–149)
VLDL Cholesterol Cal: 19 mg/dL (ref 5–40)

## 2024-04-21 LAB — HEMOGLOBIN A1C
Est. average glucose Bld gHb Est-mCnc: 108 mg/dL
Hgb A1c MFr Bld: 5.4 % (ref 4.8–5.6)

## 2024-04-21 LAB — T4, FREE: Free T4: 1.24 ng/dL (ref 0.82–1.77)

## 2024-04-21 NOTE — Assessment & Plan Note (Addendum)
 Chronic back pain managed with Percocet and cyclobenzaprine . Scheduled for a spinal injection next month. - Continue Percocet and cyclobenzaprine  for pain management. - Proceed with scheduled spinal injection.

## 2024-04-21 NOTE — Assessment & Plan Note (Addendum)
 Managed with primidone . - Continue primidone  for tremor management.

## 2024-04-21 NOTE — Assessment & Plan Note (Deleted)
 Managed with Amitiza . - Continue Amitiza  for constipation management.

## 2024-04-21 NOTE — Assessment & Plan Note (Addendum)
 Discussed importance of smoking cessation for overall health improvement. She is working on quitting and using mints. Hesitant about using patches or counseling programs. - Encouraged smoking cessation efforts. - Discussed potential benefits of smoking cessation programs, though she is hesitant.

## 2024-04-21 NOTE — Assessment & Plan Note (Deleted)
 Routine wellness visit with discussion on general health maintenance. - Encouraged healthy eating habits, including homemade soups with vegetables. - Advised walking around the house as tolerated.

## 2024-04-21 NOTE — Assessment & Plan Note (Addendum)
 Discussed dietary habits and importance of healthy eating. - Encouraged healthy eating habits, including homemade soups with vegetables. Orders:   Hemoglobin A1c

## 2024-04-21 NOTE — Assessment & Plan Note (Deleted)
 Managed with azelastine  and Flonase  nasal sprays. - Continue azelastine  and Flonase  nasal sprays for allergy  management.

## 2024-04-21 NOTE — Assessment & Plan Note (Deleted)
 Managed with Protonix . Reports difficulty tolerating once-daily dosing. - Continue Protonix  twice daily for GERD management.

## 2024-05-04 ENCOUNTER — Ambulatory Visit: Admitting: Family Medicine

## 2024-05-04 ENCOUNTER — Encounter: Payer: Self-pay | Admitting: Family Medicine

## 2024-05-04 ENCOUNTER — Ambulatory Visit: Payer: Self-pay

## 2024-05-04 ENCOUNTER — Telehealth: Payer: Self-pay | Admitting: Family Medicine

## 2024-05-04 VITALS — BP 102/62 | HR 107 | Temp 97.8°F | Ht 61.5 in | Wt 144.2 lb

## 2024-05-04 DIAGNOSIS — J441 Chronic obstructive pulmonary disease with (acute) exacerbation: Secondary | ICD-10-CM | POA: Insufficient documentation

## 2024-05-04 DIAGNOSIS — E2740 Unspecified adrenocortical insufficiency: Secondary | ICD-10-CM

## 2024-05-04 DIAGNOSIS — J208 Acute bronchitis due to other specified organisms: Secondary | ICD-10-CM | POA: Insufficient documentation

## 2024-05-04 MED ORDER — PROMETHAZINE-DM 6.25-15 MG/5ML PO SYRP: 5.0000 mL | ORAL_SOLUTION | Freq: Four times a day (QID) | ORAL | 0 refills | Status: DC | PRN

## 2024-05-04 MED ORDER — CEFTRIAXONE SODIUM 1 G IJ SOLR
1.0000 g | Freq: Once | INTRAMUSCULAR | Status: AC
  Administered 2024-05-04: 1 g via INTRAMUSCULAR

## 2024-05-04 MED ORDER — DOXYCYCLINE HYCLATE 100 MG PO TABS: 100.0000 mg | ORAL_TABLET | Freq: Two times a day (BID) | ORAL | 0 refills | Status: AC

## 2024-05-04 MED ORDER — TRIAMCINOLONE ACETONIDE 40 MG/ML IJ SUSP
80.0000 mg | Freq: Once | INTRAMUSCULAR | Status: AC
  Administered 2024-05-04: 80 mg via INTRAMUSCULAR

## 2024-05-04 MED ORDER — PREDNISONE 10 MG PO TABS: ORAL_TABLET | ORAL | 0 refills | Status: AC

## 2024-05-04 NOTE — Assessment & Plan Note (Signed)
 Acute exacerbation of COPD with acute bronchitis.  - Administered IM Rocephin  shot. - Administered IM steroid shot. - Prescribed prednisone . - Continue Cortef  for adrenal insufficiency. - Prescribed doxycycline . - Continue using albuterol  inhaler as needed. - Continue using Spiriva  as maintenance therapy. - Continue using nasal spray in the morning and evening. - Use Phenergan  with dextromethorphan syrup as needed for cough. - Continue mucinexDM.

## 2024-05-04 NOTE — Assessment & Plan Note (Signed)
 Managed with Cortef . - Continue Cortef  as prescribed.

## 2024-05-04 NOTE — Patient Instructions (Signed)
  VISIT SUMMARY: Today, you were seen for a cough with yellow sputum production and an exacerbation of your chronic obstructive pulmonary disease (COPD). You received treatments to help manage your symptoms and were given new prescriptions to aid in your recovery.  YOUR PLAN: CHRONIC OBSTRUCTIVE PULMONARY DISEASE (COPD) WITH ACUTE EXACERBATION AND ACUTE BRONCHITIS: You have an acute exacerbation of your COPD along with acute bronchitis, which has caused your recent symptoms. -You received an IM Rocephin  shot and an IM steroid shot today. -You were prescribed prednisone  to help reduce inflammation. -Continue taking Cortef  for your adrenal insufficiency as prescribed. -You were prescribed doxycycline  to treat the infection. -Continue using your albuterol  inhaler as needed for relief. -Continue using Spiriva  daily as maintenance therapy. -Continue using your nasal spray in the morning and evening. -Use Phenergan  with dextromethorphan syrup as needed for your cough. - Continue MucinexDM otc.   ADRENAL INSUFFICIENCY: You have a condition called adrenal insufficiency, which requires ongoing management. -Continue taking Cortef  as prescribed to manage your adrenal insufficiency.                      Contains text generated by Abridge.                                 Contains text generated by Abridge.

## 2024-05-04 NOTE — Progress Notes (Signed)
 Acute Office Visit  Subjective:    Patient ID: Connie Coffey, female    DOB: 1956-01-13, 68 y.o.   MRN: [id number]  Chief Complaint  Patient presents with   Cough   Discussed the use of AI scribe software for clinical note transcription with the patient, who gave verbal consent to proceed.  History of Present Illness Connie Coffey is a 68 year old female with respiratory issues who presents with a cough and yellow sputum production.  Cough and sputum production - Cough with yellow sputum production ongoing since the weekend - No fever - Coughing exacerbates back pain - No sinus pain - Frequent nasal blowing  Respiratory medication use and response - Frequent use of albuterol  inhaler over the weekend without relief - Regular use of Spiriva  - Nasal spray applied in the morning and evening - Cough syrup alleviates throat tickle, especially on the left side, but not available at visit - Sugar-free cough drops and mints provide symptomatic relief and support smoking cessation  Adrenal insufficiency and steroid use - On Cortef  for adrenal insufficiency  Medication allergies - Allergic to penicillin - Allergic to NSAIDs   Past Medical History:  Diagnosis Date   Adrenal insufficiency    Anxiety    Arthritis    Carpal tunnel syndrome    Both hands, worse in right   Chronic constipation    COPD (chronic obstructive pulmonary disease) (HCC)    CVA (cerebral vascular accident) (HCC)    Depression    Facet syndrome    Fibromyalgia    GERD (gastroesophageal reflux disease)    Heart murmur    Hypercholesteremia    IBS (irritable bowel syndrome)    Ischemic stroke (HCC)    07/19/2004   Lumbar radiculopathy    Osteoporosis    Radiculopathy, lumbar region 05/02/2021   RLS (restless legs syndrome)    Spinal stenosis    Thyroid  disease    TMJ (temporomandibular joint disorder)    Trochanteric bursitis of both hips    Vascular malformation    spinal cord    Past  Surgical History:  Procedure Laterality Date   APPENDECTOMY     CARPAL TUNNEL RELEASE Right 12/13/2017   surgery   CATARACT EXTRACTION Bilateral    CHOLECYSTECTOMY     COLONOSCOPY  05/06/2017   Colonic polyps status post polypectomy. Interan land external hemorrhoids.    ESOPHAGOGASTRODUODENOSCOPY  04/28/2007   Mild gastrtiis. Otherwise normal EGD.    HEMORROIDECTOMY     LAMINECTOMY AND MICRODISCECTOMY LUMBAR SPINE  01/03/2005   NECK SURGERY     x2    OTHER SURGICAL HISTORY  04/24/2004   anterior cervical discectomy and fusion at C5-C6   SPINAL CORD STIMULATOR INSERTION N/A 07/21/2021   Procedure: Permanant Spinal cord stimulator placement leads and battery under fluroscopy;  Surgeon: Darlis Deatrice RAMAN, MD;  Location: [facility];  Service: Neurosurgery;  Laterality: N/A;   TONSILLECTOMY     TOTAL ABDOMINAL HYSTERECTOMY      Family History  Problem Relation Age of Onset   Supraventricular tachycardia Mother    Dementia Mother    Hyperlipidemia Mother    Hypertension Mother    Osteoarthritis Mother    Osteoporosis Mother    Throat cancer Father    Breast cancer Other    Colon cancer Neg Hx    Esophageal cancer Neg Hx     Social History   Socioeconomic History   Marital status: Divorced    Spouse name: Not on  file   Number of children: 1   Years of education: Not on file   Highest education level: High school graduate  Occupational History   Occupation: Disabled  Tobacco Use   Smoking status: Every Day    Current packs/day: 0.50    Types: Cigarettes   Smokeless tobacco: Never  Vaping Use   Vaping status: Never Used  Substance and Sexual Activity   Alcohol use: Not Currently    Comment: quit 19 years   Drug use: Never   Sexual activity: Not Currently  Other Topics Concern   Not on file  Social History Narrative   Disabled son lives with her sometimes and his father sometimes.  Giah is working hard to have a relationship with her son's father and his wife.  They  help her when needed.   Social Drivers of Corporate Investment Banker Strain: Low Risk  (05/14/2023)   Overall Financial Resource Strain (CARDIA)    Difficulty of Paying Living Expenses: Not hard at all  Food Insecurity: Low Risk (03/02/2024)   Received from [facility]   Hunger Vital Sign    Within the past 12 months, you worried that your food would run out before you got money to buy more: Never true    Within the past 12 months, the food you bought just didn't last and you didn't have money to get more. : Never true  Transportation Needs: No Transportation Needs (03/02/2024)   Received from [facility]    In the past 12 months, has lack of reliable transportation kept you from medical appointments, meetings, work or from getting things needed for daily living? : No  Physical Activity: Inactive (10/31/2023)   Exercise Vital Sign    Days of Exercise per Week: 0 days    Minutes of Exercise per Session: 0 min  Stress: Stress Concern Present (05/14/2023)   [employer] of Occupational Health - Occupational Stress Questionnaire    Feeling of Stress : Rather much  Social Connections: Socially Isolated (05/14/2023)   Social Connection and Isolation Panel    Frequency of Communication with Friends and Family: More than three times a week    Frequency of Social Gatherings with Friends and Family: More than three times a week    Attends Religious Services: Never    Database Administrator or Organizations: No    Attends Banker Meetings: Never    Marital Status: Divorced  Catering Manager Violence: Not At Risk (02/24/2024)   Humiliation, Afraid, Rape, and Kick questionnaire    Fear of Current or Ex-Partner: No    Emotionally Abused: No    Physically Abused: No    Sexually Abused: No    Outpatient Medications Prior to Visit  Medication Sig Dispense Refill   albuterol  (VENTOLIN  HFA) 108 (90 Base) MCG/ACT inhaler Inhale 2 puffs into the lungs every 6  (six) hours as needed for wheezing or shortness of breath. 54 each 3   alendronate (FOSAMAX) 70 MG tablet Take 70 mg by mouth once a week.     ARIPiprazole  (ABILIFY ) 5 MG tablet Take 1 tablet (5 mg total) by mouth daily. 90 tablet 3   aspirin  EC 81 MG tablet Take 1 tablet (81 mg total) by mouth daily. Swallow whole. 90 tablet 3   atorvastatin  (LIPITOR) 80 MG tablet TAKE 1 TABLET BY MOUTH DAILY 100 tablet 2   Azelastine  HCl 137 MCG/SPRAY SOLN Place 2 sprays into the nose in the morning  and at bedtime. 90 mL 3   bethanechol  (URECHOLINE ) 25 MG tablet TAKE 1 TABLET BY MOUTH IN THE  MORNING AND AT BEDTIME 200 tablet 2   busPIRone  (BUSPAR ) 30 MG tablet TAKE 1 TABLET BY MOUTH TWICE A DAY 180 tablet 1   cetirizine  (ZYRTEC ) 10 MG tablet TAKE 1 TABLET BY MOUTH EVERYDAY AT BEDTIME 90 tablet 2   cyclobenzaprine  (FLEXERIL ) 10 MG tablet Take 1 tablet (10 mg total) by mouth 3 (three) times daily. 270 tablet 1   dicyclomine  (BENTYL ) 20 MG tablet Take 1 tablet (20 mg total) by mouth 4 (four) times daily. 360 tablet 1   DULoxetine  (CYMBALTA ) 60 MG capsule Take 1 capsule (60 mg total) by mouth daily. 90 capsule 1   fenofibrate  160 MG tablet TAKE 1 TABLET BY MOUTH DAILY 100 tablet 2   fluticasone  (FLONASE ) 50 MCG/ACT nasal spray Place 1 spray into both nostrils daily. 48 mL 3   furosemide  (LASIX ) 20 MG tablet TAKE 1 TABLET BY MOUTH DAILY 100 tablet 2   gabapentin  (NEURONTIN ) 600 MG tablet TAKE 1 TABLET BY MOUTH 4 TIMES  DAILY 400 tablet 2   hydrocortisone  (CORTEF ) 10 MG tablet Take 1 tablet (10 mg total) by mouth 2 (two) times daily. 180 tablet 1   hydrOXYzine  (VISTARIL ) 25 MG capsule TAKE 1 CAPSULE (25 MG TOTAL) BY MOUTH EVERY 8 (EIGHT) HOURS AS NEEDED FOR ANXIETY OR ITCHING. 270 capsule 1   levothyroxine  (SYNTHROID ) 50 MCG tablet Take 1 tablet (50 mcg total) by mouth daily. 90 tablet 3   lidocaine  (LIDODERM ) 5 % Place 2 patches onto the skin daily. Remove & Discard patch within 12 hours or as directed by MD 60  patch 2   lubiprostone  (AMITIZA ) 8 MCG capsule Take 8 mcg by mouth every morning.     montelukast  (SINGULAIR ) 10 MG tablet Take 1 tablet (10 mg total) by mouth daily. 100 tablet 2   Multiple Vitamin (MULTI-VITAMINS) TABS Take 1 tablet by mouth daily.     oxyCODONE -acetaminophen  (PERCOCET) 10-325 MG tablet Take 1 tablet by mouth every 8 (eight) hours as needed for pain. 30 tablet 0   pantoprazole  (PROTONIX ) 40 MG tablet Take 1 tablet (40 mg total) by mouth 2 (two) times daily. 180 tablet 1   primidone  (MYSOLINE ) 50 MG tablet TAKE 1 TABLET BY MOUTH TWICE  DAILY 200 tablet 2   Propylene Glycol (SYSTANE BALANCE) 0.6 % SOLN Place 1 drop into both eyes daily as needed (dry eyes). 15 mL 3   Tiotropium Bromide Monohydrate  (SPIRIVA  RESPIMAT) 2.5 MCG/ACT AERS USE 1 INHALATION BY MOUTH  TWICE DAILY 12 g 3   Vitamin D , Ergocalciferol , (DRISDOL ) 1.25 MG (50000 UNIT) CAPS capsule Take 1 capsule (50,000 Units total) by mouth every Wednesday. 15 capsule 3   No facility-administered medications prior to visit.    Allergies  Allergen Reactions   Beta Vulgaris Anaphylaxis    Beets    Penicillins Shortness Of Breath   Cat Dander    Celecoxib Other (See Comments)    because of stomach ulcers   Chantix [Varenicline]     Makes pt sick on her stomach    Dog Epithelium (Canis Lupus Familiaris)     Cats and dogs   Fish Oil Other (See Comments)    hemorrhoids   Gramineae Pollens    Molds & Smuts    Nsaids Other (See Comments)    GI Upset.  Ulcers   Other Other (See Comments)    beets  Including Celebrex  Red Beet Powder    Tape Dermatitis    Electrodes caused blistering   Nicotine Nausea Only and Other (See Comments)    Review of Systems  Constitutional:  Negative for appetite change, fatigue and fever.  HENT:  Positive for congestion. Negative for ear pain, sinus pressure and sore throat.   Respiratory:  Positive for cough and shortness of breath. Negative for chest tightness and wheezing.    Cardiovascular:  Negative for chest pain and palpitations.  Gastrointestinal:  Negative for abdominal pain, constipation, diarrhea, nausea and vomiting.  Genitourinary:  Negative for dysuria and hematuria.  Musculoskeletal:  Negative for arthralgias, back pain, joint swelling and myalgias.  Skin:  Negative for rash.  Neurological:  Negative for dizziness, weakness and headaches.  Psychiatric/Behavioral:  Negative for dysphoric mood. The patient is not nervous/anxious.        Objective:        05/04/2024    2:08 PM 04/20/2024   10:07 AM 03/19/2024   10:30 AM  Vitals with BMI  Height 5' 1.5 5' 1.5 5' 1.5  Weight 144 lbs 3 oz 142 lbs 145 lbs  BMI 26.81 26.4 26.96  Systolic 102 102 873  Diastolic 62 78 68  Pulse 107 106 99    Orthostatic VS for the past 72 hrs (Last 3 readings):  Patient Position BP Location  05/04/24 1408 Sitting Right Arm     Physical Exam Vitals reviewed.  Constitutional:      Appearance: Normal appearance.  HENT:     Right Ear: Tympanic membrane, ear canal and external ear normal.     Left Ear: Tympanic membrane, ear canal and external ear normal.     Nose: Congestion present.     Mouth/Throat:     Pharynx: Oropharynx is clear.     Comments: hoarse Cardiovascular:     Rate and Rhythm: Normal rate and regular rhythm.     Heart sounds: Normal heart sounds. No murmur heard. Pulmonary:     Effort: Pulmonary effort is normal. No respiratory distress.     Breath sounds: Wheezing (occasional.) present.  Lymphadenopathy:     Cervical: No cervical adenopathy.  Neurological:     Mental Status: She is alert and oriented to person, place, and time.  Psychiatric:        Mood and Affect: Mood normal.        Behavior: Behavior normal.     Health Maintenance Due  Topic Date Due   Zoster Vaccines- Shingrix (1 of 2) 05/04/2004   Medicare Annual Wellness (AWV)  05/13/2024    There are no preventive care reminders to display for this patient.   Lab  Results  Component Value Date   TSH 1.470 04/20/2024   Lab Results  Component Value Date   WBC 10.9 (H) 04/20/2024   HGB 13.5 04/20/2024   HCT 42.6 04/20/2024   MCV 93 04/20/2024   PLT 681 (H) 04/20/2024   Lab Results  Component Value Date   NA 143 04/20/2024   K 4.4 04/20/2024   CO2 23 04/20/2024   GLUCOSE 99 04/20/2024   BUN 8 04/20/2024   CREATININE 0.78 04/20/2024   BILITOT <0.2 04/20/2024   ALKPHOS 54 04/20/2024   AST 22 04/20/2024   ALT 25 04/20/2024   PROT 6.5 04/20/2024   ALBUMIN 4.2 04/20/2024   CALCIUM  9.6 04/20/2024   ANIONGAP 8 07/21/2021   EGFR 82 04/20/2024   Lab Results  Component Value Date   CHOL 196 04/20/2024  Lab Results  Component Value Date   HDL 91 04/20/2024   Lab Results  Component Value Date   LDLCALC 86 04/20/2024   Lab Results  Component Value Date   TRIG 108 04/20/2024   Lab Results  Component Value Date   CHOLHDL 2.2 04/20/2024   Lab Results  Component Value Date   HGBA1C 5.4 04/20/2024        Results for orders placed or performed in visit on 04/20/24  CBC with Differential/Platelet   Collection Time: 04/20/24 11:13 AM  Result Value Ref Range   WBC 10.9 (H) 3.4 - 10.8 x10E3/uL   RBC 4.56 3.77 - 5.28 x10E6/uL   Hemoglobin 13.5 11.1 - 15.9 g/dL   Hematocrit 57.3 65.9 - 46.6 %   MCV 93 79 - 97 fL   MCH 29.6 26.6 - 33.0 pg   MCHC 31.7 31.5 - 35.7 g/dL   RDW 80.8 (H) 88.2 - 84.5 %   Platelets 681 (H) 150 - 450 x10E3/uL   Neutrophils 74 Not Estab. %   Lymphs 16 Not Estab. %   Monocytes 7 Not Estab. %   Eos 1 Not Estab. %   Basos 1 Not Estab. %   Neutrophils Absolute 8.1 (H) 1.4 - 7.0 x10E3/uL   Lymphocytes Absolute 1.8 0.7 - 3.1 x10E3/uL   Monocytes Absolute 0.7 0.1 - 0.9 x10E3/uL   EOS (ABSOLUTE) 0.1 0.0 - 0.4 x10E3/uL   Basophils Absolute 0.1 0.0 - 0.2 x10E3/uL   Immature Granulocytes 1 Not Estab. %   Immature Grans (Abs) 0.1 0.0 - 0.1 x10E3/uL  Comprehensive metabolic panel with GFR   Collection Time:  04/20/24 11:13 AM  Result Value Ref Range   Glucose 99 70 - 99 mg/dL   BUN 8 8 - 27 mg/dL   Creatinine, Ser 9.21 0.57 - 1.00 mg/dL   eGFR 82 >40 fO/fpw/8.26   BUN/Creatinine Ratio 10 (L) 12 - 28   Sodium 143 134 - 144 mmol/L   Potassium 4.4 3.5 - 5.2 mmol/L   Chloride 103 96 - 106 mmol/L   CO2 23 20 - 29 mmol/L   Calcium  9.6 8.7 - 10.3 mg/dL   Total Protein 6.5 6.0 - 8.5 g/dL   Albumin 4.2 3.9 - 4.9 g/dL   Globulin, Total 2.3 1.5 - 4.5 g/dL   Bilirubin Total <9.7 0.0 - 1.2 mg/dL   Alkaline Phosphatase 54 49 - 135 IU/L   AST 22 0 - 40 IU/L   ALT 25 0 - 32 IU/L  Hemoglobin A1c   Collection Time: 04/20/24 11:13 AM  Result Value Ref Range   Hgb A1c MFr Bld 5.4 4.8 - 5.6 %   Est. average glucose Bld gHb Est-mCnc 108 mg/dL  T4, free   Collection Time: 04/20/24 11:13 AM  Result Value Ref Range   Free T4 1.24 0.82 - 1.77 ng/dL  TSH   Collection Time: 04/20/24 11:13 AM  Result Value Ref Range   TSH 1.470 0.450 - 4.500 uIU/mL  Lipid panel   Collection Time: 04/20/24 11:13 AM  Result Value Ref Range   Cholesterol, Total 196 100 - 199 mg/dL   Triglycerides 891 0 - 149 mg/dL   HDL 91 >60 mg/dL   VLDL Cholesterol Cal 19 5 - 40 mg/dL   LDL Chol Calc (NIH) 86 0 - 99 mg/dL   Chol/HDL Ratio 2.2 0.0 - 4.4 ratio     Assessment & Plan:   Assessment & Plan COPD exacerbation (HCC) Acute exacerbation of COPD with acute  bronchitis.  - Administered IM Rocephin  shot. - Administered IM steroid shot. - Prescribed prednisone . - Continue Cortef  for adrenal insufficiency. - Prescribed doxycycline . - Continue using albuterol  inhaler as needed. - Continue using Spiriva  as maintenance therapy. - Continue using nasal spray in the morning and evening. - Use Phenergan  with dextromethorphan syrup as needed for cough. - Continue mucinexDM. Acute bronchitis due to other specified organisms See Above.   Adrenal insufficiency Managed with Cortef . - Continue Cortef  as prescribed.  Follow-up:  Return if symptoms worsen or fail to improve.  An After Visit Summary was printed and given to the patient.   Abigail Free, MD [facility] [phone]

## 2024-05-04 NOTE — Telephone Encounter (Signed)
 Called and spoke with patient and scheduled her to come in for an appointment.

## 2024-05-04 NOTE — Assessment & Plan Note (Signed)
 See Above

## 2024-05-04 NOTE — Telephone Encounter (Addendum)
 FYI Only or Action Required?: Action required by provider: clinical question for provider and update on patient condition.  Patient was last seen in primary care on 04/20/2024 by Sherre Clapper, MD.  Called Nurse Triage reporting Cough.  Symptoms began several days ago.  Interventions attempted: OTC medications: mucinex and Prescription medications: rescue inhaler, all maintenance medication.  Symptoms are: gradually worsening.  Triage Disposition: See PCP When Office is Open (Within 3 Days)  Patient/caregiver understands and will follow disposition?:   Copied from CRM #[id number]. Topic: Clinical - Red Word Triage >> May 04, 2024 10:43 AM Harlene ORN wrote: Red Word that prompted transfer to Nurse Triage: Requesting Antibiotic. Coughing up yellow phlegm. Difficulty breathing in between coughs. Reason for Disposition  Cough has been present for > 3 weeks  Answer Assessment - Initial Assessment Questions Patient requesting antibiotic and refill for cough medication [facility].   Patient was seen in office on 04/20/24 with same symptoms that are now worsening  1. ONSET: When did the cough begin?      Began about a month ago, but worsened over the past three days 2. SEVERITY: How bad is the cough today?      mild 3. SPUTUM: Describe the color of your sputum (e.g., none, dry cough; clear, white, yellow, green)     yellow 4. HEMOPTYSIS: Are you coughing up any blood? If Yes, ask: How much? (e.g., flecks, streaks, tablespoons, etc.)     denies 5. DIFFICULTY BREATHING: Are you having difficulty breathing? If Yes, ask: How bad is it? (e.g., mild, moderate, severe)      Mild difficulty breathing inbetween cough 6. FEVER: Do you have a fever? If Yes, ask: What is your temperature, how was it measured, and when did it start?     denies 7. CARDIAC HISTORY: Do you have any history of heart disease? (e.g., heart attack, congestive heart failure)       none 8. LUNG HISTORY: Do you have any history of lung disease?  (e.g., pulmonary embolus, asthma, emphysema)     COPD  10. OTHER SYMPTOMS: Do you have any other symptoms? (e.g., runny nose, wheezing, chest pain)       Right ankle swelling, bilateral leg tingling/shaking due to nerve stimulator in her back- will follow up with neurosurgery  Protocols used: Cough - Acute Productive-A-AH

## 2024-05-08 ENCOUNTER — Other Ambulatory Visit: Payer: Self-pay | Admitting: Family Medicine

## 2024-05-08 MED ORDER — PROMETHAZINE-DM 6.25-15 MG/5ML PO SYRP: 5.0000 mL | ORAL_SOLUTION | Freq: Four times a day (QID) | ORAL | 0 refills | Status: AC | PRN

## 2024-05-08 NOTE — Telephone Encounter (Signed)
 Copied from CRM #[id number]. Topic: Clinical - Medication Refill >> May 08, 2024 10:26 AM Zebedee SAUNDERS wrote: Medication: promethazine -dextromethorphan (PROMETHAZINE -DM) 6.25-15 MG/5ML syrup  Has the patient contacted their pharmacy? Yes (Agent: If no, request that the patient contact the pharmacy for the refill. If patient does not wish to contact the pharmacy document the reason why and proceed with request.) (Agent: If yes, when and what did the pharmacy advise?)  This is the patient's preferred pharmacy:   [facility] [address] [place] - [address] [place] [address] Phone: [phone] Fax: [phone]  Is this the correct pharmacy for this prescription? Yes If no, delete pharmacy and type the correct one.   Has the prescription been filled recently? Yes  Is the patient out of the medication? Yes  Has the patient been seen for an appointment in the last year OR does the patient have an upcoming appointment? Yes  Can we respond through MyChart? Yes  Agent: Please be advised that Rx refills may take up to 3 business days. We ask that you follow-up with your pharmacy.

## 2024-05-14 ENCOUNTER — Ambulatory Visit: Payer: 59

## 2024-05-14 ENCOUNTER — Ambulatory Visit

## 2024-05-14 VITALS — Ht 61.5 in | Wt 144.0 lb

## 2024-05-14 DIAGNOSIS — Z0000 Encounter for general adult medical examination without abnormal findings: Secondary | ICD-10-CM

## 2024-05-14 NOTE — Progress Notes (Signed)
 Chief Complaint  Patient presents with   Medicare Wellness     Subjective:   Connie Coffey is a 68 y.o. female who presents for a Medicare Annual Wellness Visit.  Visit info / Clinical Intake: Medicare Wellness Visit Type:: Subsequent Annual Wellness Visit Persons participating in visit and providing information:: patient Medicare Wellness Visit Mode:: Telephone If telephone:: video declined Since this visit was completed virtually, some vitals may be partially provided or unavailable. Missing vitals are due to the limitations of the virtual format.: Documented vitals are patient reported If Telephone or Video please confirm:: I connected with patient using audio/video enable telemedicine. I verified patient identity with two identifiers, discussed telehealth limitations, and patient agreed to proceed. Patient Location:: home Provider Location:: home office Interpreter Needed?: No Pre-visit prep was completed: yes AWV questionnaire completed by patient prior to visit?: no Living arrangements:: with family/others Patient's Overall Health Status Rating: good Typical amount of pain: some Does pain affect daily life?: (!) yes Are you currently prescribed opioids?: (!) yes  Dietary Habits and Nutritional Risks How many meals a day?: 3 Eats fruit and vegetables daily?: yes Most meals are obtained by: eating out; preparing own meals In the last 2 weeks, have you had any of the following?: none Diabetic:: no  Functional Status Activities of Daily Living (to include ambulation/medication): Independent Ambulation: Independent with device- listed below Home Assistive Devices/Equipment: Walker (specify Type); Cane Medication Administration: Independent Home Management (perform basic housework or laundry): Independent Manage your own finances?: yes Primary transportation is: driving Concerns about vision?: no *vision screening is required for WTM* Concerns about hearing?: (!) yes  (scheduled to see audiology soon for hearing evaluation) Uses hearing aids?: no  Fall Screening Falls in the past year?: 1 Number of falls in past year: 0 Was there an injury with Fall?: 0 Fall Risk Category Calculator: 1 Patient Fall Risk Level: Low Fall Risk  Fall Risk Patient at Risk for Falls Due to: Impaired mobility; History of fall(s); Impaired balance/gait Fall risk Follow up: Falls evaluation completed; Education provided; Falls prevention discussed  Home and Transportation Safety: All rugs have non-skid backing?: yes All stairs or steps have railings?: yes Grab bars in the bathtub or shower?: yes Have non-skid surface in bathtub or shower?: yes Good home lighting?: yes Regular seat belt use?: yes Hospital stays in the last year:: no  Cognitive Assessment Difficulty concentrating, remembering, or making decisions? : no Will 6CIT or Mini Cog be Completed: no 6CIT or Mini Cog Declined: patient alert, oriented, able to answer questions appropriately and recall recent events  Advance Directives (For Healthcare) Does Patient Have a Medical Advance Directive?: Yes Does patient want to make changes to medical advance directive?: Yes (MAU/Ambulatory/Procedural Areas - Information given) Type of Advance Directive: Healthcare Power of Attorney Copy of Healthcare Power of Attorney in Chart?: Yes - validated most recent copy scanned in chart (See row information)  Reviewed/Updated  Reviewed/Updated: Reviewed All (Medical, Surgical, Family, Medications, Allergies, Care Teams, Patient Goals)    Allergies (verified) Beta vulgaris, Penicillins, Cat dander, Celecoxib, Chantix [varenicline], Dog epithelium (canis lupus familiaris), Fish oil, Gramineae pollens, Molds & smuts, Nsaids, Other, Red beet powder, Tape, and Nicotine   Current Medications (verified) Outpatient Encounter Medications as of 05/14/2024  Medication Sig   albuterol  (VENTOLIN  HFA) 108 (90 Base) MCG/ACT inhaler  Inhale 2 puffs into the lungs every 6 (six) hours as needed for wheezing or shortness of breath.   alendronate (FOSAMAX) 70 MG tablet Take 70 mg  by mouth once a week.   ARIPiprazole  (ABILIFY ) 5 MG tablet Take 1 tablet (5 mg total) by mouth daily.   aspirin  EC 81 MG tablet Take 1 tablet (81 mg total) by mouth daily. Swallow whole.   atorvastatin  (LIPITOR) 80 MG tablet TAKE 1 TABLET BY MOUTH DAILY   Azelastine  HCl 137 MCG/SPRAY SOLN Place 2 sprays into the nose in the morning and at bedtime.   bethanechol  (URECHOLINE ) 25 MG tablet TAKE 1 TABLET BY MOUTH IN THE  MORNING AND AT BEDTIME   busPIRone  (BUSPAR ) 30 MG tablet TAKE 1 TABLET BY MOUTH TWICE A DAY   cetirizine  (ZYRTEC ) 10 MG tablet TAKE 1 TABLET BY MOUTH EVERYDAY AT BEDTIME   cyclobenzaprine  (FLEXERIL ) 10 MG tablet Take 1 tablet (10 mg total) by mouth 3 (three) times daily.   dicyclomine  (BENTYL ) 20 MG tablet Take 1 tablet (20 mg total) by mouth 4 (four) times daily.   doxycycline  (VIBRA -TABS) 100 MG tablet Take 1 tablet (100 mg total) by mouth 2 (two) times daily.   DULoxetine  (CYMBALTA ) 60 MG capsule Take 1 capsule (60 mg total) by mouth daily.   fenofibrate  160 MG tablet TAKE 1 TABLET BY MOUTH DAILY   fluticasone  (FLONASE ) 50 MCG/ACT nasal spray Place 1 spray into both nostrils daily.   furosemide  (LASIX ) 20 MG tablet TAKE 1 TABLET BY MOUTH DAILY   gabapentin  (NEURONTIN ) 600 MG tablet TAKE 1 TABLET BY MOUTH 4 TIMES  DAILY   hydrocortisone  (CORTEF ) 10 MG tablet Take 1 tablet (10 mg total) by mouth 2 (two) times daily.   hydrOXYzine  (VISTARIL ) 25 MG capsule TAKE 1 CAPSULE (25 MG TOTAL) BY MOUTH EVERY 8 (EIGHT) HOURS AS NEEDED FOR ANXIETY OR ITCHING.   levothyroxine  (SYNTHROID ) 50 MCG tablet Take 1 tablet (50 mcg total) by mouth daily.   lidocaine  (LIDODERM ) 5 % Place 2 patches onto the skin daily. Remove & Discard patch within 12 hours or as directed by MD   lubiprostone  (AMITIZA ) 8 MCG capsule Take 8 mcg by mouth every morning.    montelukast  (SINGULAIR ) 10 MG tablet Take 1 tablet (10 mg total) by mouth daily.   Multiple Vitamin (MULTI-VITAMINS) TABS Take 1 tablet by mouth daily.   oxyCODONE -acetaminophen  (PERCOCET) 10-325 MG tablet Take 1 tablet by mouth every 8 (eight) hours as needed for pain.   pantoprazole  (PROTONIX ) 40 MG tablet Take 1 tablet (40 mg total) by mouth 2 (two) times daily.   primidone  (MYSOLINE ) 50 MG tablet TAKE 1 TABLET BY MOUTH TWICE  DAILY   promethazine -dextromethorphan (PROMETHAZINE -DM) 6.25-15 MG/5ML syrup Take 5 mLs by mouth 4 (four) times daily as needed for cough.   Propylene Glycol (SYSTANE BALANCE) 0.6 % SOLN Place 1 drop into both eyes daily as needed (dry eyes).   Tiotropium Bromide Monohydrate  (SPIRIVA  RESPIMAT) 2.5 MCG/ACT AERS USE 1 INHALATION BY MOUTH  TWICE DAILY   Vitamin D , Ergocalciferol , (DRISDOL ) 1.25 MG (50000 UNIT) CAPS capsule Take 1 capsule (50,000 Units total) by mouth every Wednesday.   No facility-administered encounter medications on file as of 05/14/2024.    History: Past Medical History:  Diagnosis Date   Adrenal insufficiency    Anxiety    Arthritis    Carpal tunnel syndrome    Both hands, worse in right   Chronic constipation    COPD (chronic obstructive pulmonary disease) (HCC)    CVA (cerebral vascular accident) (HCC)    Depression    Facet syndrome    Fibromyalgia    GERD (gastroesophageal reflux disease)  Heart murmur    Hypercholesteremia    IBS (irritable bowel syndrome)    Ischemic stroke (HCC)    07/19/2004   Lumbar radiculopathy    Osteoporosis    Radiculopathy, lumbar region 05/02/2021   RLS (restless legs syndrome)    Spinal stenosis    Thyroid  disease    TMJ (temporomandibular joint disorder)    Trochanteric bursitis of both hips    Vascular malformation    spinal cord   Past Surgical History:  Procedure Laterality Date   APPENDECTOMY     CARPAL TUNNEL RELEASE Right 12/13/2017   surgery   CATARACT EXTRACTION Bilateral     CHOLECYSTECTOMY     COLONOSCOPY  05/06/2017   Colonic polyps status post polypectomy. Interan land external hemorrhoids.    ESOPHAGOGASTRODUODENOSCOPY  04/28/2007   Mild gastrtiis. Otherwise normal EGD.    HEMORROIDECTOMY     LAMINECTOMY AND MICRODISCECTOMY LUMBAR SPINE  01/03/2005   NECK SURGERY     x2    OTHER SURGICAL HISTORY  04/24/2004   anterior cervical discectomy and fusion at C5-C6   SPINAL CORD STIMULATOR INSERTION N/A 07/21/2021   Procedure: Permanant Spinal cord stimulator placement leads and battery under fluroscopy;  Surgeon: Darlis Deatrice RAMAN, MD;  Location: [facility] OR;  Service: Neurosurgery;  Laterality: N/A;   TONSILLECTOMY     TOTAL ABDOMINAL HYSTERECTOMY     Family History  Problem Relation Age of Onset   Supraventricular tachycardia Mother    Dementia Mother    Hyperlipidemia Mother    Hypertension Mother    Osteoarthritis Mother    Osteoporosis Mother    Throat cancer Father    Breast cancer Other    Colon cancer Neg Hx    Esophageal cancer Neg Hx    Social History   Occupational History   Occupation: Disabled  Tobacco Use   Smoking status: Every Day    Current packs/day: 0.50    Types: Cigarettes   Smokeless tobacco: Never  Vaping Use   Vaping status: Never Used  Substance and Sexual Activity   Alcohol use: Not Currently    Comment: quit 19 years   Drug use: Never   Sexual activity: Not Currently   Tobacco Counseling Ready to quit: Not Answered Counseling given: Not Answered  SDOH Screenings   Food Insecurity: No Food Insecurity (05/14/2024)  Housing: Low Risk (05/14/2024)  Transportation Needs: No Transportation Needs (05/14/2024)  Utilities: Not At Risk (05/14/2024)  Alcohol Screen: Low Risk (02/24/2024)  Depression (PHQ2-9): Medium Risk (05/14/2024)  Financial Resource Strain: Low Risk (05/14/2023)  Physical Activity: Inactive (05/14/2024)  Social Connections: Socially Isolated (05/14/2024)  Stress: Stress Concern Present (05/14/2024)   Tobacco Use: High Risk (05/14/2024)  Health Literacy: Adequate Health Literacy (05/14/2024)   See flowsheets for full screening details  Depression Screen PHQ 2 & 9 Depression Scale- Over the past 2 weeks, how often have you been bothered by any of the following problems? Little interest or pleasure in doing things: 0 Feeling down, depressed, or hopeless (PHQ Adolescent also includes...irritable): 0 PHQ-2 Total Score: 0 Trouble falling or staying asleep, or sleeping too much: 1 Feeling tired or having little energy: 2 Poor appetite or overeating (PHQ Adolescent also includes...weight loss): 1 Feeling bad about yourself - or that you are a failure or have let yourself or your family down: 1 Trouble concentrating on things, such as reading the newspaper or watching television (PHQ Adolescent also includes...like school work): 1 Moving or speaking so slowly that other people could have  noticed. Or the opposite - being so fidgety or restless that you have been moving around a lot more than usual: 0 Thoughts that you would be better off dead, or of hurting yourself in some way: 0 PHQ-9 Total Score: 6 If you checked off any problems, how difficult have these problems made it for you to do your work, take care of things at home, or get along with other people?: Not difficult at all  Depression Treatment Depression Interventions/Treatment : Currently on Treatment; Medication     Goals Addressed             This Visit's Progress    Maintain independence   On track            Objective:    Today's Vitals   05/14/24 0843  Weight: 144 lb (65.3 kg)  Height: 5' 1.5 (1.562 m)   Body mass index is 26.77 kg/m.  Hearing/Vision screen Hearing Screening - Comments:: Scheduled to see audiology soon for evaluation  Vision Screening - Comments:: Wears rx glasses - up to date with routine eye exams with Dr. Vannie  Immunizations and Health Maintenance Health Maintenance  Topic Date  Due   Zoster Vaccines- Shingrix (2 of 2) 10/10/2023   Lung Cancer Screening  09/09/2024   Mammogram  09/17/2024   COVID-19 Vaccine (6 - 2025-26 season) 10/18/2024   DTaP/Tdap/Td (3 - Td or Tdap) 03/17/2025   Bone Density Scan  04/10/2025   Medicare Annual Wellness (AWV)  05/14/2025   Colonoscopy  11/24/2033   Pneumococcal Vaccine: 50+ Years  Completed   Influenza Vaccine  Completed   Meningococcal B Vaccine  Aged Out        Assessment/Plan:  This is a routine wellness examination for [place].  Patient Care Team: Sherre Clapper, MD as PCP - General (Family Medicine) Charlanne Groom, MD as PCP - Gastroenterology (Gastroenterology) Joshua Alm Hamilton, MD as Consulting Physician (Neurosurgery) Marda General, MD as Consulting Physician (Urology) Ezzard Valaria LABOR, MD as Consulting Physician (Oncology) Vannie Elsie HERO, OD (Ophthalmology) Joshua Lionel Singh, PA-C (Internal Medicine)  I have personally reviewed and noted the following in the patients chart:   Medical and social history Use of alcohol, tobacco or illicit drugs  Current medications and supplements including opioid prescriptions. Functional ability and status Nutritional status Physical activity Advanced directives List of other physicians Hospitalizations, surgeries, and ER visits in previous 12 months Vitals Screenings to include cognitive, depression, and falls Referrals and appointments  No orders of the defined types were placed in this encounter.  In addition, I have reviewed and discussed with patient certain preventive protocols, quality metrics, and best practice recommendations. A written personalized care plan for preventive services as well as general preventive health recommendations were provided to patient.   Lavelle Charmaine Browner, LPN   [id number]   Return in 1 year (on 05/14/2025).  After Visit Summary: (MyChart) Due to this being a telephonic visit, the after visit summary with patients  personalized plan was offered to patient via MyChart   Nurse Notes: No voiced or noted concerns at this time

## 2024-05-14 NOTE — Patient Instructions (Signed)
 Connie Coffey,  Thank you for taking the time for your Medicare Wellness Visit. I appreciate your continued commitment to your health goals. Please review the care plan we discussed, and feel free to reach out if I can assist you further.  Please note that Annual Wellness Visits do not include a physical exam. Some assessments may be limited, especially if the visit was conducted virtually. If needed, we may recommend an in-person follow-up with your provider.  Ongoing Care Seeing your primary care provider every 3 to 6 months helps us  monitor your health and provide consistent, personalized care.   Referrals If a referral was made during today's visit and you haven't received any updates within two weeks, please contact the referred provider directly to check on the status.  Recommended Screenings:  Health Maintenance  Topic Date Due   Zoster (Shingles) Vaccine (2 of 2) 10/10/2023   Screening for Lung Cancer  09/09/2024   Breast Cancer Screening  09/17/2024   COVID-19 Vaccine (6 - 2025-26 season) 10/18/2024   DTaP/Tdap/Td vaccine (3 - Td or Tdap) 03/17/2025   Osteoporosis screening with Bone Density Scan  04/10/2025   Medicare Annual Wellness Visit  05/14/2025   Colon Cancer Screening  11/24/2033   Pneumococcal Vaccine for age over 93  Completed   Flu Shot  Completed   Meningitis B Vaccine  Aged Out       05/14/2024    9:03 AM  Advanced Directives  Does Patient Have a Medical Advance Directive? Yes  Type of Advance Directive Healthcare Power of Attorney  Does patient want to make changes to medical advance directive? Yes (MAU/Ambulatory/Procedural Areas - Information given)  Copy of Healthcare Power of Attorney in Chart? Yes - validated most recent copy scanned in chart (See row information)   Information on Advanced Care Planning can be found at [place]  Secretary of [facility] Advance Health Care Directives Advance Health Care Directives ([email or web address]   Vision: Annual vision  screenings are recommended for early detection of glaucoma, cataracts, and diabetic retinopathy. These exams can also reveal signs of chronic conditions such as diabetes and high blood pressure.  Dental: Annual dental screenings help detect early signs of oral cancer, gum disease, and other conditions linked to overall health, including heart disease and diabetes.  Please see the attached documents for additional preventive care recommendations.

## 2024-05-24 ENCOUNTER — Other Ambulatory Visit: Payer: Self-pay | Admitting: Family Medicine

## 2024-05-24 DIAGNOSIS — K219 Gastro-esophageal reflux disease without esophagitis: Secondary | ICD-10-CM

## 2024-06-08 ENCOUNTER — Ambulatory Visit (HOSPITAL_COMMUNITY)
Admission: RE | Admit: 2024-06-08 | Discharge: 2024-06-08 | Disposition: A | Discharge status: Home / Self Care | Source: Ambulatory Visit | Attending: Physician Assistant | Admitting: Physician Assistant

## 2024-06-08 DIAGNOSIS — M25511 Pain in right shoulder: Secondary | ICD-10-CM | POA: Diagnosis present

## 2024-06-08 DIAGNOSIS — G8929 Other chronic pain: Secondary | ICD-10-CM | POA: Diagnosis present

## 2024-06-10 ENCOUNTER — Ambulatory Visit: Payer: Self-pay | Admitting: Physician Assistant

## 2024-07-03 ENCOUNTER — Other Ambulatory Visit: Payer: Self-pay | Admitting: Family Medicine

## 2024-07-28 ENCOUNTER — Ambulatory Visit: Admitting: Family Medicine

## 2024-08-24 ENCOUNTER — Other Ambulatory Visit

## 2024-08-24 ENCOUNTER — Ambulatory Visit: Admitting: Oncology
